# Patient Record
Sex: Female | Born: 1945 | Race: White | Hispanic: No | Marital: Married | Smoking: Current every day smoker
Health system: Southern US, Community
[De-identification: ages and names within clinical notes are randomized; demographics above are authoritative.]

## PROBLEM LIST (undated history)

## (undated) DIAGNOSIS — C50919 Malignant neoplasm of unspecified site of unspecified female breast: Secondary | ICD-10-CM

## (undated) DIAGNOSIS — F419 Anxiety disorder, unspecified: Secondary | ICD-10-CM

## (undated) DIAGNOSIS — I10 Essential (primary) hypertension: Secondary | ICD-10-CM

## (undated) DIAGNOSIS — R0600 Dyspnea, unspecified: Secondary | ICD-10-CM

## (undated) DIAGNOSIS — J449 Chronic obstructive pulmonary disease, unspecified: Secondary | ICD-10-CM

## (undated) DIAGNOSIS — C801 Malignant (primary) neoplasm, unspecified: Secondary | ICD-10-CM

## (undated) DIAGNOSIS — G8929 Other chronic pain: Secondary | ICD-10-CM

## (undated) DIAGNOSIS — M545 Low back pain, unspecified: Secondary | ICD-10-CM

## (undated) DIAGNOSIS — N189 Chronic kidney disease, unspecified: Secondary | ICD-10-CM

## (undated) DIAGNOSIS — M1990 Unspecified osteoarthritis, unspecified site: Secondary | ICD-10-CM

## (undated) DIAGNOSIS — E039 Hypothyroidism, unspecified: Secondary | ICD-10-CM

## (undated) DIAGNOSIS — Z8489 Family history of other specified conditions: Secondary | ICD-10-CM

## (undated) DIAGNOSIS — C4490 Unspecified malignant neoplasm of skin, unspecified: Secondary | ICD-10-CM

## (undated) DIAGNOSIS — E7800 Pure hypercholesterolemia, unspecified: Secondary | ICD-10-CM

## (undated) DIAGNOSIS — K219 Gastro-esophageal reflux disease without esophagitis: Secondary | ICD-10-CM

## (undated) HISTORY — PX: REPLACEMENT TOTAL HIP W/  RESURFACING IMPLANTS: SUR1222

## (undated) HISTORY — PX: CARPAL TUNNEL RELEASE: SHX101

## (undated) HISTORY — PX: TUBAL LIGATION: SHX77

## (undated) HISTORY — PX: JOINT REPLACEMENT: SHX530

## (undated) HISTORY — PX: ABDOMINAL HERNIA REPAIR: SHX539

## (undated) HISTORY — PX: ABDOMINAL HYSTERECTOMY: SHX81

## (undated) HISTORY — PX: BACK SURGERY: SHX140

## (undated) HISTORY — PX: HERNIA REPAIR: SHX51

## (undated) HISTORY — PX: DILATION AND CURETTAGE OF UTERUS: SHX78

## (undated) HISTORY — PX: SKIN CANCER EXCISION: SHX779

---

## 1993-08-19 HISTORY — PX: BREAST EXCISIONAL BIOPSY: SUR124

## 2005-06-05 ENCOUNTER — Ambulatory Visit: Payer: Self-pay | Admitting: Unknown Physician Specialty

## 2005-10-14 ENCOUNTER — Ambulatory Visit: Payer: Self-pay | Admitting: Unknown Physician Specialty

## 2006-06-09 ENCOUNTER — Ambulatory Visit: Payer: Self-pay | Admitting: Unknown Physician Specialty

## 2006-07-10 ENCOUNTER — Other Ambulatory Visit: Payer: Self-pay

## 2006-07-10 ENCOUNTER — Emergency Department: Payer: Self-pay | Admitting: Emergency Medicine

## 2006-07-11 ENCOUNTER — Ambulatory Visit: Payer: Self-pay | Admitting: Emergency Medicine

## 2007-04-30 ENCOUNTER — Ambulatory Visit: Payer: Self-pay | Admitting: Unknown Physician Specialty

## 2007-06-15 ENCOUNTER — Ambulatory Visit: Payer: Self-pay | Admitting: Unknown Physician Specialty

## 2008-07-27 ENCOUNTER — Ambulatory Visit: Payer: Self-pay | Admitting: Unknown Physician Specialty

## 2009-01-18 ENCOUNTER — Ambulatory Visit: Payer: Self-pay | Admitting: Internal Medicine

## 2009-01-20 ENCOUNTER — Ambulatory Visit: Payer: Self-pay | Admitting: Unknown Physician Specialty

## 2009-01-31 ENCOUNTER — Ambulatory Visit: Payer: Self-pay | Admitting: Unknown Physician Specialty

## 2009-08-22 ENCOUNTER — Ambulatory Visit: Payer: Self-pay | Admitting: Unknown Physician Specialty

## 2010-03-30 ENCOUNTER — Ambulatory Visit: Payer: Self-pay | Admitting: General Practice

## 2010-04-06 ENCOUNTER — Ambulatory Visit: Payer: Self-pay | Admitting: General Practice

## 2010-08-19 DIAGNOSIS — C50919 Malignant neoplasm of unspecified site of unspecified female breast: Secondary | ICD-10-CM

## 2010-08-19 HISTORY — PX: TUMOR EXCISION: SHX421

## 2010-08-19 HISTORY — PX: BREAST BIOPSY: SHX20

## 2010-08-19 HISTORY — DX: Malignant neoplasm of unspecified site of unspecified female breast: C50.919

## 2010-08-28 ENCOUNTER — Ambulatory Visit: Payer: Self-pay | Admitting: Unknown Physician Specialty

## 2010-09-10 ENCOUNTER — Ambulatory Visit: Payer: Self-pay | Admitting: Unknown Physician Specialty

## 2010-12-25 ENCOUNTER — Ambulatory Visit: Payer: Self-pay | Admitting: Internal Medicine

## 2011-01-03 ENCOUNTER — Inpatient Hospital Stay: Payer: Self-pay | Admitting: Surgery

## 2011-01-29 ENCOUNTER — Ambulatory Visit: Payer: Self-pay | Admitting: Unknown Physician Specialty

## 2011-02-01 ENCOUNTER — Inpatient Hospital Stay: Payer: Self-pay | Admitting: Surgery

## 2011-02-19 ENCOUNTER — Ambulatory Visit: Payer: Self-pay | Admitting: Surgery

## 2011-02-25 ENCOUNTER — Ambulatory Visit: Payer: Self-pay | Admitting: Oncology

## 2011-02-26 ENCOUNTER — Ambulatory Visit: Payer: Self-pay | Admitting: Surgery

## 2011-02-26 LAB — CANCER ANTIGEN 27.29: CA 27.29: 54.6 U/mL — ABNORMAL HIGH (ref 0.0–38.6)

## 2011-03-04 ENCOUNTER — Other Ambulatory Visit: Payer: Self-pay | Admitting: Oncology

## 2011-03-04 DIAGNOSIS — C799 Secondary malignant neoplasm of unspecified site: Secondary | ICD-10-CM

## 2011-03-04 DIAGNOSIS — R928 Other abnormal and inconclusive findings on diagnostic imaging of breast: Secondary | ICD-10-CM

## 2011-03-04 DIAGNOSIS — C50919 Malignant neoplasm of unspecified site of unspecified female breast: Secondary | ICD-10-CM

## 2011-03-05 ENCOUNTER — Ambulatory Visit: Payer: Self-pay | Admitting: Oncology

## 2011-03-08 ENCOUNTER — Ambulatory Visit
Admission: RE | Admit: 2011-03-08 | Discharge: 2011-03-08 | Disposition: A | Discharge status: Home / Self Care | Payer: BC Managed Care – PPO | Source: Ambulatory Visit | Attending: Oncology | Admitting: Oncology

## 2011-03-08 DIAGNOSIS — C799 Secondary malignant neoplasm of unspecified site: Secondary | ICD-10-CM

## 2011-03-08 DIAGNOSIS — R928 Other abnormal and inconclusive findings on diagnostic imaging of breast: Secondary | ICD-10-CM

## 2011-03-08 MED ORDER — GADOBENATE DIMEGLUMINE 529 MG/ML IV SOLN
15.0000 mL | Freq: Once | INTRAVENOUS | Status: AC | PRN
  Administered 2011-03-08: 15 mL via INTRAVENOUS

## 2011-03-20 ENCOUNTER — Ambulatory Visit: Payer: Self-pay | Admitting: Oncology

## 2011-03-28 ENCOUNTER — Ambulatory Visit: Payer: Self-pay | Admitting: Surgery

## 2011-04-04 LAB — PATHOLOGY REPORT

## 2011-04-20 ENCOUNTER — Ambulatory Visit: Payer: Self-pay | Admitting: Oncology

## 2011-05-02 LAB — CANCER ANTIGEN 27.29: CA 27.29: 34.7 U/mL (ref 0.0–38.6)

## 2011-05-20 ENCOUNTER — Ambulatory Visit: Payer: Self-pay | Admitting: Oncology

## 2011-06-20 ENCOUNTER — Ambulatory Visit: Payer: Self-pay | Admitting: Oncology

## 2011-06-28 LAB — CANCER ANTIGEN 27.29: CA 27.29: 23.4 U/mL (ref 0.0–38.6)

## 2011-07-20 ENCOUNTER — Ambulatory Visit: Payer: Self-pay | Admitting: Oncology

## 2011-07-26 LAB — CANCER ANTIGEN 27.29: CA 27.29: 30.2 U/mL (ref 0.0–38.6)

## 2011-08-20 ENCOUNTER — Ambulatory Visit: Payer: Self-pay | Admitting: Oncology

## 2011-08-22 LAB — BASIC METABOLIC PANEL
Anion Gap: 8 (ref 7–16)
BUN: 19 mg/dL — ABNORMAL HIGH (ref 7–18)
Chloride: 103 mmol/L (ref 98–107)
Creatinine: 1.12 mg/dL (ref 0.60–1.30)
EGFR (African American): >60
EGFR (Non-African Amer.): 52 — ABNORMAL LOW
Glucose: 97 mg/dL (ref 65–99)
Osmolality: 282 (ref 275–301)
Sodium: 140 mmol/L (ref 136–145)

## 2011-08-23 LAB — CANCER ANTIGEN 27.29: CA 27.29: 35.3 U/mL (ref 0.0–38.6)

## 2011-09-19 LAB — BASIC METABOLIC PANEL
Co2: 31 mmol/L (ref 21–32)
Creatinine: 1.21 mg/dL (ref 0.60–1.30)
EGFR (African American): 58 — ABNORMAL LOW
EGFR (Non-African Amer.): 47 — ABNORMAL LOW
Sodium: 140 mmol/L (ref 136–145)

## 2011-09-20 ENCOUNTER — Ambulatory Visit: Payer: Self-pay | Admitting: Oncology

## 2011-09-20 LAB — CANCER ANTIGEN 27.29: CA 27.29: 21.4 U/mL (ref 0.0–38.6)

## 2011-10-17 LAB — BASIC METABOLIC PANEL
BUN: 21 mg/dL — ABNORMAL HIGH (ref 7–18)
Calcium, Total: 9.2 mg/dL (ref 8.5–10.1)
EGFR (African American): 55 — ABNORMAL LOW
EGFR (Non-African Amer.): 46 — ABNORMAL LOW
Glucose: 110 mg/dL — ABNORMAL HIGH (ref 65–99)
Osmolality: 287 (ref 275–301)
Potassium: 3.8 mmol/L (ref 3.5–5.1)
Sodium: 142 mmol/L (ref 136–145)

## 2011-10-18 ENCOUNTER — Ambulatory Visit: Payer: Self-pay | Admitting: Oncology

## 2011-11-14 LAB — BASIC METABOLIC PANEL
BUN: 27 mg/dL — ABNORMAL HIGH (ref 7–18)
Calcium, Total: 9.3 mg/dL (ref 8.5–10.1)
Co2: 26 mmol/L (ref 21–32)
EGFR (African American): 55 — ABNORMAL LOW
Glucose: 131 mg/dL — ABNORMAL HIGH (ref 65–99)

## 2011-11-18 ENCOUNTER — Ambulatory Visit: Payer: Self-pay | Admitting: Oncology

## 2011-11-18 LAB — CANCER ANTIGEN 27.29: CA 27.29: 23.5 U/mL (ref 0.0–38.6)

## 2011-12-12 LAB — BASIC METABOLIC PANEL
Anion Gap: 10 (ref 7–16)
BUN: 17 mg/dL (ref 7–18)
Calcium, Total: 9.3 mg/dL (ref 8.5–10.1)
Chloride: 99 mmol/L (ref 98–107)
Co2: 29 mmol/L (ref 21–32)
Creatinine: 0.95 mg/dL (ref 0.60–1.30)
EGFR (African American): >60
EGFR (Non-African Amer.): >60
Glucose: 114 mg/dL — ABNORMAL HIGH (ref 65–99)
Osmolality: 278 (ref 275–301)
Potassium: 3.8 mmol/L (ref 3.5–5.1)
Sodium: 138 mmol/L (ref 136–145)

## 2011-12-18 ENCOUNTER — Ambulatory Visit: Payer: Self-pay | Admitting: Oncology

## 2011-12-18 ENCOUNTER — Ambulatory Visit: Payer: Self-pay

## 2012-01-09 LAB — BASIC METABOLIC PANEL
Chloride: 101 mmol/L (ref 98–107)
Co2: 31 mmol/L (ref 21–32)
Creatinine: 1.12 mg/dL (ref 0.60–1.30)
EGFR (African American): 60 — ABNORMAL LOW
EGFR (Non-African Amer.): 52 — ABNORMAL LOW
Glucose: 98 mg/dL (ref 65–99)
Osmolality: 282 (ref 275–301)
Sodium: 139 mmol/L (ref 136–145)

## 2012-01-10 LAB — CANCER ANTIGEN 27.29: CA 27.29: 16.9 U/mL (ref 0.0–38.6)

## 2012-01-18 ENCOUNTER — Ambulatory Visit: Payer: Self-pay | Admitting: Oncology

## 2012-02-06 LAB — BASIC METABOLIC PANEL
BUN: 20 mg/dL — ABNORMAL HIGH (ref 7–18)
Chloride: 102 mmol/L (ref 98–107)
EGFR (African American): 59 — ABNORMAL LOW
Glucose: 91 mg/dL (ref 65–99)
Potassium: 4.1 mmol/L (ref 3.5–5.1)
Sodium: 138 mmol/L (ref 136–145)

## 2012-02-07 LAB — CANCER ANTIGEN 27.29: CA 27.29: 19.3 U/mL (ref 0.0–38.6)

## 2012-02-17 ENCOUNTER — Ambulatory Visit: Payer: Self-pay | Admitting: Oncology

## 2012-03-05 LAB — BASIC METABOLIC PANEL WITH GFR
Anion Gap: 8
BUN: 27 mg/dL — ABNORMAL HIGH
Calcium, Total: 9.4 mg/dL
Chloride: 101 mmol/L
Co2: 32 mmol/L
Creatinine: 1.35 mg/dL — ABNORMAL HIGH
EGFR (African American): 48 — ABNORMAL LOW
EGFR (Non-African Amer.): 41 — ABNORMAL LOW
Glucose: 142 mg/dL — ABNORMAL HIGH
Osmolality: 289
Potassium: 3.4 mmol/L — ABNORMAL LOW
Sodium: 141 mmol/L

## 2012-03-06 LAB — CANCER ANTIGEN 27.29: CA 27.29: 21.5 U/mL (ref 0.0–38.6)

## 2012-03-11 ENCOUNTER — Other Ambulatory Visit: Payer: Self-pay | Admitting: Oncology

## 2012-03-11 DIAGNOSIS — C50911 Malignant neoplasm of unspecified site of right female breast: Secondary | ICD-10-CM

## 2012-03-12 ENCOUNTER — Ambulatory Visit: Payer: Self-pay | Admitting: Surgery

## 2012-03-12 DIAGNOSIS — I10 Essential (primary) hypertension: Secondary | ICD-10-CM

## 2012-03-12 LAB — CBC WITH DIFFERENTIAL/PLATELET
Basophil #: 0 10*3/uL (ref 0.0–0.1)
Eosinophil #: 0.2 10*3/uL (ref 0.0–0.7)
Eosinophil %: 2.7 %
Lymphocyte #: 1.5 10*3/uL (ref 1.0–3.6)
MCH: 31.2 pg (ref 26.0–34.0)
MCHC: 33.1 g/dL (ref 32.0–36.0)
MCV: 94 fL (ref 80–100)
Monocyte #: 0.4 x10 3/mm (ref 0.2–0.9)
Neutrophil %: 64.1 %
Platelet: 231 10*3/uL (ref 150–440)
RBC: 3.89 10*6/uL (ref 3.80–5.20)
RDW: 13 % (ref 11.5–14.5)

## 2012-03-12 LAB — BASIC METABOLIC PANEL
Anion Gap: 7 (ref 7–16)
Chloride: 106 mmol/L (ref 98–107)
Co2: 31 mmol/L (ref 21–32)
Creatinine: 1.04 mg/dL (ref 0.60–1.30)
EGFR (African American): >60
Glucose: 97 mg/dL (ref 65–99)
Osmolality: 291 (ref 275–301)
Sodium: 144 mmol/L (ref 136–145)

## 2012-03-15 ENCOUNTER — Other Ambulatory Visit: Payer: BC Managed Care – PPO

## 2012-03-19 ENCOUNTER — Ambulatory Visit: Payer: Self-pay | Admitting: Oncology

## 2012-03-20 ENCOUNTER — Ambulatory Visit: Payer: Self-pay | Admitting: Surgery

## 2012-03-21 LAB — BASIC METABOLIC PANEL
Anion Gap: 7 (ref 7–16)
Calcium, Total: 8.3 mg/dL — ABNORMAL LOW (ref 8.5–10.1)
Chloride: 103 mmol/L (ref 98–107)
Co2: 28 mmol/L (ref 21–32)
Creatinine: 1.22 mg/dL (ref 0.60–1.30)
EGFR (African American): 53 — ABNORMAL LOW
EGFR (Non-African Amer.): 46 — ABNORMAL LOW
Glucose: 131 mg/dL — ABNORMAL HIGH (ref 65–99)
Sodium: 138 mmol/L (ref 136–145)

## 2012-03-21 LAB — CBC WITH DIFFERENTIAL/PLATELET
Basophil %: 0.3 %
Eosinophil #: 0 10*3/uL (ref 0.0–0.7)
Eosinophil %: 0 %
HCT: 33.1 % — ABNORMAL LOW (ref 35.0–47.0)
HGB: 11.2 g/dL — ABNORMAL LOW (ref 12.0–16.0)
Lymphocyte #: 1.1 10*3/uL (ref 1.0–3.6)
MCHC: 33.8 g/dL (ref 32.0–36.0)
MCV: 93 fL (ref 80–100)
Monocyte #: 0.8 x10 3/mm (ref 0.2–0.9)
Neutrophil #: 9.8 10*3/uL — ABNORMAL HIGH (ref 1.4–6.5)
Neutrophil %: 83 %
RDW: 13.4 % (ref 11.5–14.5)
WBC: 11.9 10*3/uL — ABNORMAL HIGH (ref 3.6–11.0)

## 2012-03-22 LAB — CBC WITH DIFFERENTIAL/PLATELET
Basophil %: 0.2 %
Eosinophil %: 1.4 %
HCT: 29.6 % — ABNORMAL LOW (ref 35.0–47.0)
HGB: 10.1 g/dL — ABNORMAL LOW (ref 12.0–16.0)
Lymphocyte %: 10.6 %
MCV: 94 fL (ref 80–100)
Monocyte %: 6.7 %
Neutrophil #: 8.4 10*3/uL — ABNORMAL HIGH (ref 1.4–6.5)
Neutrophil %: 81.1 %
RBC: 3.15 10*6/uL — ABNORMAL LOW (ref 3.80–5.20)
RDW: 13.6 % (ref 11.5–14.5)

## 2012-04-09 ENCOUNTER — Ambulatory Visit
Admission: RE | Admit: 2012-04-09 | Discharge: 2012-04-09 | Disposition: A | Discharge status: Home / Self Care | Payer: Medicare Other | Source: Ambulatory Visit | Attending: Oncology | Admitting: Oncology

## 2012-04-09 DIAGNOSIS — C50911 Malignant neoplasm of unspecified site of right female breast: Secondary | ICD-10-CM

## 2012-04-09 MED ORDER — GADOBENATE DIMEGLUMINE 529 MG/ML IV SOLN
7.0000 mL | Freq: Once | INTRAVENOUS | Status: AC | PRN
  Administered 2012-04-09: 7 mL via INTRAVENOUS

## 2012-04-30 ENCOUNTER — Ambulatory Visit: Payer: Self-pay | Admitting: Oncology

## 2012-04-30 LAB — BASIC METABOLIC PANEL
Anion Gap: 6 — ABNORMAL LOW (ref 7–16)
Calcium, Total: 9.2 mg/dL (ref 8.5–10.1)
Chloride: 102 mmol/L (ref 98–107)
Co2: 31 mmol/L (ref 21–32)
Osmolality: 281 (ref 275–301)
Sodium: 139 mmol/L (ref 136–145)

## 2012-05-19 ENCOUNTER — Ambulatory Visit: Payer: Self-pay | Admitting: Oncology

## 2012-05-28 LAB — BASIC METABOLIC PANEL
Calcium, Total: 9 mg/dL (ref 8.5–10.1)
Chloride: 102 mmol/L (ref 98–107)
Co2: 26 mmol/L (ref 21–32)
Creatinine: 1.32 mg/dL — ABNORMAL HIGH (ref 0.60–1.30)
EGFR (African American): 49 — ABNORMAL LOW
Glucose: 108 mg/dL — ABNORMAL HIGH (ref 65–99)
Sodium: 139 mmol/L (ref 136–145)

## 2012-05-29 LAB — CANCER ANTIGEN 27.29: CA 27.29: 22.4 U/mL (ref 0.0–38.6)

## 2012-06-19 ENCOUNTER — Ambulatory Visit: Payer: Self-pay | Admitting: Oncology

## 2012-06-25 LAB — BASIC METABOLIC PANEL
BUN: 19 mg/dL — ABNORMAL HIGH (ref 7–18)
Creatinine: 1.36 mg/dL — ABNORMAL HIGH (ref 0.60–1.30)
EGFR (African American): 47 — ABNORMAL LOW
EGFR (Non-African Amer.): 40 — ABNORMAL LOW
Osmolality: 277 (ref 275–301)
Potassium: 3.8 mmol/L (ref 3.5–5.1)
Sodium: 136 mmol/L (ref 136–145)

## 2012-07-19 ENCOUNTER — Ambulatory Visit: Payer: Self-pay | Admitting: Oncology

## 2012-07-23 LAB — BASIC METABOLIC PANEL
BUN: 19 mg/dL — ABNORMAL HIGH (ref 7–18)
Chloride: 102 mmol/L (ref 98–107)
Co2: 30 mmol/L (ref 21–32)
Creatinine: 1.33 mg/dL — ABNORMAL HIGH (ref 0.60–1.30)
EGFR (Non-African Amer.): 42 — ABNORMAL LOW
Osmolality: 282 (ref 275–301)
Potassium: 4 mmol/L (ref 3.5–5.1)
Sodium: 140 mmol/L (ref 136–145)

## 2012-07-24 LAB — CANCER ANTIGEN 27.29: CA 27.29: 24.5 U/mL (ref 0.0–38.6)

## 2012-08-19 ENCOUNTER — Ambulatory Visit: Payer: Self-pay | Admitting: Oncology

## 2012-08-19 HISTORY — PX: BREAST BIOPSY: SHX20

## 2012-08-20 LAB — BASIC METABOLIC PANEL
BUN: 22 mg/dL — ABNORMAL HIGH (ref 7–18)
Calcium, Total: 8.6 mg/dL (ref 8.5–10.1)
Chloride: 103 mmol/L (ref 98–107)
Creatinine: 1.31 mg/dL — ABNORMAL HIGH (ref 0.60–1.30)
EGFR (African American): 49 — ABNORMAL LOW
Glucose: 101 mg/dL — ABNORMAL HIGH (ref 65–99)
Potassium: 4 mmol/L (ref 3.5–5.1)

## 2012-08-21 LAB — CANCER ANTIGEN 27.29: CA 27.29: 20.6 U/mL (ref 0.0–38.6)

## 2012-09-17 LAB — BASIC METABOLIC PANEL
BUN: 23 mg/dL — ABNORMAL HIGH (ref 7–18)
Calcium, Total: 9.1 mg/dL (ref 8.5–10.1)
Chloride: 101 mmol/L (ref 98–107)
Creatinine: 1.34 mg/dL — ABNORMAL HIGH (ref 0.60–1.30)
EGFR (Non-African Amer.): 41 — ABNORMAL LOW

## 2012-09-19 ENCOUNTER — Ambulatory Visit: Payer: Self-pay | Admitting: Oncology

## 2012-10-15 LAB — BASIC METABOLIC PANEL
Anion Gap: 9 (ref 7–16)
BUN: 27 mg/dL — ABNORMAL HIGH (ref 7–18)
Calcium, Total: 9.3 mg/dL (ref 8.5–10.1)
Chloride: 102 mmol/L (ref 98–107)
Co2: 29 mmol/L (ref 21–32)
EGFR (African American): 44 — ABNORMAL LOW
EGFR (Non-African Amer.): 38 — ABNORMAL LOW
Potassium: 4.2 mmol/L (ref 3.5–5.1)
Sodium: 140 mmol/L (ref 136–145)

## 2012-10-16 LAB — CANCER ANTIGEN 27.29: CA 27.29: 18.7 U/mL (ref 0.0–38.6)

## 2012-10-17 ENCOUNTER — Ambulatory Visit: Payer: Self-pay | Admitting: Oncology

## 2012-11-12 LAB — BASIC METABOLIC PANEL
Anion Gap: 5 — ABNORMAL LOW (ref 7–16)
BUN: 25 mg/dL — ABNORMAL HIGH (ref 7–18)
Calcium, Total: 8.7 mg/dL (ref 8.5–10.1)
Co2: 32 mmol/L (ref 21–32)
Creatinine: 1.47 mg/dL — ABNORMAL HIGH (ref 0.60–1.30)
EGFR (Non-African Amer.): 37 — ABNORMAL LOW
Potassium: 3.7 mmol/L (ref 3.5–5.1)

## 2012-11-13 LAB — CANCER ANTIGEN 27.29: CA 27.29: 20.3 U/mL (ref 0.0–38.6)

## 2012-11-17 ENCOUNTER — Ambulatory Visit: Payer: Self-pay | Admitting: Oncology

## 2012-12-17 ENCOUNTER — Ambulatory Visit: Payer: Self-pay | Admitting: Oncology

## 2012-12-17 LAB — BASIC METABOLIC PANEL
Anion Gap: 11 (ref 7–16)
Chloride: 101 mmol/L (ref 98–107)
Co2: 29 mmol/L (ref 21–32)
EGFR (African American): 45 — ABNORMAL LOW
EGFR (Non-African Amer.): 39 — ABNORMAL LOW
Potassium: 3.6 mmol/L (ref 3.5–5.1)
Sodium: 141 mmol/L (ref 136–145)

## 2012-12-18 LAB — CANCER ANTIGEN 27.29: CA 27.29: 19.2 U/mL (ref 0.0–38.6)

## 2013-01-17 ENCOUNTER — Ambulatory Visit: Payer: Self-pay | Admitting: Oncology

## 2013-01-21 LAB — BASIC METABOLIC PANEL
Anion Gap: 11 (ref 7–16)
BUN: 28 mg/dL — ABNORMAL HIGH (ref 7–18)
Calcium, Total: 9.7 mg/dL (ref 8.5–10.1)
Co2: 28 mmol/L (ref 21–32)
EGFR (Non-African Amer.): 39 — ABNORMAL LOW
Osmolality: 282 (ref 275–301)
Potassium: 4.2 mmol/L (ref 3.5–5.1)
Sodium: 138 mmol/L (ref 136–145)

## 2013-01-22 LAB — CANCER ANTIGEN 27.29: CA 27.29: 25.6 U/mL (ref 0.0–38.6)

## 2013-02-16 ENCOUNTER — Ambulatory Visit: Payer: Self-pay | Admitting: Oncology

## 2013-02-18 LAB — BASIC METABOLIC PANEL
BUN: 25 mg/dL — ABNORMAL HIGH (ref 7–18)
Calcium, Total: 9.2 mg/dL (ref 8.5–10.1)
Chloride: 103 mmol/L (ref 98–107)
Co2: 31 mmol/L (ref 21–32)
EGFR (Non-African Amer.): 37 — ABNORMAL LOW
Glucose: 108 mg/dL — ABNORMAL HIGH (ref 65–99)
Osmolality: 286 (ref 275–301)
Potassium: 3.8 mmol/L (ref 3.5–5.1)

## 2013-03-19 ENCOUNTER — Ambulatory Visit: Payer: Self-pay | Admitting: Oncology

## 2013-05-03 ENCOUNTER — Ambulatory Visit: Payer: Self-pay | Admitting: Surgery

## 2013-05-18 ENCOUNTER — Ambulatory Visit: Payer: Self-pay | Admitting: Oncology

## 2013-05-18 LAB — BASIC METABOLIC PANEL
Anion Gap: 10 (ref 7–16)
BUN: 22 mg/dL — ABNORMAL HIGH (ref 7–18)
Calcium, Total: 8.6 mg/dL (ref 8.5–10.1)
Co2: 30 mmol/L (ref 21–32)
Creatinine: 1.36 mg/dL — ABNORMAL HIGH (ref 0.60–1.30)
EGFR (Non-African Amer.): 40 — ABNORMAL LOW
Glucose: 141 mg/dL — ABNORMAL HIGH (ref 65–99)
Sodium: 143 mmol/L (ref 136–145)

## 2013-05-19 ENCOUNTER — Ambulatory Visit: Payer: Self-pay | Admitting: Oncology

## 2013-08-17 ENCOUNTER — Ambulatory Visit: Payer: Self-pay | Admitting: Oncology

## 2013-08-17 LAB — BASIC METABOLIC PANEL
Anion Gap: 5 — ABNORMAL LOW (ref 7–16)
Calcium, Total: 9.2 mg/dL (ref 8.5–10.1)
Chloride: 101 mmol/L (ref 98–107)
EGFR (African American): 54 — ABNORMAL LOW
EGFR (Non-African Amer.): 46 — ABNORMAL LOW
Glucose: 96 mg/dL (ref 65–99)
Potassium: 3.9 mmol/L (ref 3.5–5.1)

## 2013-08-19 ENCOUNTER — Ambulatory Visit: Payer: Self-pay | Admitting: Oncology

## 2013-11-10 ENCOUNTER — Ambulatory Visit: Payer: Self-pay | Admitting: Oncology

## 2013-11-12 ENCOUNTER — Ambulatory Visit: Payer: Self-pay | Admitting: Oncology

## 2013-11-15 ENCOUNTER — Ambulatory Visit: Payer: Self-pay | Admitting: Oncology

## 2013-11-15 LAB — BASIC METABOLIC PANEL
ANION GAP: 11 (ref 7–16)
BUN: 18 mg/dL (ref 7–18)
CALCIUM: 9.5 mg/dL (ref 8.5–10.1)
CO2: 29 mmol/L (ref 21–32)
Chloride: 98 mmol/L (ref 98–107)
Creatinine: 1.34 mg/dL — ABNORMAL HIGH (ref 0.60–1.30)
EGFR (African American): 47 — ABNORMAL LOW
GFR CALC NON AF AMER: 41 — AB
GLUCOSE: 134 mg/dL — AB (ref 65–99)
Osmolality: 280 (ref 275–301)
Potassium: 3.5 mmol/L (ref 3.5–5.1)
SODIUM: 138 mmol/L (ref 136–145)

## 2013-11-16 LAB — CANCER ANTIGEN 27.29: CA 27.29: 23.2 U/mL (ref 0.0–38.6)

## 2013-11-17 ENCOUNTER — Ambulatory Visit: Payer: Self-pay | Admitting: Oncology

## 2014-01-26 DIAGNOSIS — J42 Unspecified chronic bronchitis: Secondary | ICD-10-CM | POA: Insufficient documentation

## 2014-02-15 ENCOUNTER — Ambulatory Visit: Payer: Self-pay | Admitting: Oncology

## 2014-02-16 ENCOUNTER — Ambulatory Visit: Payer: Self-pay | Admitting: Oncology

## 2014-02-16 LAB — CANCER ANTIGEN 27.29: CA 27.29: 24.5 U/mL (ref 0.0–38.6)

## 2014-05-19 ENCOUNTER — Ambulatory Visit: Payer: Self-pay | Admitting: Oncology

## 2014-05-20 LAB — CANCER ANTIGEN 27.29: CA 27.29: 21.2 U/mL (ref 0.0–38.6)

## 2014-05-30 DIAGNOSIS — Z780 Asymptomatic menopausal state: Secondary | ICD-10-CM | POA: Insufficient documentation

## 2014-05-30 DIAGNOSIS — I7300 Raynaud's syndrome without gangrene: Secondary | ICD-10-CM | POA: Insufficient documentation

## 2014-05-30 DIAGNOSIS — N951 Menopausal and female climacteric states: Secondary | ICD-10-CM | POA: Insufficient documentation

## 2014-05-30 DIAGNOSIS — N6019 Diffuse cystic mastopathy of unspecified breast: Secondary | ICD-10-CM | POA: Insufficient documentation

## 2014-06-19 ENCOUNTER — Ambulatory Visit: Payer: Self-pay | Admitting: Oncology

## 2014-07-26 ENCOUNTER — Ambulatory Visit: Payer: Self-pay | Admitting: Oncology

## 2014-08-19 HISTORY — PX: LAPAROSCOPIC CHOLECYSTECTOMY: SUR755

## 2014-09-21 ENCOUNTER — Ambulatory Visit: Payer: Self-pay | Admitting: Physician Assistant

## 2014-09-23 ENCOUNTER — Ambulatory Visit: Payer: Self-pay | Admitting: Physician Assistant

## 2014-10-25 ENCOUNTER — Ambulatory Visit: Payer: Self-pay | Admitting: Anesthesiology

## 2014-10-27 ENCOUNTER — Ambulatory Visit: Payer: Self-pay | Admitting: Surgery

## 2014-11-21 ENCOUNTER — Ambulatory Visit
Admit: 2014-11-21 | Disposition: A | Discharge status: Home / Self Care | Payer: Self-pay | Attending: Oncology | Admitting: Oncology

## 2014-11-22 ENCOUNTER — Ambulatory Visit
Admit: 2014-11-22 | Disposition: A | Discharge status: Home / Self Care | Payer: Self-pay | Attending: Oncology | Admitting: Oncology

## 2014-11-23 LAB — CANCER ANTIGEN 27.29: CA 27.29: 27.1 U/mL (ref 0.0–38.6)

## 2014-12-06 NOTE — Discharge Summary (Signed)
PATIENT NAME:  Sara David, Sara David MR#:  [id number] DATE OF BIRTH:  [birth year]  DATE OF ADMISSION:  03/20/2012 DATE OF DISCHARGE:  03/22/2012  BRIEF HISTORY: Ms. Dawna Jakes is a 69 year old woman with a large ventral hernia admitted for elective ventral hernia repair. After appropriate preoperative preparation and informed consent, she was taken to surgery on the morning of 03/20/2012 where she underwent ventral hernia repair using atrium C-Qur mesh and component mobilization procedure. JP drains were placed. She had significant pain control problems initially after surgery, difficulty getting up. Abdominal binder was utilized. Her diet was advanced over the 3rd and this morning she is tolerating a regular diet. Dressing was changed. Her wounds look good. There is no sign of infection. She does continue to have moderate JP drainage. Will discharge her home today to be followed in the office in 7 to 10 days' time to re-evaluate JP removal.   DISCHARGE MEDICATIONS:  1. Letrozole 2.5 mg p.o. daily.  2. Omeprazole 20 mg p.o. daily.  3. Singulair 10 mg p.o. daily.  4. Citalopram 20 mg p.o. daily.  5. Diovan 80 mg p.o. daily.  6. Hydrochlorothiazide 12.5 mg p.o. daily.  7. Synthroid 100 mcg p.o. daily.  8. MiraLAX one packet p.o. daily.  9. Pravastatin 40 mg p.o. daily.  10. Zyrtec 10 mg p.o. daily p.r.n.  11. Flonase 50 mcg two sprays daily p.r.n.  12. Advair Diskus 100 mcg/50 mcg inhaled once a day.  13. Ambien 10 mg p.o. at bedtime p.r.n.   ADDITIONAL MEDICATIONS:  1. NicoDerm 14 mg patch daily.  2. Percocet 5/325 p.o. q.6 hours p.r.n. pain.   FINAL DISCHARGE DIAGNOSIS: Ventral hernia.    SURGERY: Ventral hernia repair.   ____________________________ Micheline Maze, MD rle:drc D: 03/22/2012 10:05:34 ET T: 03/23/2012 10:30:25 ET JOB#: [id number]  cc: Micheline Maze, MD, <Dictator> Paulita Cradle, PA-C Rodena Goldmann MD ELECTRONICALLY SIGNED 03/26/2012 22:33

## 2014-12-06 NOTE — Op Note (Signed)
PATIENT NAME:  Sara David, Sara David MR#:  [id number] DATE OF BIRTH:  1946/05/31  DATE OF PROCEDURE:  03/20/2012  PREOPERATIVE DIAGNOSIS: Ventral hernia.   POSTOPERATIVE DIAGNOSIS: Ventral hernia.   OPERATION: Ventral hernia repair.   SURGEON: Rodena Goldmann, MD.   ANESTHESIA: General.   OPERATIVE PROCEDURE: With the patient in the supine position after the induction of appropriate general anesthesia, the patient's abdomen was prepped with ChloraPrep and draped with sterile towels. The previous skin incision was excised in the upper midline and carried down to the umbilicus. The incision was carried down through the subcutaneous tissue using Bovie electrocautery. Midline hernia sac was identified and opened the length of the skin incision after dissecting it back to its base in the fascia. There were multiple smaller defects in the superior portion of the incision. The sac was dissected back to its base in a circumferential fashion and then the fascia dissected back to the division laterally. The sac was excised at the edge of the fascial defect removing the sac completely. Multiple adhesions were taken down to expose free intraperitoneal space. A piece of Gore-Tex Dual Mesh Plus was brought to the table and inserted into the defect and the repair began. However, it was apparent that the mesh was not going to completely encompass the defect and so that piece of mesh was removed and replaced with a 20 x 25 cm piece of atrium C-Qur mesh. The mesh was placed in the appropriate position and sutured in place with vertical mattress sutures of 0 Prolene. The underlay was approximately 5 to 6 cm in circumferential fashion. The fascial layer was then closed over the mesh using 0 Prolene in an interrupted figure-of-eight fashion. 10 mm Jackson-Pratt drains were inserted into the dead space secured with 3-0 nylon. The skin was clipped. Sterile compressive dressing was applied as well as an abdominal binder. The patient was  returned to the recovery room having tolerated the procedure well. Sponge, instrument, and needle counts were correct x2 in the Operating Room.  ____________________________ Rodena Goldmann III, MD rle:ap D: 03/20/2012 11:06:05 ET               T: 03/20/2012 12:24:23 ET             JOB#: [id number] cc: Rodena Goldmann III, MD, <Dictator> Rodena Goldmann MD ELECTRONICALLY SIGNED 03/20/2012 18:42

## 2014-12-09 ENCOUNTER — Ambulatory Visit
Admit: 2014-12-09 | Disposition: A | Discharge status: Home / Self Care | Payer: Self-pay | Attending: Unknown Physician Specialty | Admitting: Unknown Physician Specialty

## 2014-12-12 LAB — SURGICAL PATHOLOGY

## 2014-12-18 NOTE — Op Note (Signed)
PATIENT NAME:  Sara David, Sara David MR#:  [id number] DATE OF BIRTH:  Oct 01, 1945  DATE OF PROCEDURE:  10/27/2014  PREOPERATIVE DIAGNOSIS: Chronic cholecystitis.   POSTOPERATIVE DIAGNOSIS: Chronic cholecystitis.  OPERATION: Robotic-assisted laparoscopic cholecystectomy.   SURGEON: Rodena Goldmann III, MD   ANESTHESIA: General.   OPERATIVE PROCEDURE: With the patient in the supine position after the induction of appropriate general anesthesia, the patient's abdomen was prepped with ChloraPrep and draped with sterile towels. The patient was placed in the head down, feet up position. A left lateral transverse incision was made and the abdomen was cannulated using the Optiview cannula. Intraperitoneal position was confirmed. CO2 was reinsufflated. That approach was taken because of the midline ventral hernia and the possibility of significant midline scarring. I was able to visualize the gallbladder. A left upper quadrant transverse incision was made just off the costal margin and a robot port inserted in that area. A right lower quadrant transverse incision was made and the robot camera port inserted. A right anterior iliac spine incision was made and another robot port inserted under direct vision and an assistant port inserted high on the abdominal wall. The robot was brought to the table, docked to the patient, instruments inserted under direct vision. I then moved to the console. The gallbladder was visualized. The instruments were able to reach the fundus without too much trouble. I was able to visualize the hepatoduodenal ligament. The cystic duct was visualized and carefully dissected free from the surrounding tissue. It was doubly clipped and divided. The artery was individually clipped and divided. The gallbladder was then dissected free of its bed and delivered using hook and cautery apparatus. Once the gallbladder was free, the robot was undocked. The gallbladder removed through the lateral 10 mm port under  direct vision. The area was copiously suction irrigated. All ports were withdrawn without difficulty. Skin incision was closed with 5-0 nylon. The area was infiltrated with 0.25% Marcaine for postoperative pain control. Sterile dressings were applied. The patient was returned to the recovery room, having tolerated the procedure well. Sponge, instrument, and needle counts were correct x 2 in the operating room.   ____________________________ Rodena Goldmann III, MD rle:ST D: 10/27/2014 14:11:44 ET T: 10/27/2014 23:11:29 ET JOB#: [id number]  cc: Rodena Goldmann III, MD, <Dictator> Rodena Goldmann MD ELECTRONICALLY SIGNED 10/28/2014 19:27

## 2014-12-21 ENCOUNTER — Other Ambulatory Visit: Payer: Self-pay | Admitting: Family Medicine

## 2014-12-21 DIAGNOSIS — Z1231 Encounter for screening mammogram for malignant neoplasm of breast: Secondary | ICD-10-CM

## 2015-01-10 ENCOUNTER — Ambulatory Visit: Payer: Self-pay | Attending: Family Medicine

## 2015-01-30 ENCOUNTER — Encounter: Payer: Self-pay | Admitting: Emergency Medicine

## 2015-01-30 ENCOUNTER — Inpatient Hospital Stay
Admission: EM | Admit: 2015-01-30 | Discharge: 2015-02-02 | DRG: 389 | Disposition: A | Discharge status: Home / Self Care | Payer: Medicare Other | Attending: Internal Medicine | Admitting: Internal Medicine

## 2015-01-30 ENCOUNTER — Emergency Department: Payer: Medicare Other

## 2015-01-30 DIAGNOSIS — H6090 Unspecified otitis externa, unspecified ear: Secondary | ICD-10-CM | POA: Diagnosis present

## 2015-01-30 DIAGNOSIS — Z9049 Acquired absence of other specified parts of digestive tract: Secondary | ICD-10-CM | POA: Diagnosis present

## 2015-01-30 DIAGNOSIS — K5660 Unspecified intestinal obstruction: Secondary | ICD-10-CM | POA: Diagnosis present

## 2015-01-30 DIAGNOSIS — E162 Hypoglycemia, unspecified: Secondary | ICD-10-CM | POA: Diagnosis present

## 2015-01-30 DIAGNOSIS — K589 Irritable bowel syndrome without diarrhea: Secondary | ICD-10-CM | POA: Diagnosis present

## 2015-01-30 DIAGNOSIS — I739 Peripheral vascular disease, unspecified: Secondary | ICD-10-CM | POA: Diagnosis present

## 2015-01-30 DIAGNOSIS — C50911 Malignant neoplasm of unspecified site of right female breast: Secondary | ICD-10-CM | POA: Diagnosis not present

## 2015-01-30 DIAGNOSIS — K56609 Unspecified intestinal obstruction, unspecified as to partial versus complete obstruction: Secondary | ICD-10-CM | POA: Diagnosis present

## 2015-01-30 DIAGNOSIS — E860 Dehydration: Secondary | ICD-10-CM | POA: Diagnosis present

## 2015-01-30 DIAGNOSIS — C50919 Malignant neoplasm of unspecified site of unspecified female breast: Secondary | ICD-10-CM | POA: Diagnosis not present

## 2015-01-30 DIAGNOSIS — F329 Major depressive disorder, single episode, unspecified: Secondary | ICD-10-CM | POA: Diagnosis present

## 2015-01-30 DIAGNOSIS — K5669 Other intestinal obstruction: Secondary | ICD-10-CM | POA: Diagnosis present

## 2015-01-30 DIAGNOSIS — E538 Deficiency of other specified B group vitamins: Secondary | ICD-10-CM | POA: Diagnosis present

## 2015-01-30 DIAGNOSIS — Z91040 Latex allergy status: Secondary | ICD-10-CM | POA: Diagnosis not present

## 2015-01-30 DIAGNOSIS — C784 Secondary malignant neoplasm of small intestine: Secondary | ICD-10-CM | POA: Diagnosis present

## 2015-01-30 DIAGNOSIS — M549 Dorsalgia, unspecified: Secondary | ICD-10-CM

## 2015-01-30 DIAGNOSIS — E039 Hypothyroidism, unspecified: Secondary | ICD-10-CM | POA: Diagnosis present

## 2015-01-30 DIAGNOSIS — J45909 Unspecified asthma, uncomplicated: Secondary | ICD-10-CM | POA: Diagnosis present

## 2015-01-30 DIAGNOSIS — R079 Chest pain, unspecified: Secondary | ICD-10-CM

## 2015-01-30 DIAGNOSIS — K219 Gastro-esophageal reflux disease without esophagitis: Secondary | ICD-10-CM | POA: Diagnosis present

## 2015-01-30 DIAGNOSIS — Z8379 Family history of other diseases of the digestive system: Secondary | ICD-10-CM

## 2015-01-30 DIAGNOSIS — Z888 Allergy status to other drugs, medicaments and biological substances status: Secondary | ICD-10-CM

## 2015-01-30 DIAGNOSIS — F17210 Nicotine dependence, cigarettes, uncomplicated: Secondary | ICD-10-CM | POA: Diagnosis present

## 2015-01-30 DIAGNOSIS — Z7951 Long term (current) use of inhaled steroids: Secondary | ICD-10-CM

## 2015-01-30 DIAGNOSIS — Z79899 Other long term (current) drug therapy: Secondary | ICD-10-CM

## 2015-01-30 DIAGNOSIS — Z4659 Encounter for fitting and adjustment of other gastrointestinal appliance and device: Secondary | ICD-10-CM

## 2015-01-30 DIAGNOSIS — E785 Hyperlipidemia, unspecified: Secondary | ICD-10-CM | POA: Diagnosis present

## 2015-01-30 DIAGNOSIS — I10 Essential (primary) hypertension: Secondary | ICD-10-CM | POA: Diagnosis present

## 2015-01-30 DIAGNOSIS — K565 Intestinal adhesions [bands] with obstruction (postprocedural) (postinfection): Secondary | ICD-10-CM | POA: Diagnosis not present

## 2015-01-30 DIAGNOSIS — Z9889 Other specified postprocedural states: Secondary | ICD-10-CM | POA: Diagnosis not present

## 2015-01-30 DIAGNOSIS — Z803 Family history of malignant neoplasm of breast: Secondary | ICD-10-CM

## 2015-01-30 HISTORY — DX: Malignant (primary) neoplasm, unspecified: C80.1

## 2015-01-30 HISTORY — DX: Essential (primary) hypertension: I10

## 2015-01-30 HISTORY — DX: Malignant neoplasm of unspecified site of unspecified female breast: C50.919

## 2015-01-30 LAB — CBC WITH DIFFERENTIAL/PLATELET
BASOS ABS: 0 10*3/uL (ref 0–0.1)
BASOS PCT: 0 %
EOS PCT: 1 %
Eosinophils Absolute: 0.1 10*3/uL (ref 0–0.7)
HEMATOCRIT: 42.3 % (ref 35.0–47.0)
Hemoglobin: 14.1 g/dL (ref 12.0–16.0)
Lymphocytes Relative: 18 %
Lymphs Abs: 1.8 10*3/uL (ref 1.0–3.6)
MCH: 31 pg (ref 26.0–34.0)
MCHC: 33.3 g/dL (ref 32.0–36.0)
MCV: 93.1 fL (ref 80.0–100.0)
MONO ABS: 0.6 10*3/uL (ref 0.2–0.9)
Monocytes Relative: 6 %
Neutro Abs: 7.9 10*3/uL — ABNORMAL HIGH (ref 1.4–6.5)
Neutrophils Relative %: 75 %
Platelets: 298 10*3/uL (ref 150–440)
RBC: 4.54 MIL/uL (ref 3.80–5.20)
RDW: 13.6 % (ref 11.5–14.5)
WBC: 10.4 10*3/uL (ref 3.6–11.0)

## 2015-01-30 LAB — COMPREHENSIVE METABOLIC PANEL
ALT: 18 U/L (ref 14–54)
AST: 24 U/L (ref 15–41)
Albumin: 4.7 g/dL (ref 3.5–5.0)
Alkaline Phosphatase: 80 U/L (ref 38–126)
Anion gap: 14 (ref 5–15)
BUN: 26 mg/dL — ABNORMAL HIGH (ref 6–20)
CALCIUM: 10.1 mg/dL (ref 8.9–10.3)
CO2: 24 mmol/L (ref 22–32)
CREATININE: 1.1 mg/dL — AB (ref 0.44–1.00)
Chloride: 98 mmol/L — ABNORMAL LOW (ref 101–111)
GFR calc Af Amer: 58 mL/min — ABNORMAL LOW (ref >60)
GFR calc non Af Amer: 50 mL/min — ABNORMAL LOW (ref >60)
Glucose, Bld: 103 mg/dL — ABNORMAL HIGH (ref 65–99)
Potassium: 3.9 mmol/L (ref 3.5–5.1)
Sodium: 136 mmol/L (ref 135–145)
Total Bilirubin: 0.6 mg/dL (ref 0.3–1.2)
Total Protein: 8.2 g/dL — ABNORMAL HIGH (ref 6.5–8.1)

## 2015-01-30 LAB — URINALYSIS COMPLETE WITH MICROSCOPIC (ARMC ONLY)
Bacteria, UA: NONE SEEN
Bilirubin Urine: NEGATIVE
Glucose, UA: NEGATIVE mg/dL
Hgb urine dipstick: NEGATIVE
Nitrite: NEGATIVE
PROTEIN: NEGATIVE mg/dL
Specific Gravity, Urine: 1.009 (ref 1.005–1.030)
pH: 5 (ref 5.0–8.0)

## 2015-01-30 LAB — TROPONIN I
Troponin I: <0.03 ng/mL (ref <0.031)
Troponin I: <0.03 ng/mL (ref <0.031)
Troponin I: <0.03 ng/mL (ref <0.031)

## 2015-01-30 LAB — LIPASE, BLOOD: LIPASE: 34 U/L (ref 22–51)

## 2015-01-30 MED ORDER — POTASSIUM CHLORIDE IN NACL 20-0.9 MEQ/L-% IV SOLN
INTRAVENOUS | Status: DC
  Administered 2015-01-30 – 2015-02-01 (×5): via INTRAVENOUS
  Filled 2015-01-30 (×9): qty 1000

## 2015-01-30 MED ORDER — FENTANYL CITRATE (PF) 100 MCG/2ML IJ SOLN
50.0000 ug | Freq: Once | INTRAMUSCULAR | Status: AC
  Administered 2015-01-30: 50 ug via INTRAVENOUS

## 2015-01-30 MED ORDER — HEPARIN SODIUM (PORCINE) 5000 UNIT/ML IJ SOLN
5000.0000 [IU] | Freq: Three times a day (TID) | INTRAMUSCULAR | Status: DC
  Administered 2015-01-30 – 2015-02-02 (×8): 5000 [IU] via SUBCUTANEOUS
  Filled 2015-01-30 (×8): qty 1

## 2015-01-30 MED ORDER — FENTANYL CITRATE (PF) 100 MCG/2ML IJ SOLN
INTRAMUSCULAR | Status: AC
  Filled 2015-01-30: qty 2

## 2015-01-30 MED ORDER — NICOTINE 21 MG/24HR TD PT24
21.0000 mg | MEDICATED_PATCH | Freq: Every day | TRANSDERMAL | Status: DC
  Administered 2015-01-30 – 2015-02-02 (×4): 21 mg via TRANSDERMAL
  Filled 2015-01-30 (×4): qty 1

## 2015-01-30 MED ORDER — FENTANYL CITRATE (PF) 100 MCG/2ML IJ SOLN
75.0000 ug | Freq: Once | INTRAMUSCULAR | Status: AC
  Administered 2015-01-30: 75 ug via INTRAVENOUS

## 2015-01-30 MED ORDER — LETROZOLE 2.5 MG PO TABS
2.5000 mg | ORAL_TABLET | Freq: Every day | ORAL | Status: DC
  Administered 2015-01-31 – 2015-02-02 (×3): 2.5 mg via ORAL
  Filled 2015-01-30 (×4): qty 1

## 2015-01-30 MED ORDER — ONDANSETRON HCL 4 MG/2ML IJ SOLN
INTRAMUSCULAR | Status: AC
  Administered 2015-01-30: 4 mg via INTRAVENOUS
  Filled 2015-01-30: qty 2

## 2015-01-30 MED ORDER — CYCLOBENZAPRINE HCL 10 MG PO TABS: 10.0000 mg | ORAL_TABLET | Freq: Three times a day (TID) | ORAL | Status: DC | PRN

## 2015-01-30 MED ORDER — ONDANSETRON HCL 4 MG/2ML IJ SOLN
4.0000 mg | Freq: Once | INTRAMUSCULAR | Status: AC
  Administered 2015-01-30: 4 mg via INTRAVENOUS

## 2015-01-30 MED ORDER — ZOLPIDEM TARTRATE 5 MG PO TABS
5.0000 mg | ORAL_TABLET | Freq: Every evening | ORAL | Status: DC | PRN
  Administered 2015-01-30 – 2015-02-01 (×3): 5 mg via ORAL
  Filled 2015-01-30 (×3): qty 1

## 2015-01-30 MED ORDER — CITALOPRAM HYDROBROMIDE 20 MG PO TABS
20.0000 mg | ORAL_TABLET | Freq: Every day | ORAL | Status: DC
  Administered 2015-01-31 – 2015-02-02 (×3): 20 mg via ORAL
  Filled 2015-01-30 (×4): qty 1

## 2015-01-30 MED ORDER — MORPHINE SULFATE 4 MG/ML IJ SOLN: 4.0000 mg | Freq: Once | INTRAMUSCULAR | Status: DC

## 2015-01-30 MED ORDER — GABAPENTIN 300 MG PO CAPS
300.0000 mg | ORAL_CAPSULE | Freq: Every day | ORAL | Status: DC
  Administered 2015-01-31 – 2015-02-02 (×3): 300 mg via ORAL
  Filled 2015-01-30 (×4): qty 1

## 2015-01-30 MED ORDER — ACETAMINOPHEN 650 MG RE SUPP: 650.0000 mg | Freq: Four times a day (QID) | RECTAL | Status: DC | PRN

## 2015-01-30 MED ORDER — ACETAMINOPHEN 325 MG PO TABS: 650.0000 mg | ORAL_TABLET | Freq: Four times a day (QID) | ORAL | Status: DC | PRN

## 2015-01-30 MED ORDER — ACETAMINOPHEN 500 MG PO TABS: 500.0000 mg | ORAL_TABLET | Freq: Four times a day (QID) | ORAL | Status: DC | PRN

## 2015-01-30 MED ORDER — LEVOTHYROXINE SODIUM 100 MCG PO TABS
100.0000 ug | ORAL_TABLET | Freq: Every day | ORAL | Status: DC
  Administered 2015-01-31 – 2015-02-02 (×3): 100 ug via ORAL
  Filled 2015-01-30 (×3): qty 1

## 2015-01-30 MED ORDER — ONDANSETRON HCL 4 MG/2ML IJ SOLN
4.0000 mg | Freq: Four times a day (QID) | INTRAMUSCULAR | Status: DC | PRN
  Administered 2015-01-30 – 2015-02-01 (×3): 4 mg via INTRAVENOUS
  Filled 2015-01-30 (×3): qty 2

## 2015-01-30 MED ORDER — MORPHINE SULFATE 2 MG/ML IJ SOLN
INTRAMUSCULAR | Status: AC
  Administered 2015-01-30: 4 mg via INTRAVENOUS
  Filled 2015-01-30: qty 2

## 2015-01-30 MED ORDER — PRAVASTATIN SODIUM 20 MG PO TABS
40.0000 mg | ORAL_TABLET | Freq: Every day | ORAL | Status: DC
Start: 2015-01-30 — End: 2015-02-02
  Administered 2015-01-30 – 2015-02-02 (×4): 40 mg via ORAL
  Filled 2015-01-30 (×4): qty 2

## 2015-01-30 MED ORDER — HYDROCODONE-ACETAMINOPHEN 5-325 MG PO TABS
1.0000 | ORAL_TABLET | ORAL | Status: DC | PRN
  Administered 2015-01-30 – 2015-01-31 (×4): 2 via ORAL
  Filled 2015-01-30 (×4): qty 2

## 2015-01-30 MED ORDER — NICOTINE 10 MG IN INHA: 1.0000 | RESPIRATORY_TRACT | Status: DC | PRN

## 2015-01-30 MED ORDER — MONTELUKAST SODIUM 10 MG PO TABS
10.0000 mg | ORAL_TABLET | Freq: Every day | ORAL | Status: DC
  Administered 2015-01-30 – 2015-02-02 (×4): 10 mg via ORAL
  Filled 2015-01-30 (×4): qty 1

## 2015-01-30 MED ORDER — MOMETASONE FURO-FORMOTEROL FUM 100-5 MCG/ACT IN AERO
2.0000 | INHALATION_SPRAY | Freq: Two times a day (BID) | RESPIRATORY_TRACT | Status: DC
  Administered 2015-01-31 – 2015-02-02 (×3): 2 via RESPIRATORY_TRACT
  Filled 2015-01-30: qty 8.8

## 2015-01-30 MED ORDER — FENTANYL CITRATE (PF) 100 MCG/2ML IJ SOLN
INTRAMUSCULAR | Status: AC
  Administered 2015-01-30: 50 ug via INTRAVENOUS
  Filled 2015-01-30: qty 2

## 2015-01-30 MED ORDER — CIPROFLOXACIN-DEXAMETHASONE 0.3-0.1 % OT SUSP
4.0000 [drp] | Freq: Two times a day (BID) | OTIC | Status: DC
  Administered 2015-01-31 – 2015-02-02 (×3): 4 [drp] via OTIC
  Filled 2015-01-30 (×2): qty 7.5

## 2015-01-30 MED ORDER — SODIUM CHLORIDE 0.9 % IV BOLUS (SEPSIS)
1000.0000 mL | Freq: Once | INTRAVENOUS | Status: AC
  Administered 2015-01-30: 1000 mL via INTRAVENOUS

## 2015-01-30 MED ORDER — FLUTICASONE PROPIONATE 50 MCG/ACT NA SUSP
2.0000 | Freq: Every day | NASAL | Status: DC | PRN
  Filled 2015-01-30: qty 16

## 2015-01-30 MED ORDER — IOHEXOL 350 MG/ML SOLN
100.0000 mL | Freq: Once | INTRAVENOUS | Status: AC | PRN
  Administered 2015-01-30: 100 mL via INTRAVENOUS

## 2015-01-30 MED ORDER — LORATADINE 10 MG PO TABS
10.0000 mg | ORAL_TABLET | Freq: Every day | ORAL | Status: DC
  Administered 2015-01-30 – 2015-02-02 (×4): 10 mg via ORAL
  Filled 2015-01-30 (×4): qty 1

## 2015-01-30 NOTE — Consult Note (Signed)
Patient ID: Sara David, female   DOB: [birth year], 69 y.o.   MRN: [id number]  HPI Sara David is a 69 y.o. female well-known to our service.  HPI  She is referred to the emergency room today for evaluation of new onset significant lower quadrant abdominal pain. Patient has a long history of abdominal problems dating back several years to a small bowel obstruction. She had small bowel symptoms at the time and a video capsule study was performed. The capsular lodged at the site of a bowel obstruction and the patient underwent laparotomy for bowel obstruction.  There was a mass at the site of this obstruction which turned out to be a metastatic breast carcinoma. Further workup identified a mass in her breast which also was a carcinoma. Because of the stage IV disease she underwent chemotherapy. The graft she developed another large abdominal hernia at which time she underwent repair with mesh. The procedure was uncomplicated.  She is currently on hormone medications only. 3 months ago she had abdominal discomfort which was difficult to characterize. Workup was largely unremarkable. She was noted to have a T7 metastatic nodule on workup but that tumor was not felt to be responsible for her current abdominal symptoms. HIDA scan at that time demonstrated 27% function versus a normal of 35% function. Ultrasound demonstrated sludge in the gallbladder. She was referred to our office to consider surgical intervention.  She underwent a robot-assisted lap cholecystectomy. The procedure was uncomplicated. However, pathology demonstrated metastatic breast cancer in the hepatoduodenal ligament area along the cystic duct. PET scan was performed following that study which did not demonstrate any other evidence for disease. She remains on hormone therapy.  This a.m. she awoke with significant bilateral lower quadrant pain radiating to her back and upper abdomen. The pain was crampy without any nausea or vomiting. She did  have a normal bowel movement. She denies any fever or chills. She presented to the emergency room for further evaluation.  Workup was largely unremarkable with exception of a CT scan which demonstrated some mild small bowel distention. There did not appear to be any clear cut obstruction. Her stomach was not enlarged or distended. No hepatic masses were identified and there was no other evidence of metastatic disease. She refuses nasogastric tube. The surgical service was consulted with regard to possible recurrent small bowel obstruction  Past Medical History  Diagnosis Date  . Hypertension   . Cancer   . Breast cancer     Past Surgical History  Procedure Laterality Date  . Back surgery    . Cholecystectomy      History reviewed. No pertinent family history.  Social History History  Substance Use Topics  . Smoking status: Current Every Day Smoker -- 0.50 packs/day    Types: Cigarettes  . Smokeless tobacco: Not on file  . Alcohol Use: No    Allergies  Allergen Reactions  . Erythromycin Diarrhea and Nausea And Vomiting  . Latex Rash  . Tape Itching and Rash  . Wellbutrin [Bupropion] Rash    Current Facility-Administered Medications  Medication Dose Route Frequency Provider Last Rate Last Dose  . 0.9 % NaCl with KCl 20 mEq/ L  infusion   Intravenous Continuous Theodoro Grist, MD      . acetaminophen (TYLENOL) suppository 650 mg  650 mg Rectal Q6H PRN Theodoro Grist, MD      . acetaminophen (TYLENOL) tablet 500-1,000 mg  500-1,000 mg Oral Q6H PRN Theodoro Grist, MD      .  ciprofloxacin-dexamethasone (CIPRODEX) 0.3-0.1 % otic suspension 4 drop  4 drop Left Ear BID Theodoro Grist, MD      . citalopram (CELEXA) tablet 20 mg  20 mg Oral Daily Theodoro Grist, MD      . cyclobenzaprine (FLEXERIL) tablet 10 mg  10 mg Oral Q8H PRN Theodoro Grist, MD      . fluticasone (FLONASE) 50 MCG/ACT nasal spray 2 spray  2 spray Each Nare Daily PRN Theodoro Grist, MD      . gabapentin (NEURONTIN)  capsule 300 mg  300 mg Oral Daily Theodoro Grist, MD      . heparin injection 5,000 Units  5,000 Units Subcutaneous 3 times per day Theodoro Grist, MD      . HYDROcodone-acetaminophen (NORCO/VICODIN) 5-325 MG per tablet 1-2 tablet  1-2 tablet Oral Q4H PRN Theodoro Grist, MD      . letrozole [facility]) tablet 2.5 mg  2.5 mg Oral Daily Theodoro Grist, MD      . Derrill Memo ON 01/31/2015] levothyroxine (SYNTHROID, LEVOTHROID) tablet 100 mcg  100 mcg Oral QAC breakfast Theodoro Grist, MD      . loratadine (CLARITIN) tablet 10 mg  10 mg Oral Daily Theodoro Grist, MD      . mometasone-formoterol (DULERA) 100-5 MCG/ACT inhaler 2 puff  2 puff Inhalation BID Theodoro Grist, MD      . montelukast (SINGULAIR) tablet 10 mg  10 mg Oral Daily Theodoro Grist, MD      . morphine 4 MG/ML injection 4 mg  4 mg Intravenous Once Ponciano Ort, MD   4 mg at 01/30/15 1327  . nicotine (NICODERM CQ - dosed in mg/24 hours) patch 21 mg  21 mg Transdermal Daily Theodoro Grist, MD      . nicotine (NICOTROL) 10 MG inhaler 1 continuous puffing  1 continuous puffing Inhalation PRN Theodoro Grist, MD      . pravastatin (PRAVACHOL) tablet 40 mg  40 mg Oral Daily Theodoro Grist, MD      . zolpidem (AMBIEN) tablet 5 mg  5 mg Oral QHS PRN Theodoro Grist, MD         Review of Systems A 10 point review of systems was asked and was negative except for the following positive findings: noted above   Physical Exam Blood pressure 130/54, pulse 72, temperature 98.2 F (36.8 C), temperature source Oral, resp. rate 20, height 5\' 4"  (1.626 m), weight 66.225 kg (146 lb), SpO2 100 %. CONSTITShe appears well with no evidence of any significant pain at the present time. Her last pain medicine was 6 hours ago.EYES: Pupils are equal, round, and reactive to light, Sclera are non-icteric. EARS, NOSE, MOUTH AND THROAT: The oropharynx is clear. The oral mucosa is pink and moist. Hearing is intact to voice. LYMPH NODES:  Lymph nodes in the neck are  normal. RESPIRATORY:  Lungs are clear. There is normal respiratory effort, with equal breath sounds bilaterally, and without pathologic use of accessory muscles. CARDIOVASCULAR: Heart is regular without murmurs, gallops, or rubs. GI: The abdomen is  soft, nontender, and nondistended. There are no palpable masses. There is no hepatosplenomegaly. There are normal bowel sounds in all quadrants. I cannot palpate any recurrent or persistent hernia. She has no rebound or guarding noted.  GU: Rectal deferred.   MUSCULOSKELETAL: Normal muscle strength and tone. No cyanosis or edema.   SKIN: Turgor is good and there are no pathologic skin lesions or ulcers. NEUROLOGIC: Motor and sensation is grossly normal. Cranial nerves are grossly intact. PSYCH:  Oriented to person, place and time. Affect is normal.  Data RevieweI have reviewed the patient's CT scan and her laboratory values.have personally reviewed the patient's imaging, laboratory findings and medical records.    Assessment    This woman has a difficult clinical presentation but I do not believe she has any evidence for active small bowel obstruction. She is hungry and would like to eat. She had normal bowel movement this morning and she's not vomiting.  She did have a lumbar microdiscectomy 3 weeks ago and could have some ileus related to that particular problem.     Plan    I do not see any indications for surgery the present time. Her CT scan which demonstrated the bowel mismatch in size was performed without oral contrast was very difficult to make any significant decisions with regard to her bowel or possible bowel obstruction. Her symptoms persist I would repeat her CT scan with oral contrast. We will continue to follow her but I anticipate that her symptoms are somewhat related to her stage IV breast carcinoma. I discussed the findings and plan with the patient and her husband.  Since she is not vomiting and has no significant nausea I  think it reasonable to consider starting a clear liquid diet to see how she does. She has refused nasogastric suction and should she vomits then she might be more encouraged to consider decompression.      Time spent with the patient was 50 minutes, with more than 50% of the time spent in face-to-face education, counseling and care coordination.     Dia Crawford III 01/30/2015, 8:22 PM

## 2015-01-30 NOTE — ED Notes (Signed)
Pt from [facility], severe lower abd pain , CBC results followed pt ,

## 2015-01-30 NOTE — Progress Notes (Signed)
Dr. Jannifer Franklin notified of patients c/o nausea and needing order clarification for cardiac monitoring ordered by Dr. Ether Griffins. New orders to be entered by Dr. Jannifer Franklin for zofran PRN and telemetry monitoring.

## 2015-01-30 NOTE — ED Notes (Signed)
Transported to ct 

## 2015-01-30 NOTE — ED Notes (Signed)
Nausea, no vomiting, no diarrhea, no dysuria

## 2015-01-30 NOTE — ED Notes (Signed)
MD at bedside. 

## 2015-01-30 NOTE — H&P (Signed)
[facility] NAME: Sara David    MR#:  [id number]  DATE OF BIRTH:  04-Sep-1945  DATE OF ADMISSION:  01/30/2015  PRIMARY CARE PHYSICIAN: Lorenza Evangelist, MD   REQUESTING/REFERRING PHYSICIAN: Dr. Robet Leu  CHIEF COMPLAINT:   Chief Complaint  Patient presents with  . Abdominal Pain    began lower abd, while waiting for triage moving up to epigastrum, radiating to back, dizzy    HISTORY OF PRESENT ILLNESS: Sara David  is a 69 y.o. 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 The patient she was doing well up until today at around 8:30 AM which is that he had an sharp spasmodic intermittent 9/10  By the intensity pain in the lower abdomen which radiated to upper abdomen accompanied by nausea , the patient had small bowel movement today which was normal . She has been also belching, CT scan of abdomen revealed early small bowel obstruction and hospitalist services were contacted for admission patient refuses NG tube placement.  Complains of feeling dehydrated.  PAST MEDICAL HISTORY:   Past Medical History  Diagnosis Date  . Hypertension   . Cancer   . Breast cancer     PAST SURGICAL HISTORY:  Past Surgical History  Procedure Laterality Date  . Back surgery    . Cholecystectomy      SOCIAL HISTORY:  History  Substance Use Topics  . Smoking status: Current Every Day Smoker -- 0.50 packs/day    Types: Cigarettes  . Smokeless tobacco: Not on file  . Alcohol Use: No    FAMILY HISTORY: No family history on file.  DRUG ALLERGIES:  Allergies  Allergen Reactions  . Erythromycin Diarrhea and Nausea And Vomiting  . Latex Rash  . Tape Itching and Rash  . Wellbutrin [Bupropion]  Rash    Review of Systems  Constitutional: Negative for fever, chills, weight loss and malaise/fatigue.  HENT: Positive for congestion and ear pain.   Eyes: Negative for blurred vision and double vision.  Respiratory: Negative for cough, sputum production, shortness of breath and wheezing.   Cardiovascular: Negative for chest pain, palpitations, orthopnea, leg swelling and PND.  Gastrointestinal: Positive for heartburn, nausea and abdominal pain. Negative for vomiting, diarrhea, constipation, blood in stool and melena.  Genitourinary: Negative for dysuria, urgency, frequency and hematuria.  Musculoskeletal: Positive for back pain. Negative for falls.  Skin: Negative for rash.  Neurological: Negative for dizziness and weakness.  Psychiatric/Behavioral: Negative for depression and memory loss. The patient is not nervous/anxious.     MEDICATIONS AT HOME:  Prior to Admission medications   Medication Sig Start Date End Date Taking? Authorizing Provider  acetaminophen (TYLENOL) 500 MG tablet Take 500-1,000 mg by mouth every 6 (six) hours as needed for mild pain or headache.   Yes Historical Provider, MD  cetirizine (ZYRTEC) 10 MG tablet Take 10 mg by mouth daily.   Yes Historical Provider, MD  citalopram (CELEXA) 20 MG tablet Take 20 mg by mouth daily.   Yes Historical Provider, MD  cyclobenzaprine (FLEXERIL) 10 MG tablet Take 10 mg by mouth every 8 (eight) hours as needed for muscle spasms.   Yes Historical Provider, MD  fluticasone (FLONASE) 50 MCG/ACT nasal spray Place 2 sprays into both  nostrils daily as needed for rhinitis.   Yes Historical Provider, MD  Fluticasone-Salmeterol (ADVAIR) 100-50 MCG/DOSE AEPB Inhale 1 puff into the lungs 2 (two) times daily.   Yes Historical Provider, MD  gabapentin (NEURONTIN) 300 MG capsule Take 300 mg by mouth daily.   Yes Historical Provider, MD  HYDROcodone-acetaminophen (NORCO/VICODIN) 5-325 MG per tablet Take 1-2 tablets by mouth every 4 (four) hours as  needed for moderate pain.   Yes Historical Provider, MD  letrozole (FEMARA) 2.5 MG tablet Take 2.5 mg by mouth daily.   Yes Historical Provider, MD  levothyroxine (SYNTHROID, LEVOTHROID) 100 MCG tablet Take 100 mcg by mouth daily.   Yes Historical Provider, MD  meloxicam (MOBIC) 7.5 MG tablet Take 7.5 mg by mouth 2 (two) times daily with a meal.   Yes Historical Provider, MD  montelukast (SINGULAIR) 10 MG tablet Take 10 mg by mouth daily.   Yes Historical Provider, MD  omeprazole (PRILOSEC) 20 MG capsule Take 20 mg by mouth daily.   Yes Historical Provider, MD  pravastatin (PRAVACHOL) 40 MG tablet Take 40 mg by mouth daily.   Yes Historical Provider, MD  valsartan-hydrochlorothiazide (DIOVAN-HCT) 80-12.5 MG per tablet Take 1 tablet by mouth daily.   Yes Historical Provider, MD  zolpidem (AMBIEN) 10 MG tablet Take 10 mg by mouth at bedtime as needed for sleep.   Yes Historical Provider, MD      PHYSICAL EXAMINATION:   VITAL SIGNS: Blood pressure 133/62, pulse 78, temperature 98 F (36.7 C), temperature source Oral, resp. rate 15, height 5\' 4"  (1.626 m), weight 70.308 kg (155 lb), SpO2 99 %.  GENERAL:  70 y.o.-year-old patient lying in the bed with no acute distress. Dry oral mucosa EYES: Pupils equal, round, reactive to light and accommodation. No scleral icterus. Extraocular muscles intact. Left tympanic membrane looks normal some cerumen was noted and discomfort on the placement of the otoscope.  HEENT: Head atraumatic, normocephalic. Oropharynx and nasopharynx clear.  NECK:  Supple, no jugular venous distention. No thyroid enlargement, no tenderness.  LUNGS: Somewhat diminished breath sounds bilaterally, no wheezing, rales,rhonchi or crepitation. No use of accessory muscles of respiration.  CARDIOVASCULAR: S1, S2 normal. No murmurs, rubs, or gallops.  ABDOMEN: Soft, tender diffusely mostly in the epigastric area as well as the right lower quadrant, mild distension. Bowel sounds diminished.  No organomegaly or mass.  EXTREMITIES: No pedal edema, cyanosis, or clubbing.  NEUROLOGIC: Cranial nerves II through XII are intact. Muscle strength 5/5 in all extremities. Sensation intact. Gait not checked.  PSYCHIATRIC: The patient is alert and oriented x 3.  SKIN: No obvious rash, lesion, or ulcer.   LABORATORY PANEL:   CBC  Recent Labs Lab 01/30/15 1321  WBC 10.4  HGB 14.1  HCT 42.3  PLT 298  MCV 93.1  MCH 31.0  MCHC 33.3  RDW 13.6  LYMPHSABS 1.8  MONOABS 0.6  EOSABS 0.1  BASOSABS 0.0   ------------------------------------------------------------------------------------------------------------------  Chemistries   Recent Labs Lab 01/30/15 1321  NA 136  K 3.9  CL 98*  CO2 24  GLUCOSE 103*  BUN 26*  CREATININE 1.10*  CALCIUM 10.1  AST 24  ALT 18  ALKPHOS 80  BILITOT 0.6   ------------------------------------------------------------------------------------------------------------------  Cardiac Enzymes No results for input(s): TROPONINI in the last 168 hours. ------------------------------------------------------------------------------------------------------------------  RADIOLOGY: Ct Angio Chest Aorta W/cm &/or Wo/cm  01/30/2015   CLINICAL DATA:  Low back and epigastric pain. History of back surgery, breast carcinoma, cholecystectomy.  EXAM: CT ANGIOGRAPHY CHEST, ABDOMEN AND PELVIS  TECHNIQUE: Multidetector CT imaging through the chest, abdomen and pelvis was performed using the standard protocol during bolus administration of intravenous contrast. Multiplanar reconstructed images and MIPs were obtained and reviewed to evaluate the vascular anatomy.  CONTRAST:  156mL OMNIPAQUE IOHEXOL 350 MG/ML SOLN  COMPARISON:  11/10/2013  FINDINGS: CTA CHEST  The noncontrast scout shows coronary and aortic calcifications. No hyperdense crescent or mediastinal hematoma. No pleural or pericardial effusion. No pneumothorax.  CTA via left arm injection. The SVC is patent.  Satisfactory opacification of pulmonary arteries noted, and there is no evidence of pulmonary emboli. Patent bilateral pulmonary veins. Adequate contrast opacification of the thoracic aorta with no evidence of dissection, aneurysm, or stenosis. There is patent proximal brachiocephalic arch anatomy without proximal stenosis. The left vertebral artery arises directly from the arch, an anatomic variant.  No hilar or mediastinal adenopathy. Mild pulmonary emphysematous changes. No nodule or focal airspace consolidation. Stable sclerotic lesions in multiple thoracic vertebral bodies and the sternum, as well as a partially lytic T7 lesion as before.  Review of the MIP images confirms the above findings.  CTA ABDOMEN AND PELVIS  Arterial findings:  Aorta: Scattered calcified plaque predominately in the infrarenal segment. Mildly ectatic infrarenal segment up to 2.6 cm transverse diameter. There is some eccentric mural thrombus with focal penetrating atheromatous ulcers posteriorly in the ectatic segment. No stenosis.  Celiac axis:         Patent  Superior mesenteric: Patent, classic distal branch anatomy.  Left renal:          Single with proximal bifurcation, patent  Right renal: Partially calcified ostial plaque over a length of less than 1 cm resulting in at least mild stenosis, patent distally.  Inferior mesenteric: Patent, arising from the ectatic segment  Left iliac: Heavy eccentric plaque through the common iliac, which is ectatic distally up to 14 mm diameter. External iliac is widely patent. Heavy atheromatous calcified plaque in the internal iliac.  Right iliac: Eccentric nonocclusive plaque through the common iliac and internal iliac. The external iliac is widely patent.  Venous findings: Dedicated venous phase imaging not obtained. Patent bilateral renal veins noted.  Review of the MIP images confirms the above findings.  Nonvascular findings: Unremarkable arterial phase evaluation of liver, spleen, adrenal  glands, kidneys, pancreas. Stomach is nondistended. There is mild distention of multiple mid abdominal small bowel loops. Distal small bowel is decompressed. No discrete transition zone is evident. Small bowel anastomotic staple line in the pelvis. Normal appendix. The colon is nondilated. Scattered sigmoid diverticula without adjacent inflammatory/edematous change. Urinary bladder physiologically distended. No ascites. No free air. Changes of ventral hernia repair with decrease in amount of fluid adjacent to the mesh since previous exam. Multiple scattered sclerotic lesions in the lumbar spine and pelvis are stable. Degenerative disc disease most marked L4-5 and L5-S1.  IMPRESSION: 1. Negative for acute PE or thoracic aortic dissection. 2. Multiple distended proximal and mid abdominal small bowel loops suggesting early/partial obstruction, without discrete transition point or etiology. 3. Multiple sclerotic osseous lesions, and mixed lytic/ sclerotic T7 lesion, stable since 11/10/2013. 4. Ectatic abdominal aorta at risk for aneurysm development. Recommend follow up by [place] in 5 years. This recommendation follows ACR consensus guidelines: White Paper of the ACR Incidental Findings Committee II on Vascular Findings. J Am Coll Radiol 2013; 10:789-794.   Electronically Signed   By: Lucrezia Europe M.D.   On: 01/30/2015 16:36   Ct Cta Abd/pel W/cm &/or W/o Cm  01/30/2015  CLINICAL DATA:  Low back and epigastric pain. History of back surgery, breast carcinoma, cholecystectomy.  EXAM: CT ANGIOGRAPHY CHEST, ABDOMEN AND PELVIS  TECHNIQUE: Multidetector CT imaging through the chest, abdomen and pelvis was performed using the standard protocol during bolus administration of intravenous contrast. Multiplanar reconstructed images and MIPs were obtained and reviewed to evaluate the vascular anatomy.  CONTRAST:  143mL OMNIPAQUE IOHEXOL 350 MG/ML SOLN  COMPARISON:  11/10/2013  FINDINGS: CTA CHEST  The noncontrast scout shows coronary  and aortic calcifications. No hyperdense crescent or mediastinal hematoma. No pleural or pericardial effusion. No pneumothorax.  CTA via left arm injection. The SVC is patent. Satisfactory opacification of pulmonary arteries noted, and there is no evidence of pulmonary emboli. Patent bilateral pulmonary veins. Adequate contrast opacification of the thoracic aorta with no evidence of dissection, aneurysm, or stenosis. There is patent proximal brachiocephalic arch anatomy without proximal stenosis. The left vertebral artery arises directly from the arch, an anatomic variant.  No hilar or mediastinal adenopathy. Mild pulmonary emphysematous changes. No nodule or focal airspace consolidation. Stable sclerotic lesions in multiple thoracic vertebral bodies and the sternum, as well as a partially lytic T7 lesion as before.  Review of the MIP images confirms the above findings.  CTA ABDOMEN AND PELVIS  Arterial findings:  Aorta: Scattered calcified plaque predominately in the infrarenal segment. Mildly ectatic infrarenal segment up to 2.6 cm transverse diameter. There is some eccentric mural thrombus with focal penetrating atheromatous ulcers posteriorly in the ectatic segment. No stenosis.  Celiac axis:         Patent  Superior mesenteric: Patent, classic distal branch anatomy.  Left renal:          Single with proximal bifurcation, patent  Right renal: Partially calcified ostial plaque over a length of less than 1 cm resulting in at least mild stenosis, patent distally.  Inferior mesenteric: Patent, arising from the ectatic segment  Left iliac: Heavy eccentric plaque through the common iliac, which is ectatic distally up to 14 mm diameter. External iliac is widely patent. Heavy atheromatous calcified plaque in the internal iliac.  Right iliac: Eccentric nonocclusive plaque through the common iliac and internal iliac. The external iliac is widely patent.  Venous findings: Dedicated venous phase imaging not obtained. Patent  bilateral renal veins noted.  Review of the MIP images confirms the above findings.  Nonvascular findings: Unremarkable arterial phase evaluation of liver, spleen, adrenal glands, kidneys, pancreas. Stomach is nondistended. There is mild distention of multiple mid abdominal small bowel loops. Distal small bowel is decompressed. No discrete transition zone is evident. Small bowel anastomotic staple line in the pelvis. Normal appendix. The colon is nondilated. Scattered sigmoid diverticula without adjacent inflammatory/edematous change. Urinary bladder physiologically distended. No ascites. No free air. Changes of ventral hernia repair with decrease in amount of fluid adjacent to the mesh since previous exam. Multiple scattered sclerotic lesions in the lumbar spine and pelvis are stable. Degenerative disc disease most marked L4-5 and L5-S1.  IMPRESSION: 1. Negative for acute PE or thoracic aortic dissection. 2. Multiple distended proximal and mid abdominal small bowel loops suggesting early/partial obstruction, without discrete transition point or etiology. 3. Multiple sclerotic osseous lesions, and mixed lytic/ sclerotic T7 lesion, stable since 11/10/2013. 4. Ectatic abdominal aorta at risk for aneurysm development. Recommend follow up by [place] in 5 years. This recommendation follows ACR consensus guidelines: White Paper of the ACR Incidental Findings Committee II on Vascular Findings. J Am Coll Radiol 2013; 10:789-794.   Electronically Signed  By: Lucrezia Europe M.D.   On: 01/30/2015 16:36    EKG: Orders placed or performed during the hospital encounter of 01/30/15  . ED EKG  . ED EKG   EKG done in the emergency room revealed a normal sinus rhythm at 75 beats per minute normal axis no acute ST-T changes  IMPRESSION AND PLAN:  Principal Problem:   Small bowel obstruction Active Problems:   Breast cancer   Back pain   B12 deficiency   Gastroesophageal reflux disease   IBS (irritable bowel syndrome)    Asthma   Hypothyroidism   FH: cholecystectomy 1. Partial small bowel obstruction and medication medical floor,  unfortunately she is refusing NG tube placement will continue IV fluids as well as keep her nothing by mouth and will get surgical consultation,  continue also PPIs 2. Dehydration continue IV fluids at high rate 3. Renal insufficiency likely due to some element of dehydration,  as above we'll continue IV fluids and follow kidney function in the morning 4. Tobacco abuse discusses dictation for approximately 4 minutes to complete replacement therapy will be initiated 5. External otitis sheet patient on ciprofloxacin drops 6 peripheral vascular disease discussed about tobacco issues patient may benefit from Plavix All the records are reviewed and case discussed with ED provider. Management plans discussed with the patient, family and they are in agreement.  CODE STATUS:  Full code  TOTAL TIME TAKING CARE OF THIS PATIENT: 60 minutes.    Theodoro Grist M.D on 01/30/2015 at 6:02 PM  Between 7am to 6pm - Pager - [phone] After 6pm go to [email or web address] - password [email or web address] [facility]  Office  [phone]  CC: Primary care physician; Lorenza Evangelist, MD

## 2015-01-30 NOTE — ED Provider Notes (Signed)
[facility] Emergency Department Provider Note   ____________________________________________  Time seen: Approximately 3:29 PM  I have reviewed the triage vital signs and the nursing notes.   HISTORY  Chief Complaint Abdominal Pain   HPI Nakshatra Klose is a 69 y.o. female with a history of metastatic breast cancer, hypertension, status post cholecystectomy 4-5 weeks ago, and status post lumbar discectomy 3 weeks ago who presents to the emergency department with onset of lower abdominal pain around 8:30 AM that now radiates to the epigastric area and posterior thoracic area between her shoulder blades. She notes she has had general malaise for the past 2 days with some nausea yesterday but did not have any pain until today. This morning she was already awake and had taken her regular medications and had some coffee when the pain began. She had also had one normal bowel movement prior to the onset of the pain. She presented to her primary care doctor, Boykin Reaper, at [facility] this morning and was sent to the emergency department for further evaluation. The pain has increased since she has been in the waiting room and is now severe.  The pain is constant and "hurting" with periods of sharp pain that worsened without provocation. Nothing seems to make the pain better or worse. It's unaffected by movement. It is associated with lightheadedness and some shortness of breath.  She has not had recent fevers or illness. Her cough is her baseline smoker's cough. She has not had anything to eat or drink after her coffee today.   Past Medical History  Diagnosis Date  . Hypertension   . Cancer   . Breast cancer     There are no active problems to display for this patient.   Past Surgical History  Procedure Laterality Date  . Back surgery    . 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 acetaminophen (TYLENOL) 500 MG tablet   Oral   Take 500-1,000 mg by mouth every 6 (six) hours as needed for mild pain or headache.         . cetirizine (ZYRTEC) 10 MG tablet   Oral   Take 10 mg by mouth daily.         . citalopram (CELEXA) 20 MG tablet   Oral   Take 20 mg by mouth daily.         . cyclobenzaprine (FLEXERIL) 10 MG tablet   Oral   Take 10 mg by mouth every 8 (eight) hours as needed for muscle spasms.         . fluticasone (FLONASE) 50 MCG/ACT nasal spray   Each Nare   Place 2 sprays into both nostrils daily as needed for rhinitis.         . Fluticasone-Salmeterol (ADVAIR) 100-50 MCG/DOSE AEPB   Inhalation   Inhale 1 puff into the lungs 2 (two) times daily.         Marland Kitchen gabapentin (NEURONTIN) 300 MG capsule   Oral   Take 300 mg by mouth daily.         Marland Kitchen HYDROcodone-acetaminophen (NORCO/VICODIN) 5-325 MG per tablet   Oral   Take 1-2 tablets by mouth every 4 (four) hours as needed for moderate pain.         Marland Kitchen letrozole (FEMARA) 2.5 MG  tablet   Oral   Take 2.5 mg by mouth daily.         Marland Kitchen levothyroxine (SYNTHROID, LEVOTHROID) 100 MCG tablet   Oral   Take 100 mcg by mouth daily.         . meloxicam (MOBIC) 7.5 MG tablet   Oral   Take 7.5 mg by mouth 2 (two) times daily with a meal.         . montelukast (SINGULAIR) 10 MG tablet   Oral   Take 10 mg by mouth daily.         Marland Kitchen omeprazole (PRILOSEC) 20 MG capsule   Oral   Take 20 mg by mouth daily.         . pravastatin (PRAVACHOL) 40 MG tablet   Oral   Take 40 mg by mouth daily.         . valsartan-hydrochlorothiazide (DIOVAN-HCT) 80-12.5 MG per tablet   Oral   Take 1 tablet by mouth daily.         Marland Kitchen zolpidem (AMBIEN) 10 MG tablet   Oral   Take 10 mg by mouth at bedtime as needed for sleep.           Allergies Erythromycin; Latex; Tape; and Wellbutrin  No family history  on file.  Social History History  Substance Use Topics  . Smoking status: Current Every Day Smoker -- 0.50 packs/day    Types: Cigarettes  . Smokeless tobacco: Not on file  . Alcohol Use: No   he lives with her husband and her 82 year old mother.  Review of Systems Constitutional: No fever/chills ENT: No URI Cardiovascular: Chest pain per History of present illness Respiratory: Shortness of breath per history of present illness Gastrointestinal: See history of present illness   Musculoskeletal: See history of present illness Skin: Negative for rash. Neurological: No new numbness or weakness Psychiatric:No SI/HI Endocrine:No hot/cold intolerance 10-point ROS otherwise negative.  ____________________________________________   PHYSICAL EXAM:  VITAL SIGNS: ED Triage Vitals  Enc Vitals Group     BP 01/30/15 1310 109/83 mmHg     Pulse Rate 01/30/15 1310 74     Resp 01/30/15 1310 20     Temp 01/30/15 1310 97.4 F (36.3 C)     Temp Source 01/30/15 1310 Oral     SpO2 01/30/15 1310 100 %     Weight 01/30/15 1310 155 lb (70.308 kg)     Height 01/30/15 1310 5\' 4"  (1.626 m)     Head Cir --      Peak Flow --      Pain Score 01/30/15 1310 10     Pain Loc --      Pain Edu? --      Excl. in [place]? --     Constitutional: Alert and oriented. Well appearing but appears somewhat uncomfortable. Eyes: Conjunctivae are normal. PERRL. EOMI. Head: Atraumatic. Nose: No congestion/rhinnorhea. Mouth/Throat: Mucous membranes are moist.  Oropharynx non-erythematous. Neck: No stridor.  Lymphatic: No cervical lymphadenopathy. Cardiovascular: Normal rate, regular rhythm. Grossly normal heart sounds.  Peripheral pulses 2+ B Respiratory: Normal respiratory effort.  No retractions. Lungs CTAB. Gastrointestinal: Soft. No distention. Moderate tenderness epigastric, right lower quadrant, left mid abdomen with no rebound or guarding. Normal bowel sounds. Musculoskeletal: No lower extremity  tenderness nor edema.  No calf TTP.  Neurologic:  Normal speech and language. No gross focal neurologic deficits are appreciated. Speech is normal. Skin:  Skin is warm, dry and intact. No rash noted. Scar over lumbar  spine well-healed Psychiatric: Mood and affect are normal. Speech and behavior are normal.  ____________________________________________   LABS (all labs ordered are listed, but only abnormal results are displayed)  Labs Reviewed  CBC WITH DIFFERENTIAL/PLATELET - Abnormal; Notable for the following:    Neutro Abs 7.9 (*)    All other components within normal limits  COMPREHENSIVE METABOLIC PANEL - Abnormal; Notable for the following:    Chloride 98 (*)    Glucose, Bld 103 (*)    BUN 26 (*)    Creatinine, Ser 1.10 (*)    Total Protein 8.2 (*)    GFR calc non Af Amer 50 (*)    GFR calc Af Amer 58 (*)    All other components within normal limits  URINALYSIS COMPLETEWITH MICROSCOPIC ([facility] ONLY) - Abnormal; Notable for the following:    Color, Urine YELLOW (*)    APPearance CLEAR (*)    Ketones, ur 1+ (*)    Leukocytes, UA 1+ (*)    Squamous Epithelial / LPF 0-5 (*)    All other components within normal limits  LIPASE, BLOOD  TROPONIN I   ____________________________________________  EKG   Date: 01/30/2015 1310  Rate: 72  Rhythm: normal sinus rhythm  QRS Axis: normal  Intervals: normal  ST/T Wave abnormalities: normal  Conduction Disutrbances: none  Narrative Interpretation: unremarkable   Date: 01/30/2015 1524, with epigastric pain  Rate: 75  Rhythm: normal sinus rhythm  QRS Axis: normal  Intervals: normal  ST/T Wave abnormalities: normal  Conduction Disutrbances: none  Narrative Interpretation: unremarkable  ____________________________________________  RADIOLOGY CT chest/abd/pelvis: 1. Negative for acute PE or thoracic aortic dissection. 2. Multiple distended proximal and mid abdominal small bowel loops suggesting early/partial obstruction,  without discrete transition point or etiology. 3. Multiple sclerotic osseous lesions, and mixed lytic/ sclerotic T7 lesion, stable since 11/10/2013. 4. Ectatic abdominal aorta at risk for aneurysm development. ____________________________________________   PROCEDURES  Procedure(s) performed: None  Critical Care performed: No  ____________________________________________   INITIAL IMPRESSION / ASSESSMENT AND PLAN / ED COURSE  Pertinent labs & imaging results that were available during my care of the patient were reviewed by me and considered in my medical decision making (see chart for details).  A patient with chest pain radiating to her posterior shoulder blades and epigastric and right lower quadrant pain with significant past medical history for metastatic breast cancer, SBO, recent lumbar discectomy and recent cholecystectomy. Broad differential diagnosis includes ACS, aortic dissection, AAA, appendicitis, SBO, metastatic cancer. We will proceed with CT chest abdomen and pelvis to further evaluate patient's symptoms. We will hydrate with IV normal saline as patient has had nothing by mouth and will be getting IV contrast.  ----------------------------------------- 5:22 PM on 01/30/2015 -----------------------------------------  Patient and daughter updated on CT results. Patient strongly opposed to NG tube. Current exam patient with normal bowel sounds, abdomen soft and nondistended with mild tenderness lower abdomen and epigastric without rebound or guarding. Pain had improved after last dose of fentanyl but is returning so I will repeat her dose. Discussed admission with Dr. Curt Bears, [facility] doc, who will admit for further management. ____________________________________________   FINAL CLINICAL IMPRESSION(S) / ED DIAGNOSES  Final diagnoses:  Chest pain   Early or partial SBO    Ponciano Ort, MD 01/30/15 1726

## 2015-01-31 ENCOUNTER — Inpatient Hospital Stay: Payer: Medicare Other

## 2015-01-31 DIAGNOSIS — K565 Intestinal adhesions [bands] with obstruction (postprocedural) (postinfection): Secondary | ICD-10-CM

## 2015-01-31 DIAGNOSIS — C50911 Malignant neoplasm of unspecified site of right female breast: Secondary | ICD-10-CM

## 2015-01-31 LAB — BASIC METABOLIC PANEL
Anion gap: 7 (ref 5–15)
BUN: 21 mg/dL — ABNORMAL HIGH (ref 6–20)
CALCIUM: 8.3 mg/dL — AB (ref 8.9–10.3)
CO2: 25 mmol/L (ref 22–32)
Chloride: 107 mmol/L (ref 101–111)
Creatinine, Ser: 1.07 mg/dL — ABNORMAL HIGH (ref 0.44–1.00)
GFR calc Af Amer: >60 mL/min (ref >60)
GFR, EST NON AFRICAN AMERICAN: 52 mL/min — AB (ref >60)
GLUCOSE: 86 mg/dL (ref 65–99)
Potassium: 4 mmol/L (ref 3.5–5.1)
SODIUM: 139 mmol/L (ref 135–145)

## 2015-01-31 LAB — CBC
HCT: 37.3 % (ref 35.0–47.0)
Hemoglobin: 12.5 g/dL (ref 12.0–16.0)
MCH: 31.7 pg (ref 26.0–34.0)
MCHC: 33.5 g/dL (ref 32.0–36.0)
MCV: 94.5 fL (ref 80.0–100.0)
PLATELETS: 221 10*3/uL (ref 150–440)
RBC: 3.95 MIL/uL (ref 3.80–5.20)
RDW: 13.6 % (ref 11.5–14.5)
WBC: 5.6 10*3/uL (ref 3.6–11.0)

## 2015-01-31 LAB — TROPONIN I

## 2015-01-31 MED ORDER — DIPHENHYDRAMINE HCL 25 MG PO CAPS
25.0000 mg | ORAL_CAPSULE | Freq: Once | ORAL | Status: AC
  Administered 2015-01-31: 25 mg via ORAL
  Filled 2015-01-31: qty 1

## 2015-01-31 MED ORDER — BISACODYL 10 MG RE SUPP
10.0000 mg | Freq: Once | RECTAL | Status: AC
  Administered 2015-01-31: 10 mg via RECTAL
  Filled 2015-01-31: qty 1

## 2015-01-31 NOTE — Progress Notes (Signed)
Called Dr Burt Knack ok to give ice chips

## 2015-01-31 NOTE — Progress Notes (Signed)
   01/31/15 1400  Clinical Encounter Type  Visited With Patient  Visit Type Initial  Spiritual Encounters  Spiritual Needs Prayer  Stress Factors  Patient Stress Factors Health changes  Provided pastoral presence and support to patient.  Patient asked me to pray for her silently.  Is having health issues currently and is not feeling very well today.  I told patient I hope she will feel better soon.  [place]

## 2015-01-31 NOTE — Progress Notes (Signed)
CC: Partial small bowel obstruction Subjective: Patient appears angry and agitated. She just had a nasogastric tube placed and is not sure why. She has not vomited, she has not passed any gas but did have a small bowel movement. She denies any abdominal pain at this point her biggest complaint is the presence of the nasogastric tube.  Objective: Vital signs in last 24 hours: Temp:  [97.4 F (36.3 C)-98.5 F (36.9 C)] 97.7 F (36.5 C) (06/14 1359) Pulse Rate:  [69-80] 73 (06/14 1359) Resp:  [11-20] 18 (06/14 1359) BP: (85-133)/(44-87) 122/57 mmHg (06/14 1359) SpO2:  [95 %-100 %] 100 % (06/14 1359) Weight:  [66.225 kg (146 lb)] 66.225 kg (146 lb) (06/13 1851) Last BM Date: 01/30/15  Intake/Output from previous day: 06/13 0701 - 06/14 0700 In: 1072.9 [I.V.:1072.9] Out: 300 [Urine:300] Intake/Output this shift: Total I/O In: -  Out: 225 [Urine:225]  Physical exam:  Angry appearing female patient with a new nasogastric tube that was left open in the bed without any output.  Abdomen is soft non-tympanitic nondistended and nontender,  Extremities without edema and calves are nontender.  Lab Results: CBC   Recent Labs  01/30/15 1321 01/31/15 0543  WBC 10.4 5.6  HGB 14.1 12.5  HCT 42.3 37.3  PLT 298 221   BMET  Recent Labs  01/30/15 1321 01/31/15 0543  NA 136 139  K 3.9 4.0  CL 98* 107  CO2 24 25  GLUCOSE 103* 86  BUN 26* 21*  CREATININE 1.10* 1.07*  CALCIUM 10.1 8.3*   PT/INR No results for input(s): LABPROT, INR in the last 72 hours. ABG No results for input(s): PHART, HCO3 in the last 72 hours.  Invalid input(s): PCO2, PO2  Studies/Results: Dg Abd 1 View  01/31/2015   CLINICAL DATA:  Nasogastric tube placement. Subsequent encounter. Small bowel obstruction.  EXAM: ABDOMEN - 1 VIEW  COMPARISON:  01/31/2015 at 0737 hours.  FINDINGS: Nasogastric tube is present with the tip in the gastric fundus. The proximal side port is at the gastroesophageal junction.  The tube could be advanced 2 were 3 cm to prevent gastroesophageal reflux through the side-port. Dilated loops of small bowel are present. No gross plain film evidence of free air.  IMPRESSION: 1. Nasogastric tube tip in the proximal fundus. 2. Side port of the nasogastric tube is at the gastroesophageal junction. The tube could be advanced between 2 cm and 3 cm for better positioning.   Electronically Signed   By: Dereck Ligas M.D.   On: 01/31/2015 15:20   Abd 1 View (kub)  01/31/2015   CLINICAL DATA:  Small bowel obstruction follow-up.  EXAM: ABDOMEN - 1 VIEW  COMPARISON:  01/30/2015 CT  FINDINGS: Dilated gas-filled small bowel loops are again noted.  A small amount of gas in the colon is present.  There has been little interval change since the prior study.  Degenerative changes in the lumbar spine and hips are again identified.  IMPRESSION: Unchanged distended small bowel loops suspicious for small bowel obstruction.   Electronically Signed   By: Margarette Canada M.D.   On: 01/31/2015 08:15   Ct Angio Chest Aorta W/cm &/or Wo/cm  01/30/2015   CLINICAL DATA:  Low back and epigastric pain. History of back surgery, breast carcinoma, cholecystectomy.  EXAM: CT ANGIOGRAPHY CHEST, ABDOMEN AND PELVIS  TECHNIQUE: Multidetector CT imaging through the chest, abdomen and pelvis was performed using the standard protocol during bolus administration of intravenous contrast. Multiplanar reconstructed images and MIPs were obtained  and reviewed to evaluate the vascular anatomy.  CONTRAST:  116mL OMNIPAQUE IOHEXOL 350 MG/ML SOLN  COMPARISON:  11/10/2013  FINDINGS: CTA CHEST  The noncontrast scout shows coronary and aortic calcifications. No hyperdense crescent or mediastinal hematoma. No pleural or pericardial effusion. No pneumothorax.  CTA via left arm injection. The SVC is patent. Satisfactory opacification of pulmonary arteries noted, and there is no evidence of pulmonary emboli. Patent bilateral pulmonary veins. Adequate  contrast opacification of the thoracic aorta with no evidence of dissection, aneurysm, or stenosis. There is patent proximal brachiocephalic arch anatomy without proximal stenosis. The left vertebral artery arises directly from the arch, an anatomic variant.  No hilar or mediastinal adenopathy. Mild pulmonary emphysematous changes. No nodule or focal airspace consolidation. Stable sclerotic lesions in multiple thoracic vertebral bodies and the sternum, as well as a partially lytic T7 lesion as before.  Review of the MIP images confirms the above findings.  CTA ABDOMEN AND PELVIS  Arterial findings:  Aorta: Scattered calcified plaque predominately in the infrarenal segment. Mildly ectatic infrarenal segment up to 2.6 cm transverse diameter. There is some eccentric mural thrombus with focal penetrating atheromatous ulcers posteriorly in the ectatic segment. No stenosis.  Celiac axis:         Patent  Superior mesenteric: Patent, classic distal branch anatomy.  Left renal:          Single with proximal bifurcation, patent  Right renal: Partially calcified ostial plaque over a length of less than 1 cm resulting in at least mild stenosis, patent distally.  Inferior mesenteric: Patent, arising from the ectatic segment  Left iliac: Heavy eccentric plaque through the common iliac, which is ectatic distally up to 14 mm diameter. External iliac is widely patent. Heavy atheromatous calcified plaque in the internal iliac.  Right iliac: Eccentric nonocclusive plaque through the common iliac and internal iliac. The external iliac is widely patent.  Venous findings: Dedicated venous phase imaging not obtained. Patent bilateral renal veins noted.  Review of the MIP images confirms the above findings.  Nonvascular findings: Unremarkable arterial phase evaluation of liver, spleen, adrenal glands, kidneys, pancreas. Stomach is nondistended. There is mild distention of multiple mid abdominal small bowel loops. Distal small bowel is  decompressed. No discrete transition zone is evident. Small bowel anastomotic staple line in the pelvis. Normal appendix. The colon is nondilated. Scattered sigmoid diverticula without adjacent inflammatory/edematous change. Urinary bladder physiologically distended. No ascites. No free air. Changes of ventral hernia repair with decrease in amount of fluid adjacent to the mesh since previous exam. Multiple scattered sclerotic lesions in the lumbar spine and pelvis are stable. Degenerative disc disease most marked L4-5 and L5-S1.  IMPRESSION: 1. Negative for acute PE or thoracic aortic dissection. 2. Multiple distended proximal and mid abdominal small bowel loops suggesting early/partial obstruction, without discrete transition point or etiology. 3. Multiple sclerotic osseous lesions, and mixed lytic/ sclerotic T7 lesion, stable since 11/10/2013. 4. Ectatic abdominal aorta at risk for aneurysm development. Recommend follow up by [place] in 5 years. This recommendation follows ACR consensus guidelines: White Paper of the ACR Incidental Findings Committee II on Vascular Findings. J Am Coll Radiol 2013; 10:789-794.   Electronically Signed   By: Lucrezia Europe M.D.   On: 01/30/2015 16:36   Ct Cta Abd/pel W/cm &/or W/o Cm  01/30/2015   CLINICAL DATA:  Low back and epigastric pain. History of back surgery, breast carcinoma, cholecystectomy.  EXAM: CT ANGIOGRAPHY CHEST, ABDOMEN AND PELVIS  TECHNIQUE: Multidetector CT imaging through  the chest, abdomen and pelvis was performed using the standard protocol during bolus administration of intravenous contrast. Multiplanar reconstructed images and MIPs were obtained and reviewed to evaluate the vascular anatomy.  CONTRAST:  163mL OMNIPAQUE IOHEXOL 350 MG/ML SOLN  COMPARISON:  11/10/2013  FINDINGS: CTA CHEST  The noncontrast scout shows coronary and aortic calcifications. No hyperdense crescent or mediastinal hematoma. No pleural or pericardial effusion. No pneumothorax.  CTA via left  arm injection. The SVC is patent. Satisfactory opacification of pulmonary arteries noted, and there is no evidence of pulmonary emboli. Patent bilateral pulmonary veins. Adequate contrast opacification of the thoracic aorta with no evidence of dissection, aneurysm, or stenosis. There is patent proximal brachiocephalic arch anatomy without proximal stenosis. The left vertebral artery arises directly from the arch, an anatomic variant.  No hilar or mediastinal adenopathy. Mild pulmonary emphysematous changes. No nodule or focal airspace consolidation. Stable sclerotic lesions in multiple thoracic vertebral bodies and the sternum, as well as a partially lytic T7 lesion as before.  Review of the MIP images confirms the above findings.  CTA ABDOMEN AND PELVIS  Arterial findings:  Aorta: Scattered calcified plaque predominately in the infrarenal segment. Mildly ectatic infrarenal segment up to 2.6 cm transverse diameter. There is some eccentric mural thrombus with focal penetrating atheromatous ulcers posteriorly in the ectatic segment. No stenosis.  Celiac axis:         Patent  Superior mesenteric: Patent, classic distal branch anatomy.  Left renal:          Single with proximal bifurcation, patent  Right renal: Partially calcified ostial plaque over a length of less than 1 cm resulting in at least mild stenosis, patent distally.  Inferior mesenteric: Patent, arising from the ectatic segment  Left iliac: Heavy eccentric plaque through the common iliac, which is ectatic distally up to 14 mm diameter. External iliac is widely patent. Heavy atheromatous calcified plaque in the internal iliac.  Right iliac: Eccentric nonocclusive plaque through the common iliac and internal iliac. The external iliac is widely patent.  Venous findings: Dedicated venous phase imaging not obtained. Patent bilateral renal veins noted.  Review of the MIP images confirms the above findings.  Nonvascular findings: Unremarkable arterial phase  evaluation of liver, spleen, adrenal glands, kidneys, pancreas. Stomach is nondistended. There is mild distention of multiple mid abdominal small bowel loops. Distal small bowel is decompressed. No discrete transition zone is evident. Small bowel anastomotic staple line in the pelvis. Normal appendix. The colon is nondilated. Scattered sigmoid diverticula without adjacent inflammatory/edematous change. Urinary bladder physiologically distended. No ascites. No free air. Changes of ventral hernia repair with decrease in amount of fluid adjacent to the mesh since previous exam. Multiple scattered sclerotic lesions in the lumbar spine and pelvis are stable. Degenerative disc disease most marked L4-5 and L5-S1.  IMPRESSION: 1. Negative for acute PE or thoracic aortic dissection. 2. Multiple distended proximal and mid abdominal small bowel loops suggesting early/partial obstruction, without discrete transition point or etiology. 3. Multiple sclerotic osseous lesions, and mixed lytic/ sclerotic T7 lesion, stable since 11/10/2013. 4. Ectatic abdominal aorta at risk for aneurysm development. Recommend follow up by [place] in 5 years. This recommendation follows ACR consensus guidelines: White Paper of the ACR Incidental Findings Committee II on Vascular Findings. J Am Coll Radiol 2013; 10:789-794.   Electronically Signed   By: Lucrezia Europe M.D.   On: 01/30/2015 16:36    Anti-infectives: Anti-infectives    None      Assessment/Plan:  Patient admitted  with partial small bowel obstruction on abdominal films my personal review of the abdominal films, suggest that she has gas in her right colon and transverse colon. She has not vomited but had a nasogastric tube placed for unclear reasons. At this point I would leave it in place and repeat films tomorrow morning and possibly use a Dulcolax suppository today.  Florene Glen, MD, FACS  01/31/2015

## 2015-01-31 NOTE — Plan of Care (Signed)
Problem: Discharge Progression Outcomes Goal: Other Discharge Outcomes/Goals Outcome: Progressing Pain c/o lower ABD pain well controlled with prn pain meds  Pts c/o nausea x1 relieved with prn Zofran  VSS Paitent remains NPO except for ice chips and sips of water with meds Patient is a standby assist to the BR and has one moderate formed stool today

## 2015-01-31 NOTE — Progress Notes (Signed)
Called Dr Benjie Karvonen about results of NG tube placement x-ray, she said to advance tube 3cm more, no need to re-xray and the hook to low continuous suction

## 2015-01-31 NOTE — Progress Notes (Signed)
Dr. Reece Levy notified of patients c/o itching. New order received for Benadryl 25mg  PO once now.

## 2015-01-31 NOTE — Plan of Care (Signed)
Problem: Discharge Progression Outcomes Goal: Discharge plan in place and appropriate Individualization: Patient goes by Sara David and is from home with her husband. Admitted with possible small bowel obstruction- IVF infusing per MD order, labs monitored, off unit telemetry NSR HR 76. Past medical history of HTN currently controlled with home medication. Other medical history reported to have cholesystectomy 5weeks ago, lower back surgery 3weeks ago, and history of breast cancer with mets, no treatment now per report.      Goal: Other Discharge Outcomes/Goals Outcome: Progressing Plan of care progress to goals: Pain- Hemodynamically stable- Complications- Diet-

## 2015-01-31 NOTE — Plan of Care (Signed)
Problem: Discharge Progression Outcomes Goal: Other Discharge Outcomes/Goals Outcome: Progressing Patient has been A&O this shift Patient is up to chair for meals with a 2 person assist No c/o pain this shift Neuro checks are WNL Patients appetite is poor  Possible discharge with home Health

## 2015-01-31 NOTE — Progress Notes (Signed)
Dr. Jannifer Franklin notified of continuous pulse oximetry order, pt is on room air and oxygen saturation is 95%, no difficulty breathing or shortness of breath. Order obtained, ok to discontinue continuous pulse ox and continue to check with vital signs.

## 2015-01-31 NOTE — Plan of Care (Signed)
Problem: Discharge Progression Outcomes Goal: Discharge plan in place and appropriate Individualization: Patient goes by Sara David, from home with her husband and her 69y/o mother. Patient also watches her 2 great-grandchildren during the day, ages 66 and 54. Admitted with possible small bowel obstruction- IVF infusing per MD order, labs monitored, off unit telemetry NSR HR 76. Past medical history of HTN currently controlled with home medication. Other medical history reported to have cholesystectomy 5weeks ago, lower back surgery 3weeks ago, and history of breast cancer with mets, no treatment now per report.          Goal: Other Discharge Outcomes/Goals Outcome: Progressing Individualization: Patient goes by Sara David and is from home with her husband. Admitted with possible small bowel obstruction- IVF infusing per MD order, labs monitored, off unit telemetry NSR HR 76. Past medical history of HTN currently controlled with home medication. Other medical history reported to have cholesystectomy 5weeks ago, lower back surgery 3weeks ago, and history of breast cancer with mets, no treatment now per report. Plan of care progress to goals: Pain- c/o abd pain improved with PO PRN pain medications. Hemodynamically stable- BUN and creatinine elevated, IVF infusing per MD order, VSS. Diet-NPO except sips with medications, c/o nausea improved with IV PRN zofran.  Activity- Moderate fall risk, up to BR with assistance.  Surgical consult completed on 01/30/2015 by Dr. Pat Patrick Pt is scheduled for KUB this morning. Off unit telemetry reports NSR, HR 70's.

## 2015-01-31 NOTE — Plan of Care (Signed)
Problem: Discharge Progression Outcomes Goal: Tolerating diet Outcome: Progressing Patient has had no nausea or vomiting Patient has a poor appetite

## 2015-01-31 NOTE — Care Management (Signed)
Admitted to [facility] with the diagnosis of possible small bowel obstruction. Lives with husband, Quita Skye, [phone]). States her husband is hard of hearing, in needed quickly call daughter, Jeannene Patella. No home health. No skilled facility. No home oxygen. Takes care of all activities of daily living herself, still drives and takes care of her great grand children. Uses no aids for ambulation.  Sees Dr. Emily Filbert. Seen yesterday. Either daughter or husband will transport. [place] RN MSN Care Management [phone]

## 2015-01-31 NOTE — Progress Notes (Signed)
Subjective:   Nasogastric tube placed this a.m. with minimal output today 8. She did have a bowel movement 2 today but is not passing any gas. She does not have any significant abdominal pain. She is very upset about the nasogastric tube and the plan.  Single view abdominal film today shows significant small bowel gas distention but there is gas in the right and transverse colons.  Vital signs in last 24 hours: Temp:  [97.4 F (36.3 C)-98.5 F (36.9 C)] 97.7 F (36.5 C) (06/14 1359) Pulse Rate:  [69-78] 73 (06/14 1359) Resp:  [18-20] 18 (06/14 1359) BP: (85-125)/(44-57) 122/57 mmHg (06/14 1359) SpO2:  [95 %-100 %] 100 % (06/14 1359) Last BM Date: 01/31/15  Intake/Output from previous day: 06/13 0701 - 06/14 0700 In: 1072.9 [I.V.:1072.9] Out: 300 [Urine:300]  Exam:  She does not have any significant abdominal pain or tenderness. She does not have any abdominal distention. She has active bowel sounds. She again is agitated and angry.  Lab Results:  CBC  Recent Labs  01/30/15 1321 01/31/15 0543  WBC 10.4 5.6  HGB 14.1 12.5  HCT 42.3 37.3  PLT 298 221   CMP     Component Value Date/Time   NA 139 01/31/2015 0543   NA 138 11/15/2013 1327   K 4.0 01/31/2015 0543   K 3.5 11/15/2013 1327   CL 107 01/31/2015 0543   CL 98 11/15/2013 1327   CO2 25 01/31/2015 0543   CO2 29 11/15/2013 1327   GLUCOSE 86 01/31/2015 0543   GLUCOSE 134* 11/15/2013 1327   BUN 21* 01/31/2015 0543   BUN 18 11/15/2013 1327   CREATININE 1.07* 01/31/2015 0543   CREATININE 1.34* 11/15/2013 1327   CALCIUM 8.3* 01/31/2015 0543   CALCIUM 9.5 11/15/2013 1327   PROT 8.2* 01/30/2015 1321   ALBUMIN 4.7 01/30/2015 1321   AST 24 01/30/2015 1321   ALT 18 01/30/2015 1321   ALKPHOS 80 01/30/2015 1321   BILITOT 0.6 01/30/2015 1321   GFRNONAA 52* 01/31/2015 0543   GFRNONAA 41* 11/15/2013 1327   GFRAA >60 01/31/2015 0543   GFRAA 47* 11/15/2013 1327   PT/INR No results for input(s): LABPROT, INR in the  last 72 hours.  Studies/Results: Dg Abd 1 View  01/31/2015   CLINICAL DATA:  Nasogastric tube placement. Subsequent encounter. Small bowel obstruction.  EXAM: ABDOMEN - 1 VIEW  COMPARISON:  01/31/2015 at 0737 hours.  FINDINGS: Nasogastric tube is present with the tip in the gastric fundus. The proximal side port is at the gastroesophageal junction. The tube could be advanced 2 were 3 cm to prevent gastroesophageal reflux through the side-port. Dilated loops of small bowel are present. No gross plain film evidence of free air.  IMPRESSION: 1. Nasogastric tube tip in the proximal fundus. 2. Side port of the nasogastric tube is at the gastroesophageal junction. The tube could be advanced between 2 cm and 3 cm for better positioning.   Electronically Signed   By: Dereck Ligas M.D.   On: 01/31/2015 15:20   Abd 1 View (kub)  01/31/2015   CLINICAL DATA:  Small bowel obstruction follow-up.  EXAM: ABDOMEN - 1 VIEW  COMPARISON:  01/30/2015 CT  FINDINGS: Dilated gas-filled small bowel loops are again noted.  A small amount of gas in the colon is present.  There has been little interval change since the prior study.  Degenerative changes in the lumbar spine and hips are again identified.  IMPRESSION: Unchanged distended small bowel loops suspicious for small  bowel obstruction.   Electronically Signed   By: Margarette Canada M.D.   On: 01/31/2015 08:15   Ct Angio Chest Aorta W/cm &/or Wo/cm  01/30/2015   CLINICAL DATA:  Low back and epigastric pain. History of back surgery, breast carcinoma, cholecystectomy.  EXAM: CT ANGIOGRAPHY CHEST, ABDOMEN AND PELVIS  TECHNIQUE: Multidetector CT imaging through the chest, abdomen and pelvis was performed using the standard protocol during bolus administration of intravenous contrast. Multiplanar reconstructed images and MIPs were obtained and reviewed to evaluate the vascular anatomy.  CONTRAST:  127mL OMNIPAQUE IOHEXOL 350 MG/ML SOLN  COMPARISON:  11/10/2013  FINDINGS: CTA CHEST   The noncontrast scout shows coronary and aortic calcifications. No hyperdense crescent or mediastinal hematoma. No pleural or pericardial effusion. No pneumothorax.  CTA via left arm injection. The SVC is patent. Satisfactory opacification of pulmonary arteries noted, and there is no evidence of pulmonary emboli. Patent bilateral pulmonary veins. Adequate contrast opacification of the thoracic aorta with no evidence of dissection, aneurysm, or stenosis. There is patent proximal brachiocephalic arch anatomy without proximal stenosis. The left vertebral artery arises directly from the arch, an anatomic variant.  No hilar or mediastinal adenopathy. Mild pulmonary emphysematous changes. No nodule or focal airspace consolidation. Stable sclerotic lesions in multiple thoracic vertebral bodies and the sternum, as well as a partially lytic T7 lesion as before.  Review of the MIP images confirms the above findings.  CTA ABDOMEN AND PELVIS  Arterial findings:  Aorta: Scattered calcified plaque predominately in the infrarenal segment. Mildly ectatic infrarenal segment up to 2.6 cm transverse diameter. There is some eccentric mural thrombus with focal penetrating atheromatous ulcers posteriorly in the ectatic segment. No stenosis.  Celiac axis:         Patent  Superior mesenteric: Patent, classic distal branch anatomy.  Left renal:          Single with proximal bifurcation, patent  Right renal: Partially calcified ostial plaque over a length of less than 1 cm resulting in at least mild stenosis, patent distally.  Inferior mesenteric: Patent, arising from the ectatic segment  Left iliac: Heavy eccentric plaque through the common iliac, which is ectatic distally up to 14 mm diameter. External iliac is widely patent. Heavy atheromatous calcified plaque in the internal iliac.  Right iliac: Eccentric nonocclusive plaque through the common iliac and internal iliac. The external iliac is widely patent.  Venous findings: Dedicated  venous phase imaging not obtained. Patent bilateral renal veins noted.  Review of the MIP images confirms the above findings.  Nonvascular findings: Unremarkable arterial phase evaluation of liver, spleen, adrenal glands, kidneys, pancreas. Stomach is nondistended. There is mild distention of multiple mid abdominal small bowel loops. Distal small bowel is decompressed. No discrete transition zone is evident. Small bowel anastomotic staple line in the pelvis. Normal appendix. The colon is nondilated. Scattered sigmoid diverticula without adjacent inflammatory/edematous change. Urinary bladder physiologically distended. No ascites. No free air. Changes of ventral hernia repair with decrease in amount of fluid adjacent to the mesh since previous exam. Multiple scattered sclerotic lesions in the lumbar spine and pelvis are stable. Degenerative disc disease most marked L4-5 and L5-S1.  IMPRESSION: 1. Negative for acute PE or thoracic aortic dissection. 2. Multiple distended proximal and mid abdominal small bowel loops suggesting early/partial obstruction, without discrete transition point or etiology. 3. Multiple sclerotic osseous lesions, and mixed lytic/ sclerotic T7 lesion, stable since 11/10/2013. 4. Ectatic abdominal aorta at risk for aneurysm development. Recommend follow up by [place] in  5 years. This recommendation follows ACR consensus guidelines: White Paper of the ACR Incidental Findings Committee II on Vascular Findings. J Am Coll Radiol 2013; 10:789-794.   Electronically Signed   By: Lucrezia Europe M.D.   On: 01/30/2015 16:36   Ct Cta Abd/pel W/cm &/or W/o Cm  01/30/2015   CLINICAL DATA:  Low back and epigastric pain. History of back surgery, breast carcinoma, cholecystectomy.  EXAM: CT ANGIOGRAPHY CHEST, ABDOMEN AND PELVIS  TECHNIQUE: Multidetector CT imaging through the chest, abdomen and pelvis was performed using the standard protocol during bolus administration of intravenous contrast. Multiplanar  reconstructed images and MIPs were obtained and reviewed to evaluate the vascular anatomy.  CONTRAST:  164mL OMNIPAQUE IOHEXOL 350 MG/ML SOLN  COMPARISON:  11/10/2013  FINDINGS: CTA CHEST  The noncontrast scout shows coronary and aortic calcifications. No hyperdense crescent or mediastinal hematoma. No pleural or pericardial effusion. No pneumothorax.  CTA via left arm injection. The SVC is patent. Satisfactory opacification of pulmonary arteries noted, and there is no evidence of pulmonary emboli. Patent bilateral pulmonary veins. Adequate contrast opacification of the thoracic aorta with no evidence of dissection, aneurysm, or stenosis. There is patent proximal brachiocephalic arch anatomy without proximal stenosis. The left vertebral artery arises directly from the arch, an anatomic variant.  No hilar or mediastinal adenopathy. Mild pulmonary emphysematous changes. No nodule or focal airspace consolidation. Stable sclerotic lesions in multiple thoracic vertebral bodies and the sternum, as well as a partially lytic T7 lesion as before.  Review of the MIP images confirms the above findings.  CTA ABDOMEN AND PELVIS  Arterial findings:  Aorta: Scattered calcified plaque predominately in the infrarenal segment. Mildly ectatic infrarenal segment up to 2.6 cm transverse diameter. There is some eccentric mural thrombus with focal penetrating atheromatous ulcers posteriorly in the ectatic segment. No stenosis.  Celiac axis:         Patent  Superior mesenteric: Patent, classic distal branch anatomy.  Left renal:          Single with proximal bifurcation, patent  Right renal: Partially calcified ostial plaque over a length of less than 1 cm resulting in at least mild stenosis, patent distally.  Inferior mesenteric: Patent, arising from the ectatic segment  Left iliac: Heavy eccentric plaque through the common iliac, which is ectatic distally up to 14 mm diameter. External iliac is widely patent. Heavy atheromatous calcified  plaque in the internal iliac.  Right iliac: Eccentric nonocclusive plaque through the common iliac and internal iliac. The external iliac is widely patent.  Venous findings: Dedicated venous phase imaging not obtained. Patent bilateral renal veins noted.  Review of the MIP images confirms the above findings.  Nonvascular findings: Unremarkable arterial phase evaluation of liver, spleen, adrenal glands, kidneys, pancreas. Stomach is nondistended. There is mild distention of multiple mid abdominal small bowel loops. Distal small bowel is decompressed. No discrete transition zone is evident. Small bowel anastomotic staple line in the pelvis. Normal appendix. The colon is nondilated. Scattered sigmoid diverticula without adjacent inflammatory/edematous change. Urinary bladder physiologically distended. No ascites. No free air. Changes of ventral hernia repair with decrease in amount of fluid adjacent to the mesh since previous exam. Multiple scattered sclerotic lesions in the lumbar spine and pelvis are stable. Degenerative disc disease most marked L4-5 and L5-S1.  IMPRESSION: 1. Negative for acute PE or thoracic aortic dissection. 2. Multiple distended proximal and mid abdominal small bowel loops suggesting early/partial obstruction, without discrete transition point or etiology. 3. Multiple sclerotic osseous lesions,  and mixed lytic/ sclerotic T7 lesion, stable since 11/10/2013. 4. Ectatic abdominal aorta at risk for aneurysm development. Recommend follow up by [place] in 5 years. This recommendation follows ACR consensus guidelines: White Paper of the ACR Incidental Findings Committee II on Vascular Findings. J Am Coll Radiol 2013; 10:789-794.   Electronically Signed   By: Lucrezia Europe M.D.   On: 01/30/2015 16:36    Assessment/Plan: Her plain films demonstrate some small bowel distention but there is air in the colon. She has a known history of small bowel obstruction from her previous metastatic carcinoma. At her  robotic cholecystectomy in March she was noted to have dense adhesions in the midline from her previous ventral hernia repair and mesh placement. She can easily have adhesive disease as the source of a bowel obstruction. However with her current findings is very difficult to make a decision with regard to intervention.  I would continue her nasogastric tube over the course of this next evening to be certain it does not solve some of her symptoms. I have ordered a CT scan with by mouth contrast tomorrow to look at whether or not she has luminal obstruction. I discussed this plan with the patient and her husband in detail and they are in agreement.

## 2015-01-31 NOTE — Progress Notes (Signed)
[facility] [place] NAME: Sara David    MR#:  [id number]  DATE OF BIRTH:  1946-07-04  SUBJECTIVE:  Patient reports abdominal pain and nausea. Patient says she will have an NG tube placed if this will help her with her small bowel obstruction.  REVIEW OF SYSTEMS:    Review of Systems  Constitutional: Negative for fever, chills and malaise/fatigue.  HENT: Negative for sore throat.   Eyes: Negative for blurred vision.  Respiratory: Negative for cough, hemoptysis, shortness of breath and wheezing.   Cardiovascular: Negative for chest pain, palpitations and leg swelling.  Gastrointestinal: Positive for nausea and abdominal pain. Negative for vomiting, diarrhea, constipation and blood in stool.  Genitourinary: Negative for dysuria.  Musculoskeletal: Negative for back pain.  Neurological: Negative for dizziness, tremors and headaches.  Endo/Heme/Allergies: Does not bruise/bleed easily.    Tolerating Diet: Nothing by mouth      DRUG ALLERGIES:   Allergies  Allergen Reactions  . Erythromycin Diarrhea and Nausea And Vomiting  . Latex Rash  . Tape Itching and Rash  . Wellbutrin [Bupropion] Rash    VITALS:  Blood pressure 88/50, pulse 78, temperature 98.2 F (36.8 C), temperature source Oral, resp. rate 19, height 5\' 4"  (1.626 m), weight 66.225 kg (146 lb), SpO2 97 %.  PHYSICAL EXAMINATION:   Physical Exam  Constitutional: She is oriented to person, place, and time and well-developed, well-nourished, and in no distress. No distress.  HENT:  Head: Normocephalic.  Eyes: No scleral icterus.  Neck: Normal range of motion. Neck supple. No JVD present. No tracheal deviation present.  Cardiovascular: Normal rate, regular rhythm and normal heart sounds.  Exam reveals no gallop and no friction rub.   No murmur heard. Pulmonary/Chest: Effort normal and breath sounds normal. No respiratory distress. She has no wheezes. She has no  rales. She exhibits no tenderness.  Abdominal: She exhibits distension. She exhibits no mass. There is tenderness. There is no rebound and no guarding.  Musculoskeletal: Normal range of motion. She exhibits no edema.  Neurological: She is alert and oriented to person, place, and time.  Skin: Skin is warm. No rash noted. No erythema.  Psychiatric: Affect and judgment normal.      LABORATORY PANEL:   CBC  Recent Labs Lab 01/31/15 0543  WBC 5.6  HGB 12.5  HCT 37.3  PLT 221   ------------------------------------------------------------------------------------------------------------------  Chemistries   Recent Labs Lab 01/30/15 1321 01/31/15 0543  NA 136 139  K 3.9 4.0  CL 98* 107  CO2 24 25  GLUCOSE 103* 86  BUN 26* 21*  CREATININE 1.10* 1.07*  CALCIUM 10.1 8.3*  AST 24  --   ALT 18  --   ALKPHOS 80  --   BILITOT 0.6  --    ------------------------------------------------------------------------------------------------------------------  Cardiac Enzymes  Recent Labs Lab 01/30/15 1838 01/30/15 2220 01/31/15 0221  TROPONINI <0.03 <0.03 <0.03   ------------------------------------------------------------------------------------------------------------------  RADIOLOGY:  Abd 1 View (kub)  01/31/2015   CLINICAL DATA:  Small bowel obstruction follow-up.  EXAM: ABDOMEN - 1 VIEW  COMPARISON:  01/30/2015 CT  FINDINGS: Dilated gas-filled small bowel loops are again noted.  A small amount of gas in the colon is present.  There has been little interval change since the prior study.  Degenerative changes in the lumbar spine and hips are again identified.  IMPRESSION: Unchanged distended small bowel loops suspicious for small bowel obstruction.   Electronically Signed   By: Margarette Canada  M.D.   On: 01/31/2015 08:15   Ct Angio Chest Aorta W/cm &/or Wo/cm  01/30/2015   CLINICAL DATA:  Low back and epigastric pain. History of back surgery, breast carcinoma, cholecystectomy.   EXAM: CT ANGIOGRAPHY CHEST, ABDOMEN AND PELVIS  TECHNIQUE: Multidetector CT imaging through the chest, abdomen and pelvis was performed using the standard protocol during bolus administration of intravenous contrast. Multiplanar reconstructed images and MIPs were obtained and reviewed to evaluate the vascular anatomy.  CONTRAST:  168mL OMNIPAQUE IOHEXOL 350 MG/ML SOLN  COMPARISON:  11/10/2013  FINDINGS: CTA CHEST  The noncontrast scout shows coronary and aortic calcifications. No hyperdense crescent or mediastinal hematoma. No pleural or pericardial effusion. No pneumothorax.  CTA via left arm injection. The SVC is patent. Satisfactory opacification of pulmonary arteries noted, and there is no evidence of pulmonary emboli. Patent bilateral pulmonary veins. Adequate contrast opacification of the thoracic aorta with no evidence of dissection, aneurysm, or stenosis. There is patent proximal brachiocephalic arch anatomy without proximal stenosis. The left vertebral artery arises directly from the arch, an anatomic variant.  No hilar or mediastinal adenopathy. Mild pulmonary emphysematous changes. No nodule or focal airspace consolidation. Stable sclerotic lesions in multiple thoracic vertebral bodies and the sternum, as well as a partially lytic T7 lesion as before.  Review of the MIP images confirms the above findings.  CTA ABDOMEN AND PELVIS  Arterial findings:  Aorta: Scattered calcified plaque predominately in the infrarenal segment. Mildly ectatic infrarenal segment up to 2.6 cm transverse diameter. There is some eccentric mural thrombus with focal penetrating atheromatous ulcers posteriorly in the ectatic segment. No stenosis.  Celiac axis:         Patent  Superior mesenteric: Patent, classic distal branch anatomy.  Left renal:          Single with proximal bifurcation, patent  Right renal: Partially calcified ostial plaque over a length of less than 1 cm resulting in at least mild stenosis, patent distally.   Inferior mesenteric: Patent, arising from the ectatic segment  Left iliac: Heavy eccentric plaque through the common iliac, which is ectatic distally up to 14 mm diameter. External iliac is widely patent. Heavy atheromatous calcified plaque in the internal iliac.  Right iliac: Eccentric nonocclusive plaque through the common iliac and internal iliac. The external iliac is widely patent.  Venous findings: Dedicated venous phase imaging not obtained. Patent bilateral renal veins noted.  Review of the MIP images confirms the above findings.  Nonvascular findings: Unremarkable arterial phase evaluation of liver, spleen, adrenal glands, kidneys, pancreas. Stomach is nondistended. There is mild distention of multiple mid abdominal small bowel loops. Distal small bowel is decompressed. No discrete transition zone is evident. Small bowel anastomotic staple line in the pelvis. Normal appendix. The colon is nondilated. Scattered sigmoid diverticula without adjacent inflammatory/edematous change. Urinary bladder physiologically distended. No ascites. No free air. Changes of ventral hernia repair with decrease in amount of fluid adjacent to the mesh since previous exam. Multiple scattered sclerotic lesions in the lumbar spine and pelvis are stable. Degenerative disc disease most marked L4-5 and L5-S1.  IMPRESSION: 1. Negative for acute PE or thoracic aortic dissection. 2. Multiple distended proximal and mid abdominal small bowel loops suggesting early/partial obstruction, without discrete transition point or etiology. 3. Multiple sclerotic osseous lesions, and mixed lytic/ sclerotic T7 lesion, stable since 11/10/2013. 4. Ectatic abdominal aorta at risk for aneurysm development. Recommend follow up by [place] in 5 years. This recommendation follows ACR consensus guidelines: White Paper of the  ACR Incidental Findings Committee II on Vascular Findings. J Am Coll Radiol 2013; 10:789-794.   Electronically Signed   By: Lucrezia Europe M.D.    On: 01/30/2015 16:36   Ct Cta Abd/pel W/cm &/or W/o Cm  01/30/2015   CLINICAL DATA:  Low back and epigastric pain. History of back surgery, breast carcinoma, cholecystectomy.  EXAM: CT ANGIOGRAPHY CHEST, ABDOMEN AND PELVIS  TECHNIQUE: Multidetector CT imaging through the chest, abdomen and pelvis was performed using the standard protocol during bolus administration of intravenous contrast. Multiplanar reconstructed images and MIPs were obtained and reviewed to evaluate the vascular anatomy.  CONTRAST:  140mL OMNIPAQUE IOHEXOL 350 MG/ML SOLN  COMPARISON:  11/10/2013  FINDINGS: CTA CHEST  The noncontrast scout shows coronary and aortic calcifications. No hyperdense crescent or mediastinal hematoma. No pleural or pericardial effusion. No pneumothorax.  CTA via left arm injection. The SVC is patent. Satisfactory opacification of pulmonary arteries noted, and there is no evidence of pulmonary emboli. Patent bilateral pulmonary veins. Adequate contrast opacification of the thoracic aorta with no evidence of dissection, aneurysm, or stenosis. There is patent proximal brachiocephalic arch anatomy without proximal stenosis. The left vertebral artery arises directly from the arch, an anatomic variant.  No hilar or mediastinal adenopathy. Mild pulmonary emphysematous changes. No nodule or focal airspace consolidation. Stable sclerotic lesions in multiple thoracic vertebral bodies and the sternum, as well as a partially lytic T7 lesion as before.  Review of the MIP images confirms the above findings.  CTA ABDOMEN AND PELVIS  Arterial findings:  Aorta: Scattered calcified plaque predominately in the infrarenal segment. Mildly ectatic infrarenal segment up to 2.6 cm transverse diameter. There is some eccentric mural thrombus with focal penetrating atheromatous ulcers posteriorly in the ectatic segment. No stenosis.  Celiac axis:         Patent  Superior mesenteric: Patent, classic distal branch anatomy.  Left renal:           Single with proximal bifurcation, patent  Right renal: Partially calcified ostial plaque over a length of less than 1 cm resulting in at least mild stenosis, patent distally.  Inferior mesenteric: Patent, arising from the ectatic segment  Left iliac: Heavy eccentric plaque through the common iliac, which is ectatic distally up to 14 mm diameter. External iliac is widely patent. Heavy atheromatous calcified plaque in the internal iliac.  Right iliac: Eccentric nonocclusive plaque through the common iliac and internal iliac. The external iliac is widely patent.  Venous findings: Dedicated venous phase imaging not obtained. Patent bilateral renal veins noted.  Review of the MIP images confirms the above findings.  Nonvascular findings: Unremarkable arterial phase evaluation of liver, spleen, adrenal glands, kidneys, pancreas. Stomach is nondistended. There is mild distention of multiple mid abdominal small bowel loops. Distal small bowel is decompressed. No discrete transition zone is evident. Small bowel anastomotic staple line in the pelvis. Normal appendix. The colon is nondilated. Scattered sigmoid diverticula without adjacent inflammatory/edematous change. Urinary bladder physiologically distended. No ascites. No free air. Changes of ventral hernia repair with decrease in amount of fluid adjacent to the mesh since previous exam. Multiple scattered sclerotic lesions in the lumbar spine and pelvis are stable. Degenerative disc disease most marked L4-5 and L5-S1.  IMPRESSION: 1. Negative for acute PE or thoracic aortic dissection. 2. Multiple distended proximal and mid abdominal small bowel loops suggesting early/partial obstruction, without discrete transition point or etiology. 3. Multiple sclerotic osseous lesions, and mixed lytic/ sclerotic T7 lesion, stable since 11/10/2013. 4. Ectatic abdominal  aorta at risk for aneurysm development. Recommend follow up by [place] in 5 years. This recommendation follows ACR  consensus guidelines: White Paper of the ACR Incidental Findings Committee II on Vascular Findings. J Am Coll Radiol 2013; 10:789-794.   Electronically Signed   By: Lucrezia Europe M.D.   On: 01/30/2015 16:36     ASSESSMENT AND PLAN:   This is a 69 year old female with a history of metastatic breast carcinoma and small bowel obstruction who presented with abdominal pain and found to have a partial/complete small bowel obstruction.  1. Partial small bowel obstruction: Today's KUB does show obstruction. Patient does state that she will have an NG tube placed. She refused yesterday. Patient has been seen and evaluated by surgery yesterday who felt that she did not have active evidence for small ulceration.. I will place an NG tube to suction. Surgery will continue to follow. Patient will continue on nothing by mouth status.  2. Stage IV metastatic breast cancer: Patient will continue femora  3. Recent external otitis infection: Patient will continue on Cipro optic suspension.  4. Hypothyroidism: Patient will continue on Synthroid 100 mg daily. 5. Depression: Patient will continue citalopram  6. Hyperlipidemia: Patient is on Pravachol   7. Tobacco dependence: Patient will continue on nicotine patch. She was counseled by the admitting physician.       Management plans discussed with the patient and she is in agreement.  CODE STATUS: Full  TOTAL TIME TAKING CARE OF THIS PATIENT: 6 minutes.   Greater than 50% counseling and coordination of care  POSSIBLE D/C 2-3 days , DEPENDING ON CLINICAL CONDITION.   Elyas Villamor M.D on 01/31/2015 at 11:24 AM  Between 7am to 6pm - Pager - [phone] After 6pm go to [email or web address] - password [email or web address] [facility]  Office  [phone]  CC: Primary care physician; Lorenza Evangelist, MD

## 2015-02-01 ENCOUNTER — Inpatient Hospital Stay: Payer: Medicare Other

## 2015-02-01 LAB — BASIC METABOLIC PANEL
Anion gap: 12 (ref 5–15)
BUN: 19 mg/dL (ref 6–20)
CALCIUM: 8.1 mg/dL — AB (ref 8.9–10.3)
CHLORIDE: 110 mmol/L (ref 101–111)
CO2: 18 mmol/L — AB (ref 22–32)
CREATININE: 1.01 mg/dL — AB (ref 0.44–1.00)
GFR calc Af Amer: >60 mL/min (ref >60)
GFR calc non Af Amer: 56 mL/min — ABNORMAL LOW (ref >60)
Glucose, Bld: 57 mg/dL — ABNORMAL LOW (ref 65–99)
Potassium: 4.1 mmol/L (ref 3.5–5.1)
Sodium: 140 mmol/L (ref 135–145)

## 2015-02-01 LAB — GLUCOSE, CAPILLARY
GLUCOSE-CAPILLARY: 118 mg/dL — AB (ref 65–99)
Glucose-Capillary: 45 mg/dL — ABNORMAL LOW (ref 65–99)
Glucose-Capillary: 46 mg/dL — ABNORMAL LOW (ref 65–99)
Glucose-Capillary: 70 mg/dL (ref 65–99)

## 2015-02-01 LAB — CBC WITH DIFFERENTIAL/PLATELET
Basophils Absolute: 0 10*3/uL (ref 0–0.1)
Basophils Relative: 0 %
EOS PCT: 1 %
Eosinophils Absolute: 0.1 10*3/uL (ref 0–0.7)
HCT: 37.7 % (ref 35.0–47.0)
Hemoglobin: 12.3 g/dL (ref 12.0–16.0)
Lymphocytes Relative: 19 %
Lymphs Abs: 1.2 10*3/uL (ref 1.0–3.6)
MCH: 31.1 pg (ref 26.0–34.0)
MCHC: 32.6 g/dL (ref 32.0–36.0)
MCV: 95.4 fL (ref 80.0–100.0)
Monocytes Absolute: 0.4 10*3/uL (ref 0.2–0.9)
Monocytes Relative: 7 %
NEUTROS PCT: 73 %
Neutro Abs: 4.8 10*3/uL (ref 1.4–6.5)
PLATELETS: 228 10*3/uL (ref 150–440)
RBC: 3.95 MIL/uL (ref 3.80–5.20)
RDW: 13.4 % (ref 11.5–14.5)
WBC: 6.5 10*3/uL (ref 3.6–11.0)

## 2015-02-01 MED ORDER — DEXTROSE 50 % IV SOLN: 25.0000 mL | Freq: Once | INTRAVENOUS | Status: DC

## 2015-02-01 MED ORDER — DEXTROSE-NACL 5-0.45 % IV SOLN: INTRAVENOUS | Status: DC

## 2015-02-01 MED ORDER — DEXTROSE 50 % IV SOLN
INTRAVENOUS | Status: AC
  Administered 2015-02-01: 25 mL
  Filled 2015-02-01: qty 50

## 2015-02-01 NOTE — Progress Notes (Signed)
CC: abd pain Subjective: Patient describes minimal abdominal pain is more concerned about her nasogastric tube. The nasogastric tube is causing her considerable discomfort and she wants it removed. She had a small bowel movement and passed gas has not had a single emesis since being in the hospital. CT scan is been personally reviewed and I doubt that there is a mechanical small bowel obstruction present as there is gas and contrast in her colon and rectum.  Objective: Vital signs in last 24 hours: Temp:  [97.6 F (36.4 C)-98.6 F (37 C)] 97.6 F (36.4 C) (06/15 0815) Pulse Rate:  [73-92] 82 (06/15 0815) Resp:  [18-20] 18 (06/15 0815) BP: (122-137)/(49-57) 137/57 mmHg (06/15 0815) SpO2:  [97 %-100 %] 100 % (06/15 0815) Last BM Date: 01/31/15  Intake/Output from previous day: 06/14 0701 - 06/15 0700 In: -  Out: 2375 [Urine:1075; Emesis/NG output:1300] Intake/Output this shift: Total I/O In: -  Out: 100 [Emesis/NG output:100]  Physical exam:  Patient appears angry and uncomfortable Abdomen is soft nondistended nontympanitic and nontender Calves are nontender  Lab Results: CBC   Recent Labs  01/31/15 0543 02/01/15 0506  WBC 5.6 6.5  HGB 12.5 12.3  HCT 37.3 37.7  PLT 221 228   BMET  Recent Labs  01/31/15 0543 02/01/15 0506  NA 139 140  K 4.0 4.1  CL 107 110  CO2 25 18*  GLUCOSE 86 57*  BUN 21* 19  CREATININE 1.07* 1.01*  CALCIUM 8.3* 8.1*   PT/INR No results for input(s): LABPROT, INR in the last 72 hours. ABG No results for input(s): PHART, HCO3 in the last 72 hours.  Invalid input(s): PCO2, PO2  Studies/Results: Ct Abdomen Pelvis Wo Contrast  02/01/2015   CLINICAL DATA:  Acute right lower quadrant abdominal pain.  EXAM: CT ABDOMEN AND PELVIS WITHOUT CONTRAST  TECHNIQUE: Multidetector CT imaging of the abdomen and pelvis was performed following the standard protocol without IV contrast.  COMPARISON:  Radiograph of January 31, 2015; CT scan of January 30, 2015.  FINDINGS: Multilevel degenerative disc disease is noted in the lumbar spine. Stable sclerotic densities are noted in the visualized skeleton. Mild bilateral posterior basilar subsegmental atelectasis is noted.  Status post cholecystectomy. No focal abnormality is noted in the liver, spleen or pancreas on these unenhanced images. Nasogastric tube tip is seen in proximal stomach. Atherosclerosis of abdominal aorta is noted without aneurysm formation. Adrenal glands and kidneys appear normal. No hydronephrosis or renal obstruction is noted. No renal or ureteral calculi are noted. The appendix appears normal. No significant colonic dilatation is noted. Mildly dilated small bowel loops are again noted which may be slightly improved. Is uncertain if this represents ileus or distal small bowel obstruction. No definite transition zone is noted. Postsurgical changes are noted along anterior abdominal wall consistent with prior hernia repair. No significant adenopathy is noted. Small amount of free fluid is noted in the dependent portion of the pelvis.  IMPRESSION: Mild bilateral posterior basilar subsegmental atelectasis.  Nasogastric tube tip is seen in proximal stomach.  No colonic dilatation is noted.  Mildly dilated small bowel loops are again noted, but may be slightly improved compared to prior exam. It is uncertain if this represents ileus or possibly distal small bowel obstruction. Continued radiographic follow-up is recommended.   Electronically Signed   By: Marijo Conception, M.D.   On: 02/01/2015 10:17   Dg Abd 1 View  01/31/2015   CLINICAL DATA:  Nasogastric tube placement. Subsequent encounter. Small bowel  obstruction.  EXAM: ABDOMEN - 1 VIEW  COMPARISON:  01/31/2015 at 0737 hours.  FINDINGS: Nasogastric tube is present with the tip in the gastric fundus. The proximal side port is at the gastroesophageal junction. The tube could be advanced 2 were 3 cm to prevent gastroesophageal reflux through the  side-port. Dilated loops of small bowel are present. No gross plain film evidence of free air.  IMPRESSION: 1. Nasogastric tube tip in the proximal fundus. 2. Side port of the nasogastric tube is at the gastroesophageal junction. The tube could be advanced between 2 cm and 3 cm for better positioning.   Electronically Signed   By: Dereck Ligas M.D.   On: 01/31/2015 15:20   Abd 1 View (kub)  01/31/2015   CLINICAL DATA:  Small bowel obstruction follow-up.  EXAM: ABDOMEN - 1 VIEW  COMPARISON:  01/30/2015 CT  FINDINGS: Dilated gas-filled small bowel loops are again noted.  A small amount of gas in the colon is present.  There has been little interval change since the prior study.  Degenerative changes in the lumbar spine and hips are again identified.  IMPRESSION: Unchanged distended small bowel loops suspicious for small bowel obstruction.   Electronically Signed   By: Margarette Canada M.D.   On: 01/31/2015 08:15   Ct Angio Chest Aorta W/cm &/or Wo/cm  01/30/2015   CLINICAL DATA:  Low back and epigastric pain. History of back surgery, breast carcinoma, cholecystectomy.  EXAM: CT ANGIOGRAPHY CHEST, ABDOMEN AND PELVIS  TECHNIQUE: Multidetector CT imaging through the chest, abdomen and pelvis was performed using the standard protocol during bolus administration of intravenous contrast. Multiplanar reconstructed images and MIPs were obtained and reviewed to evaluate the vascular anatomy.  CONTRAST:  165mL OMNIPAQUE IOHEXOL 350 MG/ML SOLN  COMPARISON:  11/10/2013  FINDINGS: CTA CHEST  The noncontrast scout shows coronary and aortic calcifications. No hyperdense crescent or mediastinal hematoma. No pleural or pericardial effusion. No pneumothorax.  CTA via left arm injection. The SVC is patent. Satisfactory opacification of pulmonary arteries noted, and there is no evidence of pulmonary emboli. Patent bilateral pulmonary veins. Adequate contrast opacification of the thoracic aorta with no evidence of dissection,  aneurysm, or stenosis. There is patent proximal brachiocephalic arch anatomy without proximal stenosis. The left vertebral artery arises directly from the arch, an anatomic variant.  No hilar or mediastinal adenopathy. Mild pulmonary emphysematous changes. No nodule or focal airspace consolidation. Stable sclerotic lesions in multiple thoracic vertebral bodies and the sternum, as well as a partially lytic T7 lesion as before.  Review of the MIP images confirms the above findings.  CTA ABDOMEN AND PELVIS  Arterial findings:  Aorta: Scattered calcified plaque predominately in the infrarenal segment. Mildly ectatic infrarenal segment up to 2.6 cm transverse diameter. There is some eccentric mural thrombus with focal penetrating atheromatous ulcers posteriorly in the ectatic segment. No stenosis.  Celiac axis:         Patent  Superior mesenteric: Patent, classic distal branch anatomy.  Left renal:          Single with proximal bifurcation, patent  Right renal: Partially calcified ostial plaque over a length of less than 1 cm resulting in at least mild stenosis, patent distally.  Inferior mesenteric: Patent, arising from the ectatic segment  Left iliac: Heavy eccentric plaque through the common iliac, which is ectatic distally up to 14 mm diameter. External iliac is widely patent. Heavy atheromatous calcified plaque in the internal iliac.  Right iliac: Eccentric nonocclusive plaque through the  common iliac and internal iliac. The external iliac is widely patent.  Venous findings: Dedicated venous phase imaging not obtained. Patent bilateral renal veins noted.  Review of the MIP images confirms the above findings.  Nonvascular findings: Unremarkable arterial phase evaluation of liver, spleen, adrenal glands, kidneys, pancreas. Stomach is nondistended. There is mild distention of multiple mid abdominal small bowel loops. Distal small bowel is decompressed. No discrete transition zone is evident. Small bowel anastomotic  staple line in the pelvis. Normal appendix. The colon is nondilated. Scattered sigmoid diverticula without adjacent inflammatory/edematous change. Urinary bladder physiologically distended. No ascites. No free air. Changes of ventral hernia repair with decrease in amount of fluid adjacent to the mesh since previous exam. Multiple scattered sclerotic lesions in the lumbar spine and pelvis are stable. Degenerative disc disease most marked L4-5 and L5-S1.  IMPRESSION: 1. Negative for acute PE or thoracic aortic dissection. 2. Multiple distended proximal and mid abdominal small bowel loops suggesting early/partial obstruction, without discrete transition point or etiology. 3. Multiple sclerotic osseous lesions, and mixed lytic/ sclerotic T7 lesion, stable since 11/10/2013. 4. Ectatic abdominal aorta at risk for aneurysm development. Recommend follow up by [place] in 5 years. This recommendation follows ACR consensus guidelines: White Paper of the ACR Incidental Findings Committee II on Vascular Findings. J Am Coll Radiol 2013; 10:789-794.   Electronically Signed   By: Lucrezia Europe M.D.   On: 01/30/2015 16:36   Ct Cta Abd/pel W/cm &/or W/o Cm  01/30/2015   CLINICAL DATA:  Low back and epigastric pain. History of back surgery, breast carcinoma, cholecystectomy.  EXAM: CT ANGIOGRAPHY CHEST, ABDOMEN AND PELVIS  TECHNIQUE: Multidetector CT imaging through the chest, abdomen and pelvis was performed using the standard protocol during bolus administration of intravenous contrast. Multiplanar reconstructed images and MIPs were obtained and reviewed to evaluate the vascular anatomy.  CONTRAST:  147mL OMNIPAQUE IOHEXOL 350 MG/ML SOLN  COMPARISON:  11/10/2013  FINDINGS: CTA CHEST  The noncontrast scout shows coronary and aortic calcifications. No hyperdense crescent or mediastinal hematoma. No pleural or pericardial effusion. No pneumothorax.  CTA via left arm injection. The SVC is patent. Satisfactory opacification of pulmonary  arteries noted, and there is no evidence of pulmonary emboli. Patent bilateral pulmonary veins. Adequate contrast opacification of the thoracic aorta with no evidence of dissection, aneurysm, or stenosis. There is patent proximal brachiocephalic arch anatomy without proximal stenosis. The left vertebral artery arises directly from the arch, an anatomic variant.  No hilar or mediastinal adenopathy. Mild pulmonary emphysematous changes. No nodule or focal airspace consolidation. Stable sclerotic lesions in multiple thoracic vertebral bodies and the sternum, as well as a partially lytic T7 lesion as before.  Review of the MIP images confirms the above findings.  CTA ABDOMEN AND PELVIS  Arterial findings:  Aorta: Scattered calcified plaque predominately in the infrarenal segment. Mildly ectatic infrarenal segment up to 2.6 cm transverse diameter. There is some eccentric mural thrombus with focal penetrating atheromatous ulcers posteriorly in the ectatic segment. No stenosis.  Celiac axis:         Patent  Superior mesenteric: Patent, classic distal branch anatomy.  Left renal:          Single with proximal bifurcation, patent  Right renal: Partially calcified ostial plaque over a length of less than 1 cm resulting in at least mild stenosis, patent distally.  Inferior mesenteric: Patent, arising from the ectatic segment  Left iliac: Heavy eccentric plaque through the common iliac, which is ectatic distally up to  14 mm diameter. External iliac is widely patent. Heavy atheromatous calcified plaque in the internal iliac.  Right iliac: Eccentric nonocclusive plaque through the common iliac and internal iliac. The external iliac is widely patent.  Venous findings: Dedicated venous phase imaging not obtained. Patent bilateral renal veins noted.  Review of the MIP images confirms the above findings.  Nonvascular findings: Unremarkable arterial phase evaluation of liver, spleen, adrenal glands, kidneys, pancreas. Stomach is  nondistended. There is mild distention of multiple mid abdominal small bowel loops. Distal small bowel is decompressed. No discrete transition zone is evident. Small bowel anastomotic staple line in the pelvis. Normal appendix. The colon is nondilated. Scattered sigmoid diverticula without adjacent inflammatory/edematous change. Urinary bladder physiologically distended. No ascites. No free air. Changes of ventral hernia repair with decrease in amount of fluid adjacent to the mesh since previous exam. Multiple scattered sclerotic lesions in the lumbar spine and pelvis are stable. Degenerative disc disease most marked L4-5 and L5-S1.  IMPRESSION: 1. Negative for acute PE or thoracic aortic dissection. 2. Multiple distended proximal and mid abdominal small bowel loops suggesting early/partial obstruction, without discrete transition point or etiology. 3. Multiple sclerotic osseous lesions, and mixed lytic/ sclerotic T7 lesion, stable since 11/10/2013. 4. Ectatic abdominal aorta at risk for aneurysm development. Recommend follow up by [place] in 5 years. This recommendation follows ACR consensus guidelines: White Paper of the ACR Incidental Findings Committee II on Vascular Findings. J Am Coll Radiol 2013; 10:789-794.   Electronically Signed   By: Lucrezia Europe M.D.   On: 01/30/2015 16:36    Anti-infectives: Anti-infectives    None      Assessment/Plan:  We are asked to see the patient for possible bowel obstruction on original CT scan which was a very soft call in the first place. Because it was a noncontrast study which is of limited value, a contrast study was performed this morning which failed to show any sign of mechanical obstruction and has been personally reviewed by me. The patient is 3 weeks post back surgery and I Am concerned that this is an ileus following back surgery but I see no sign of mechanical small bowel obstruction. Patient wants the NG tube removed. I am in agreement that there is limited  value in this NG tube at this point. With patient's consent I will ask that the NG tube be removed and start her on limited clear liquids.  Florene Glen, MD, FACS  02/01/2015

## 2015-02-01 NOTE — Plan of Care (Signed)
Problem: Discharge Progression Outcomes Goal: Other Discharge Outcomes/Goals Plan of care progress to goal for: Small bowel obstruction - NG tube in place. - Continues IV fluids. - Scheduled for an CT scan this am. - Complained of pain, norco given with improvement.

## 2015-02-01 NOTE — Progress Notes (Signed)
[facility] [place] NAME: Sara David    MR#:  [id number]  DATE OF BIRTH:  24-Jul-1946  SUBJECTIVE:  Patient is upset that she has NG tube. She realizes that if she has a small bowel obstruction NG tube will help. She is going for a CAT scan this morning. She has tolerated her contrast prep. REVIEW OF SYSTEMS:    Review of Systems  Constitutional: Negative for fever, chills and malaise/fatigue.  HENT: Negative for sore throat.   Eyes: Negative for blurred vision.  Respiratory: Negative for cough, hemoptysis, shortness of breath and wheezing.   Cardiovascular: Negative for chest pain, palpitations and leg swelling.  Gastrointestinal: Negative for nausea, vomiting, abdominal pain, diarrhea, constipation and blood in stool.       Had BM yesterday no abd pain or nausea now has NGT  Genitourinary: Negative for dysuria.  Musculoskeletal: Negative for back pain.  Neurological: Negative for dizziness, tremors and headaches.  Endo/Heme/Allergies: Does not bruise/bleed easily.  Psychiatric/Behavioral: Positive for depression. Negative for suicidal ideas.    Tolerating Diet: Nothing by mouth      DRUG ALLERGIES:   Allergies  Allergen Reactions  . Erythromycin Diarrhea and Nausea And Vomiting  . Latex Rash  . Tape Itching and Rash  . Wellbutrin [Bupropion] Rash    VITALS:  Blood pressure 137/57, pulse 82, temperature 97.6 F (36.4 C), temperature source Oral, resp. rate 18, height 5\' 4"  (1.626 m), weight 66.225 kg (146 lb), SpO2 100 %.  PHYSICAL EXAMINATION:   Physical Exam  Constitutional: She is oriented to person, place, and time and well-developed, well-nourished, and in no distress. No distress.  HENT:  Head: Normocephalic.  NGT placed  Eyes: No scleral icterus.  Neck: Normal range of motion. Neck supple. No JVD present. No tracheal deviation present.  Cardiovascular: Normal rate, regular rhythm and normal heart sounds.   Exam reveals no gallop and no friction rub.   No murmur heard. Pulmonary/Chest: Effort normal and breath sounds normal. No respiratory distress. She has no wheezes. She has no rales. She exhibits no tenderness.  Abdominal: She exhibits distension. She exhibits no mass. There is no tenderness. There is no rebound and no guarding.  Hyperactive BS  Musculoskeletal: Normal range of motion. She exhibits no edema.  Neurological: She is alert and oriented to person, place, and time.  Skin: Skin is warm. No rash noted. No erythema.  Psychiatric: Affect and judgment normal.      LABORATORY PANEL:   CBC  Recent Labs Lab 02/01/15 0506  WBC 6.5  HGB 12.3  HCT 37.7  PLT 228   ------------------------------------------------------------------------------------------------------------------  Chemistries   Recent Labs Lab 01/30/15 1321  02/01/15 0506  NA 136  < > 140  K 3.9  < > 4.1  CL 98*  < > 110  CO2 24  < > 18*  GLUCOSE 103*  < > 57*  BUN 26*  < > 19  CREATININE 1.10*  < > 1.01*  CALCIUM 10.1  < > 8.1*  AST 24  --   --   ALT 18  --   --   ALKPHOS 80  --   --   BILITOT 0.6  --   --   < > = values in this interval not displayed. ------------------------------------------------------------------------------------------------------------------  Cardiac Enzymes  Recent Labs Lab 01/30/15 1838 01/30/15 2220 01/31/15 0221  TROPONINI <0.03 <0.03 <0.03   ------------------------------------------------------------------------------------------------------------------  RADIOLOGY:  Ct Abdomen Pelvis Wo Contrast  02/01/2015     IMPRESSION: Mild bilateral posterior basilar subsegmental atelectasis.  Nasogastric tube tip is seen in proximal stomach.  No colonic dilatation is noted.  Mildly dilated small bowel loops are again noted, but may be slightly improved compared to prior exam. It is uncertain if this represents ileus or possibly distal small bowel obstruction. Continued  radiographic follow-up is recommended.   Electronically Signed   By: Marijo Conception, M.D.   On: 02/01/2015 10:17   Dg Abd 1 View  01/31/2015   CLINICAL DATA:  Nasogastric tube placement. Subsequent encounter. Small bowel obstruction.  EXAM: ABDOMEN - 1 VIEW  COMPARISON:  01/31/2015 at 0737 hours.  FINDINGS: Nasogastric tube is present with the tip in the gastric fundus. The proximal side port is at the gastroesophageal junction. The tube could be advanced 2 were 3 cm to prevent gastroesophageal reflux through the side-port. Dilated loops of small bowel are present. No gross plain film evidence of free air.  IMPRESSION: 1. Nasogastric tube tip in the proximal fundus. 2. Side port of the nasogastric tube is at the gastroesophageal junction. The tube could be advanced between 2 cm and 3 cm for better positioning.   Electronically Signed   By: Dereck Ligas M.D.   On: 01/31/2015 15:20   Abd 1 View (kub)  01/31/2015   CLINICAL DATA:  Small bowel obstruction follow-up.  EXAM: ABDOMEN - 1 VIEW  COMPARISON:  01/30/2015 CT  FINDINGS: Dilated gas-filled small bowel loops are again noted.  A small amount of gas in the colon is present.  There has been little interval change since the prior study.  Degenerative changes in the lumbar spine and hips are again identified.  IMPRESSION: Unchanged distended small bowel loops suspicious for small bowel obstruction.   Electronically Signed   By: Margarette Canada M.D.   On: 01/31/2015 08:15   Ct Angio Chest Aorta W/cm &/or Wo/cm  01/30/2015   IMPRESSION: 1. Negative for acute PE or thoracic aortic dissection. 2. Multiple distended proximal and mid abdominal small bowel loops suggesting early/partial obstruction, without discrete transition point or etiology. 3. Multiple sclerotic osseous lesions, and mixed lytic/ sclerotic T7 lesion, stable since 11/10/2013. 4. Ectatic abdominal aorta at risk for aneurysm development. Recommend follow up by [place] in 5 years. This recommendation  follows ACR consensus guidelines: White Paper of the ACR Incidental Findings Committee II on Vascular Findings. J Am Coll Radiol 2013; 10:789-794.   Electronically Signed   By: Lucrezia Europe M.D.   On: 01/30/2015 16:36   Ct Cta Abd/pel W/cm &/or W/o Cm  01/30/2015   C IMPRESSION: 1. Negative for acute PE or thoracic aortic dissection. 2. Multiple distended proximal and mid abdominal small bowel loops suggesting early/partial obstruction, without discrete transition point or etiology. 3. Multiple sclerotic osseous lesions, and mixed lytic/ sclerotic T7 lesion, stable since 11/10/2013. 4. Ectatic abdominal aorta at risk for aneurysm development. Recommend follow up by [place] in 5 years. This recommendation follows ACR consensus guidelines: White Paper of the ACR Incidental Findings Committee II on Vascular Findings. J Am Coll Radiol 2013; 10:789-794.   Electronically Signed   By: Lucrezia Europe M.D.   On: 01/30/2015 16:36     ASSESSMENT AND PLAN:   This is a 69 year old female with a history of metastatic breast carcinoma and small bowel obstruction who presented with abdominal pain and found to have a partial/complete small bowel obstruction.  1. Ileus versus distal small bowel obstruction: CT scan with contrast this  morning shows  ileus or possible distal small bowel obstruction. Continue NG tube. This seems to have some output. Her abdomen is less distended with the NG tube. Appreciate surgery consultation. Further recommendations as per surgery.   2. Stage IV metastatic breast cancer: Patient will continue femora.  3. Recent external otitis infection: Patient will continue on Cipro optic suspension.  4. Hypothyroidism: Patient will continue on Synthroid 100 mg daily. 5. Depression: Patient will continue citalopram  6. Hyperlipidemia: Patient is on Pravachol   7. Tobacco dependence: Patient will continue on nicotine patch.   8. Hypoglycemia: Patient's blood sugar was low this morning. She was given  D50. She will ongoing monitoring of her blood sugar. I may consider changing her fluids to D5 shoe will remain nothing by mouth for a prolonged period     Management plans discussed with the patient and she is in agreement.  CODE STATUS: Full  TOTAL TIME TAKING CARE OF THIS PATIENT: 26 minutes.   Greater than 50% counseling and coordination of care  POSSIBLE D/C 2-3 days , DEPENDING ON CLINICAL CONDITION.   Hudson Majkowski M.D on 02/01/2015 at 10:36 AM  Between 7am to 6pm - Pager - [phone] After 6pm go to [email or web address] - password [email or web address] [facility]  Office  [phone]  CC: Primary care physician; Lorenza Evangelist, MD

## 2015-02-01 NOTE — Plan of Care (Signed)
Problem: Discharge Progression Outcomes Goal: Other Discharge Outcomes/Goals Outcome: Progressing Patient had no c/o pain today VSS NG tube removed and patient advanced to a clear liquid diet  Patient is tolerating new diet without nausea  Patient is up the bathroom by self

## 2015-02-01 NOTE — Plan of Care (Signed)
Problem: Discharge Progression Outcomes Goal: Tolerating diet Outcome: Progressing Advanced to clear liquid diet

## 2015-02-02 MED ORDER — NICOTINE 21 MG/24HR TD PT24: 21.0000 mg | MEDICATED_PATCH | Freq: Every day | TRANSDERMAL | Status: DC

## 2015-02-02 NOTE — Discharge Instructions (Signed)
Small Bowel Obstruction °A small bowel obstruction means something is blocking the small intestine. The small intestine is the long tube that connects the stomach to the colon. Treatment depends on what is causing the problem. Treatment also depends on how bad the problem is. °HOME CARE °Your doctor may let you go home if your small bowel is not completely blocked. °· Rest. °· Follow your diet as told by your doctor. °· Only drink clear liquids until you start to get better. °· Avoid solid foods as told by your doctor. °· Only take medicine as told by your doctor. °GET HELP RIGHT AWAY IF: °· You have pain or cramps that get worse. °· You throw up (vomit) blood. °· You feel sick to your stomach (nauseous), or you cannot stop throwing up. °· You cannot drink fluids. °· You feel confused. °· You feel dry or thirsty (dehydrated). °· Your belly gets more puffy (bloated). °· You have chills. °· You have a fever. °· You feel weak, or you pass out (faint). °MAKE SURE YOU: °· Understand these instructions. °· Will watch your condition. °· Will get help right away if you are not doing well or get worse. °Document Released: 09/12/2004 Document Revised: 10/28/2011 Document Reviewed: 11/13/2010 °ExitCare® Patient Information ©2015 [employer]. This information is not intended to replace advice given to you by your health care provider. Make sure you discuss any questions you have with your health care provider. ° °

## 2015-02-02 NOTE — Progress Notes (Signed)
CC: Abdominal pain Subjective: Patient admitted with abdominal pain and has not had any nausea or vomiting as CT scan yesterday showed  no sign of obstruction. He states that her abdominal pain has completely resolved at this point. She denies any abdominal pain no nausea or vomiting and is passing gas and having bowel movements. She tolerated a milkshake last night and is awaiting a full breakfast this morning. She wants to be discharged.  Objective: Vital signs in last 24 hours: Temp:  [97.6 F (36.4 C)-98.5 F (36.9 C)] 98.5 F (36.9 C) (06/16 0527) Pulse Rate:  [75-83] 75 (06/16 0527) Resp:  [18] 18 (06/16 0527) BP: (128-137)/(47-69) 128/69 mmHg (06/16 0527) SpO2:  [97 %-100 %] 97 % (06/16 0527) Last BM Date: 02/02/15  Intake/Output from previous day: 06/15 0701 - 06/16 0700 In: 480 [P.O.:480] Out: 425 [Emesis/NG output:425] Intake/Output this shift:    Physical exam:  Soft nontender abdomen nondistended nontympanitic. In general she appears quite comfortable but remains angry. No palpable neck nodes.  Calves are nontender.  Lab Results: CBC   Recent Labs  01/31/15 0543 02/01/15 0506  WBC 5.6 6.5  HGB 12.5 12.3  HCT 37.3 37.7  PLT 221 228   BMET  Recent Labs  01/31/15 0543 02/01/15 0506  NA 139 140  K 4.0 4.1  CL 107 110  CO2 25 18*  GLUCOSE 86 57*  BUN 21* 19  CREATININE 1.07* 1.01*  CALCIUM 8.3* 8.1*   PT/INR No results for input(s): LABPROT, INR in the last 72 hours. ABG No results for input(s): PHART, HCO3 in the last 72 hours.  Invalid input(s): PCO2, PO2  Studies/Results: Ct Abdomen Pelvis Wo Contrast  02/01/2015   CLINICAL DATA:  Acute right lower quadrant abdominal pain.  EXAM: CT ABDOMEN AND PELVIS WITHOUT CONTRAST  TECHNIQUE: Multidetector CT imaging of the abdomen and pelvis was performed following the standard protocol without IV contrast.  COMPARISON:  Radiograph of January 31, 2015; CT scan of January 30, 2015.  FINDINGS: Multilevel  degenerative disc disease is noted in the lumbar spine. Stable sclerotic densities are noted in the visualized skeleton. Mild bilateral posterior basilar subsegmental atelectasis is noted.  Status post cholecystectomy. No focal abnormality is noted in the liver, spleen or pancreas on these unenhanced images. Nasogastric tube tip is seen in proximal stomach. Atherosclerosis of abdominal aorta is noted without aneurysm formation. Adrenal glands and kidneys appear normal. No hydronephrosis or renal obstruction is noted. No renal or ureteral calculi are noted. The appendix appears normal. No significant colonic dilatation is noted. Mildly dilated small bowel loops are again noted which may be slightly improved. Is uncertain if this represents ileus or distal small bowel obstruction. No definite transition zone is noted. Postsurgical changes are noted along anterior abdominal wall consistent with prior hernia repair. No significant adenopathy is noted. Small amount of free fluid is noted in the dependent portion of the pelvis.  IMPRESSION: Mild bilateral posterior basilar subsegmental atelectasis.  Nasogastric tube tip is seen in proximal stomach.  No colonic dilatation is noted.  Mildly dilated small bowel loops are again noted, but may be slightly improved compared to prior exam. It is uncertain if this represents ileus or possibly distal small bowel obstruction. Continued radiographic follow-up is recommended.   Electronically Signed   By: Marijo Conception, M.D.   On: 02/01/2015 10:17   Dg Abd 1 View  01/31/2015   CLINICAL DATA:  Nasogastric tube placement. Subsequent encounter. Small bowel obstruction.  EXAM: ABDOMEN -  1 VIEW  COMPARISON:  01/31/2015 at 0737 hours.  FINDINGS: Nasogastric tube is present with the tip in the gastric fundus. The proximal side port is at the gastroesophageal junction. The tube could be advanced 2 were 3 cm to prevent gastroesophageal reflux through the side-port. Dilated loops of small  bowel are present. No gross plain film evidence of free air.  IMPRESSION: 1. Nasogastric tube tip in the proximal fundus. 2. Side port of the nasogastric tube is at the gastroesophageal junction. The tube could be advanced between 2 cm and 3 cm for better positioning.   Electronically Signed   By: Dereck Ligas M.D.   On: 01/31/2015 15:20    Anti-infectives: Anti-infectives    None      Assessment/Plan:  Resolving and completely resolved abdominal pain no sign or evidence of mechanical bowel obstruction no need for surgical intervention at this point. We'll sign off please reconsult if necessary.  Florene Glen, MD, FACS  02/02/2015

## 2015-02-02 NOTE — Discharge Summary (Signed)
[facility] [place] NAME: Sara David    MR#:  [id number]  DATE OF BIRTH:  05/20/1946  DATE OF ADMISSION:  01/30/2015 ADMITTING PHYSICIAN: Theodoro Grist, MD  DATE OF DISCHARGE: 02/02/2015  PRIMARY CARE PHYSICIAN: Lorenza Evangelist, MD    ADMISSION DIAGNOSIS:  SBO (small bowel obstruction) [K56.69] Chest pain [R07.9]  DISCHARGE DIAGNOSIS:  Principal Problem:   Small bowel obstruction Active Problems:   Back pain   B12 deficiency   Gastroesophageal reflux disease   IBS (irritable bowel syndrome)   Asthma   Hypothyroidism   FH: cholecystectomy   Breast cancer   SECONDARY DIAGNOSIS:   Past Medical History  Diagnosis Date  . Hypertension   . Cancer   . Breast cancer     HOSPITAL COURSE:  This is a 69 year old female with a history of metastatic breast cancer and small bowel obstruction who presented with abdominal pain and found to have an ileus. 1. Ileus/partial small bowel dissection: Patient underwent a CT with contrast it did not show evidence of a small bowel ejection. Patient was seen and evaluated by surgery. Patient does have a history of small bowel obstruction and adhesions from previous hernia surgery. She initially did not want an NG tube placed however after day 1 of hospitalization she did agree for NG tube placement. The NG tube did help to decompress the stomach. She is doing well at discharge. She is tolerating her soft diet.  2. Stage IV metastatic breast cancer: Patient will continue femora.  3. Recent external otitis infection: Patient will continue on Cipro optic suspension.  4. Hypothyroidism: Patient will continue on Synthroid 100 mg daily. 5. Depression: Patient will continue citalopram  6. Hyperlipidemia: Patient is on Pravachol   7. Tobacco dependence: Patient will continue on nicotine patch.      DISCHARGE CONDITIONS AND DIET:  Patient is stable for discharge home on a heart healthy  diet  CONSULTS OBTAINED:  Treatment Team:  Florene Glen, MD  DRUG ALLERGIES:   Allergies  Allergen Reactions  . Erythromycin Diarrhea and Nausea And Vomiting  . Latex Rash  . Tape Itching and Rash  . Wellbutrin [Bupropion] Rash    DISCHARGE MEDICATIONS:   Current Discharge Medication List    START taking these medications   Details  nicotine (NICODERM CQ - DOSED IN MG/24 HOURS) 21 mg/24hr patch Place 1 patch (21 mg total) onto the skin daily. Qty: 28 patch, Refills: 0      CONTINUE these medications which have NOT CHANGED   Details  acetaminophen (TYLENOL) 500 MG tablet Take 500-1,000 mg by mouth every 6 (six) hours as needed for mild pain or headache.    cetirizine (ZYRTEC) 10 MG tablet Take 10 mg by mouth daily.    citalopram (CELEXA) 20 MG tablet Take 20 mg by mouth daily.    cyclobenzaprine (FLEXERIL) 10 MG tablet Take 10 mg by mouth every 8 (eight) hours as needed for muscle spasms.    fluticasone (FLONASE) 50 MCG/ACT nasal spray Place 2 sprays into both nostrils daily as needed for rhinitis.    Fluticasone-Salmeterol (ADVAIR) 100-50 MCG/DOSE AEPB Inhale 1 puff into the lungs 2 (two) times daily.    gabapentin (NEURONTIN) 300 MG capsule Take 300 mg by mouth daily.    HYDROcodone-acetaminophen (NORCO/VICODIN) 5-325 MG per tablet Take 1-2 tablets by mouth every 4 (four) hours as needed for moderate pain.    letrozole (FEMARA) 2.5 MG tablet Take 2.5 mg by  mouth daily.    levothyroxine (SYNTHROID, LEVOTHROID) 100 MCG tablet Take 100 mcg by mouth daily.    meloxicam (MOBIC) 7.5 MG tablet Take 7.5 mg by mouth 2 (two) times daily with a meal.    montelukast (SINGULAIR) 10 MG tablet Take 10 mg by mouth daily.    omeprazole (PRILOSEC) 20 MG capsule Take 20 mg by mouth daily.    pravastatin (PRAVACHOL) 40 MG tablet Take 40 mg by mouth daily.    valsartan-hydrochlorothiazide (DIOVAN-HCT) 80-12.5 MG per tablet Take 1 tablet by mouth daily.    zolpidem (AMBIEN)  10 MG tablet Take 10 mg by mouth at bedtime as needed for sleep.              Today   CHIEF COMPLAINT:  Patient is doing well this morning. Patient tolerated her clear liquid diet last night. Patient denies nausea, vomiting or abdominal pain. Patient has had several bowel movements.   VITAL SIGNS:  Blood pressure 128/69, pulse 75, temperature 98.5 F (36.9 C), temperature source Oral, resp. rate 18, height 5\' 4"  (1.626 m), weight 66.225 kg (146 lb), SpO2 97 %.   REVIEW OF SYSTEMS:  Review of Systems  Constitutional: Negative for fever, chills and malaise/fatigue.  HENT: Negative for sore throat.   Eyes: Negative for blurred vision.  Respiratory: Negative for cough, hemoptysis, shortness of breath and wheezing.   Cardiovascular: Negative for chest pain, palpitations and leg swelling.  Gastrointestinal: Negative for nausea, vomiting, abdominal pain, diarrhea and blood in stool.  Genitourinary: Negative for dysuria.  Musculoskeletal: Negative for back pain.  Neurological: Negative for dizziness, tremors and headaches.  Endo/Heme/Allergies: Does not bruise/bleed easily.     PHYSICAL EXAMINATION:  GENERAL:  69 y.o.-year-old patient lying in the bed with no acute distress.  NECK:  Supple, no jugular venous distention. No thyroid enlargement, no tenderness.  LUNGS: Normal breath sounds bilaterally, no wheezing, rales,rhonchi  No use of accessory muscles of respiration.  CARDIOVASCULAR: S1, S2 normal. No murmurs, rubs, or gallops.  ABDOMEN: Soft, non-tender, non-distended. Bowel sounds present. No organomegaly or mass.  EXTREMITIES: No pedal edema, cyanosis, or clubbing.  PSYCHIATRIC: The patient is alert and oriented x 3.  SKIN: No obvious rash, lesion, or ulcer.   DATA REVIEW:   CBC  Recent Labs Lab 02/01/15 0506  WBC 6.5  HGB 12.3  HCT 37.7  PLT 228    Chemistries   Recent Labs Lab 01/30/15 1321  02/01/15 0506  NA 136  < > 140  K 3.9  < > 4.1  CL 98*  <  > 110  CO2 24  < > 18*  GLUCOSE 103*  < > 57*  BUN 26*  < > 19  CREATININE 1.10*  < > 1.01*  CALCIUM 10.1  < > 8.1*  AST 24  --   --   ALT 18  --   --   ALKPHOS 80  --   --   BILITOT 0.6  --   --   < > = values in this interval not displayed.  Cardiac Enzymes  Recent Labs Lab 01/30/15 1838 01/30/15 2220 01/31/15 0221  TROPONINI <0.03 <0.03 <0.03    Microbiology Results  @[email or web address] @  RADIOLOGY:  Ct Abdomen Pelvis Wo Contrast  02/01/2015   CLINICAL DATA:  Acute right lower quadrant abdominal pain.  EXAM: CT ABDOMEN AND PELVIS WITHOUT CONTRAST  TECHNIQUE: Multidetector CT imaging of the abdomen and pelvis was performed following the standard protocol without IV contrast.  COMPARISON:  Radiograph of January 31, 2015; CT scan of January 30, 2015.  FINDINGS: Multilevel degenerative disc disease is noted in the lumbar spine. Stable sclerotic densities are noted in the visualized skeleton. Mild bilateral posterior basilar subsegmental atelectasis is noted.  Status post cholecystectomy. No focal abnormality is noted in the liver, spleen or pancreas on these unenhanced images. Nasogastric tube tip is seen in proximal stomach. Atherosclerosis of abdominal aorta is noted without aneurysm formation. Adrenal glands and kidneys appear normal. No hydronephrosis or renal obstruction is noted. No renal or ureteral calculi are noted. The appendix appears normal. No significant colonic dilatation is noted. Mildly dilated small bowel loops are again noted which may be slightly improved. Is uncertain if this represents ileus or distal small bowel obstruction. No definite transition zone is noted. Postsurgical changes are noted along anterior abdominal wall consistent with prior hernia repair. No significant adenopathy is noted. Small amount of free fluid is noted in the dependent portion of the pelvis.  IMPRESSION: Mild bilateral posterior basilar subsegmental atelectasis.  Nasogastric tube tip is seen in proximal  stomach.  No colonic dilatation is noted.  Mildly dilated small bowel loops are again noted, but may be slightly improved compared to prior exam. It is uncertain if this represents ileus or possibly distal small bowel obstruction. Continued radiographic follow-up is recommended.   Electronically Signed   By: Marijo Conception, M.D.   On: 02/01/2015 10:17   Dg Abd 1 View  01/31/2015   CLINICAL DATA:  Nasogastric tube placement. Subsequent encounter. Small bowel obstruction.  EXAM: ABDOMEN - 1 VIEW  COMPARISON:  01/31/2015 at 0737 hours.  FINDINGS: Nasogastric tube is present with the tip in the gastric fundus. The proximal side port is at the gastroesophageal junction. The tube could be advanced 2 were 3 cm to prevent gastroesophageal reflux through the side-port. Dilated loops of small bowel are present. No gross plain film evidence of free air.  IMPRESSION: 1. Nasogastric tube tip in the proximal fundus. 2. Side port of the nasogastric tube is at the gastroesophageal junction. The tube could be advanced between 2 cm and 3 cm for better positioning.   Electronically Signed   By: Dereck Ligas M.D.   On: 01/31/2015 15:20      Management plans discussed with the patient and she is in agreement. Stable for discharge home  Patient should follow up with ONCOLOGY  CODE STATUS:     Code Status Orders        Start     Ordered   01/30/15 1830  Full code   Continuous     01/30/15 1829      TOTAL TIME TAKING CARE OF THIS PATIENT: 35 minutes.    Quetzalli Clos M.D on 02/02/2015 at 11:19 AM  Between 7am to 6pm - Pager - [phone] After 6pm go to [email or web address] - password [email or web address] [facility]  Office  [phone]  CC: Primary care physician; Lorenza Evangelist, MD

## 2015-02-02 NOTE — Plan of Care (Signed)
Problem: Discharge Progression Outcomes Goal: Other Discharge Outcomes/Goals Plan of care progress to goal for: Small bowel obstruction - Ambulates in room. - No nausea or vomiting noted. - No complaints of pain, will continue to monitor.

## 2015-02-13 DIAGNOSIS — M25539 Pain in unspecified wrist: Secondary | ICD-10-CM | POA: Insufficient documentation

## 2015-03-09 ENCOUNTER — Telehealth: Payer: Self-pay | Admitting: *Deleted

## 2015-03-09 MED ORDER — ZOLPIDEM TARTRATE 10 MG PO TABS: 10.0000 mg | ORAL_TABLET | Freq: Every evening | ORAL | Status: DC | PRN

## 2015-03-09 NOTE — Telephone Encounter (Signed)
faxed

## 2015-05-11 DIAGNOSIS — M1611 Unilateral primary osteoarthritis, right hip: Secondary | ICD-10-CM | POA: Diagnosis present

## 2015-05-13 ENCOUNTER — Other Ambulatory Visit: Payer: Self-pay | Admitting: Oncology

## 2015-05-23 ENCOUNTER — Other Ambulatory Visit: Payer: Self-pay | Admitting: *Deleted

## 2015-05-23 DIAGNOSIS — C50919 Malignant neoplasm of unspecified site of unspecified female breast: Secondary | ICD-10-CM

## 2015-05-24 ENCOUNTER — Inpatient Hospital Stay (HOSPITAL_BASED_OUTPATIENT_CLINIC_OR_DEPARTMENT_OTHER): Payer: Medicare Other | Admitting: Oncology

## 2015-05-24 ENCOUNTER — Inpatient Hospital Stay: Payer: Medicare Other | Attending: Oncology

## 2015-05-24 VITALS — BP 139/82 | HR 67 | Temp 96.6°F | Wt 143.1 lb

## 2015-05-24 DIAGNOSIS — Z170 Estrogen receptor positive status [ER+]: Secondary | ICD-10-CM

## 2015-05-24 DIAGNOSIS — M8580 Other specified disorders of bone density and structure, unspecified site: Secondary | ICD-10-CM

## 2015-05-24 DIAGNOSIS — C784 Secondary malignant neoplasm of small intestine: Secondary | ICD-10-CM | POA: Diagnosis not present

## 2015-05-24 DIAGNOSIS — C50919 Malignant neoplasm of unspecified site of unspecified female breast: Secondary | ICD-10-CM | POA: Diagnosis not present

## 2015-05-24 DIAGNOSIS — I10 Essential (primary) hypertension: Secondary | ICD-10-CM

## 2015-05-24 DIAGNOSIS — F17210 Nicotine dependence, cigarettes, uncomplicated: Secondary | ICD-10-CM

## 2015-05-24 DIAGNOSIS — M25551 Pain in right hip: Secondary | ICD-10-CM | POA: Insufficient documentation

## 2015-05-24 DIAGNOSIS — Z79811 Long term (current) use of aromatase inhibitors: Secondary | ICD-10-CM | POA: Diagnosis not present

## 2015-05-24 DIAGNOSIS — Z79899 Other long term (current) drug therapy: Secondary | ICD-10-CM | POA: Insufficient documentation

## 2015-05-24 DIAGNOSIS — C7951 Secondary malignant neoplasm of bone: Secondary | ICD-10-CM | POA: Insufficient documentation

## 2015-05-24 DIAGNOSIS — C7889 Secondary malignant neoplasm of other digestive organs: Secondary | ICD-10-CM | POA: Diagnosis not present

## 2015-05-24 NOTE — Progress Notes (Signed)
Patient here for follow up visit.  Had back surgery in April 2016 currently receiving steroid shot in right hip for pain, possible hip replacement surgery

## 2015-05-25 LAB — CANCER ANTIGEN 27.29: CA 27.29: 31.3 U/mL (ref 0.0–38.6)

## 2015-06-07 ENCOUNTER — Other Ambulatory Visit: Payer: Self-pay | Admitting: Oncology

## 2015-06-09 NOTE — Progress Notes (Signed)
[facility]  [phone] [phone]) [phone]  ID: [name] OB: [birth year]  MR#: [id number]  OZD#:[id number]  Patient Care Team: Lorenza Evangelist, MD as PCP - General (Endocrinology)  CHIEF COMPLAINT:  Chief Complaint  Patient presents with  . Follow-up    INTERVAL HISTORY: Patient returns to clinic today for further evaluation and routine six-month follow-up. She continues to tolerate letrozole well.  She has some right hip pain and may have to undergo hip replacement in the near future.  She otherwise feels well and is asymptomatic. She has no neurologic complaints.  She denies any chest pain or shortness of breath.  She has a good appetite and denies weight loss. She denies any nausea, vomiting, constipation, or diarrhea.  She has no melena or hematochezia.  She has no urinary complaints.  Patient offers no further specific complaints today.   REVIEW OF SYSTEMS:   Review of Systems  Constitutional: Negative.   Respiratory: Negative.   Cardiovascular: Negative.   Gastrointestinal: Negative.   Musculoskeletal: Positive for joint pain.  Neurological: Negative.     As per HPI. Otherwise, a complete review of systems is negatve.  PAST MEDICAL HISTORY: Past Medical History  Diagnosis Date  . Hypertension   . Cancer   . Breast cancer     PAST SURGICAL HISTORY: Past Surgical History  Procedure Laterality Date  . Back surgery    . Cholecystectomy      FAMILY HISTORY: Reviewed and unchanged. No reported history of breast cancer or other chronic diseases.     ADVANCED DIRECTIVES:    HEALTH MAINTENANCE: Social History  Substance Use Topics  . Smoking status: Current Every Day Smoker -- 0.50 packs/day    Types: Cigarettes  . Smokeless tobacco: Not on file  . Alcohol Use: No     Colonoscopy:  PAP:  Bone density:  Lipid panel:  Allergies  Allergen Reactions  . Erythromycin Diarrhea and Nausea And Vomiting  . Latex Rash  . Tape  Itching and Rash  . Wellbutrin [Bupropion] Rash    Current Outpatient Prescriptions  Medication Sig Dispense Refill  . acetaminophen (TYLENOL) 500 MG tablet Take 500-1,000 mg by mouth every 6 (six) hours as needed for mild pain or headache.    . cetirizine (ZYRTEC) 10 MG tablet Take 10 mg by mouth daily.    . citalopram (CELEXA) 20 MG tablet Take 20 mg by mouth daily.    . fluticasone (FLONASE) 50 MCG/ACT nasal spray Place 2 sprays into both nostrils daily as needed for rhinitis.    . Fluticasone-Salmeterol (ADVAIR) 100-50 MCG/DOSE AEPB Inhale 1 puff into the lungs 2 (two) times daily.    Marland Kitchen letrozole (FEMARA) 2.5 MG tablet TAKE 1 TABLET ONE TIME DAILY 90 tablet 4  . levothyroxine (SYNTHROID, LEVOTHROID) 100 MCG tablet Take 100 mcg by mouth daily.    . meloxicam (MOBIC) 7.5 MG tablet Take 7.5 mg by mouth 2 (two) times daily with a meal.    . montelukast (SINGULAIR) 10 MG tablet Take 10 mg by mouth daily.    Marland Kitchen omeprazole (PRILOSEC) 20 MG capsule Take 20 mg by mouth daily.    . pravastatin (PRAVACHOL) 40 MG tablet Take 40 mg by mouth daily.    . valsartan-hydrochlorothiazide (DIOVAN-HCT) 80-12.5 MG per tablet Take 1 tablet by mouth daily.    Marland Kitchen zolpidem (AMBIEN) 10 MG tablet TAKE ONE TABLET BY MOUTH AT BEDTIME AS NEEDED FOR SLEEP 30 tablet 0   No current facility-administered medications for this  visit.    OBJECTIVE: Filed Vitals:   05/24/15 1049  BP: 139/82  Pulse: 67  Temp: 96.6 F (35.9 C)     Body mass index is 24.55 kg/(m^2).    ECOG FS:0 - Asymptomatic  General: Well-developed, well-nourished, no acute distress. Eyes: Pink conjunctiva, anicteric sclera. Breasts: Patient requested exam be deferred today. Lungs: Clear to auscultation bilaterally. Heart: Regular rate and rhythm. No rubs, murmurs, or gallops. Abdomen: Soft, nontender, nondistended. No organomegaly noted, normoactive bowel sounds. Musculoskeletal: No edema, cyanosis, or clubbing. Neuro: Alert, answering all  questions appropriately. Cranial nerves grossly intact. Skin: No rashes or petechiae noted. Psych: Normal affect.   LAB RESULTS:  Lab Results  Component Value Date   NA 140 02/01/2015   K 4.1 02/01/2015   CL 110 02/01/2015   CO2 18* 02/01/2015   GLUCOSE 57* 02/01/2015   BUN 19 02/01/2015   CREATININE 1.01* 02/01/2015   CALCIUM 8.1* 02/01/2015   PROT 8.2* 01/30/2015   ALBUMIN 4.7 01/30/2015   AST 24 01/30/2015   ALT 18 01/30/2015   ALKPHOS 80 01/30/2015   BILITOT 0.6 01/30/2015   GFRNONAA 56* 02/01/2015   GFRAA >60 02/01/2015    Lab Results  Component Value Date   WBC 6.5 02/01/2015   NEUTROABS 4.8 02/01/2015   HGB 12.3 02/01/2015   HCT 37.7 02/01/2015   MCV 95.4 02/01/2015   PLT 228 02/01/2015     STUDIES: No results found.  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  PLAN:    1.  Breast cancer: CA 27-29 continues to be within normal limits. PET scan results from November 21, 2014 reviewed independently and reported no further evidence of metastatic disease. Pathology from cholecystectomy also reviewed independently.  Although patient's cholecystectomy revealed a metastatic deposit, will continue letrozole, which she will require indefinitely or until significant progression of disease. Zometa has been discontinued.   Return to clinic in 6 months for laboratory work and further evaluation.  Repeat mammogram and bone mineral density in December 2016. 2.  Sleep difficulty: Continue Ambien as needed. 3.  Right hip pain: Treatment per orthopedics.  Patient expressed understanding and was in agreement with this plan. She also understands that She can call clinic at any time with any questions, concerns, or complaints.   No matching staging information was found for the patient.  Lloyd Huger, MD   06/09/2015 2:32 PM

## 2015-06-16 ENCOUNTER — Other Ambulatory Visit: Payer: Self-pay | Admitting: Orthopedic Surgery

## 2015-06-16 DIAGNOSIS — M5441 Lumbago with sciatica, right side: Secondary | ICD-10-CM

## 2015-06-16 DIAGNOSIS — Z9889 Other specified postprocedural states: Secondary | ICD-10-CM

## 2015-06-29 ENCOUNTER — Ambulatory Visit
Admission: RE | Admit: 2015-06-29 | Discharge: 2015-06-29 | Disposition: A | Discharge status: Home / Self Care | Payer: Medicare Other | Source: Ambulatory Visit | Attending: Orthopedic Surgery | Admitting: Orthopedic Surgery

## 2015-06-29 DIAGNOSIS — M79604 Pain in right leg: Secondary | ICD-10-CM | POA: Diagnosis present

## 2015-06-29 DIAGNOSIS — Z9889 Other specified postprocedural states: Secondary | ICD-10-CM | POA: Insufficient documentation

## 2015-06-29 DIAGNOSIS — M4806 Spinal stenosis, lumbar region: Secondary | ICD-10-CM | POA: Insufficient documentation

## 2015-06-29 DIAGNOSIS — M5384 Other specified dorsopathies, thoracic region: Secondary | ICD-10-CM | POA: Diagnosis not present

## 2015-06-29 DIAGNOSIS — M5441 Lumbago with sciatica, right side: Secondary | ICD-10-CM

## 2015-06-29 DIAGNOSIS — M545 Low back pain: Secondary | ICD-10-CM | POA: Diagnosis present

## 2015-06-29 MED ORDER — GADOBENATE DIMEGLUMINE 529 MG/ML IV SOLN
15.0000 mL | Freq: Once | INTRAVENOUS | Status: AC | PRN
  Administered 2015-06-29: 6 mL via INTRAVENOUS

## 2015-07-05 ENCOUNTER — Other Ambulatory Visit: Payer: Self-pay | Admitting: Oncology

## 2015-07-07 ENCOUNTER — Inpatient Hospital Stay: Payer: Medicare Other | Attending: Oncology | Admitting: Oncology

## 2015-07-07 VITALS — BP 112/55 | HR 69 | Temp 98.0°F | Wt 144.4 lb

## 2015-07-07 DIAGNOSIS — C50919 Malignant neoplasm of unspecified site of unspecified female breast: Secondary | ICD-10-CM | POA: Diagnosis present

## 2015-07-07 DIAGNOSIS — C7951 Secondary malignant neoplasm of bone: Secondary | ICD-10-CM | POA: Insufficient documentation

## 2015-07-07 DIAGNOSIS — Z79899 Other long term (current) drug therapy: Secondary | ICD-10-CM

## 2015-07-07 DIAGNOSIS — Z170 Estrogen receptor positive status [ER+]: Secondary | ICD-10-CM

## 2015-07-07 DIAGNOSIS — M5136 Other intervertebral disc degeneration, lumbar region: Secondary | ICD-10-CM | POA: Diagnosis not present

## 2015-07-07 DIAGNOSIS — F17210 Nicotine dependence, cigarettes, uncomplicated: Secondary | ICD-10-CM | POA: Diagnosis not present

## 2015-07-07 DIAGNOSIS — M549 Dorsalgia, unspecified: Secondary | ICD-10-CM | POA: Insufficient documentation

## 2015-07-07 DIAGNOSIS — Z79811 Long term (current) use of aromatase inhibitors: Secondary | ICD-10-CM | POA: Insufficient documentation

## 2015-07-07 DIAGNOSIS — M5126 Other intervertebral disc displacement, lumbar region: Secondary | ICD-10-CM | POA: Insufficient documentation

## 2015-07-07 DIAGNOSIS — C784 Secondary malignant neoplasm of small intestine: Secondary | ICD-10-CM | POA: Insufficient documentation

## 2015-07-07 DIAGNOSIS — C7889 Secondary malignant neoplasm of other digestive organs: Secondary | ICD-10-CM | POA: Insufficient documentation

## 2015-07-07 DIAGNOSIS — G4700 Insomnia, unspecified: Secondary | ICD-10-CM

## 2015-07-07 DIAGNOSIS — I10 Essential (primary) hypertension: Secondary | ICD-10-CM

## 2015-07-07 NOTE — Progress Notes (Signed)
Patient has been evaluated by Sara David who ordered an MRI for back pain.  The MRI results came back abnormal and ortho would like her to see Dr. Grayland Ormond earlier than planned due to possible malignancy.

## 2015-07-10 NOTE — Progress Notes (Signed)
[place]  Telephone:(336) [phone] Fax:(336) [phone]  ID: Sara David OB: Aug 16, 1946  MR#: [id number]  [id number]  [name], [name]  CHIEF COMPLAINT:  Chief Complaint  Patient presents with  . Breast Cancer    INTERVAL HISTORY: Patient returns to clinic today as an add-on to discuss her MRI results. Patient recently has been complaining of back pain and imaging was ordered by orthopedics.  She also has had a recent lumbar fusion. She otherwise feels well and is asymptomatic. She has no neurologic complaints.  She denies any chest pain or shortness of breath.  She has a good appetite and denies weight loss. She denies any nausea, vomiting, constipation, or diarrhea.  She has no melena or hematochezia.  She has no urinary complaints.  Patient offers no further specific complaints today.   REVIEW OF SYSTEMS:   Review of Systems  Constitutional: Negative.   Respiratory: Negative.   Cardiovascular: Negative.   Gastrointestinal: Negative.   Musculoskeletal: Positive for back pain and joint pain.  Neurological: Negative.  Negative for sensory change.    As per HPI. Otherwise, a complete review of systems is negatve.  PAST MEDICAL HISTORY: Past Medical History  Diagnosis Date  . Hypertension   . Cancer   . Breast cancer     PAST SURGICAL HISTORY: Past Surgical History  Procedure Laterality Date  . Back surgery    . Cholecystectomy      FAMILY HISTORY: Reviewed and unchanged. No reported history of breast cancer or other chronic diseases.     ADVANCED DIRECTIVES:    HEALTH MAINTENANCE: Social History  Substance Use Topics  . Smoking status: Current Every Day Smoker -- 0.50 packs/day    Types: Cigarettes  . Smokeless tobacco: Not on file  . Alcohol Use: No     Colonoscopy:  PAP:  Bone density:  Lipid panel:  Allergies  Allergen Reactions  . Diclofenac Sodium Nausea Only   . Erythromycin Diarrhea and Nausea And Vomiting  . Latex Rash  . Tape Itching and Rash  . Wellbutrin [Bupropion] Rash    Current Outpatient Prescriptions  Medication Sig Dispense Refill  . acetaminophen (TYLENOL) 500 MG tablet Take 500-1,000 mg by mouth every 6 (six) hours as needed for mild pain or headache.    . cetirizine (ZYRTEC) 10 MG tablet Take 10 mg by mouth daily.    . citalopram (CELEXA) 20 MG tablet Take 20 mg by mouth daily.    . diclofenac (VOLTAREN) 75 MG EC tablet TAKE 1 TABLET (75 MG TOTAL) BY MOUTH 2 (TWO) TIMES DAILY WITH MEALS. AFTER FOOD.  0  . fluticasone (FLONASE) 50 MCG/ACT nasal spray Place 2 sprays into both nostrils daily as needed for rhinitis.    . Fluticasone-Salmeterol (ADVAIR) 100-50 MCG/DOSE AEPB Inhale 1 puff into the lungs 2 (two) times daily.    Marland Kitchen letrozole (FEMARA) 2.5 MG tablet TAKE 1 TABLET ONE TIME DAILY 90 tablet 4  . levothyroxine (SYNTHROID, LEVOTHROID) 100 MCG tablet Take 100 mcg by mouth daily.    . meloxicam (MOBIC) 7.5 MG tablet Take 7.5 mg by mouth 2 (two) times daily with a meal.    . montelukast (SINGULAIR) 10 MG tablet Take 10 mg by mouth daily.    Marland Kitchen omeprazole (PRILOSEC) 20 MG capsule Take 20 mg by mouth daily.    . pravastatin (PRAVACHOL) 40 MG tablet Take 40 mg by mouth daily.    . valsartan-hydrochlorothiazide (DIOVAN-HCT) 80-12.5  MG per tablet Take 1 tablet by mouth daily.    Marland Kitchen zolpidem (AMBIEN) 10 MG tablet TAKE ONE TABLET BY MOUTH AT BEDTIME AS NEEDED FOR SLEEP 30 tablet 0   No current facility-administered medications for this visit.    OBJECTIVE: Filed Vitals:   07/07/15 1143  BP: 112/55  Pulse: 69  Temp: 98 F (36.7 C)     Body mass index is 24.77 kg/(m^2).    ECOG FS:0 - Asymptomatic  General: Well-developed, well-nourished, no acute distress. Eyes: Pink conjunctiva, anicteric sclera. Breasts: Patient requested exam be deferred today. Lungs: Clear to auscultation bilaterally. Heart: Regular rate and rhythm. No rubs,  murmurs, or gallops. Abdomen: Soft, nontender, nondistended. No organomegaly noted, normoactive bowel sounds. Musculoskeletal: No edema, cyanosis, or clubbing.  Well-healing surgical scar on lumbar spine without erythema. Neuro: Alert, answering all questions appropriately. Cranial nerves grossly intact. Skin: No rashes or petechiae noted. Psych: Normal affect.   LAB RESULTS:  Lab Results  Component Value Date   NA 140 02/01/2015   K 4.1 02/01/2015   CL 110 02/01/2015   CO2 18* 02/01/2015   GLUCOSE 57* 02/01/2015   BUN 19 02/01/2015   CREATININE 1.01* 02/01/2015   CALCIUM 8.1* 02/01/2015   PROT 8.2* 01/30/2015   ALBUMIN 4.7 01/30/2015   AST 24 01/30/2015   ALT 18 01/30/2015   ALKPHOS 80 01/30/2015   BILITOT 0.6 01/30/2015   GFRNONAA 56* 02/01/2015   GFRAA >60 02/01/2015    Lab Results  Component Value Date   WBC 6.5 02/01/2015   NEUTROABS 4.8 02/01/2015   HGB 12.3 02/01/2015   HCT 37.7 02/01/2015   MCV 95.4 02/01/2015   PLT 228 02/01/2015     STUDIES: Mr Lumbar Spine W Wo Contrast  06/29/2015  CLINICAL DATA:  History of lumbar spine surgery in April, 2016. Low back pain radiating into the right leg to the knee. History breast cancer. EXAM: MRI LUMBAR SPINE WITHOUT AND WITH CONTRAST TECHNIQUE: Multiplanar and multiecho pulse sequences of the lumbar spine were obtained without and with intravenous contrast. CONTRAST:  6 mL MULTIHANCE GADOBENATE DIMEGLUMINE 529 MG/ML IV SOLN COMPARISON:  MRI lumbar spine 12/09/2014. PET CT scan 11/21/2014. CT abdomen and pelvis 02/01/2015. FINDINGS: Vertebral body height is maintained. Alignment is unremarkable. There is increased T2 signal in the anterior margin of the L1 vertebral body with postcontrast enhancement. The extent of increased T2 signal is increased since the prior study and previously seen increased T1 signal in this location is no longer appreciated. The findings worrisome for metastatic deposit. Also seen is a small focus of  edema and postcontrast enhancement in the posterior, superior endplate of 624THL which is more conspicuous than on the prior examination. This could be degenerative in nature but is worrisome for metastatic disease. Also seen is increased signal with postcontrast enhancement anterior margin of S1. Degenerative endplate signal change is noted at L4-5. The conus medullaris is normal in signal and position. Imaged intra-abdominal contents are unremarkable. The T10-11 and T11-12 levels are imaged in the sagittal plane only. There is an unchanged central protrusion at T11-12 without central canal stenosis. T10-11 is unremarkable. T12-L1:  Negative. L1-2:  Negative. L2-3: Shallow disc bulge with a superimposed small left paracentral protrusion, ligamentum flavum thickening and facet arthropathy are identified. Mild to moderate central canal narrowing is seen. The foramina are open. L3-4: Status post left laminotomy for discectomy. The central canal is widely patent. Left foraminal narrowing is also improved. The right foramen remains open. L4-5: Shallow  disc bulge, ligamentum flavum thickening and facet degenerative disease are again seen. Shallow right paracentral and lateral recess disc protrusion with caudal extension is unchanged. Moderate central canal narrowing and mild narrowing in the right lateral recess are identified. Moderately severe right foraminal narrowing is again seen. The left foramen is open. L5-S1: Shallow broad-based disc bulge without central canal or foraminal stenosis. IMPRESSION: Increase in size and conspicuity of marrow signal change in the superior endplate of 624THL, L1 vertebral body and S1 segment worrisome for metastatic disease. Marked improvement in left lateral recess and foraminal narrowing at L3-4 after surgery. There has been no progression of degenerative disease since the prior exam. Electronically Signed   By: Inge Rise M.D.   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  PLAN:    1.  Breast cancer: CA 27-29 continues to be within normal limits. PET scan results from November 21, 2014 reviewed independently and reported no further evidence of metastatic disease. Pathology from cholecystectomy also reviewed independently.  Although patient's cholecystectomy revealed a metastatic deposit, will continue letrozole, which she will require indefinitely or until significant progression of disease. Zometa has been discontinued.  Repeat mammogram and bone mineral density in December 2016. 2.  Sleep difficulty: Continue Ambien as needed. 3.  Back pain: MRI results reviewed independently and reported as above with possible concerns for recurrence. Will get bone scan to further evaluate and a referral to radiation oncology if positive for consideration of XRT. Continue letrozole as above.   Approximately 30 minutes was spent in discussion of which greater than 50% was consultation.   Patient expressed understanding and was in agreement with this plan. She also understands that She can call clinic at any time with any questions, concerns, or complaints.   Lloyd Huger, MD   07/10/2015 8:14 AM

## 2015-07-18 ENCOUNTER — Encounter
Admission: RE | Admit: 2015-07-18 | Discharge: 2015-07-18 | Disposition: A | Discharge status: Home / Self Care | Payer: Medicare Other | Source: Ambulatory Visit | Attending: Oncology | Admitting: Oncology

## 2015-07-18 DIAGNOSIS — M1990 Unspecified osteoarthritis, unspecified site: Secondary | ICD-10-CM | POA: Diagnosis not present

## 2015-07-18 DIAGNOSIS — C50919 Malignant neoplasm of unspecified site of unspecified female breast: Secondary | ICD-10-CM | POA: Insufficient documentation

## 2015-07-18 DIAGNOSIS — M47892 Other spondylosis, cervical region: Secondary | ICD-10-CM | POA: Diagnosis not present

## 2015-07-18 DIAGNOSIS — M47894 Other spondylosis, thoracic region: Secondary | ICD-10-CM | POA: Diagnosis not present

## 2015-07-18 MED ORDER — TECHNETIUM TC 99M MEDRONATE IV KIT
23.2300 | PACK | Freq: Once | INTRAVENOUS | Status: AC | PRN
  Administered 2015-07-18: 23.23 via INTRAVENOUS

## 2015-07-19 ENCOUNTER — Encounter: Payer: Self-pay | Admitting: Radiation Oncology

## 2015-07-19 ENCOUNTER — Ambulatory Visit
Admission: RE | Admit: 2015-07-19 | Discharge: 2015-07-19 | Disposition: A | Discharge status: Home / Self Care | Payer: Medicare Other | Source: Ambulatory Visit | Attending: Radiation Oncology | Admitting: Radiation Oncology

## 2015-07-19 VITALS — BP 136/79 | HR 82 | Temp 97.9°F | Resp 18 | Wt 145.1 lb

## 2015-07-19 DIAGNOSIS — C7951 Secondary malignant neoplasm of bone: Secondary | ICD-10-CM

## 2015-07-19 DIAGNOSIS — Z510 Encounter for antineoplastic radiation therapy: Secondary | ICD-10-CM | POA: Insufficient documentation

## 2015-07-19 DIAGNOSIS — Z853 Personal history of malignant neoplasm of breast: Secondary | ICD-10-CM | POA: Insufficient documentation

## 2015-07-19 NOTE — Consult Note (Signed)
Except an outstanding is perfect of Radiation Oncology NEW PATIENT EVALUATION  Name: Sara David  MRN: [id number]  Date:   07/19/2015     DOB: 1946-06-15   This 69 y.o. female patient presents to the clinic for initial evaluation of stage IV breast cancer now with progressive disease in right hip and L-spine.  REFERRING PHYSICIAN: Marinda Elk, MD  CHIEF COMPLAINT:  Chief Complaint  Patient presents with  . Cancer    Pt is here for initial consultation for bone metastasis    DIAGNOSIS: The encounter diagnosis was Bone metastasis ([place]).   PREVIOUS INVESTIGATIONS:  MRI scans and bone scan reviewed Pathology reports reviewed Clinical notes reviewed  HPI: Patient is a 69 year old female diagnosed in August 2012 with invasive lobular carcinoma who is gone on to develop stage IV disease with bone as well as gallbladder involvement. She's been having increasing lower back pain and pain radiating down to her right knee. MRI scan was performed showing increased marrow signal at T11 L1 and S1 worrisome for metastatic disease. This was also confirmed on bone scan. She also has a large area of increased uptake on bone scan in her right hip region. These are all compatible with progressive metastatic disease. She had a cholecystectomy back in April 2016 showing metastatic lobular carcinoma involving tissue surrounding the cystic duct. She's having a little difficulty ambulating especially with the pain in her right hip.  PLANNED TREATMENT REGIMEN: Palliative radiation therapy to lumbar spine as well as right hip  PAST MEDICAL HISTORY:  has a past medical history of Hypertension; Cancer [facility]); and Breast cancer ([place]).    PAST SURGICAL HISTORY:  Past Surgical History  Procedure Laterality Date  . Back surgery    . Cholecystectomy      FAMILY HISTORY: family history is not on file.  SOCIAL HISTORY:  reports that she has been smoking Cigarettes.  She has been smoking about 0.50 packs  per day. She does not have any smokeless tobacco history on file. She reports that she does not drink alcohol.  ALLERGIES: Diclofenac sodium; Erythromycin; Latex; Tape; and Wellbutrin  MEDICATIONS:  Current Outpatient Prescriptions  Medication Sig Dispense Refill  . acetaminophen (TYLENOL) 500 MG tablet Take 500-1,000 mg by mouth every 6 (six) hours as needed for mild pain or headache.    . citalopram (CELEXA) 20 MG tablet Take 20 mg by mouth daily.    . diclofenac (VOLTAREN) 75 MG EC tablet TAKE 1 TABLET (75 MG TOTAL) BY MOUTH 2 (TWO) TIMES DAILY WITH MEALS. AFTER FOOD.  0  . fluticasone (FLONASE) 50 MCG/ACT nasal spray Place 2 sprays into both nostrils daily as needed for rhinitis.    . Fluticasone-Salmeterol (ADVAIR) 100-50 MCG/DOSE AEPB Inhale 1 puff into the lungs 2 (two) times daily.    Marland Kitchen letrozole (FEMARA) 2.5 MG tablet TAKE 1 TABLET ONE TIME DAILY 90 tablet 4  . levothyroxine (SYNTHROID, LEVOTHROID) 100 MCG tablet Take 100 mcg by mouth daily.    . meloxicam (MOBIC) 7.5 MG tablet Take 7.5 mg by mouth 2 (two) times daily with a meal.    . montelukast (SINGULAIR) 10 MG tablet Take 10 mg by mouth daily.    Marland Kitchen omeprazole (PRILOSEC) 20 MG capsule Take 20 mg by mouth daily.    . pravastatin (PRAVACHOL) 40 MG tablet Take 40 mg by mouth daily.    . valsartan-hydrochlorothiazide (DIOVAN-HCT) 80-12.5 MG per tablet Take 1 tablet by mouth daily.    Marland Kitchen zolpidem (AMBIEN) 10 MG tablet  TAKE ONE TABLET BY MOUTH AT BEDTIME AS NEEDED FOR SLEEP 30 tablet 0  . cetirizine (ZYRTEC) 10 MG tablet Take 10 mg by mouth daily.     No current facility-administered medications for this encounter.    ECOG PERFORMANCE STATUS:  1 - Symptomatic but completely ambulatory  REVIEW OF SYSTEMS: Except for the pain as described above  Patient denies any weight loss, fatigue, weakness, fever, chills or night sweats. Patient denies any loss of vision, blurred vision. Patient denies any ringing  of the ears or hearing loss. No  irregular heartbeat. Patient denies heart murmur or history of fainting. Patient denies any chest pain or pain radiating to her upper extremities. Patient denies any shortness of breath, difficulty breathing at night, cough or hemoptysis. Patient denies any swelling in the lower legs. Patient denies any nausea vomiting, vomiting of blood, or coffee ground material in the vomitus. Patient denies any stomach pain. Patient states has had normal bowel movements no significant constipation or diarrhea. Patient denies any dysuria, hematuria or significant nocturia. Patient denies any problems walking, swelling in the joints or loss of balance. Patient denies any skin changes, loss of hair or loss of weight. Patient denies any excessive worrying or anxiety or significant depression. Patient denies any problems with insomnia. Patient denies excessive thirst, polyuria, polydipsia. Patient denies any swollen glands, patient denies easy bruising or easy bleeding. Patient denies any recent infections, allergies or URI. Patient "s visual fields have not changed significantly in recent time.   PHYSICAL EXAM: BP 136/79 mmHg  Pulse 82  Temp(Src) 97.9 F (36.6 C)  Resp 18  Wt 145 lb 1 oz (65.8 kg) Well-developed thin female in NAD. Deep palpation of her spine does not elicit pain motor sensory and DTR levels are equal and symmetric in the upper lower extremities range of motion of her lower extremities does not elicit pain. Well-developed well-nourished patient in NAD. HEENT reveals PERLA, EOMI, discs not visualized.  Oral cavity is clear. No oral mucosal lesions are identified. Neck is clear without evidence of cervical or supraclavicular adenopathy. Lungs are clear to A&P. Cardiac examination is essentially unremarkable with regular rate and rhythm without murmur rub or thrill. Abdomen is benign with no organomegaly or masses noted. Motor sensory and DTR levels are equal and symmetric in the upper and lower extremities.  Cranial nerves II through XII are grossly intact. Proprioception is intact. No peripheral adenopathy or edema is identified. No motor or sensory levels are noted. Crude visual fields are within normal range.  LABORATORY DATA: Prior pathology reports are reviewed    RADIOLOGY RESULTS: MRI scans as well as bone scan are reviewed compatible with the above-stated findings   IMPRESSION: Progressive metastatic disease in her L-spine and right hip  PLAN: At this time I have offered palliative radiation therapy to her lumbar spine down including the sacrum. Would plan on delivering 3000 cGy in 10 fractions. Also would like to treat her right hip to 3000 cGy in 10 fractions again based on the bone scan report as well as her symptoms. Risks and benefits of treatment including possible diarrhea, fatigue, alteration of blood counts, skin reaction all were described in detail to the patient and her husband. Both seem to comprehend my treatment plan well. I have set up and ordered CT simulation on the patient for early next week. Will discuss the case personally with medical oncology.  I would like to take this opportunity for allowing me to participate in the care of  your patient.Armstead Peaks., MD

## 2015-07-24 ENCOUNTER — Ambulatory Visit
Admission: RE | Admit: 2015-07-24 | Discharge: 2015-07-24 | Disposition: A | Discharge status: Home / Self Care | Payer: Medicare Other | Source: Ambulatory Visit | Attending: Radiation Oncology | Admitting: Radiation Oncology

## 2015-07-24 DIAGNOSIS — C7951 Secondary malignant neoplasm of bone: Secondary | ICD-10-CM | POA: Diagnosis not present

## 2015-07-24 DIAGNOSIS — Z853 Personal history of malignant neoplasm of breast: Secondary | ICD-10-CM | POA: Diagnosis not present

## 2015-07-24 DIAGNOSIS — Z510 Encounter for antineoplastic radiation therapy: Secondary | ICD-10-CM | POA: Diagnosis present

## 2015-07-25 DIAGNOSIS — Z510 Encounter for antineoplastic radiation therapy: Secondary | ICD-10-CM | POA: Diagnosis not present

## 2015-07-26 ENCOUNTER — Ambulatory Visit: Admission: RE | Admit: 2015-07-26 | Payer: Medicare Other | Source: Ambulatory Visit

## 2015-07-26 ENCOUNTER — Ambulatory Visit
Admission: RE | Admit: 2015-07-26 | Discharge: 2015-07-26 | Disposition: A | Discharge status: Home / Self Care | Payer: Medicare Other | Source: Ambulatory Visit | Attending: Radiation Oncology | Admitting: Radiation Oncology

## 2015-07-26 DIAGNOSIS — Z510 Encounter for antineoplastic radiation therapy: Secondary | ICD-10-CM | POA: Diagnosis not present

## 2015-07-27 ENCOUNTER — Ambulatory Visit
Admission: RE | Admit: 2015-07-27 | Discharge: 2015-07-27 | Disposition: A | Discharge status: Home / Self Care | Payer: Medicare Other | Source: Ambulatory Visit | Attending: Radiation Oncology | Admitting: Radiation Oncology

## 2015-07-27 DIAGNOSIS — Z510 Encounter for antineoplastic radiation therapy: Secondary | ICD-10-CM | POA: Diagnosis not present

## 2015-07-28 ENCOUNTER — Ambulatory Visit
Admission: RE | Admit: 2015-07-28 | Discharge: 2015-07-28 | Disposition: A | Discharge status: Home / Self Care | Payer: Medicare Other | Source: Ambulatory Visit | Attending: Radiation Oncology | Admitting: Radiation Oncology

## 2015-07-28 ENCOUNTER — Ambulatory Visit: Payer: Medicare Other

## 2015-07-28 DIAGNOSIS — Z510 Encounter for antineoplastic radiation therapy: Secondary | ICD-10-CM | POA: Diagnosis not present

## 2015-07-31 ENCOUNTER — Ambulatory Visit: Payer: Medicare Other

## 2015-07-31 ENCOUNTER — Ambulatory Visit
Admission: RE | Admit: 2015-07-31 | Discharge: 2015-07-31 | Disposition: A | Discharge status: Home / Self Care | Payer: Medicare Other | Source: Ambulatory Visit | Attending: Radiation Oncology | Admitting: Radiation Oncology

## 2015-07-31 DIAGNOSIS — Z510 Encounter for antineoplastic radiation therapy: Secondary | ICD-10-CM | POA: Diagnosis not present

## 2015-08-01 ENCOUNTER — Ambulatory Visit
Admission: RE | Admit: 2015-08-01 | Discharge: 2015-08-01 | Disposition: A | Discharge status: Home / Self Care | Payer: Medicare Other | Source: Ambulatory Visit | Attending: Radiation Oncology | Admitting: Radiation Oncology

## 2015-08-01 ENCOUNTER — Ambulatory Visit: Payer: Medicare Other

## 2015-08-01 DIAGNOSIS — Z510 Encounter for antineoplastic radiation therapy: Secondary | ICD-10-CM | POA: Diagnosis not present

## 2015-08-02 ENCOUNTER — Ambulatory Visit: Payer: Medicare Other

## 2015-08-02 ENCOUNTER — Ambulatory Visit
Admission: RE | Admit: 2015-08-02 | Discharge: 2015-08-02 | Disposition: A | Discharge status: Home / Self Care | Payer: Medicare Other | Source: Ambulatory Visit | Attending: Radiation Oncology | Admitting: Radiation Oncology

## 2015-08-02 ENCOUNTER — Other Ambulatory Visit: Payer: Self-pay | Admitting: Oncology

## 2015-08-02 DIAGNOSIS — Z510 Encounter for antineoplastic radiation therapy: Secondary | ICD-10-CM | POA: Diagnosis not present

## 2015-08-03 ENCOUNTER — Ambulatory Visit: Payer: Medicare Other

## 2015-08-03 ENCOUNTER — Ambulatory Visit
Admission: RE | Admit: 2015-08-03 | Discharge: 2015-08-03 | Disposition: A | Discharge status: Home / Self Care | Payer: Medicare Other | Source: Ambulatory Visit | Attending: Radiation Oncology | Admitting: Radiation Oncology

## 2015-08-03 DIAGNOSIS — Z510 Encounter for antineoplastic radiation therapy: Secondary | ICD-10-CM | POA: Diagnosis not present

## 2015-08-04 ENCOUNTER — Other Ambulatory Visit: Payer: Self-pay | Admitting: Oncology

## 2015-08-04 ENCOUNTER — Ambulatory Visit
Admission: RE | Admit: 2015-08-04 | Discharge: 2015-08-04 | Disposition: A | Discharge status: Home / Self Care | Payer: Medicare Other | Source: Ambulatory Visit | Attending: Radiation Oncology | Admitting: Radiation Oncology

## 2015-08-04 ENCOUNTER — Other Ambulatory Visit: Payer: Self-pay | Admitting: *Deleted

## 2015-08-04 ENCOUNTER — Ambulatory Visit: Payer: Medicare Other

## 2015-08-04 DIAGNOSIS — Z510 Encounter for antineoplastic radiation therapy: Secondary | ICD-10-CM | POA: Diagnosis not present

## 2015-08-04 MED ORDER — PROMETHAZINE HCL 25 MG PO TABS: 25.0000 mg | ORAL_TABLET | Freq: Four times a day (QID) | ORAL | Status: DC | PRN

## 2015-08-07 ENCOUNTER — Ambulatory Visit
Admission: RE | Admit: 2015-08-07 | Discharge: 2015-08-07 | Disposition: A | Discharge status: Home / Self Care | Payer: Medicare Other | Source: Ambulatory Visit | Attending: Radiation Oncology | Admitting: Radiation Oncology

## 2015-08-07 ENCOUNTER — Ambulatory Visit: Payer: Medicare Other

## 2015-08-07 DIAGNOSIS — Z510 Encounter for antineoplastic radiation therapy: Secondary | ICD-10-CM | POA: Diagnosis not present

## 2015-08-08 ENCOUNTER — Ambulatory Visit
Admission: RE | Admit: 2015-08-08 | Discharge: 2015-08-08 | Disposition: A | Discharge status: Home / Self Care | Payer: Medicare Other | Source: Ambulatory Visit | Attending: Radiation Oncology | Admitting: Radiation Oncology

## 2015-08-08 ENCOUNTER — Ambulatory Visit: Payer: Medicare Other

## 2015-08-08 DIAGNOSIS — Z510 Encounter for antineoplastic radiation therapy: Secondary | ICD-10-CM | POA: Diagnosis not present

## 2015-08-09 ENCOUNTER — Ambulatory Visit: Payer: Medicare Other

## 2015-08-09 ENCOUNTER — Ambulatory Visit
Admission: RE | Admit: 2015-08-09 | Discharge: 2015-08-09 | Disposition: A | Discharge status: Home / Self Care | Payer: Medicare Other | Source: Ambulatory Visit | Attending: Radiation Oncology | Admitting: Radiation Oncology

## 2015-08-09 DIAGNOSIS — Z510 Encounter for antineoplastic radiation therapy: Secondary | ICD-10-CM | POA: Diagnosis not present

## 2015-08-10 ENCOUNTER — Ambulatory Visit
Admission: RE | Admit: 2015-08-10 | Discharge: 2015-08-10 | Disposition: A | Discharge status: Home / Self Care | Payer: Medicare Other | Source: Ambulatory Visit | Attending: Radiation Oncology | Admitting: Radiation Oncology

## 2015-08-10 ENCOUNTER — Other Ambulatory Visit: Payer: Self-pay | Admitting: *Deleted

## 2015-08-10 DIAGNOSIS — Z510 Encounter for antineoplastic radiation therapy: Secondary | ICD-10-CM | POA: Diagnosis not present

## 2015-08-10 MED ORDER — HYDROCODONE-ACETAMINOPHEN 7.5-325 MG PO TABS
1.0000 | ORAL_TABLET | Freq: Four times a day (QID) | ORAL | Status: DC | PRN
Start: 2015-08-10 — End: 2015-09-05

## 2015-08-11 ENCOUNTER — Ambulatory Visit
Admission: RE | Admit: 2015-08-11 | Discharge: 2015-08-11 | Disposition: A | Discharge status: Home / Self Care | Payer: Medicare Other | Source: Ambulatory Visit | Attending: Radiation Oncology | Admitting: Radiation Oncology

## 2015-08-11 DIAGNOSIS — Z510 Encounter for antineoplastic radiation therapy: Secondary | ICD-10-CM | POA: Diagnosis not present

## 2015-08-15 ENCOUNTER — Ambulatory Visit
Admission: RE | Admit: 2015-08-15 | Discharge: 2015-08-15 | Disposition: A | Discharge status: Home / Self Care | Payer: Medicare Other | Source: Ambulatory Visit | Attending: Radiation Oncology | Admitting: Radiation Oncology

## 2015-08-15 DIAGNOSIS — Z510 Encounter for antineoplastic radiation therapy: Secondary | ICD-10-CM | POA: Diagnosis not present

## 2015-08-16 ENCOUNTER — Ambulatory Visit
Admission: RE | Admit: 2015-08-16 | Discharge: 2015-08-16 | Disposition: A | Discharge status: Home / Self Care | Payer: Medicare Other | Source: Ambulatory Visit | Attending: Radiation Oncology | Admitting: Radiation Oncology

## 2015-08-16 DIAGNOSIS — Z510 Encounter for antineoplastic radiation therapy: Secondary | ICD-10-CM | POA: Diagnosis not present

## 2015-08-17 ENCOUNTER — Ambulatory Visit
Admission: RE | Admit: 2015-08-17 | Discharge: 2015-08-17 | Disposition: A | Discharge status: Home / Self Care | Payer: Medicare Other | Source: Ambulatory Visit | Attending: Radiation Oncology | Admitting: Radiation Oncology

## 2015-08-17 DIAGNOSIS — Z510 Encounter for antineoplastic radiation therapy: Secondary | ICD-10-CM | POA: Diagnosis not present

## 2015-08-22 ENCOUNTER — Telehealth: Payer: Self-pay | Admitting: *Deleted

## 2015-08-22 ENCOUNTER — Inpatient Hospital Stay: Payer: Medicare Other | Admitting: Oncology

## 2015-08-22 NOTE — Telephone Encounter (Signed)
Preferred appt tomorrow morning at 8:45AM

## 2015-08-22 NOTE — Telephone Encounter (Signed)
Completed XRT Thursday and is having right hip and knee pain and cannot walk. Asking to be seen for evaluation today. Her next appt is not until 1/17

## 2015-08-22 NOTE — Telephone Encounter (Signed)
Ok

## 2015-08-23 ENCOUNTER — Inpatient Hospital Stay: Payer: Medicare Other

## 2015-08-23 ENCOUNTER — Inpatient Hospital Stay: Payer: Medicare Other | Attending: Oncology | Admitting: Oncology

## 2015-08-23 VITALS — BP 119/77 | HR 73 | Temp 98.7°F | Resp 18 | Wt 147.9 lb

## 2015-08-23 DIAGNOSIS — M4690 Unspecified inflammatory spondylopathy, site unspecified: Secondary | ICD-10-CM | POA: Insufficient documentation

## 2015-08-23 DIAGNOSIS — Z79811 Long term (current) use of aromatase inhibitors: Secondary | ICD-10-CM | POA: Diagnosis not present

## 2015-08-23 DIAGNOSIS — Z79899 Other long term (current) drug therapy: Secondary | ICD-10-CM | POA: Diagnosis not present

## 2015-08-23 DIAGNOSIS — C7951 Secondary malignant neoplasm of bone: Secondary | ICD-10-CM | POA: Diagnosis not present

## 2015-08-23 DIAGNOSIS — R197 Diarrhea, unspecified: Secondary | ICD-10-CM | POA: Insufficient documentation

## 2015-08-23 DIAGNOSIS — R109 Unspecified abdominal pain: Secondary | ICD-10-CM | POA: Insufficient documentation

## 2015-08-23 DIAGNOSIS — C7889 Secondary malignant neoplasm of other digestive organs: Secondary | ICD-10-CM | POA: Diagnosis not present

## 2015-08-23 DIAGNOSIS — F17210 Nicotine dependence, cigarettes, uncomplicated: Secondary | ICD-10-CM | POA: Insufficient documentation

## 2015-08-23 DIAGNOSIS — I10 Essential (primary) hypertension: Secondary | ICD-10-CM | POA: Diagnosis not present

## 2015-08-23 DIAGNOSIS — Z170 Estrogen receptor positive status [ER+]: Secondary | ICD-10-CM | POA: Insufficient documentation

## 2015-08-23 DIAGNOSIS — M25551 Pain in right hip: Secondary | ICD-10-CM | POA: Diagnosis not present

## 2015-08-23 DIAGNOSIS — C784 Secondary malignant neoplasm of small intestine: Secondary | ICD-10-CM

## 2015-08-23 DIAGNOSIS — C50919 Malignant neoplasm of unspecified site of unspecified female breast: Secondary | ICD-10-CM

## 2015-08-23 DIAGNOSIS — M659 Synovitis and tenosynovitis, unspecified: Secondary | ICD-10-CM | POA: Insufficient documentation

## 2015-08-23 DIAGNOSIS — M5126 Other intervertebral disc displacement, lumbar region: Secondary | ICD-10-CM | POA: Diagnosis not present

## 2015-08-23 DIAGNOSIS — M4806 Spinal stenosis, lumbar region: Secondary | ICD-10-CM | POA: Insufficient documentation

## 2015-08-23 DIAGNOSIS — R110 Nausea: Secondary | ICD-10-CM | POA: Insufficient documentation

## 2015-08-23 DIAGNOSIS — M549 Dorsalgia, unspecified: Secondary | ICD-10-CM | POA: Insufficient documentation

## 2015-08-23 DIAGNOSIS — Z923 Personal history of irradiation: Secondary | ICD-10-CM | POA: Diagnosis not present

## 2015-08-23 LAB — CBC WITH DIFFERENTIAL/PLATELET
Basophils Absolute: 0 10*3/uL (ref 0–0.1)
Basophils Relative: 0 %
Eosinophils Absolute: 0.1 10*3/uL (ref 0–0.7)
Eosinophils Relative: 1 %
HEMATOCRIT: 37.5 % (ref 35.0–47.0)
HEMOGLOBIN: 12.7 g/dL (ref 12.0–16.0)
Lymphocytes Relative: 8 %
Lymphs Abs: 0.5 10*3/uL — ABNORMAL LOW (ref 1.0–3.6)
MCH: 31.8 pg (ref 26.0–34.0)
MCHC: 34 g/dL (ref 32.0–36.0)
MCV: 93.5 fL (ref 80.0–100.0)
MONO ABS: 0.5 10*3/uL (ref 0.2–0.9)
MONOS PCT: 8 %
NEUTROS ABS: 5.1 10*3/uL (ref 1.4–6.5)
NEUTROS PCT: 83 %
Platelets: 219 10*3/uL (ref 150–440)
RBC: 4.01 MIL/uL (ref 3.80–5.20)
RDW: 14.1 % (ref 11.5–14.5)
WBC: 6.2 10*3/uL (ref 3.6–11.0)

## 2015-08-23 LAB — COMPREHENSIVE METABOLIC PANEL
ALBUMIN: 4 g/dL (ref 3.5–5.0)
ALK PHOS: 66 U/L (ref 38–126)
ALT: 20 U/L (ref 14–54)
AST: 19 U/L (ref 15–41)
Anion gap: 5 (ref 5–15)
BILIRUBIN TOTAL: 0.4 mg/dL (ref 0.3–1.2)
BUN: 23 mg/dL — AB (ref 6–20)
CALCIUM: 9.3 mg/dL (ref 8.9–10.3)
CO2: 30 mmol/L (ref 22–32)
Chloride: 100 mmol/L — ABNORMAL LOW (ref 101–111)
Creatinine, Ser: 1.05 mg/dL — ABNORMAL HIGH (ref 0.44–1.00)
GFR calc Af Amer: >60 mL/min (ref >60)
GFR, EST NON AFRICAN AMERICAN: 53 mL/min — AB (ref >60)
GLUCOSE: 121 mg/dL — AB (ref 65–99)
POTASSIUM: 4 mmol/L (ref 3.5–5.1)
Sodium: 135 mmol/L (ref 135–145)
TOTAL PROTEIN: 7.2 g/dL (ref 6.5–8.1)

## 2015-08-23 LAB — MAGNESIUM: MAGNESIUM: 1.8 mg/dL (ref 1.7–2.4)

## 2015-08-23 NOTE — Progress Notes (Signed)
[place]  Telephone:(336) [phone] Fax:(336) [phone]  ID: Sara David OB: 27-Nov-1945  MR#: [id number]  [id number]  [name], [name]  CHIEF COMPLAINT:  Chief Complaint  Patient presents with  . Acute Visit    worsening right hip and knee pain    INTERVAL HISTORY: Patient returns to clinic today as an add-on to discuss several new problems that have started or worsened since radiation. Her last radiation treatment was last Thursday 08/17/2015. She had 10 treatments to her hip and 15 to her spine. The back and leg pain she had previous to radiation did not improved and has since worsened. She now has trouble lifting her right leg to get in and out of the car or the shower and even trouble walking at times. She has taken to using a cane. Her leg pain starts in her right hip and radiates to her knee. She has nausea which she has had in the past but has worsened in the past month. It is worse after meals. She has diarrhea which has worsened in the last 5 days. She has intermittent abdomen pain/cramping.  She has no neurologic complaints.  She denies any chest pain or shortness of breath.  She has a good appetite and denies weight loss. She denies vomiting or constipation.  She has no melena or hematochezia.  She has no urinary complaints.  Patient offers no further specific complaints today.   REVIEW OF SYSTEMS:   Review of Systems  Constitutional: Negative.   Respiratory: Negative.   Cardiovascular: Negative.   Gastrointestinal: Positive for nausea, abdominal pain and diarrhea.  Musculoskeletal: Positive for back pain and joint pain.  Neurological: Negative.  Negative for sensory change.    As per HPI. Otherwise, a complete review of systems is negatve.  PAST MEDICAL HISTORY: Past Medical History  Diagnosis Date  . Hypertension   . Cancer ([place])   . Breast cancer ([place])     PAST SURGICAL  HISTORY: Past Surgical History  Procedure Laterality Date  . Back surgery    . Cholecystectomy      FAMILY HISTORY: Reviewed and unchanged. No reported history of breast cancer or other chronic diseases.     ADVANCED DIRECTIVES:    HEALTH MAINTENANCE: Social History  Substance Use Topics  . Smoking status: Current Every Day Smoker -- 0.50 packs/day    Types: Cigarettes  . Smokeless tobacco: Not on file  . Alcohol Use: No     Colonoscopy:  PAP:  Bone density:  Lipid panel:  Allergies  Allergen Reactions  . Diclofenac Sodium Nausea Only  . Erythromycin Diarrhea and Nausea And Vomiting  . Latex Rash  . Tape Itching and Rash  . Wellbutrin [Bupropion] Rash    Current Outpatient Prescriptions  Medication Sig Dispense Refill  . acetaminophen (TYLENOL) 500 MG tablet Take 500-1,000 mg by mouth every 6 (six) hours as needed for mild pain or headache.    . cetirizine (ZYRTEC) 10 MG tablet Take 10 mg by mouth daily.    . citalopram (CELEXA) 20 MG tablet Take 20 mg by mouth daily.    . diclofenac (VOLTAREN) 75 MG EC tablet TAKE 1 TABLET (75 MG TOTAL) BY MOUTH 2 (TWO) TIMES DAILY WITH MEALS. AFTER FOOD.  0  . fluticasone (FLONASE) 50 MCG/ACT nasal spray Place 2 sprays into both nostrils daily as needed for rhinitis.    . Fluticasone-Salmeterol (ADVAIR) 100-50 MCG/DOSE AEPB Inhale 1 puff into  the lungs 2 (two) times daily.    Marland Kitchen HYDROcodone-acetaminophen (NORCO) 7.5-325 MG tablet Take 1 tablet by mouth every 6 (six) hours as needed for moderate pain. 30 tablet 0  . letrozole (FEMARA) 2.5 MG tablet TAKE 1 TABLET ONE TIME DAILY 90 tablet 4  . levothyroxine (SYNTHROID, LEVOTHROID) 100 MCG tablet Take 100 mcg by mouth daily.    . meloxicam (MOBIC) 7.5 MG tablet Take 7.5 mg by mouth 2 (two) times daily with a meal.    . montelukast (SINGULAIR) 10 MG tablet Take 10 mg by mouth daily.    Marland Kitchen omeprazole (PRILOSEC) 20 MG capsule Take 20 mg by mouth daily.    . pravastatin (PRAVACHOL) 40 MG  tablet Take 40 mg by mouth daily.    . promethazine (PHENERGAN) 25 MG tablet Take 1 tablet (25 mg total) by mouth every 6 (six) hours as needed for nausea or vomiting. 60 tablet 1  . valsartan-hydrochlorothiazide (DIOVAN-HCT) 80-12.5 MG per tablet Take 1 tablet by mouth daily.    Marland Kitchen zolpidem (AMBIEN) 10 MG tablet TAKE ONE TABLET BY MOUTH AT BEDTIME AS NEEDED FOR SLEEP 30 tablet 0   No current facility-administered medications for this visit.    OBJECTIVE: Filed Vitals:   08/23/15 0914  BP: 119/77  Pulse: 73  Temp: 98.7 F (37.1 C)  Resp: 18     Body mass index is 25.38 kg/(m^2).    ECOG FS:0 - Asymptomatic  General: Well-developed, well-nourished, no acute distress. Eyes: Pink conjunctiva, anicteric sclera. Lungs: Clear to auscultation bilaterally. Heart: Regular rate and rhythm. No rubs, murmurs, or gallops. Abdomen: Soft, nontender, nondistended. No organomegaly noted, normoactive bowel sounds. Musculoskeletal: No edema, cyanosis, or clubbing.  Well-healing surgical scar on lumbar spine without erythema. Neuro: Alert, answering all questions appropriately. Cranial nerves grossly intact. Skin: No rashes or petechiae noted. Psych: Normal affect.   LAB RESULTS:  Lab Results  Component Value Date   NA 135 08/23/2015   K 4.0 08/23/2015   CL 100* 08/23/2015   CO2 30 08/23/2015   GLUCOSE 121* 08/23/2015   BUN 23* 08/23/2015   CREATININE 1.05* 08/23/2015   CALCIUM 9.3 08/23/2015   PROT 7.2 08/23/2015   ALBUMIN 4.0 08/23/2015   AST 19 08/23/2015   ALT 20 08/23/2015   ALKPHOS 66 08/23/2015   BILITOT 0.4 08/23/2015   GFRNONAA 53* 08/23/2015   GFRAA >60 08/23/2015    Lab Results  Component Value Date   WBC 6.2 08/23/2015   NEUTROABS 5.1 08/23/2015   HGB 12.7 08/23/2015   HCT 37.5 08/23/2015   MCV 93.5 08/23/2015   PLT 219 08/23/2015     STUDIES: No results found.  ASSESSMENT: Stage IV lobular breast cancer with metastatic lesions in small bowel, gall bladder, and  T7 vertebrae.  PLAN:    1.  Breast cancer: CA 27-29 within normal limits in October 2016, redrawn today.  PET scan scheduled for August 28, 2015.  Although patient's cholecystectomy revealed a metastatic deposit, will continue letrozole, which she will require indefinitely or until significant progression of disease. US breast scheduled for August 30, 2015. Patient has appt to followup on September 05, 2015, will keep this appt for results. 2.  Hip/back pain: MRI scheduled for September 01, 2015. Continue current narcotics as prescribed. 3.  Abdomen pain: Pet scan. 4.  Diarrhea: Mag normal. Imodium as needed.  5.  Nausea: Phenergan, take as needed.   Approximately 30 minutes was spent in discussion of which greater than 50% was consultation.  Patient expressed understanding and was in agreement with this plan. She also understands that She can call clinic at any time with any questions, concerns, or complaints.   Mayra Reel, NP   08/23/2015 12:46 PM   Patient was seen and evaluated independently and I agree with the assessment and plan as dictated above.

## 2015-08-23 NOTE — Progress Notes (Signed)
Patient's right hip and knee pain is getting worse despite receiving XRT.  Having abdominal pain in episodes 3-4 times a week that is severe.

## 2015-08-24 LAB — CANCER ANTIGEN 27.29: CA 27.29: 15 U/mL (ref 0.0–38.6)

## 2015-08-28 ENCOUNTER — Ambulatory Visit
Admission: RE | Admit: 2015-08-28 | Discharge: 2015-08-28 | Disposition: A | Discharge status: Home / Self Care | Payer: Medicare Other | Source: Ambulatory Visit | Attending: Oncology | Admitting: Oncology

## 2015-08-28 DIAGNOSIS — C7951 Secondary malignant neoplasm of bone: Secondary | ICD-10-CM | POA: Diagnosis present

## 2015-08-28 DIAGNOSIS — M25559 Pain in unspecified hip: Secondary | ICD-10-CM | POA: Insufficient documentation

## 2015-08-28 DIAGNOSIS — R109 Unspecified abdominal pain: Secondary | ICD-10-CM | POA: Insufficient documentation

## 2015-08-28 DIAGNOSIS — C50919 Malignant neoplasm of unspecified site of unspecified female breast: Secondary | ICD-10-CM

## 2015-08-28 DIAGNOSIS — Z0189 Encounter for other specified special examinations: Secondary | ICD-10-CM | POA: Diagnosis present

## 2015-08-28 LAB — GLUCOSE, CAPILLARY: GLUCOSE-CAPILLARY: 95 mg/dL (ref 65–99)

## 2015-08-28 MED ORDER — FLUDEOXYGLUCOSE F - 18 (FDG) INJECTION
12.3400 | Freq: Once | INTRAVENOUS | Status: AC | PRN
  Administered 2015-08-28: 12.34 via INTRAVENOUS

## 2015-08-30 ENCOUNTER — Ambulatory Visit
Admission: RE | Admit: 2015-08-30 | Discharge: 2015-08-30 | Disposition: A | Discharge status: Home / Self Care | Payer: Medicare Other | Source: Ambulatory Visit | Attending: Oncology | Admitting: Oncology

## 2015-08-30 DIAGNOSIS — C50919 Malignant neoplasm of unspecified site of unspecified female breast: Secondary | ICD-10-CM

## 2015-08-30 DIAGNOSIS — M8580 Other specified disorders of bone density and structure, unspecified site: Secondary | ICD-10-CM

## 2015-08-30 DIAGNOSIS — C7951 Secondary malignant neoplasm of bone: Principal | ICD-10-CM

## 2015-09-01 ENCOUNTER — Ambulatory Visit (HOSPITAL_COMMUNITY)
Admission: RE | Admit: 2015-09-01 | Discharge: 2015-09-01 | Disposition: A | Discharge status: Home / Self Care | Payer: Medicare Other | Source: Ambulatory Visit | Attending: Oncology | Admitting: Oncology

## 2015-09-01 DIAGNOSIS — M4806 Spinal stenosis, lumbar region: Secondary | ICD-10-CM | POA: Diagnosis not present

## 2015-09-01 DIAGNOSIS — C50919 Malignant neoplasm of unspecified site of unspecified female breast: Secondary | ICD-10-CM

## 2015-09-01 DIAGNOSIS — M5126 Other intervertebral disc displacement, lumbar region: Secondary | ICD-10-CM | POA: Diagnosis not present

## 2015-09-01 DIAGNOSIS — M659 Synovitis and tenosynovitis, unspecified: Secondary | ICD-10-CM | POA: Insufficient documentation

## 2015-09-01 DIAGNOSIS — M25551 Pain in right hip: Secondary | ICD-10-CM | POA: Insufficient documentation

## 2015-09-01 DIAGNOSIS — M4606 Spinal enthesopathy, lumbar region: Secondary | ICD-10-CM | POA: Insufficient documentation

## 2015-09-01 DIAGNOSIS — M71351 Other bursal cyst, right hip: Secondary | ICD-10-CM | POA: Insufficient documentation

## 2015-09-01 DIAGNOSIS — M899 Disorder of bone, unspecified: Secondary | ICD-10-CM | POA: Insufficient documentation

## 2015-09-01 DIAGNOSIS — M545 Low back pain: Secondary | ICD-10-CM | POA: Insufficient documentation

## 2015-09-01 DIAGNOSIS — M1611 Unilateral primary osteoarthritis, right hip: Secondary | ICD-10-CM | POA: Insufficient documentation

## 2015-09-01 DIAGNOSIS — M8938 Hypertrophy of bone, other site: Secondary | ICD-10-CM | POA: Diagnosis not present

## 2015-09-01 MED ORDER — GADOBENATE DIMEGLUMINE 529 MG/ML IV SOLN
15.0000 mL | Freq: Once | INTRAVENOUS | Status: AC | PRN
  Administered 2015-09-01: 15 mL via INTRAVENOUS

## 2015-09-05 ENCOUNTER — Encounter: Payer: Self-pay | Admitting: Oncology

## 2015-09-05 ENCOUNTER — Other Ambulatory Visit: Payer: Self-pay | Admitting: Oncology

## 2015-09-05 ENCOUNTER — Encounter: Payer: Self-pay | Admitting: Radiation Oncology

## 2015-09-05 ENCOUNTER — Ambulatory Visit
Admission: RE | Admit: 2015-09-05 | Discharge: 2015-09-05 | Disposition: A | Discharge status: Home / Self Care | Payer: Medicare Other | Source: Ambulatory Visit | Attending: Radiation Oncology | Admitting: Radiation Oncology

## 2015-09-05 ENCOUNTER — Inpatient Hospital Stay (HOSPITAL_BASED_OUTPATIENT_CLINIC_OR_DEPARTMENT_OTHER): Payer: Medicare Other | Admitting: Oncology

## 2015-09-05 VITALS — BP 133/78 | HR 69 | Temp 97.6°F | Resp 18 | Ht 63.78 in | Wt 145.9 lb

## 2015-09-05 VITALS — BP 137/77 | HR 82 | Temp 98.2°F | Resp 20 | Wt 146.3 lb

## 2015-09-05 DIAGNOSIS — C7951 Secondary malignant neoplasm of bone: Secondary | ICD-10-CM

## 2015-09-05 DIAGNOSIS — M4690 Unspecified inflammatory spondylopathy, site unspecified: Secondary | ICD-10-CM

## 2015-09-05 DIAGNOSIS — F17210 Nicotine dependence, cigarettes, uncomplicated: Secondary | ICD-10-CM

## 2015-09-05 DIAGNOSIS — C50919 Malignant neoplasm of unspecified site of unspecified female breast: Secondary | ICD-10-CM

## 2015-09-05 DIAGNOSIS — Z79811 Long term (current) use of aromatase inhibitors: Secondary | ICD-10-CM

## 2015-09-05 DIAGNOSIS — M25551 Pain in right hip: Secondary | ICD-10-CM

## 2015-09-05 DIAGNOSIS — C50911 Malignant neoplasm of unspecified site of right female breast: Secondary | ICD-10-CM

## 2015-09-05 DIAGNOSIS — M549 Dorsalgia, unspecified: Secondary | ICD-10-CM

## 2015-09-05 DIAGNOSIS — R110 Nausea: Secondary | ICD-10-CM

## 2015-09-05 DIAGNOSIS — R109 Unspecified abdominal pain: Secondary | ICD-10-CM

## 2015-09-05 DIAGNOSIS — C7889 Secondary malignant neoplasm of other digestive organs: Secondary | ICD-10-CM

## 2015-09-05 DIAGNOSIS — C784 Secondary malignant neoplasm of small intestine: Secondary | ICD-10-CM

## 2015-09-05 DIAGNOSIS — M659 Synovitis and tenosynovitis, unspecified: Secondary | ICD-10-CM

## 2015-09-05 DIAGNOSIS — M5126 Other intervertebral disc displacement, lumbar region: Secondary | ICD-10-CM

## 2015-09-05 DIAGNOSIS — M4806 Spinal stenosis, lumbar region: Secondary | ICD-10-CM

## 2015-09-05 DIAGNOSIS — Z79899 Other long term (current) drug therapy: Secondary | ICD-10-CM

## 2015-09-05 DIAGNOSIS — Z923 Personal history of irradiation: Secondary | ICD-10-CM

## 2015-09-05 DIAGNOSIS — Z170 Estrogen receptor positive status [ER+]: Secondary | ICD-10-CM

## 2015-09-05 MED ORDER — HYDROCODONE-ACETAMINOPHEN 7.5-325 MG PO TABS: 1.0000 | ORAL_TABLET | Freq: Four times a day (QID) | ORAL | Status: DC | PRN

## 2015-09-05 NOTE — Progress Notes (Signed)
Radiation Oncology Follow up Note  Name: Sara David   Date:   09/05/2015 MRN:  [id number] DOB: 26-May-1946    This 70 y.o. female presents to the clinic today for follow-up for stage IV breast cancer status post palliative treatment to her right hip and spine.  REFERRING PROVIDER: Marinda Elk, MD  HPI: Patient is a 70 year old female now out 2 weeks having completed palliative radiation therapy to her thoracic lumbar spine as well as right hip for stage IV breast cancer. She is seen today in routine follow-up and is feeling quite worn out and fatigued. She also still having pain in her right hip which we would expect to weightbearing area with marked destruction of metastatic disease. She's also having some what she describes as slight nausea and a feeling of upset upset stomach. She specifically denies any vomiting or significant diarrhea.. She had a recent PET CT scan which shows no evidence of hypermetabolic activity fairly interesting based on her previous positive bone scan results.  COMPLICATIONS OF TREATMENT: none  FOLLOW UP COMPLIANCE: keeps appointments   PHYSICAL EXAM:  BP 137/77 mmHg  Pulse 82  Temp(Src) 98.2 F (36.8 C)  Resp 20  Wt 146 lb 4.4 oz (66.35 kg) Well-developed frail female in NAD. Range of motion of her lower extremities does not elicit pain she is has some guarding in the right lower extremity which manifests as some reduced strength. Deep palpation of her spine does not elicit pain.  RADIOLOGY RESULTS: Bone scan and PET CT scan are reviewed  PLAN: I discussed case personally with medical oncology. I see no the role for palliative radiation therapy at this time. She may be candidate for Zometa therapy to help heal her bones. I've also start her on Carafate 1 g 3 times a day to help with some of her GI complaints. I will turn follow-up care at this point over to medical oncology. Would be happy to reevaluate the patient any time should further palliative  radiation be indicated.  I would like to take this opportunity for allowing me to participate in the care of your patient.Armstead Peaks., MD

## 2015-09-05 NOTE — Progress Notes (Signed)
Patient here today for follow up [facility] and results of scans. Reports pain 5/10 in right hip. Denies nausea or vomiting at this time.

## 2015-09-06 ENCOUNTER — Other Ambulatory Visit: Payer: Self-pay | Admitting: *Deleted

## 2015-09-06 MED ORDER — SUCRALFATE 1 G PO TABS: 1.0000 g | ORAL_TABLET | Freq: Three times a day (TID) | ORAL | Status: DC

## 2015-09-06 NOTE — Progress Notes (Signed)
[place]  [phone] [phone]) [phone]  ID: [name] OB: 05-01-1946  MR#: [id number]  [id number]  [name], [name]  CHIEF COMPLAINT:  Chief Complaint  Patient presents with  . Breast Cancer    INTERVAL HISTORY: Patient returns to clinic today for further evaluation and discussion of her imaging results. She continues to have 5 out of 10 pain in her hip and limited mobility, but states it has mildly improved. She otherwise feels well. Her leg pain starts in her right hip and radiates to her knee.  She has no neurologic complaints.  She denies any chest pain or shortness of breath.  She has a fair appetite and denies weight loss. She denies vomiting or constipation.  She has no melena or hematochezia.  She has no urinary complaints.  Patient offers no further specific complaints today.   REVIEW OF SYSTEMS:   Review of Systems  Constitutional: Negative.   Respiratory: Negative.   Cardiovascular: Negative.   Gastrointestinal: Negative.  Negative for nausea, abdominal pain and diarrhea.  Musculoskeletal: Positive for back pain and joint pain.  Neurological: Negative.  Negative for sensory change.    As per HPI. Otherwise, a complete review of systems is negatve.  PAST MEDICAL HISTORY: Past Medical History  Diagnosis Date  . Hypertension   . Cancer ([place])     small intestines 2012, gallbladder 2016  . Breast cancer ([place]) 2012     no lumpectomy. Treating with meds    PAST SURGICAL HISTORY: Past Surgical History  Procedure Laterality Date  . Back surgery    . Cholecystectomy    . Breast biopsy Right 2012    positive  . Breast biopsy Right 2014    positive  . Breast excisional biopsy Right 1995    benign    FAMILY HISTORY: Reviewed and unchanged. No reported history of breast cancer or other chronic diseases.     ADVANCED DIRECTIVES:    HEALTH  MAINTENANCE: Social History  Substance Use Topics  . Smoking status: Current Every Day Smoker -- 0.50 packs/day    Types: Cigarettes  . Smokeless tobacco: None  . Alcohol Use: No     Colonoscopy:  PAP:  Bone density:  Lipid panel:  Allergies  Allergen Reactions  . Diclofenac Sodium Nausea Only  . Erythromycin Diarrhea and Nausea And Vomiting  . Latex Rash  . Tape Itching and Rash  . Wellbutrin [Bupropion] Rash    Current Outpatient Prescriptions  Medication Sig Dispense Refill  . acetaminophen (TYLENOL) 500 MG tablet Take 500-1,000 mg by mouth every 6 (six) hours as needed for mild pain or headache.    . cetirizine (ZYRTEC) 10 MG tablet Take 10 mg by mouth daily.    . citalopram (CELEXA) 20 MG tablet Take 20 mg by mouth daily.    Marland Kitchen dicyclomine (BENTYL) 10 MG capsule TAKE 1 CAPSULE (10 MG TOTAL) BY MOUTH 3 (THREE) TIMES DAILY AS NEEDED.  2  . fluticasone (FLONASE) 50 MCG/ACT nasal spray Place 2 sprays into both nostrils daily as needed for rhinitis.    . Fluticasone-Salmeterol (ADVAIR) 100-50 MCG/DOSE AEPB Inhale 1 puff into the lungs 2 (two) times daily.    Marland Kitchen HYDROcodone-acetaminophen (NORCO) 7.5-325 MG tablet Take 1 tablet by mouth every 6 (six) hours as needed for moderate pain. 90 tablet 0  . letrozole (FEMARA) 2.5 MG tablet TAKE 1 TABLET ONE TIME DAILY 90 tablet 4  . levothyroxine (SYNTHROID,  LEVOTHROID) 100 MCG tablet Take 100 mcg by mouth daily.    . meloxicam (MOBIC) 7.5 MG tablet Take 7.5 mg by mouth 2 (two) times daily with a meal.    . montelukast (SINGULAIR) 10 MG tablet Take 10 mg by mouth daily.    Marland Kitchen omeprazole (PRILOSEC) 20 MG capsule Take 20 mg by mouth daily.    . pravastatin (PRAVACHOL) 40 MG tablet Take 40 mg by mouth daily.    . promethazine (PHENERGAN) 25 MG tablet Take 1 tablet (25 mg total) by mouth every 6 (six) hours as needed for nausea or vomiting. 60 tablet 1  . valsartan-hydrochlorothiazide (DIOVAN-HCT) 80-12.5 MG per tablet Take 1 tablet by mouth  daily.    Marland Kitchen zolpidem (AMBIEN) 10 MG tablet TAKE ONE TABLET BY MOUTH AT BEDTIME AS NEEDED FOR SLEEP 30 tablet 0  . sucralfate (CARAFATE) 1 g tablet Take 1 tablet (1 g total) by mouth 3 (three) times daily. 90 tablet 3   No current facility-administered medications for this visit.    OBJECTIVE: Filed Vitals:   09/05/15 1128  BP: 133/78  Pulse: 69  Temp: 97.6 F (36.4 C)  Resp: 18     Body mass index is 25.22 kg/(m^2).    ECOG FS:1 - Symptomatic but completely ambulatory  General: Well-developed, well-nourished, no acute distress. Eyes: Pink conjunctiva, anicteric sclera. Lungs: Clear to auscultation bilaterally. Heart: Regular rate and rhythm. No rubs, murmurs, or gallops. Abdomen: Soft, nontender, nondistended. No organomegaly noted, normoactive bowel sounds. Musculoskeletal: No edema, cyanosis, or clubbing.  Well-healing surgical scar on lumbar spine without erythema. Neuro: Alert, answering all questions appropriately. Cranial nerves grossly intact. Skin: No rashes or petechiae noted. Psych: Normal affect.   LAB RESULTS:  Lab Results  Component Value Date   NA 135 08/23/2015   K 4.0 08/23/2015   CL 100* 08/23/2015   CO2 30 08/23/2015   GLUCOSE 121* 08/23/2015   BUN 23* 08/23/2015   CREATININE 1.05* 08/23/2015   CALCIUM 9.3 08/23/2015   PROT 7.2 08/23/2015   ALBUMIN 4.0 08/23/2015   AST 19 08/23/2015   ALT 20 08/23/2015   ALKPHOS 66 08/23/2015   BILITOT 0.4 08/23/2015   GFRNONAA 53* 08/23/2015   GFRAA >60 08/23/2015    Lab Results  Component Value Date   WBC 6.2 08/23/2015   NEUTROABS 5.1 08/23/2015   HGB 12.7 08/23/2015   HCT 37.5 08/23/2015   MCV 93.5 08/23/2015   PLT 219 08/23/2015     STUDIES: Mr Lumbar Spine W Wo Contrast  09/02/2015  CLINICAL DATA:  Low back pain. RIGHT hip pain. Breast cancer. History of spine surgery. EXAM: MRI LUMBAR SPINE WITHOUT AND WITH CONTRAST TECHNIQUE: Multiplanar and multiecho pulse sequences of the lumbar spine were  obtained without and with intravenous contrast. CONTRAST:  MultiHance 15 mL. COMPARISON:  Recent MR 06/29/2015. Recent PET scan 08/28/2015. Pelvic/Hip MRI reported separately. Please see that study for additional significant findings on the RIGHT. FINDINGS: Segmentation: Normal. Alignment:  Normal. Vertebrae: Multiple osseous lesions are essentially stable since November 2016. Concern was raised for increased size and enhancement of lesions in L1, T12, and S1 but there were no foci of hypermetabolism on recent PET scan. Compared with November, no progression of these lesions has occurred. Increasing endplate reactive changes above and below L3-4, the site of previous lumbar decompression. These are degenerative in nature. Conus medullaris: Normal in size, signal, and location. No abnormal intraspinal enhancement Paraspinal tissues: No evidence for hydronephrosis or paravertebral mass. Disc levels: L1-L2:  Unremarkable disc space.  Mild facet arthropathy. L2-L3: Disc space narrowing. Central and leftward protrusion. Mild stenosis. Moderate posterior element hypertrophy. LEFT subarticular zone narrowing could affect the LEFT L3 nerve root. L3-L4: Postsurgical changes on the LEFT. Moderate postcontrast enhancement. No signs of infection. Progressive loss of interspace height, along with disc material and osseous spurring contribute to LEFT-sided foraminal narrowing. LEFT L3 nerve root impingement is possible. L4-L5: Disc space narrowing. Marked osseous spurring on the RIGHT extends to the foramen and beyond. See for instance image 27 series 700. Superimposed RIGHT subarticular zone narrowing due to facet arthropathy and ligamentum flavum hypertrophy, possible synovial cyst. See image 6 series 300. Likely symptomatic RIGHT L4 and RIGHT L5 nerve root impingement. L5-S1:  Mild bulge.  No impingement. Compared with the prior MR 06/29/2015, the foraminal narrowing and subarticular zone narrowing at L4-5 on the RIGHT appears  increased. Also when compared with November, the areas of postcontrast enhancement appear to be either stable osseous lesions not shown to be hypermetabolic, or active ongoing degenerative change. Scarring is seen in the paravertebral soft tissues showing moderate enhancement L3-4 on the LEFT. No abnormal intradural enhancement. IMPRESSION: Multiple osseous lesions, non hypermetabolic on PET, could represent treated metastases. No progression of disease from priors. Further loss of interspace height at L3-4 associated with discectomy is accompanied by endplate reactive changes. Asymmetric RIGHT subarticular zone and foraminal zone narrowing at L4-5 related to disc protrusion, osseous spurring, and asymmetric posterior element hypertrophy. RIGHT L4 and RIGHT L5 nerve root impingement likely. Correlate clinically as a cause of RIGHT leg pain. Electronically Signed   By: Staci Righter M.D.   On: 09/02/2015 08:48   Mr Hip Right W Wo Contrast  09/02/2015  CLINICAL DATA:  70 year old with progressive back and right hip pain for 5 months. History of breast cancer in 2012. EXAM: MRI OF THE RIGHT HIP WITHOUT AND WITH CONTRAST TECHNIQUE: Multiplanar, multisequence MR imaging was performed both before and after administration of intravenous contrast. CONTRAST:  33mL MULTIHANCE GADOBENATE DIMEGLUMINE 529 MG/ML IV SOLN COMPARISON:  PET-CT 08/28/2015. Bone scan 07/18/2015 and abdominal CT 02/01/2015. FINDINGS: Bones: There are severe arthropathic changes at the right hip with joint space loss, osteophytes and subchondral cyst formation in the acetabulum and femoral head. There is bone marrow edema throughout the femoral head and neck. Post-contrast, there is marrow enhancement throughout the femoral neck. No focal lytic lesion or fracture is identified. The left femoral head demonstrates no suspicious findings. The visualized bony pelvis appears normal. The sacroiliac joints and symphysis pubis appear normal. Articular  cartilage and labrum Articular cartilage: As above, there is severe right hip arthropathy. Mild degenerative changes are present at the left hip. Labrum: There is diffuse degenerative tearing of the right acetabular labrum with paralabral cyst formation laterally. Joint or bursal effusion Joint effusion: There is a small complex right hip joint effusion with diffuse synovial enhancement surrounding the hip. No significant hip joint effusion on the left. Bursae: There is superior extension of complex synovial cyst from the right hip into the gluteus minimus musculature, most fully imaged on the coronal images. These measure up to 1.7 cm in diameter and demonstrate peripheral enhancement following contrast. There is a small amount of greater trochanteric bursal fluid bilaterally. Muscles and tendons Muscles and tendons: As above, there are inflammatory changes within the right gluteus musculature with synovial cysts and muscular enhancement. No soft tissue mass is demonstrated. The hamstring and iliopsoas tendons appear normal. Other findings Miscellaneous: Hysterectomy. No evidence adnexal  mass. Sigmoid colon diverticular changes are present. No enlarged pelvic lymph nodes identified. IMPRESSION: 1. 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 Given the chronicity of the patient's symptoms, the lack of leukocytosis on recent blood work and the lack of hypermetabolic activity in the right hip on PET-CT performed 4 days ago, infection is highly unlikely. 2. No evidence of metastatic disease or acute fracture. 3. Associated diffuse right acetabular labral degenerative tearing with paralabral cyst formation and superior extension of synovial cysts into the gluteus minimus musculature. Electronically Signed   By: Richardean Sale M.D.   On: 09/02/2015 08:37   Nm Pet Image Restag (ps) Skull Base To Thigh  08/28/2015  CLINICAL DATA:  Initial treatment strategy for  breast cancer. EXAM: NUCLEAR MEDICINE PET SKULL BASE TO THIGH TECHNIQUE: Fall 0.3 MCi F-18 FDG was injected intravenously. Full-ring PET imaging was performed from the skull base to thigh after the radiotracer. CT data was obtained and used for attenuation correction and anatomic localization. FASTING BLOOD GLUCOSE:  Value: 95 mg/dl COMPARISON:  Bone scan 07/18/2015 and MR lumbar spine 06/29/2015. CT abdomen pelvis 02/01/2015 and CT chest abdomen pelvis 01/30/2015. FINDINGS: NECK No hypermetabolic lymph nodes in the neck. CT images show no acute findings. CHEST No hypermetabolic mediastinal, hilar or axillary lymph nodes. No hypermetabolic pulmonary nodules. CT images show three-vessel coronary artery calcification. No pericardial or pleural effusion. ABDOMEN/PELVIS No abnormal hypermetabolism in the liver, adrenal glands, spleen or pancreas. No hypermetabolic lymph nodes. CT images show the liver, adrenal glands, kidneys, spleen, pancreas, stomach and bowel to be grossly unremarkable. There are postoperative changes in the small bowel, incidentally noted. Ventral hernia repair with associated fluid along the ventral abdominal wall, deep to the rectus abdominus musculature, unchanged. SKELETON No abnormal osseous hypermetabolism. Mild patchy uptake in the thoracic spine. No definite focal lesions, as suspected on examinations of 07/18/2015 and 06/29/2015) referenced above). IMPRESSION: No evidence metastatic disease. No abnormal hypermetabolism associated with suspected lesions in the spine and right femoral head. Electronically Signed   By: Lorin Picket M.D.   On: 08/28/2015 10:27   Dg Bone Density  08/30/2015  EXAM: DUAL X-RAY ABSORPTIOMETRY (DXA) FOR BONE MINERAL DENSITY IMPRESSION: Dear Lloyd Huger, Your patient Lindsey Goates completed a BMD test on 08/30/2015 using the [facility] (analysis version: 14.10) manufactured by [employer]. The following summarizes the results of our  evaluation. PATIENT BIOGRAPHICAL: Name: Mayfred, Welson Patient ID: [id number] Birth Date: [birth year] Height: 64.0 in. Gender: Female Exam Date: 08/30/2015 Weight: 148.0 lbs. Indications: Advanced Age, Breast CA, Caucasian, Family Hx of Osteoporosis, Height Loss, High Risk Meds, History of Spinal Surgery, Hysterectomy, Oopherectomy Bilateral, Postmenopausal, Tobacco User(Current Smoker) Fractures: Treatments: Advair Inhaler, letrozole, LEVOTHYROXINE, Multi-Vitamin with calcium ASSESSMENT: The BMD measured at Femur Total Left is 1.139 g/cm2 with a T-score of 1.0. This patient is considered NORMAL according to [facility]) criteria. L3 and L4 were excluded due to degenerative changes. Site Region Measured Measured WHO Young Adult BMD Date       Age      Classification T-score AP Spine L1-L2 8/30/[age] 69.4 Normal 3.4 1.583 g/cm2 AP Spine L1-L2 07/26/2014 68.3 Normal 3.2 1.562 g/cm2 DualFemur Total Left 08/30/2015 69.4 Normal 1.0 1.139 g/cm2 DualFemur Total Left 07[age]26/2014 68.3 Normal 0.9 1.119 g/cm2 [facility]) criteria for post-menopausal, Caucasian Women: Normal:       T-score at or above -1 SD Osteopenia:   T-score between -1 and -2.5 SD  Osteoporosis: T-score at or below -2.5 SD RECOMMENDATIONS: [facility] recommends that FDA-approved medical therapies be considered in postmenopausal women and men age 78 or older with a: 1. Hip or vertebral (clinical or morphometric) fracture. 2. T-score of < -2.5 at the spine or hip. 3. Ten-year fracture probability by FRAX of 3% or greater for hip fracture or 20% or greater for major osteoporotic fracture. All treatment decisions require clinical judgment and consideration of individual patient factors, including patient preferences, co-morbidities, previous drug use, risk factors not captured in the FRAX model (e.g. falls, vitamin D deficiency, increased bone turnover, interval significant decline in bone density) and  possible under - or over-estimation of fracture risk by FRAX. All patients should ensure an adequate intake of dietary calcium (1200 mg/d) and vitamin D (800 IU daily) unless contraindicated. FOLLOW-UP: People with diagnosed cases of osteoporosis or at high risk for fracture should have regular bone mineral density tests. For patients eligible for Medicare, routine testing is allowed once every 2 years. The testing frequency can be increased to one year for patients who have rapidly progressing disease, those who are receiving or discontinuing medical therapy to restore bone mass, or have additional risk factors. I have reviewed this report, and agree with the above findings. Mark A. Thornton Papas, M.D. [facility] Electronically Signed   By: Lavonia Dana M.D.   On: 08/30/2015 14:35   US Breast Ltd Uni Left Inc Axilla  08/30/2015  CLINICAL DATA:  Patient has a history of metastatic carcinoma felt to be due to a breast primary involving the serosa of the small bowel as well as the cystic duct. She also has a reported T7 metastatic lesion. Breast MRI in 2012 demonstrated abnormal enhancement within the upper-outer quadrant of the right breast measuring 5.2 cm in diameter. A surgical excisional biopsy was performed at that time and was found to represent ER positive invasive lobular carcinoma. Tumor was present at the margins. There was also a stereotactic biopsy performed 05/03/2013 which demonstrated invasive lobular carcinoma with microcalcifications. The patient has been on antiestrogen treatment. There has been no sentinel lymph node biopsy. EXAM: DIGITAL DIAGNOSTIC BILATERAL MAMMOGRAM WITH 3D TOMOSYNTHESIS WITH CAD ULTRASOUND LEFT BREAST COMPARISON:  Previous examinations the most recent which is dated 03/16/2013. ACR Breast Density Category c: The breast tissue is heterogeneously dense, which may obscure small masses. FINDINGS: There is mild asymmetry within the central slightly medial left breast. However,  this appears stable when compared with prior studies dating back to 07/27/2008. There is distortion within the upper-outer quadrant of the right breast related to the patient's previous surgical excisional biopsy. In addition, there are dystrophic calcifications in this region and a top hat shaped clip related to the previous stereotactic biopsy performed in 2014. Mammographic images were processed with CAD. On physical exam, there is no discrete palpable abnormality in the area of mild asymmetry located within the central medial left breast. Targeted ultrasound is performed, showing normal appearing fibroglandular tissue in the area of mild asymmetry located within the central slightly medial left breast. There is no mass, distortion, or worrisome shadowing. IMPRESSION: Stable breast parenchymal pattern. No findings worrisome for worsening malignancy. RECOMMENDATION: Bilateral diagnostic mammography in 1 year. I have discussed the findings and recommendations with the patient. Results were also provided in writing at the conclusion of the visit. If applicable, a reminder letter will be sent to the patient regarding the next appointment. BI-RADS CATEGORY  6: Known biopsy-proven malignancy. Electronically Signed   By: Oval Linsey  Glennon Mac M.D.   On: 08/30/2015 16:07   Mm Diag Breast Tomo Bilateral  08/30/2015  CLINICAL DATA:  Patient has a history of metastatic carcinoma felt to be due to a breast primary involving the serosa of the small bowel as well as the cystic duct. She also has a reported T7 metastatic lesion. Breast MRI in 2012 demonstrated abnormal enhancement within the upper-outer quadrant of the right breast measuring 5.2 cm in diameter. A surgical excisional biopsy was performed at that time and was found to represent ER positive invasive lobular carcinoma. Tumor was present at the margins. There was also a stereotactic biopsy performed 05/03/2013 which demonstrated invasive lobular carcinoma with  microcalcifications. The patient has been on antiestrogen treatment. There has been no sentinel lymph node biopsy. EXAM: DIGITAL DIAGNOSTIC BILATERAL MAMMOGRAM WITH 3D TOMOSYNTHESIS WITH CAD ULTRASOUND LEFT BREAST COMPARISON:  Previous examinations the most recent which is dated 03/16/2013. ACR Breast Density Category c: The breast tissue is heterogeneously dense, which may obscure small masses. FINDINGS: There is mild asymmetry within the central slightly medial left breast. However, this appears stable when compared with prior studies dating back to 07/27/2008. There is distortion within the upper-outer quadrant of the right breast related to the patient's previous surgical excisional biopsy. In addition, there are dystrophic calcifications in this region and a top hat shaped clip related to the previous stereotactic biopsy performed in 2014. Mammographic images were processed with CAD. On physical exam, there is no discrete palpable abnormality in the area of mild asymmetry located within the central medial left breast. Targeted ultrasound is performed, showing normal appearing fibroglandular tissue in the area of mild asymmetry located within the central slightly medial left breast. There is no mass, distortion, or worrisome shadowing. IMPRESSION: Stable breast parenchymal pattern. No findings worrisome for worsening malignancy. RECOMMENDATION: Bilateral diagnostic mammography in 1 year. I have discussed the findings and recommendations with the patient. Results were also provided in writing at the conclusion of the visit. If applicable, a reminder letter will be sent to the patient regarding the next appointment. BI-RADS CATEGORY  6: Known biopsy-proven malignancy. Electronically Signed   By: Altamese Cabal M.D.   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  PLAN:    1.  Breast cancer: CA 27-29 continues to  be within normal limits.  PET and MRI results reviewed independently and reported as above. Patient has no obvious evidence of recurrence.  Continue letrozole, which she will require indefinitely or until significant progression of disease. Return to clinic in 6 weeks with repeat laboratory work and further evaluation. 2.  Hip/back pain: MRI and PET scan results as above. Patient was given a referral back to orthopedics for further evaluation. No obvious evidence of recurrent malignancy.  Continue current narcotics as prescribed. 3.  Diarrhea: Resolved. Continue Imodium as needed.  4.  Nausea: Phenergan, take as needed.   Approximately 30 minutes was spent in discussion of which greater than 50% was consultation.   Patient expressed understanding and was in agreement with this plan. She also understands that She can call clinic at any time with any questions, concerns, or complaints.   Lloyd Huger, MD   09/06/2015 12:45 PM

## 2015-09-27 ENCOUNTER — Encounter
Admission: RE | Admit: 2015-09-27 | Discharge: 2015-09-27 | Disposition: A | Discharge status: Home / Self Care | Payer: Medicare Other | Source: Ambulatory Visit | Attending: Orthopedic Surgery | Admitting: Orthopedic Surgery

## 2015-09-27 DIAGNOSIS — Z0183 Encounter for blood typing: Secondary | ICD-10-CM | POA: Diagnosis not present

## 2015-09-27 DIAGNOSIS — M25551 Pain in right hip: Secondary | ICD-10-CM | POA: Diagnosis not present

## 2015-09-27 DIAGNOSIS — Z01812 Encounter for preprocedural laboratory examination: Secondary | ICD-10-CM | POA: Insufficient documentation

## 2015-09-27 DIAGNOSIS — Z881 Allergy status to other antibiotic agents status: Secondary | ICD-10-CM | POA: Diagnosis not present

## 2015-09-27 DIAGNOSIS — Z888 Allergy status to other drugs, medicaments and biological substances status: Secondary | ICD-10-CM | POA: Diagnosis not present

## 2015-09-27 DIAGNOSIS — Z91040 Latex allergy status: Secondary | ICD-10-CM | POA: Insufficient documentation

## 2015-09-27 HISTORY — DX: Family history of other specified conditions: Z84.89

## 2015-09-27 LAB — SEDIMENTATION RATE: Sed Rate: 29 mm/hr (ref 0–30)

## 2015-09-27 LAB — CBC
HCT: 37.1 % (ref 35.0–47.0)
Hemoglobin: 12.7 g/dL (ref 12.0–16.0)
MCH: 33 pg (ref 26.0–34.0)
MCHC: 34.4 g/dL (ref 32.0–36.0)
MCV: 96.1 fL (ref 80.0–100.0)
PLATELETS: 236 10*3/uL (ref 150–440)
RBC: 3.86 MIL/uL (ref 3.80–5.20)
RDW: 14.4 % (ref 11.5–14.5)
WBC: 5.6 10*3/uL (ref 3.6–11.0)

## 2015-09-27 LAB — TYPE AND SCREEN
ABO/RH(D): O NEG
Antibody Screen: NEGATIVE

## 2015-09-27 LAB — URINALYSIS COMPLETE WITH MICROSCOPIC (ARMC ONLY)
BACTERIA UA: NONE SEEN
BILIRUBIN URINE: NEGATIVE
GLUCOSE, UA: NEGATIVE mg/dL
HGB URINE DIPSTICK: NEGATIVE
Ketones, ur: NEGATIVE mg/dL
LEUKOCYTES UA: NEGATIVE
NITRITE: NEGATIVE
PH: 5 (ref 5.0–8.0)
Protein, ur: NEGATIVE mg/dL
RBC / HPF: NONE SEEN RBC/hpf (ref 0–5)
SPECIFIC GRAVITY, URINE: 1.017 (ref 1.005–1.030)
Squamous Epithelial / LPF: NONE SEEN

## 2015-09-27 LAB — BASIC METABOLIC PANEL
ANION GAP: 6 (ref 5–15)
BUN: 22 mg/dL — ABNORMAL HIGH (ref 6–20)
CALCIUM: 9.5 mg/dL (ref 8.9–10.3)
CO2: 33 mmol/L — ABNORMAL HIGH (ref 22–32)
CREATININE: 1.04 mg/dL — AB (ref 0.44–1.00)
Chloride: 99 mmol/L — ABNORMAL LOW (ref 101–111)
GFR, EST NON AFRICAN AMERICAN: 54 mL/min — AB (ref >60)
Glucose, Bld: 112 mg/dL — ABNORMAL HIGH (ref 65–99)
Potassium: 3.4 mmol/L — ABNORMAL LOW (ref 3.5–5.1)
Sodium: 138 mmol/L (ref 135–145)

## 2015-09-27 LAB — PROTIME-INR
INR: 0.95
Prothrombin Time: 12.9 seconds (ref 11.4–15.0)

## 2015-09-27 LAB — SURGICAL PCR SCREEN
MRSA, PCR: NEGATIVE
STAPHYLOCOCCUS AUREUS: POSITIVE — AB

## 2015-09-27 LAB — APTT: APTT: 31 s (ref 24–36)

## 2015-09-27 LAB — ABO/RH: ABO/RH(D): O NEG

## 2015-09-27 NOTE — Patient Instructions (Signed)
  Your procedure is scheduled on: Tuesday Feb. 14, 2017. Report to Same Day Surgery. To find out your arrival time please call [phone] between 1PM - 3PM on Monday Feb. 13, 2017.  Remember: Instructions that are not followed completely may result in serious medical risk, up to and including death, or upon the discretion of your surgeon and anesthesiologist your surgery may need to be rescheduled.    _x___ 1. Do not eat food or drink liquids after midnight. No gum chewing or  hard candies.     ____ 2. No Alcohol for 24 hours before or after surgery.   ____ 3. Bring all medications with you on the day of surgery if instructed.    __x__ 4. Notify your doctor if there is any change in your medical condition     (cold, fever, infections).     Do not wear jewelry, make-up, hairpins, clips or nail polish.  Do not wear lotions, powders, or perfumes. You may wear deodorant.  Do not shave 48 hours prior to surgery. Men may shave face and neck.  Do not bring valuables to the hospital.    [facility] is not responsible for any belongings or valuables.               Contacts, dentures or bridgework may not be worn into surgery.  Leave your suitcase in the car. After surgery it may be brought to your room.  For patients admitted to the hospital, discharge time is determined by your treatment team.   Patients discharged the day of surgery will not be allowed to drive home.    Please read over the following fact sheets that you were given:   [facility] Preparing for Surgery  __x_ Take these medicines the morning of surgery with A SIP OF WATER:    1. citalopram (CELEXA)  2. letrozole ([place])   3. levothyroxine (SYNTHROID, LEVOTHROID)  4.omeprazole (PRILOSEC)   ____ Fleet Enema (as directed)   _x___ Use CHG Soap as directed  _x__ Use Advair inhalers on the day of surgery  ____ Stop metformin 2 days prior to surgery    ____ Take 1/2 of usual insulin dose the night before surgery  and none on the morning of   surgery.   ____ Stop Coumadin/Plavix/aspirin on does not apply.  _x___ Stop Anti-inflammatories Meloxicam, Aleve, Ibuprofen, Advil, Motrin now.  OK to take Tyelnol or Tyrenol with Hydrocodone.   ____ Stop supplements until after surgery.    ____ Bring C-Pap to the hospital.

## 2015-09-28 LAB — URINE CULTURE: Special Requests: NORMAL

## 2015-09-29 LAB — LATEX, IGE: Latex: <0.1 kU/L

## 2015-09-29 NOTE — Pre-Procedure Instructions (Signed)
Met B results sent to Dr. Rudene Christians and anesthesia for review.   Positive staph aureus and urine culture results sent to Dr. Rudene Christians.  Questioned if want any treatment for positive staph aureus and if want urine culture recollected.

## 2015-09-29 NOTE — Pre-Procedure Instructions (Signed)
Spoke to [place] in the lab regarding Latex results, under managed orders the Latex was discontinued.  Caryl Pina looked under chart review lab tab, latex test is in process.

## 2015-10-03 ENCOUNTER — Encounter: Payer: Self-pay | Admitting: Anesthesiology

## 2015-10-03 ENCOUNTER — Inpatient Hospital Stay: Payer: Medicare Other | Admitting: Anesthesiology

## 2015-10-03 ENCOUNTER — Encounter
Admission: RE | Disposition: A | Discharge status: Skilled Nursing Facility | Payer: Self-pay | Source: Ambulatory Visit | Attending: Orthopedic Surgery

## 2015-10-03 ENCOUNTER — Inpatient Hospital Stay: Payer: Medicare Other

## 2015-10-03 ENCOUNTER — Inpatient Hospital Stay
Admission: RE | Admit: 2015-10-03 | Discharge: 2015-10-06 | DRG: 470 | Disposition: A | Discharge status: Skilled Nursing Facility | Payer: Medicare Other | Source: Ambulatory Visit | Attending: Orthopedic Surgery | Admitting: Orthopedic Surgery

## 2015-10-03 DIAGNOSIS — R52 Pain, unspecified: Secondary | ICD-10-CM

## 2015-10-03 DIAGNOSIS — M87851 Other osteonecrosis, right femur: Secondary | ICD-10-CM | POA: Diagnosis present

## 2015-10-03 DIAGNOSIS — I10 Essential (primary) hypertension: Secondary | ICD-10-CM | POA: Diagnosis present

## 2015-10-03 DIAGNOSIS — Z853 Personal history of malignant neoplasm of breast: Secondary | ICD-10-CM

## 2015-10-03 DIAGNOSIS — J45909 Unspecified asthma, uncomplicated: Secondary | ICD-10-CM | POA: Diagnosis present

## 2015-10-03 DIAGNOSIS — M5010 Cervical disc disorder with radiculopathy, unspecified cervical region: Secondary | ICD-10-CM | POA: Diagnosis present

## 2015-10-03 DIAGNOSIS — M1611 Unilateral primary osteoarthritis, right hip: Secondary | ICD-10-CM | POA: Diagnosis present

## 2015-10-03 DIAGNOSIS — E039 Hypothyroidism, unspecified: Secondary | ICD-10-CM | POA: Diagnosis present

## 2015-10-03 DIAGNOSIS — Z881 Allergy status to other antibiotic agents status: Secondary | ICD-10-CM

## 2015-10-03 DIAGNOSIS — Z8589 Personal history of malignant neoplasm of other organs and systems: Secondary | ICD-10-CM

## 2015-10-03 DIAGNOSIS — K219 Gastro-esophageal reflux disease without esophagitis: Secondary | ICD-10-CM | POA: Diagnosis present

## 2015-10-03 DIAGNOSIS — Z85068 Personal history of other malignant neoplasm of small intestine: Secondary | ICD-10-CM | POA: Diagnosis not present

## 2015-10-03 DIAGNOSIS — Z91040 Latex allergy status: Secondary | ICD-10-CM

## 2015-10-03 DIAGNOSIS — C50911 Malignant neoplasm of unspecified site of right female breast: Secondary | ICD-10-CM

## 2015-10-03 DIAGNOSIS — Z419 Encounter for procedure for purposes other than remedying health state, unspecified: Secondary | ICD-10-CM

## 2015-10-03 DIAGNOSIS — G8918 Other acute postprocedural pain: Secondary | ICD-10-CM

## 2015-10-03 HISTORY — PX: TOTAL HIP ARTHROPLASTY: SHX124

## 2015-10-03 LAB — CBC
HCT: 33.9 % — ABNORMAL LOW (ref 35.0–47.0)
Hemoglobin: 11.6 g/dL — ABNORMAL LOW (ref 12.0–16.0)
MCH: 32.4 pg (ref 26.0–34.0)
MCHC: 34.3 g/dL (ref 32.0–36.0)
MCV: 94.5 fL (ref 80.0–100.0)
PLATELETS: 198 10*3/uL (ref 150–440)
RBC: 3.59 MIL/uL — ABNORMAL LOW (ref 3.80–5.20)
RDW: 13.9 % (ref 11.5–14.5)
WBC: 7.8 10*3/uL (ref 3.6–11.0)

## 2015-10-03 LAB — CREATININE, SERUM
Creatinine, Ser: 0.86 mg/dL (ref 0.44–1.00)
GFR calc non Af Amer: >60 mL/min (ref >60)

## 2015-10-03 SURGERY — ARTHROPLASTY, HIP, TOTAL, ANTERIOR APPROACH
Anesthesia: Spinal | Laterality: Right | Wound class: Clean

## 2015-10-03 MED ORDER — NEOMYCIN-POLYMYXIN B GU 40-200000 IR SOLN
Status: DC | PRN
  Administered 2015-10-03: 4 mL

## 2015-10-03 MED ORDER — MOMETASONE FURO-FORMOTEROL FUM 100-5 MCG/ACT IN AERO
2.0000 | INHALATION_SPRAY | Freq: Two times a day (BID) | RESPIRATORY_TRACT | Status: DC
  Administered 2015-10-03 – 2015-10-06 (×6): 2 via RESPIRATORY_TRACT
  Filled 2015-10-03: qty 8.8

## 2015-10-03 MED ORDER — LETROZOLE 2.5 MG PO TABS
2.5000 mg | ORAL_TABLET | Freq: Every day | ORAL | Status: DC
  Administered 2015-10-04 – 2015-10-06 (×3): 2.5 mg via ORAL
  Filled 2015-10-03 (×4): qty 1

## 2015-10-03 MED ORDER — METOCLOPRAMIDE HCL 5 MG PO TABS: 5.0000 mg | ORAL_TABLET | Freq: Three times a day (TID) | ORAL | Status: DC | PRN

## 2015-10-03 MED ORDER — ZOLPIDEM TARTRATE 5 MG PO TABS
10.0000 mg | ORAL_TABLET | Freq: Every evening | ORAL | Status: DC | PRN
  Administered 2015-10-03: 10 mg via ORAL
  Filled 2015-10-03: qty 2

## 2015-10-03 MED ORDER — LIDOCAINE HCL (CARDIAC) 20 MG/ML IV SOLN
INTRAVENOUS | Status: DC | PRN
  Administered 2015-10-03: 80 mg via INTRAVENOUS

## 2015-10-03 MED ORDER — LORATADINE 10 MG PO TABS
10.0000 mg | ORAL_TABLET | Freq: Every day | ORAL | Status: DC
  Administered 2015-10-03 – 2015-10-06 (×4): 10 mg via ORAL
  Filled 2015-10-03 (×4): qty 1

## 2015-10-03 MED ORDER — IRBESARTAN 75 MG PO TABS
75.0000 mg | ORAL_TABLET | Freq: Every day | ORAL | Status: DC
  Administered 2015-10-03: 75 mg via ORAL
  Filled 2015-10-03 (×3): qty 1

## 2015-10-03 MED ORDER — BUPIVACAINE HCL (PF) 0.5 % IJ SOLN
INTRAMUSCULAR | Status: DC | PRN
  Administered 2015-10-03: 3 mL

## 2015-10-03 MED ORDER — METHOCARBAMOL 1000 MG/10ML IJ SOLN
500.0000 mg | Freq: Four times a day (QID) | INTRAVENOUS | Status: DC | PRN
  Filled 2015-10-03: qty 5

## 2015-10-03 MED ORDER — FENTANYL CITRATE (PF) 100 MCG/2ML IJ SOLN: 25.0000 ug | INTRAMUSCULAR | Status: DC | PRN

## 2015-10-03 MED ORDER — CEFAZOLIN SODIUM-DEXTROSE 2-3 GM-% IV SOLR
INTRAVENOUS | Status: AC
  Filled 2015-10-03: qty 50

## 2015-10-03 MED ORDER — HYDROCHLOROTHIAZIDE 12.5 MG PO CAPS
12.5000 mg | ORAL_CAPSULE | Freq: Every day | ORAL | Status: DC
  Administered 2015-10-03: 12.5 mg via ORAL
  Filled 2015-10-03 (×2): qty 1

## 2015-10-03 MED ORDER — VALSARTAN-HYDROCHLOROTHIAZIDE 80-12.5 MG PO TABS: 1.0000 | ORAL_TABLET | ORAL | Status: DC

## 2015-10-03 MED ORDER — PRAVASTATIN SODIUM 20 MG PO TABS
40.0000 mg | ORAL_TABLET | Freq: Every day | ORAL | Status: DC
Start: 2015-10-03 — End: 2015-10-06
  Administered 2015-10-03 – 2015-10-05 (×3): 40 mg via ORAL
  Filled 2015-10-03 (×3): qty 2

## 2015-10-03 MED ORDER — MIDAZOLAM HCL 5 MG/5ML IJ SOLN
INTRAMUSCULAR | Status: DC | PRN
  Administered 2015-10-03 (×2): 1 mg via INTRAVENOUS

## 2015-10-03 MED ORDER — CITALOPRAM HYDROBROMIDE 20 MG PO TABS
20.0000 mg | ORAL_TABLET | ORAL | Status: DC
  Administered 2015-10-04 – 2015-10-06 (×3): 20 mg via ORAL
  Filled 2015-10-03 (×4): qty 1

## 2015-10-03 MED ORDER — MORPHINE SULFATE (PF) 2 MG/ML IV SOLN
2.0000 mg | INTRAVENOUS | Status: DC | PRN
  Administered 2015-10-03 – 2015-10-04 (×4): 2 mg via INTRAVENOUS
  Filled 2015-10-03 (×4): qty 1

## 2015-10-03 MED ORDER — ONDANSETRON HCL 4 MG/2ML IJ SOLN: 4.0000 mg | Freq: Four times a day (QID) | INTRAMUSCULAR | Status: DC | PRN

## 2015-10-03 MED ORDER — CEFAZOLIN SODIUM-DEXTROSE 2-3 GM-% IV SOLR
2.0000 g | Freq: Four times a day (QID) | INTRAVENOUS | Status: AC
  Administered 2015-10-03 – 2015-10-04 (×2): 2 g via INTRAVENOUS
  Filled 2015-10-03 (×3): qty 50

## 2015-10-03 MED ORDER — MAGNESIUM HYDROXIDE 400 MG/5ML PO SUSP
30.0000 mL | Freq: Every day | ORAL | Status: DC | PRN
  Administered 2015-10-04 – 2015-10-05 (×2): 30 mL via ORAL
  Filled 2015-10-03 (×2): qty 30

## 2015-10-03 MED ORDER — VANCOMYCIN HCL 500 MG IV SOLR
500.0000 mg | Freq: Once | INTRAVENOUS | Status: AC
  Administered 2015-10-03: 500 mg via INTRAVENOUS
  Filled 2015-10-03: qty 500

## 2015-10-03 MED ORDER — ONDANSETRON HCL 4 MG/2ML IJ SOLN: 4.0000 mg | Freq: Once | INTRAMUSCULAR | Status: DC | PRN

## 2015-10-03 MED ORDER — ACETAMINOPHEN 650 MG RE SUPP
650.0000 mg | Freq: Four times a day (QID) | RECTAL | Status: DC | PRN
Start: 2015-10-03 — End: 2015-10-06

## 2015-10-03 MED ORDER — DEXAMETHASONE SODIUM PHOSPHATE 10 MG/ML IJ SOLN
INTRAMUSCULAR | Status: DC | PRN
  Administered 2015-10-03: 5 mg via INTRAVENOUS

## 2015-10-03 MED ORDER — ONDANSETRON HCL 4 MG PO TABS: 4.0000 mg | ORAL_TABLET | Freq: Four times a day (QID) | ORAL | Status: DC | PRN

## 2015-10-03 MED ORDER — LEVOTHYROXINE SODIUM 100 MCG PO TABS
100.0000 ug | ORAL_TABLET | Freq: Every day | ORAL | Status: DC
  Administered 2015-10-04 – 2015-10-06 (×2): 100 ug via ORAL
  Filled 2015-10-03 (×2): qty 1

## 2015-10-03 MED ORDER — MENTHOL 3 MG MT LOZG
1.0000 | LOZENGE | OROMUCOSAL | Status: DC | PRN
  Filled 2015-10-03: qty 9

## 2015-10-03 MED ORDER — CEFAZOLIN SODIUM-DEXTROSE 2-3 GM-% IV SOLR
2.0000 g | Freq: Once | INTRAVENOUS | Status: AC
  Administered 2015-10-03: 2 g via INTRAVENOUS

## 2015-10-03 MED ORDER — EPHEDRINE SULFATE 50 MG/ML IJ SOLN
INTRAMUSCULAR | Status: DC | PRN
  Administered 2015-10-03 (×3): 10 mg via INTRAVENOUS

## 2015-10-03 MED ORDER — MONTELUKAST SODIUM 10 MG PO TABS
10.0000 mg | ORAL_TABLET | Freq: Every day | ORAL | Status: DC
  Administered 2015-10-03 – 2015-10-05 (×3): 10 mg via ORAL
  Filled 2015-10-03 (×3): qty 1

## 2015-10-03 MED ORDER — BISACODYL 5 MG PO TBEC
5.0000 mg | DELAYED_RELEASE_TABLET | Freq: Every day | ORAL | Status: DC | PRN
  Administered 2015-10-05: 5 mg via ORAL
  Filled 2015-10-03: qty 1

## 2015-10-03 MED ORDER — SODIUM CHLORIDE 0.9 % IV SOLN
INTRAVENOUS | Status: DC
  Administered 2015-10-03 – 2015-10-04 (×2): via INTRAVENOUS

## 2015-10-03 MED ORDER — FENTANYL CITRATE (PF) 100 MCG/2ML IJ SOLN
INTRAMUSCULAR | Status: DC | PRN
  Administered 2015-10-03 (×2): 50 ug via INTRAVENOUS

## 2015-10-03 MED ORDER — BUPIVACAINE-EPINEPHRINE (PF) 0.25% -1:200000 IJ SOLN
INTRAMUSCULAR | Status: AC
  Filled 2015-10-03: qty 30

## 2015-10-03 MED ORDER — METHOCARBAMOL 500 MG PO TABS
500.0000 mg | ORAL_TABLET | Freq: Four times a day (QID) | ORAL | Status: DC | PRN
  Administered 2015-10-05: 500 mg via ORAL
  Filled 2015-10-03: qty 1

## 2015-10-03 MED ORDER — NICOTINE 14 MG/24HR TD PT24
14.0000 mg | MEDICATED_PATCH | Freq: Every day | TRANSDERMAL | Status: DC
  Administered 2015-10-03 – 2015-10-06 (×4): 14 mg via TRANSDERMAL
  Filled 2015-10-03 (×4): qty 1

## 2015-10-03 MED ORDER — PHENOL 1.4 % MT LIQD
1.0000 | OROMUCOSAL | Status: DC | PRN
Start: 2015-10-03 — End: 2015-10-06
  Filled 2015-10-03: qty 177

## 2015-10-03 MED ORDER — ENOXAPARIN SODIUM 40 MG/0.4ML ~~LOC~~ SOLN
40.0000 mg | SUBCUTANEOUS | Status: DC
  Administered 2015-10-04 – 2015-10-06 (×3): 40 mg via SUBCUTANEOUS
  Filled 2015-10-03 (×3): qty 0.4

## 2015-10-03 MED ORDER — PROPOFOL 500 MG/50ML IV EMUL
INTRAVENOUS | Status: DC | PRN
  Administered 2015-10-03: 80 ug/kg/min via INTRAVENOUS

## 2015-10-03 MED ORDER — DOCUSATE CALCIUM 240 MG PO CAPS: 240.0000 mg | ORAL_CAPSULE | Freq: Every day | ORAL | Status: DC

## 2015-10-03 MED ORDER — ONDANSETRON HCL 4 MG/2ML IJ SOLN
INTRAMUSCULAR | Status: DC | PRN
  Administered 2015-10-03: 4 mg via INTRAVENOUS

## 2015-10-03 MED ORDER — BUPIVACAINE-EPINEPHRINE 0.25% -1:200000 IJ SOLN
INTRAMUSCULAR | Status: DC | PRN
  Administered 2015-10-03: 28 mL

## 2015-10-03 MED ORDER — LACTATED RINGERS IV SOLN
INTRAVENOUS | Status: DC
  Administered 2015-10-03: 09:00:00 via INTRAVENOUS

## 2015-10-03 MED ORDER — ACETAMINOPHEN 325 MG PO TABS
650.0000 mg | ORAL_TABLET | Freq: Four times a day (QID) | ORAL | Status: DC | PRN
Start: 2015-10-03 — End: 2015-10-06
  Administered 2015-10-05: 650 mg via ORAL
  Filled 2015-10-03: qty 2

## 2015-10-03 MED ORDER — MAGNESIUM CITRATE PO SOLN
1.0000 | Freq: Once | ORAL | Status: DC | PRN
  Filled 2015-10-03: qty 296

## 2015-10-03 MED ORDER — OXYCODONE HCL 5 MG PO TABS
5.0000 mg | ORAL_TABLET | ORAL | Status: DC | PRN
  Administered 2015-10-03: 5 mg via ORAL
  Administered 2015-10-03: 10 mg via ORAL
  Administered 2015-10-03 (×2): 5 mg via ORAL
  Administered 2015-10-04 – 2015-10-05 (×10): 10 mg via ORAL
  Administered 2015-10-06: 5 mg via ORAL
  Administered 2015-10-06: 10 mg via ORAL
  Administered 2015-10-06: 5 mg via ORAL
  Filled 2015-10-03: qty 2
  Filled 2015-10-03: qty 1
  Filled 2015-10-03 (×9): qty 2
  Filled 2015-10-03 (×3): qty 1
  Filled 2015-10-03 (×3): qty 2

## 2015-10-03 MED ORDER — ADULT MULTIVITAMIN W/MINERALS CH
1.0000 | ORAL_TABLET | ORAL | Status: DC
  Administered 2015-10-04 – 2015-10-06 (×3): 1 via ORAL
  Filled 2015-10-03 (×4): qty 1

## 2015-10-03 MED ORDER — PHENYLEPHRINE HCL 10 MG/ML IJ SOLN
INTRAMUSCULAR | Status: DC | PRN
  Administered 2015-10-03 (×2): 100 ug via INTRAVENOUS

## 2015-10-03 MED ORDER — DOCUSATE SODIUM 100 MG PO CAPS
100.0000 mg | ORAL_CAPSULE | Freq: Two times a day (BID) | ORAL | Status: DC
  Administered 2015-10-03 – 2015-10-06 (×6): 100 mg via ORAL
  Filled 2015-10-03 (×6): qty 1

## 2015-10-03 MED ORDER — METOCLOPRAMIDE HCL 5 MG/ML IJ SOLN: 5.0000 mg | Freq: Three times a day (TID) | INTRAMUSCULAR | Status: DC | PRN

## 2015-10-03 MED ORDER — PANTOPRAZOLE SODIUM 40 MG PO TBEC
40.0000 mg | DELAYED_RELEASE_TABLET | Freq: Every day | ORAL | Status: DC
  Administered 2015-10-04 – 2015-10-06 (×3): 40 mg via ORAL
  Filled 2015-10-03 (×3): qty 1

## 2015-10-03 SURGICAL SUPPLY — 46 items
BLADE SAW SAG 18.5X105 (BLADE) ×3 IMPLANT
BNDG COHESIVE 6X5 TAN STRL LF (GAUZE/BANDAGES/DRESSINGS) ×6 IMPLANT
CANISTER SUCT 1200ML W/VALVE (MISCELLANEOUS) ×3 IMPLANT
CAPT HIP TOTAL 3 ×3 IMPLANT
CATH FOL LEG HOLDER (MISCELLANEOUS) ×3 IMPLANT
CATH TRAY METER 16FR LF (MISCELLANEOUS) ×3 IMPLANT
CHLORAPREP W/TINT 26ML (MISCELLANEOUS) ×3 IMPLANT
DRAPE C-ARM XRAY 36X54 (DRAPES) ×3 IMPLANT
DRAPE C-SECTION (MISCELLANEOUS) IMPLANT
DRAPE INCISE IOBAN 66X60 STRL (DRAPES) IMPLANT
DRAPE POUCH INSTRU U-SHP 10X18 (DRAPES) ×3 IMPLANT
DRAPE SHEET LG 3/4 BI-LAMINATE (DRAPES) ×9 IMPLANT
DRAPE STERI IOBAN 125X83 (DRAPES) IMPLANT
DRAPE TABLE BACK 80X90 (DRAPES) ×3 IMPLANT
DRSG OPSITE POSTOP 4X8 (GAUZE/BANDAGES/DRESSINGS) ×6 IMPLANT
ELECT BLADE 6.5 EXT (BLADE) ×3 IMPLANT
GAUZE SPONGE 4X4 12PLY STRL (GAUZE/BANDAGES/DRESSINGS) ×3 IMPLANT
GLOVE BIOGEL PI IND STRL 9 (GLOVE) ×1 IMPLANT
GLOVE BIOGEL PI INDICATOR 9 (GLOVE) ×2
GLOVE SURG ORTHO 9.0 STRL STRW (GLOVE) ×3 IMPLANT
GOWN SPECIALTY ULTRA XL (MISCELLANEOUS) ×3 IMPLANT
GOWN STRL REUS W/ TWL LRG LVL3 (GOWN DISPOSABLE) ×1 IMPLANT
GOWN STRL REUS W/TWL LRG LVL3 (GOWN DISPOSABLE) ×2
HEMOVAC 400CC 10FR (MISCELLANEOUS) IMPLANT
HOOD PEEL AWAY FLYTE STAYCOOL (MISCELLANEOUS) ×3 IMPLANT
MAT BLUE FLOOR 46X72 FLO (MISCELLANEOUS) ×3 IMPLANT
NDL SAFETY 18GX1.5 (NEEDLE) ×3 IMPLANT
NEEDLE SPNL 18GX3.5 QUINCKE PK (NEEDLE) ×3 IMPLANT
NS IRRIG 1000ML POUR BTL (IV SOLUTION) ×3 IMPLANT
PACK HIP COMPR (MISCELLANEOUS) ×3 IMPLANT
SOL PREP PVP 2OZ (MISCELLANEOUS) ×3
SOLUTION PREP PVP 2OZ (MISCELLANEOUS) ×1 IMPLANT
STAPLER SKIN PROX 35W (STAPLE) ×3 IMPLANT
STRAP SAFETY BODY (MISCELLANEOUS) ×3 IMPLANT
SUT DVC 2 QUILL PDO  T11 36X36 (SUTURE) ×2
SUT DVC 2 QUILL PDO T11 36X36 (SUTURE) ×1 IMPLANT
SUT DVC QUILL MONODERM 30X30 (SUTURE) ×3 IMPLANT
SUT ETHIBOND NAB CT1 #1 30IN (SUTURE) IMPLANT
SUT SILK 0 (SUTURE) ×2
SUT SILK 0 30XBRD TIE 6 (SUTURE) ×1 IMPLANT
SUT VIC AB 1 CT1 36 (SUTURE) ×3 IMPLANT
SYR 20CC LL (SYRINGE) ×3 IMPLANT
SYR 30ML LL (SYRINGE) ×3 IMPLANT
TAPE MICROFOAM 4IN (TAPE) ×3 IMPLANT
TUBE KAMVAC SUCTION (TUBING) IMPLANT
WATER STERILE IRR 1000ML POUR (IV SOLUTION) ×3 IMPLANT

## 2015-10-03 NOTE — H&P (Signed)
Reviewed paper H+P, will be scanned into chart. No changes noted.  

## 2015-10-03 NOTE — NC FL2 (Signed)
[place] LEVEL OF CARE SCREENING TOOL     IDENTIFICATION  Patient Name: Sara David Birthdate: 1945-12-23 Sex: female Admission Date (Current Location): 10/03/2015  [place] and [place] Number:  Engineering geologist and Address:  [facility], [address] [place], [place] [address]      Provider Number: [id number]  Attending Physician Name and Address:  Hessie Knows, MD  Relative Name and Phone Number:       Current Level of Care: Hospital Recommended Level of Care: [place] Prior Approval Number:    Date Approved/Denied:   PASRR Number:  ([id number])  Discharge Plan: SNF    Current Diagnoses: Patient Active Problem List   Diagnosis Date Noted  . Primary osteoarthritis of right hip 10/03/2015  . Small bowel obstruction ([place]) 01/30/2015  . Back pain 01/30/2015  . B12 deficiency 01/30/2015  . Gastroesophageal reflux disease 01/30/2015  . IBS (irritable bowel syndrome) 01/30/2015  . Asthma 01/30/2015  . Hypothyroidism 01/30/2015  . FH: cholecystectomy 01/30/2015  . Breast cancer ([place]) 01/30/2015   Primary osteoarthritis of right hip 05/11/2015  Raynaud's phenomenon 05/30/2014  Postmenopausal 05/30/2014  Fibrocystic breast disease 05/30/2014  Chronic bronchitis (CMS-HCC) 01/26/2014  Carpal tunnel syndrome   Hyperlipemia, mixed   Cervicalgia   Essential hypertension, benign   Breast cancer (CMS-HCC)   Overview:   Stage IV, followed by Dr. Grayland Ormond on Letrozole.    Cervical disc disease   Asthma without status asthmaticus   Irritable bowel syndrome   Hypothyroidism, unspecified   GERD (gastroesophageal reflux disease)      Orientation RESPIRATION BLADDER Height & Weight     Self, Time, Situation, Place  Normal Continent Weight: 144 lb 15.9 oz (65.77 kg) Height:  5\' 4"  (162.6 cm)  BEHAVIORAL SYMPTOMS/MOOD NEUROLOGICAL BOWEL NUTRITION STATUS   (none )  (none ) Continent Diet (Diet: Clear  Liquid )  AMBULATORY STATUS COMMUNICATION OF NEEDS Skin   Extensive Assist Verbally Surgical wounds (Incision: Right Hip. )                       Personal Care Assistance Level of Assistance  Bathing, Feeding, Dressing Bathing Assistance: Limited assistance Feeding assistance: Independent Dressing Assistance: Limited assistance     Functional Limitations Info  Sight, Hearing, Speech Sight Info: Adequate Hearing Info: Adequate Speech Info: Adequate    SPECIAL CARE FACTORS FREQUENCY  PT (By licensed PT), OT (By licensed OT)     PT Frequency:  (5) OT Frequency:  (5)            Contractures      Additional Factors Info  Code Status, Allergies Code Status Info:  (Full Code. ) Allergies Info:  (Diclofenac Sodium, Erythromycin, Tape, Wellbutrin Bupropion)           Current Medications (10/03/2015):  This is the current hospital active medication list Current Facility-Administered Medications  Medication Dose Route Frequency Provider Last Rate Last Dose  . 0.9 %  sodium chloride infusion   Intravenous Continuous Hessie Knows, MD 75 mL/hr at 10/03/15 1536    . acetaminophen (TYLENOL) tablet 650 mg  650 mg Oral Q6H PRN Hessie Knows, MD       Or  . acetaminophen (TYLENOL) suppository 650 mg  650 mg Rectal Q6H PRN Hessie Knows, MD      . bisacodyl (DULCOLAX) EC tablet 5 mg  5 mg Oral Daily PRN Hessie Knows, MD      . ceFAZolin (ANCEF) 2-3  GM-% IVPB SOLR           . ceFAZolin (ANCEF) IVPB 2 g/50 mL premix  2 g Intravenous Q6H Hessie Knows, MD      . Derrill Memo ON 10/04/2015] citalopram (CELEXA) tablet 20 mg  20 mg Oral Tedra Senegal, MD      . docusate sodium (COLACE) capsule 100 mg  100 mg Oral BID Hessie Knows, MD      . Derrill Memo ON 10/04/2015] enoxaparin (LOVENOX) injection 40 mg  40 mg Subcutaneous Q24H Hessie Knows, MD      . irbesartan (AVAPRO) tablet 75 mg  75 mg Oral Daily Hessie Knows, MD       And  . hydrochlorothiazide (MICROZIDE) capsule 12.5 mg  12.5 mg Oral  Daily Hessie Knows, MD      . Derrill Memo ON 10/04/2015] letrozole [facility]) tablet 2.5 mg  2.5 mg Oral Daily Hessie Knows, MD      . Derrill Memo ON 10/04/2015] levothyroxine (SYNTHROID, LEVOTHROID) tablet 100 mcg  100 mcg Oral QAC breakfast Hessie Knows, MD      . loratadine (CLARITIN) tablet 10 mg  10 mg Oral Daily Hessie Knows, MD      . magnesium citrate solution 1 Bottle  1 Bottle Oral Once PRN Hessie Knows, MD      . magnesium hydroxide (MILK OF MAGNESIA) suspension 30 mL  30 mL Oral Daily PRN Hessie Knows, MD      . menthol-cetylpyridinium (CEPACOL) lozenge 3 mg  1 lozenge Oral PRN Hessie Knows, MD       Or  . phenol (CHLORASEPTIC) mouth spray 1 spray  1 spray Mouth/Throat PRN Hessie Knows, MD      . methocarbamol (ROBAXIN) tablet 500 mg  500 mg Oral Q6H PRN Hessie Knows, MD       Or  . methocarbamol (ROBAXIN) 500 mg in dextrose 5 % 50 mL IVPB  500 mg Intravenous Q6H PRN Hessie Knows, MD      . metoCLOPramide (REGLAN) tablet 5-10 mg  5-10 mg Oral Q8H PRN Hessie Knows, MD       Or  . metoCLOPramide (REGLAN) injection 5-10 mg  5-10 mg Intravenous Q8H PRN Hessie Knows, MD      . mometasone-formoterol [facility]) 100-5 MCG/ACT inhaler 2 puff  2 puff Inhalation BID Hessie Knows, MD      . montelukast (SINGULAIR) tablet 10 mg  10 mg Oral QHS Hessie Knows, MD      . morphine 2 MG/ML injection 2 mg  2 mg Intravenous Q1H PRN Hessie Knows, MD   2 mg at 10/03/15 1537  . [START ON 10/04/2015] multivitamin with minerals tablet 1 tablet  1 tablet Oral Tedra Senegal, MD      . nicotine (NICODERM CQ - dosed in mg/24 hours) patch 14 mg  14 mg Transdermal Daily Hessie Knows, MD      . ondansetron [facility]) tablet 4 mg  4 mg Oral Q6H PRN Hessie Knows, MD       Or  . ondansetron [facility]) injection 4 mg  4 mg Intravenous Q6H PRN Hessie Knows, MD      . oxyCODONE (Oxy IR/ROXICODONE) immediate release tablet 5-10 mg  5-10 mg Oral Q3H PRN Hessie Knows, MD   5 mg at 10/03/15 1436  . [START ON 10/04/2015] pantoprazole  (PROTONIX) EC tablet 40 mg  40 mg Oral Daily Hessie Knows, MD      . pravastatin (PRAVACHOL) tablet 40 mg  40 mg Oral QHS Hessie Knows,  MD      . zolpidem (AMBIEN) tablet 10 mg  10 mg Oral QHS PRN Hessie Knows, MD         Discharge Medications: Please see discharge summary for a list of discharge medications.  Relevant Imaging Results:  Relevant Lab Results:   Additional Information  (SSN: [id number])  Loralyn Freshwater, LCSW

## 2015-10-03 NOTE — Transfer of Care (Signed)
Immediate Anesthesia Transfer of Care Note  Patient: Sara David  Procedure(s) Performed: Procedure(s): TOTAL HIP ARTHROPLASTY ANTERIOR APPROACH (Right)  Patient Location: PACU  Anesthesia Type: spinal   Level of Consciousness: awake, alert  and oriented  Airway & Oxygen Therapy: Patient Spontanous Breathing and Patient connected to face mask oxygen  Post-op Assessment: Report given to RN and Post -op Vital signs reviewed and stable  Post vital signs: Reviewed and stable  Last Vitals:  Filed Vitals:   10/03/15 0852  BP: 115/46  Pulse: 66  Temp: 36.8 C  Resp: 16    Complications: No apparent anesthesia complications

## 2015-10-03 NOTE — OR Nursing (Signed)
Sacral pad sent to OR 

## 2015-10-03 NOTE — Anesthesia Procedure Notes (Signed)
Spinal Patient location during procedure: OR Staffing Anesthesiologist: Gunnar Bulla Performed by: anesthesiologist  Preanesthetic Checklist Completed: patient identified, site marked, surgical consent, pre-op evaluation, timeout performed, IV checked and risks and benefits discussed Spinal Block Patient position: sitting Prep: Betadine Patient monitoring: heart rate, cardiac monitor, continuous pulse ox and blood pressure Approach: midline Location: L3-4 Injection technique: single-shot Needle Needle type: Pencil-Tip  Needle gauge: 25 G Needle length: 9 cm Assessment Sensory level: T10 Additional Notes 1030 0.5% marcaine 3 ml

## 2015-10-03 NOTE — Anesthesia Preprocedure Evaluation (Addendum)
Anesthesia Evaluation  Patient identified by MRN, date of birth, ID band Patient awake    Reviewed: Allergy & Precautions, NPO status , Patient's Chart, lab work & pertinent test results, reviewed documented beta blocker date and time   History of Anesthesia Complications (+) Family history of anesthesia reaction  Airway Mallampati: II  TM Distance: >3 FB     Dental  (+) Chipped   Pulmonary asthma , Current Smoker,           Cardiovascular hypertension, Pt. on medications      Neuro/Psych Anxiety    GI/Hepatic GERD  Controlled,  Endo/Other  Hypothyroidism   Renal/GU      Musculoskeletal   Abdominal   Peds  Hematology   Anesthesia Other Findings Does have mets in lumbar area. Lumbar Lam done in the past. I still think it is worth trying a spinal. If not Genl anesthesia. She does have mets to the bone and the hip.  Reproductive/Obstetrics                            Anesthesia Physical Anesthesia Plan  ASA: III  Anesthesia Plan: Spinal   Post-op Pain Management:    Induction:   Airway Management Planned:   Additional Equipment:   Intra-op Plan:   Post-operative Plan:   Informed Consent: I have reviewed the patients History and Physical, chart, labs and discussed the procedure including the risks, benefits and alternatives for the proposed anesthesia with the patient or authorized representative who has indicated his/her understanding and acceptance.     Plan Discussed with: CRNA  Anesthesia Plan Comments:         Anesthesia Quick Evaluation

## 2015-10-03 NOTE — Evaluation (Signed)
Physical Therapy Evaluation Patient Details Name: Sara David MRN: [id number] DOB: 14-Feb-1946 Today's Date: 10/03/2015   History of Present Illness  Pt admitted for R THR.   Clinical Impression  Pt is a pleasant 70 year old female who was admitted for R THR. Pt performs bed mobility with mod assist and transfers/ambulation with cga and rw. Pt very limited by pain. Pt motivated to perform therapy. Pt demonstrates deficits with pain/mobility/strength. Would benefit from skilled PT to address above deficits and promote optimal return to PLOF.      Follow Up Recommendations Home health PT    Equipment Recommendations  Rolling walker with 5" wheels    Recommendations for Other Services       Precautions / Restrictions Precautions Precautions: Anterior Hip;Fall Precaution Booklet Issued: No Restrictions Weight Bearing Restrictions: Yes RLE Weight Bearing: Weight bearing as tolerated      Mobility  Bed Mobility Overal bed mobility: Needs Assistance Bed Mobility: Supine to Sit     Supine to sit: Mod assist     General bed mobility comments: assist for scooting R LE off bed and scooting towards EOB. Once seated, pt able to sit with supervision.  Transfers Overall transfer level: Needs assistance Equipment used: Rolling walker (2 wheeled) Transfers: Sit to/from Stand Sit to Stand: Min guard         General transfer comment: safe technique performed with rw and pt states she feels better standing than sitting. CUes given for pushing from bed  Ambulation/Gait Ambulation/Gait assistance: Min guard Ambulation Distance (Feet): 5 Feet Assistive device: Rolling walker (2 wheeled) Gait Pattern/deviations: Step-to pattern     General Gait Details: slow antalgic gait pattern performed. Pt with forward flexed posture, however safe technique with no LOB. Step to gait pattern noted.  Pt refuses further ambulation distance secondary to pain  Stairs            Wheelchair  Mobility    Modified Rankin (Stroke Patients Only)       Balance Overall balance assessment: Needs assistance Sitting-balance support: Feet supported Sitting balance-Leahy Scale: Normal     Standing balance support: Bilateral upper extremity supported Standing balance-Leahy Scale: Good                               Pertinent Vitals/Pain Pain Assessment: 0-10 Pain Score: 8  Pain Location: R ant. thigh Pain Descriptors / Indicators: Constant;Operative site guarding Pain Intervention(s): Limited activity within patient's tolerance;Patient requesting pain meds-RN notified;Repositioned;Ice applied    Home Living Family/patient expects to be discharged to:: Private residence Living Arrangements: Spouse/significant other Available Help at Discharge: Family Type of Home: House Home Access: Stairs to enter Entrance Stairs-Rails: None Entrance Stairs-Number of Steps: 2 Home Layout: One level Home Equipment: Cane - single point;Toilet riser      Prior Function Level of Independence: Independent with assistive device(s)         Comments: uses SPC     Hand Dominance        Extremity/Trunk Assessment   Upper Extremity Assessment: Overall WFL for tasks assessed           Lower Extremity Assessment:  (R LE grossly 3+/5; L LE grossly 5/5)         Communication   Communication: No difficulties  Cognition Arousal/Alertness: Awake/alert Behavior During Therapy: WFL for tasks assessed/performed Overall Cognitive Status: Within Functional Limits for tasks assessed  General Comments      Exercises Other Exercises Other Exercises: Pt performed supine ther-ex including R LE ankle pumps, quad sets, SLRs, glut squeezes, and hip abd/add x 10 reps with min assist and cues for correct technique.      Assessment/Plan    PT Assessment Patient needs continued PT services  PT Diagnosis Difficulty walking;Abnormality of  gait;Generalized weakness;Acute pain   PT Problem List Decreased strength;Decreased range of motion;Decreased activity tolerance;Decreased balance;Decreased mobility;Pain  PT Treatment Interventions DME instruction;Gait training;Stair training;Therapeutic exercise   PT Goals (Current goals can be found in the Care Plan section) Acute Rehab PT Goals Patient Stated Goal: to get stronger PT Goal Formulation: With patient Time For Goal Achievement: 10/17/15 Potential to Achieve Goals: Good    Frequency BID   Barriers to discharge        Co-evaluation               End of Session Equipment Utilized During Treatment: Gait belt Activity Tolerance: Patient tolerated treatment well Patient left: in chair;with chair alarm set Nurse Communication: Mobility status         Time: [id number] PT Time Calculation (min) (ACUTE ONLY): 27 min   Charges:   PT Evaluation $PT Eval Moderate Complexity: 1 Procedure PT Treatments $Therapeutic Exercise: 8-22 mins   PT G Codes:        Athira Janowicz October 24, 2015, 5:30 PM  Greggory Stallion, PT, DPT [phone]

## 2015-10-03 NOTE — Op Note (Signed)
10/03/2015  12:17 PM  PATIENT:  Sara David  70 y.o. female  PRE-OPERATIVE DIAGNOSIS:  ACUTE HIP PAIN RIGHT, right hip osteoarthritis possible avascular necrosis  POST-OPERATIVE DIAGNOSIS:  ACUTE HIP PAIN RIGHT same  PROCEDURE:  Procedure(s): TOTAL HIP ARTHROPLASTY ANTERIOR APPROACH (Right)  SURGEON: Laurene Footman, MD  ASSISTANTS: None  ANESTHESIA:   spinal  EBL:  Total I/O In: 1000 [I.V.:1000] Out: 450 [Urine:150; Blood:300]  BLOOD ADMINISTERED:none  DRAINS: (Medium) Hemovact drain(s) in the Subcutaneous tissue with  Suction Open   LOCAL MEDICATIONS USED:  MARCAINE     SPECIMEN:  Source of Specimen:  Femoral head right  DISPOSITION OF SPECIMEN:  PATHOLOGY  COUNTS:  YES  TOURNIQUET:  * No tourniquets in log *  IMPLANTS: Medacta AMIS standard 2 stem with 52 millimeter Mpact cup DM with liner and S 28 mm head  DICTATION: .Dragon Dictation   The patient was brought to the operating room and after spinal anesthesia was obtained patient was placed on the operative table with the ipsilateral foot into the Medacta attachment, contralateral leg on a well-padded table. C-arm was brought in and preop template x-ray taken. After prepping and draping in usual sterile fashion appropriate patient identification and timeout procedures were completed. Anterior approach to the hip was obtained and centered over the greater trochanter and TFL muscle. The subcutaneous tissue was incised hemostasis being achieved by electrocautery. TFL fascia was incised and the muscle retracted laterally deep retractor placed. The lateral femoral circumflex vessels were identified and ligated. The anterior capsule was exposed and a capsulotomy performed. The neck was identified and a femoral neck cut carried out with a saw. The head was removed without difficulty and showed sclerotic femoral head and acetabulum. Reaming was carried out to 50 mm and a 52 mm cup trial gave appropriate tightness to the acetabular  component a 52 Mpact DM cup was impacted into position. The leg was then externally rotated and ischiofemoral and pubofemoral releases carried out. The femur was sequentially broached to a size 2, size 2 stem and S head trials were placed and the final components chosen. The 2 stem was inserted along with a S 28 mm head and 52 mm liner. The hip was reduced and was stable the wound was thoroughly irrigated with a dilute Betadine solution. The deep fascia was closed using a heavy Quill after infiltration of 30 cc of quarter percent Sensorcaine with epinephrine. Subcutaneous drains were then inserted 2-0Quill to close the skin with skin staples Xeroform 4 x 4's ABDs and tape patient was sent to recovery in stable condition  PLAN OF CARE: Admit to inpatient

## 2015-10-04 ENCOUNTER — Encounter: Payer: Self-pay | Admitting: Orthopedic Surgery

## 2015-10-04 NOTE — Clinical Social Work Note (Signed)
Clinical Social Work Assessment  Patient Details  Name: Sara David MRN: [id number] Date of Birth: 16-Apr-1946  Date of referral:  10/04/15               Reason for consult:  Facility Placement                Permission sought to share information with:  Chartered certified accountant granted to share information::  Yes, Verbal Permission Granted  Name::      [place]::   [place]   Relationship::     Contact Information:     Housing/Transportation Living arrangements for the past 2 months:  [place] of Information:  Patient Patient Interpreter Needed:  None Criminal Activity/Legal Involvement Pertinent to Current Situation/Hospitalization:  No - Comment as needed Significant Relationships:  Adult Children, Spouse Lives with:  Adult Children, Spouse, Parents Do you feel safe going back to the place where you live?  Yes Need for family participation in patient care:  Yes (Comment)  Care giving concerns:  Patient lives in [place] with her husband Sara David, mother, daughter and granddaughter.    Social Worker assessment / plan:  Holiday representative (CSW) received SNF consult. PT is recommending SNF. CSW met with patient to discuss D/C plan. Patient was alert and oriented and sitting up in the chair. Patient reported that she lives with [place] with her husband Sara David, mother, daughter and granddaughter. Per patient she has a history of breast cancer and is no longer undergoing chemo or radiation therapy. Patient reported that she still takes a hormone blocker for breast cancer. CSW explained SNF process. Patient reported that she is agreeable to SNF search in [facility] however prefers to go home. Patient reported that her husband can provide 24/7 care. Patient prefers [facility] if she has to go to rehab.   FL2 complete and faxed out. CSW will continue to follow and assist as needed.   Employment status:   Retired Forensic scientist:  Medicare PT Recommendations:  [place] / Referral to community resources:  [place]  Patient/Family's Response to care:  Patient prefers to go home however is agreeable to [facility].   Patient/Family's Understanding of and Emotional Response to Diagnosis, Current Treatment, and Prognosis:  Patient was pleasant throughout assessment.   Emotional Assessment Appearance:  Appears stated age Attitude/Demeanor/Rapport:    Affect (typically observed):  Accepting, Adaptable, Pleasant Orientation:  Oriented to Self, Oriented to Place, Oriented to Situation, Oriented to  Time Alcohol / Substance use:  Not Applicable Psych involvement (Current and /or in the community):  No (Comment)  Discharge Needs  Concerns to be addressed:  Discharge Planning Concerns Readmission within the last 30 days:  No Current discharge risk:  Dependent with Mobility Barriers to Discharge:  Continued Medical Work up   Loralyn Freshwater, LCSW 10/04/2015, 1:40 PM

## 2015-10-04 NOTE — Evaluation (Signed)
Occupational Therapy Evaluation Patient Details Name: Sara David MRN: [id number] DOB: [birth year] Today's Date: 10/04/2015    History of Present Illness This patient is a 70 year old female who came to [facility] for a R THR (anterior)   Clinical Impression   This patient is a 70 year old female who came to [facility] for a R total hip replacement (anterior approach).  Patient lives with her husband in a one story home with 2 steps to enter with out a rail.  She had been independent with ADL and functional mobility using single point cane. She now shows deficits with pain, mobility and activities of daily living and would benefit from Occupational Therapy for ADL/functional mobility training while .staying within hip precautions (anterior approach).        Follow Up Recommendations  SNF    Equipment Recommendations       Recommendations for Other Services       Precautions / Restrictions Precautions Precautions: Anterior Hip;Fall Precaution Booklet Issued: No Restrictions Weight Bearing Restrictions: Yes RLE Weight Bearing: Weight bearing as tolerated      Mobility Bed Mobility  Transfers            Balance                                  ADL                                         General ADL Comments: Patient had been independent with ADL using a single point cane.  Practiced techniques for lower body dressing using hip kit (patient unable to reach feet). Practiced techniques to Donned/doffed socks and pants to knees (drain still in place). She needed minimal assist, set up and verbal cues and physical cues.       Vision     Perception     Praxis      Pertinent Vitals/Pain      Hand Dominance     Extremity/Trunk Assessment     Lower Extremity Assessment Lower Extremity Assessment: Overall WFL for tasks assessed       Communication Communication Communication: No difficulties   Cognition  Arousal/Alertness: Awake/alert  Behavior During Therapy: WFL for tasks assessed/performed Overall Cognitive Status: Within Functional Limits for tasks assessed                     General Comments       Exercises       Shoulder Instructions      Home Living Family/patient expects to be discharged to:: Private residence Living Arrangements: Spouse/significant other Available Help at Discharge: Family Type of Home: House Home Access: Stairs to enter Technical brewer of Steps: 2 Entrance Stairs-Rails: None                 Home Equipment: Cane - single point;Toilet riser          Prior Functioning/Environment Level of Independence: Independent with assistive device(s)             OT Diagnosis: Generalized weakness;Acute pain   OT Problem List: Decreased range of motion;Decreased activity tolerance;Impaired balance (sitting and/or standing);Decreased knowledge of use of DME or AE;Decreased knowledge of precautions;Pain   OT Treatment/Interventions: Self-care/ADL training;Therapeutic exercise    OT Goals(Current goals can be found  in the care plan section) Acute Rehab OT Goals Patient Stated Goal: to get stronger OT Goal Formulation: With patient Time For Goal Achievement: 10/18/15 Potential to Achieve Goals: Good  OT Frequency: Min 1X/week   Barriers to D/C:            Co-evaluation              End of Session Equipment Utilized During Treatment:  (hip kit)  Activity Tolerance: Patient limited by pain Patient left: in chair;with call bell/phone within reach;with chair alarm set;with family/visitor present   Time: 1310-1325 OT Time Calculation (min): 15 min Charges:  OT General Charges $OT Visit: 1 Procedure OT Evaluation $OT Eval Low Complexity: 1 Procedure G-Codes:    Myrene Galas, MS/OTR/L  10/04/2015, 1:31 PM

## 2015-10-04 NOTE — Anesthesia Postprocedure Evaluation (Signed)
Anesthesia Post Note  Patient: Sara David  Procedure(s) Performed: Procedure(s) (LRB): TOTAL HIP ARTHROPLASTY ANTERIOR APPROACH (Right)  Patient location during evaluation: Nursing Unit Anesthesia Type: Spinal Level of consciousness: awake and alert and awake Pain management: pain level controlled Vital Signs Assessment: post-procedure vital signs reviewed and stable Respiratory status: spontaneous breathing and nonlabored ventilation Cardiovascular status: blood pressure returned to baseline and stable Postop Assessment: no headache, patient able to bend at knees and no signs of nausea or vomiting Anesthetic complications: no    Last Vitals:  Filed Vitals:   10/04/15 0433 10/04/15 0456  BP: 92/38 98/52  Pulse: 76   Temp: 36.7 C   Resp: 20     Last Pain:  Filed Vitals:   10/04/15 0552  PainSc: 5                  Proofreader

## 2015-10-04 NOTE — Progress Notes (Signed)
Pt has been incontinent on several occasions today. She was Bladder scanned at at 1715 and she had 156 ml in bladder this was about 20 min prior to and episode. The last episode she actually felt an urge to urinate but could not get to the [facility]. Will continue to monitor explained to patient that is not that unusual to have problems with voiding after surgery. Pt has a low grade fever of 99.3. All information passed on to night nurse and plan of care has been explained to patient and family and our agreeable.

## 2015-10-04 NOTE — Progress Notes (Signed)
   Subjective: 1 Day Post-Op Procedure(s) (LRB): TOTAL HIP ARTHROPLASTY ANTERIOR APPROACH (Right) Patient reports pain as moderate.   Patient is well, and has had no acute complaints or problems Denies any CP, SOB, ABD pain. We will continue therapy today.  Plan is to go Home after hospital stay.  Objective: Vital signs in last 24 hours: Temp:  [97 F (36.1 C)-98.2 F (36.8 C)] 98.1 F (36.7 C) (02/15 0433) Pulse Rate:  [64-77] 76 (02/15 0433) Resp:  [10-20] 20 (02/15 0433) BP: (92-131)/(34-60) 98/52 mmHg (02/15 0456) SpO2:  [95 %-100 %] 98 % (02/15 0433) Weight:  [65.77 kg (144 lb 15.9 oz)-65.772 kg (145 lb)] 65.77 kg (144 lb 15.9 oz) (02/14 1339)  Intake/Output from previous day: 02/14 0701 - 02/15 0700 In: 1953.8 [P.O.:120; I.V.:1783.8; IV Piggyback:50] Out: 2070 [Urine:1650; Drains:120; Blood:300] Intake/Output this shift: Total I/O In: -  Out: 250 [Urine:250]   Recent Labs  10/03/15 1403  HGB 11.6*    Recent Labs  10/03/15 1403  WBC 7.8  RBC 3.59*  HCT 33.9*  PLT 198    Recent Labs  10/03/15 1403  CREATININE 0.86   No results for input(s): LABPT, INR in the last 72 hours.  EXAM General - Patient is Alert, Appropriate and Oriented Extremity - Neurovascular intact Sensation intact distally Intact pulses distally Dorsiflexion/Plantar flexion intact Dressing - dressing C/D/I, no drainage and hemovac intact Motor Function - intact, moving foot and toes well on exam.   Past Medical History  Diagnosis Date  . Hypertension   . Family history of adverse reaction to anesthesia     daughter gets nauseated  . Cancer ([place])     small intestines 2012, gallbladder 2016  . Breast cancer ([place]) 2012     no lumpectomy. Treating with meds    Assessment/Plan:   1 Day Post-Op Procedure(s) (LRB): TOTAL HIP ARTHROPLASTY ANTERIOR APPROACH (Right) Active Problems:   Primary osteoarthritis of right hip  Estimated body mass index is 24.88 kg/(m^2) as calculated  from the following:   Height as of this encounter: 5\' 4"  (1.626 m).   Weight as of this encounter: 65.77 kg (144 lb 15.9 oz). Advance diet Up with therapy  Needs BM Recheck labs in the am Hold HCTZ today Continue with IV fluids  DVT Prophylaxis - Lovenox, Foot Pumps and TED hose Weight-Bearing as tolerated to right leg D/C O2 and Pulse OX and try on Room Air  T. Rachelle Hora, PA-C [place] 10/04/2015, 7:52 AM

## 2015-10-04 NOTE — Care Management Note (Signed)
Case Management Note  Patient Details  Name: Sara David MRN: [id number] Date of Birth: 1945-11-03  Subjective/Objective:                  Met with patient after she worked with PT (who is now recommending SNF). Patient wants to go home with her husband but agrees to SNF search. She would like to use [facility]. She thinks she has a rolling walker and will have her husband bring to the hospital for review. [facility] uses [facility] for [phone] .   Action/Plan: List of home health agencies left with patient. Referral called to [place] home [facility]. Lovenox 27m # 14 [facility] to [facility]. RNCM will continue to follow.   Expected Discharge Date:                  Expected Discharge Plan:     In-House Referral:     Discharge planning Services  CM Consult  Post Acute Care Choice:  Home Health Choice offered to:  Patient  DME Arranged:    DME Agency:     HH Arranged:  PT HH Agency:  [place] Status of Service:  In process, will continue to follow  Medicare Important Message Given:    Date Medicare IM Given:    Medicare IM give by:    Date Additional Medicare IM Given:    Additional Medicare Important Message give by:     If [place] at [place], dates discussed:    Additional Comments:  AMarshell Garfinkel RN 10/04/2015, 11:21 AM

## 2015-10-04 NOTE — Progress Notes (Signed)
Physical Therapy Treatment Patient Details Name: Sara David MRN: [id number] DOB: 05/18/1946 Today's Date: 10/04/2015    History of Present Illness This patient is a 70 year old female who came to [facility] for a R THR (anterior)    PT Comments    Pt was found in chair and required min assist for sit to stand.  Pt required min assist x2 for ambulating 20 feet with RW.  Pt needed min assist with R LE progression during gait with RW.  Pt required verbal cues for LE and walker placement and strategies during gait.  Pt appeared to be limited by fatigue during session and stated her pain was 9/10.  Nursing was notified for pain and mobility status.     Follow Up Recommendations  SNF     Equipment Recommendations  Rolling walker with 5" wheels    Recommendations for Other Services       Precautions / Restrictions Precautions Precautions: Anterior Hip;Fall Precaution Booklet Issued: Yes Restrictions Weight Bearing Restrictions: Yes RLE Weight Bearing: Weight bearing as tolerated    Mobility  Bed Mobility Overal bed mobility: Needs Assistance Bed Mobility: Sit to Supine       Sit to supine: Mod assist   Required R LE assistance from sit to supine.  Transfers Overall transfer level: Needs assistance Equipment used: Rolling walker (2 wheeled) Transfers: Sit to/from Stand Sit to Stand: Min guard            Ambulation/Gait Ambulation/Gait assistance: +2 physical assistance;Min assist Ambulation Distance (Feet): 20 Feet Assistive device: Rolling walker (2 wheeled) Gait Pattern/deviations: Step-to pattern;Decreased step length - right;Decreased stance time - right     General Gait Details: slow antalgic gait pattern performed. Pt with forward flexed posture and pt required assistance with R LE progression;  however safe technique with no LOB. Step to gait pattern noted.     Stairs            Wheelchair Mobility    Modified Rankin (Stroke Patients Only)        Balance Overall balance assessment: Needs assistance Sitting-balance support: Feet supported Sitting balance-Leahy Scale: Good     Standing balance support: Bilateral upper extremity supported (RW) Standing balance-Leahy Scale: Good                      Cognition Arousal/Alertness: Awake/alert Behavior During Therapy: WFL for tasks assessed/performed Overall Cognitive Status: Within Functional Limits for tasks assessed                      Exercises      General Comments   Nursing was contacted and cleared pt for physical therapy.  Pt was agreeable and session was modified due to pain.  Visitor present at end of session.       Pertinent Vitals/Pain Pain Assessment: 0-10 Pain Score: 9  Pain Location: R ant thigh Pain Descriptors / Indicators: Constant;Aching;Operative site guarding Pain Intervention(s): Limited activity within patient's tolerance;Monitored during session;Premedicated before session;Repositioned  See flow sheet for vitals.     Home Living      Prior Function          PT Goals (current goals can now be found in the care plan section) Acute Rehab PT Goals Patient Stated Goal: to get stronger PT Goal Formulation: With patient Time For Goal Achievement: 10/17/15 Potential to Achieve Goals: Good Additional Goals Additional Goal #1: Pt will be able to perform bed mobility/transfers with rw and  supervision in order to improve functional mobility Progress towards PT goals: Progressing toward goals    Frequency  BID    PT Plan Current plan remains appropriate    Co-evaluation             End of Session Equipment Utilized During Treatment: Gait belt Activity Tolerance: Patient limited by pain Patient left: in bed;with call bell/phone within reach;with chair alarm set;with SCD's reapplied;with family/visitor present (Bilateral towel rolls under heels)     Time: [id number] PT Time Calculation (min) (ACUTE ONLY): 23  min  Charges:                       G Codes:      Sara David, SPT Sara David 10/04/2015, 2:53 PM

## 2015-10-04 NOTE — Progress Notes (Signed)
PT continues to recommend SNF. Clinical Education officer, museum (CSW) met with patient and presented bed offers. Patient chose [facility]. Plan is for patient to D/C to [facility] Friday (10/06/15) pending medical clearance. CSW will continue to follow and assist as needed.   Blima Rich, LCSW [phone]

## 2015-10-04 NOTE — Clinical Social Work Placement (Signed)
   CLINICAL SOCIAL WORK PLACEMENT  NOTE  Date:  10/04/2015  Patient Details  Name: Sara David MRN: [id number] Date of Birth: 1946/02/12  Clinical Social Work is seeking post-discharge placement for this patient at the [place] level of care (*CSW will initial, date and re-position this form in  chart as items are completed):  Yes   Patient/family provided with [place] Work Department's list of facilities offering this level of care within the geographic area requested by the patient (or if unable, by the patient's family).  Yes   Patient/family informed of their freedom to choose among providers that offer the needed level of care, that participate in Medicare, Medicaid or managed care program needed by the patient, have an available bed and are willing to accept the patient.  Yes   Patient/family informed of [place]'s ownership interest in [facility] and [facility], as well as of the fact that they are under no obligation to receive care at these facilities.  PASRR submitted to EDS on 10/04/15     PASRR number received on 10/04/15     Existing PASRR number confirmed on       FL2 transmitted to all facilities in geographic area requested by pt/family on 10/04/15     FL2 transmitted to all facilities within larger geographic area on       Patient informed that his/her managed care company has contracts with or will negotiate with certain facilities, including the following:        Yes   Patient/family informed of bed offers received.  Patient chooses bed at  [facility] )     Physician recommends and patient chooses bed at      Patient to be transferred to   on  .  Patient to be transferred to facility by       Patient family notified on   of transfer.  Name of family member notified:        PHYSICIAN       Additional Comment:    _______________________________________________ Loralyn Freshwater, LCSW 10/04/2015,  1:39 PM

## 2015-10-04 NOTE — Progress Notes (Signed)
Physical Therapy Treatment Patient Details Name: Sara David MRN: [id number] DOB: 1945/12/13 Today's Date: 10/04/2015    History of Present Illness Pt admitted for R THR.     PT Comments    Pt exhibited increased pain and stiffness in R anterior hip and thigh during session which limited bed and ambulation mobility.  Pt performed in bed exercises and required AAROM for heel slides and hip abduction.  Pt required mod assist with bed mobility, min assist for sit to stand and min assist x2 for ambulation of 10 feet with RW.  Pt required assist with R LE for limb progression during gait.     Follow Up Recommendations  SNF (Pending progress)     Equipment Recommendations  Rolling walker with 5" wheels    Recommendations for Other Services       Precautions / Restrictions Precautions Precautions: Anterior Hip;Fall Precaution Booklet Issued: Yes Restrictions Weight Bearing Restrictions: Yes RLE Weight Bearing: Weight bearing as tolerated    Mobility  Bed Mobility Overal bed mobility: Needs Assistance Bed Mobility: Supine to Sit     Supine to sit: Mod assist     General bed mobility comments: assist for scooting R LE off bed and scooting towards EOB. Once seated, pt able to sit with supervision.  Transfers Overall transfer level: Needs assistance Equipment used: Rolling walker (2 wheeled) Transfers: Sit to/from Stand Sit to Stand: Min guard         General transfer comment: Safe technique performed with rw. Cues given for pushing from bed  Ambulation/Gait Ambulation/Gait assistance: Min assist x2  Ambulation Distance (Feet): 10 Feet Assistive device: Rolling walker (2 wheeled) Gait Pattern/deviations: Step-to pattern;Decreased step length - right;Decreased stance time - right     General Gait Details: slow antalgic gait pattern performed. Pt with forward flexed posture and pt required assistance with R LE progression;  however safe technique with no LOB. Step to  gait pattern noted.     Stairs            Wheelchair Mobility    Modified Rankin (Stroke Patients Only)       Balance Overall balance assessment: Needs assistance Sitting-balance support: Feet supported;Single extremity supported (Bed rail ) Sitting balance-Leahy Scale: Good     Standing balance support: Bilateral upper extremity supported (RW) Standing balance-Leahy Scale: Good                      Cognition Arousal/Alertness: Awake/alert Behavior During Therapy: WFL for tasks assessed/performed Overall Cognitive Status: Within Functional Limits for tasks assessed                      Exercises Total Joint Exercises Ankle Circles/Pumps: AROM;Both;10 reps Quad Sets: AROM;Both;10 reps Gluteal Sets: AROM;Both;10 reps Short Arc Quad: AROM;Right;10 reps Heel Slides: AAROM;Right;5 reps Hip ABduction/ADduction: AAROM;Right;10 reps    General Comments   Nursing was contacted and cleared pt for physical therapy.  Pt was agreeable and session was limited due to pain.       Pertinent Vitals/Pain Pain Assessment: 0-10 Pain Score: 9  Pain Location: R Ant thigh  Pain Descriptors / Indicators: Constant;Aching;Operative site guarding Pain Intervention(s): Limited activity within patient's tolerance;Monitored during session;RN gave pain meds during session;Repositioned;Ice applied  See flow sheet for vitals.     Home Living                      Prior Function  PT Goals (current goals can now be found in the care plan section) Acute Rehab PT Goals Patient Stated Goal: to get stronger PT Goal Formulation: With patient Time For Goal Achievement: 10/17/15 Potential to Achieve Goals: Good Additional Goals Additional Goal #1: Pt will be able to perform bed mobility/transfers with rw and supervision in order to improve functional mobility Progress towards PT goals: Progressing toward goals    Frequency  BID    PT Plan  Disposition recommendations need to be updated.    Co-evaluation             End of Session Equipment Utilized During Treatment: Gait belt Activity Tolerance: Patient limited by pain Patient left: in chair;with chair alarm set;with SCD's reapplied (Bilateral towel rolls under heels, ice applied to R hip)     Time: [id number] PT Time Calculation (min) (ACUTE ONLY): 44 min  Charges:                       G Codes:      Mittie Bodo, SPT Mittie Bodo 10/04/2015, 10:21 AM

## 2015-10-05 ENCOUNTER — Inpatient Hospital Stay: Payer: Medicare Other

## 2015-10-05 LAB — BASIC METABOLIC PANEL
Anion gap: 5 (ref 5–15)
BUN: 15 mg/dL (ref 6–20)
CHLORIDE: 99 mmol/L — AB (ref 101–111)
CO2: 32 mmol/L (ref 22–32)
Calcium: 8.4 mg/dL — ABNORMAL LOW (ref 8.9–10.3)
Creatinine, Ser: 0.79 mg/dL (ref 0.44–1.00)
Glucose, Bld: 124 mg/dL — ABNORMAL HIGH (ref 65–99)
POTASSIUM: 3.5 mmol/L (ref 3.5–5.1)
SODIUM: 136 mmol/L (ref 135–145)

## 2015-10-05 LAB — CBC
HCT: 29.2 % — ABNORMAL LOW (ref 35.0–47.0)
HEMOGLOBIN: 10.1 g/dL — AB (ref 12.0–16.0)
MCH: 32.3 pg (ref 26.0–34.0)
MCHC: 34.8 g/dL (ref 32.0–36.0)
MCV: 92.8 fL (ref 80.0–100.0)
Platelets: 198 10*3/uL (ref 150–440)
RBC: 3.14 MIL/uL — AB (ref 3.80–5.20)
RDW: 13.9 % (ref 11.5–14.5)
WBC: 8.9 10*3/uL (ref 3.6–11.0)

## 2015-10-05 LAB — SURGICAL PATHOLOGY

## 2015-10-05 MED ORDER — OXYCODONE HCL 5 MG PO TABS: 5.0000 mg | ORAL_TABLET | ORAL | Status: DC | PRN

## 2015-10-05 MED ORDER — ENOXAPARIN SODIUM 40 MG/0.4ML ~~LOC~~ SOLN: 40.0000 mg | SUBCUTANEOUS | Status: DC

## 2015-10-05 MED ORDER — BISACODYL 10 MG RE SUPP
10.0000 mg | Freq: Every day | RECTAL | Status: DC | PRN
  Administered 2015-10-05: 10 mg via RECTAL
  Filled 2015-10-05 (×2): qty 1

## 2015-10-05 MED ORDER — GABAPENTIN 300 MG PO CAPS
300.0000 mg | ORAL_CAPSULE | Freq: Every day | ORAL | Status: DC
  Administered 2015-10-05: 300 mg via ORAL
  Filled 2015-10-05: qty 1

## 2015-10-05 MED ORDER — HYDROCODONE-ACETAMINOPHEN 7.5-325 MG PO TABS: 1.0000 | ORAL_TABLET | Freq: Four times a day (QID) | ORAL | Status: DC | PRN

## 2015-10-05 NOTE — Progress Notes (Signed)
Patient complaining of pain in back of left leg above knee, and states she thinks something is wrong. Dr Rudene Christians paged.

## 2015-10-05 NOTE — Progress Notes (Signed)
   Subjective: 2 Days Post-Op Procedure(s) (LRB): TOTAL HIP ARTHROPLASTY ANTERIOR APPROACH (Right) Patient reports pain as moderate.   Patient is well, and has had no acute complaints or problems Denies any CP, SOB, ABD pain. We will continue therapy today.  Plan is to go Skilled nursing facility after hospital stay.  Objective: Vital signs in last 24 hours: Temp:  [98.7 F (37.1 C)-99.7 F (37.6 C)] 99.5 F (37.5 C) (02/16 0537) Pulse Rate:  [78-87] 82 (02/16 0537) Resp:  [18-20] 18 (02/16 0537) BP: (102-134)/(34-72) 108/62 mmHg (02/16 0549) SpO2:  [90 %-100 %] 95 % (02/16 0537)  Intake/Output from previous day: 02/15 0701 - 02/16 0700 In: 1128 [P.O.:600; I.V.:528] Out: 590 [Urine:550; Drains:40] Intake/Output this shift:     Recent Labs  10/03/15 1403 10/05/15 0533  HGB 11.6* 10.1*    Recent Labs  10/03/15 1403 10/05/15 0533  WBC 7.8 8.9  RBC 3.59* 3.14*  HCT 33.9* 29.2*  PLT 198 198    Recent Labs  10/03/15 1403 10/05/15 0533  NA  --  136  K  --  3.5  CL  --  99*  CO2  --  32  BUN  --  15  CREATININE 0.86 0.79  GLUCOSE  --  124*  CALCIUM  --  8.4*   No results for input(s): LABPT, INR in the last 72 hours.  EXAM General - Patient is Alert, Appropriate and Oriented Extremity - Neurovascular intact Sensation intact distally Intact pulses distally Dorsiflexion/Plantar flexion intact Dressing - dressing C/D/I, no drainage, scant drainage and hemovac removed Motor Function - intact, moving foot and toes well on exam.   Past Medical History  Diagnosis Date  . Hypertension   . Family history of adverse reaction to anesthesia     daughter gets nauseated  . Cancer ([place])     small intestines 2012, gallbladder 2016  . Breast cancer ([place]) 2012     no lumpectomy. Treating with meds    Assessment/Plan:   2 Days Post-Op Procedure(s) (LRB): TOTAL HIP ARTHROPLASTY ANTERIOR APPROACH (Right) Active Problems:   Primary osteoarthritis of right  hip  Estimated body mass index is 24.88 kg/(m^2) as calculated from the following:   Height as of this encounter: 5\' 4"  (1.626 m).   Weight as of this encounter: 65.77 kg (144 lb 15.9 oz). Advance diet Up with therapy  Needs BM Recheck labs in the am Hold HCTZ today Plan on discharge to SNF Friday   DVT Prophylaxis - Lovenox, Foot Pumps and TED hose Weight-Bearing as tolerated to right leg D/C O2 and Pulse OX and try on Room Air  T. Rachelle Hora, PA-C [place] 10/05/2015, 7:09 AM

## 2015-10-05 NOTE — Progress Notes (Signed)
Physical Therapy Treatment Patient Details Name: Sara David MRN: [id number] DOB: September 11, 1945 Today's Date: 10/05/2015    History of Present Illness This patient is a 70 year old female who came to [facility] for a R THR (anterior)    PT Comments    Pt continues to c/o significant R anterior/lateral thigh pain with activity.  Pt had just returned to bed (upon PT arrival) and requesting bed ex's only this afternoon d/t significant pain.  Pt voicing concerns regarding significant pain and if it was "normal" (pt concerned d/t difficulty walking post surgery); nursing notified and reported she would notify MD.  Pt demonstrates pain to palpation mid R IT-band; decreased R glutes/hip extensor strength noted with ex's.  Slow improvement noted with R hip flexion strength (complicated d/t significant c/o pain with movement).  Will continue to progress pt per pt tolerance.   Follow Up Recommendations  SNF     Equipment Recommendations  Rolling walker with 5" wheels    Recommendations for Other Services       Precautions / Restrictions Precautions Precautions: Anterior Hip;Fall Restrictions Weight Bearing Restrictions: Yes RLE Weight Bearing: Weight bearing as tolerated    Mobility  Bed Mobility                  Transfers                    Ambulation/Gait                 Stairs            Wheelchair Mobility    Modified Rankin (Stroke Patients Only)       Balance                                    Cognition Arousal/Alertness: Awake/alert Behavior During Therapy: WFL for tasks assessed/performed Overall Cognitive Status: Within Functional Limits for tasks assessed                      Exercises   Performed semi-supine B LE therapeutic exercise x 10 reps:  Ankle pumps (AROM B LE's); quad sets x3 second holds (AROM B LE's); glute squeezes x3 second holds (AROM B); hip aDduction isometrics (pillow between pt's knees) x3 second  holds; SAQ's (AAROM R; AROM L); heelslides (AAROM R 2 sets of 10; AROM L), hip abd/adduction (AAROM R; AROM L), and SLR (AROM R x1 rep; AROM L).  Pt required vc's and tactile cues for correct technique with exercises.  Increased time to perform and decreased ROM on R LE with ex's d/t c/o significant pain.     General Comments   Pt agreeable to PT session.  Pt's family member present.       Pertinent Vitals/Pain Pain Assessment: 0-10 Pain Score: 10-Worst pain ever (with activity/movement) Pain Location: R anterior and lateral thigh Pain Descriptors / Indicators: Aching;Grimacing;Operative site guarding;Sharp;Shooting Pain Intervention(s): Limited activity within patient's tolerance;Monitored during session;Repositioned;Premedicated before session;Patient requesting pain meds-RN notified     Home Living                      Prior Function            PT Goals (current goals can now be found in the care plan section) Acute Rehab PT Goals Patient Stated Goal: to get stronger PT Goal Formulation: With patient Time For Goal  Achievement: 10/17/15 Potential to Achieve Goals: Good Additional Goals Additional Goal #1: Pt will be able to perform bed mobility/transfers with rw and supervision in order to improve functional mobility Progress towards PT goals: Progressing toward goals (in respect to LE strengthening)    Frequency  BID    PT Plan Current plan remains appropriate    Co-evaluation             End of Session   Activity Tolerance: Patient limited by pain Patient left: in bed;with call bell/phone within reach;with bed alarm set;with family/visitor present;with SCD's reapplied (B heels elevated via towel rolls)     Time: 1330-1400 PT Time Calculation (min) (ACUTE ONLY): 30 min  Charges:  $Therapeutic Exercise: 23-37 mins                    G CodesLeitha Bleak 10-06-2015, 2:27 PM Leitha Bleak, [place]

## 2015-10-05 NOTE — Progress Notes (Signed)
Physical Therapy Treatment Patient Details Name: Sara David MRN: [id number] DOB: Jan 03, 1946 Today's Date: 10/05/2015    History of Present Illness This patient is a 70 year old female who came to [facility] for a R THR (anterior)    PT Comments    Pt significantly limited in ambulation (8 feet with RW) d/t c/o significant R hip pain and unable to advance R LE on her own with ambulation (required min assist).  Pt demonstrating significant difficulty with R hip flexion active motion (c/o weakness and pain).  Nursing reporting unable to give pt any more pain meds currently d/t lower BP.  Will continue to progress pt per pt tolerance.  Continue to recommend STR upon discharge.   Follow Up Recommendations  SNF     Equipment Recommendations  Rolling walker with 5" wheels    Recommendations for Other Services       Precautions / Restrictions Precautions Precautions: Anterior Hip;Fall Restrictions Weight Bearing Restrictions: Yes RLE Weight Bearing: Weight bearing as tolerated    Mobility  Bed Mobility Overal bed mobility: Needs Assistance Bed Mobility: Supine to Sit     Supine to sit: Mod assist     General bed mobility comments: Assist for R LE and trunk  Transfers Overall transfer level: Needs assistance Equipment used: Rolling walker (2 wheeled) Transfers: Sit to/from Stand Sit to Stand: Min guard;Min assist (from bed min assist; from bedside commode and recliner CGA)  Stand step turn bed to commode: min assist with RW       General transfer comment: vc's required for hand and feet placement  Ambulation/Gait Ambulation/Gait assistance: Min assist;Min guard;+2 physical assistance Ambulation Distance (Feet): 8 Feet Assistive device: Rolling walker (2 wheeled) Gait Pattern/deviations: Step-to pattern;Decreased step length - right;Decreased stance time - right;Antalgic Gait velocity: decreased   General Gait Details: pt requiring assist to advance R LE (d/t c/o  significant pain and unable to advance on her own); vc's required for gait technique pattern and walker use; limited distance d/t c/o significant pain   Stairs            Wheelchair Mobility    Modified Rankin (Stroke Patients Only)       Balance Overall balance assessment: Needs assistance Sitting-balance support: Feet supported Sitting balance-Leahy Scale: Good     Standing balance support: Bilateral upper extremity supported (on RW) Standing balance-Leahy Scale: Good                      Cognition Arousal/Alertness: Awake/alert Behavior During Therapy: WFL for tasks assessed/performed Overall Cognitive Status: Within Functional Limits for tasks assessed                      Exercises   Performed sitting exercises x 10 reps B LE's:  LAQ's (AAROM R; AROM L); marching/hip flexion (AAROM R; AROM L).  Pt required vc's and tactile cues for correct technique with exercises.     General Comments General comments (skin integrity, edema, etc.): minimal drainage noted R hip dressing  Nursing cleared pt for participation in physical therapy.  Pt agreeable to PT session.      Pertinent Vitals/Pain Pain Assessment: 0-10 Pain Score: 10-Worst pain ever (with activity) Pain Location: R anterior thigh and low lateral R back pain Pain Descriptors / Indicators: Aching;Grimacing;Constant;Operative site guarding Pain Intervention(s): Limited activity within patient's tolerance;Monitored during session;Premedicated before session;Repositioned (RN notified but reports unable to give pain meds d/t lower BP; NA notified of need for  ice for ice pack)    Home Living                      Prior Function            PT Goals (current goals can now be found in the care plan section) Acute Rehab PT Goals Patient Stated Goal: to get stronger PT Goal Formulation: With patient Time For Goal Achievement: 10/17/15 Potential to Achieve Goals: Good Additional  Goals Additional Goal #1: Pt will be able to perform bed mobility/transfers with rw and supervision in order to improve functional mobility Progress towards PT goals: Not progressing toward goals - comment (limited ambulation d/t c/o significant pain)    Frequency  BID    PT Plan Current plan remains appropriate    Co-evaluation             End of Session Equipment Utilized During Treatment: Gait belt Activity Tolerance: Patient limited by fatigue;Patient limited by pain Patient left: in chair;with call bell/phone within reach;with chair alarm set;with SCD's reapplied (B heels elevated via towel rolls)     Time: [id number] PT Time Calculation (min) (ACUTE ONLY): 38 min  Charges:  $Gait Training: 8-22 mins $Therapeutic Exercise: 8-22 mins $Therapeutic Activity: 8-22 mins                    G CodesLeitha Bleak 2015-10-12, 10:09 AM Leitha Bleak, [place]

## 2015-10-05 NOTE — Care Management Important Message (Signed)
Important Message  Patient Details  Name: Sara David MRN: [id number] Date of Birth: [birth year]   Medicare Important Message Given:  Yes    Juliann Pulse A Jamica Woodyard 10/05/2015, 12:14 PM

## 2015-10-05 NOTE — Progress Notes (Signed)
Patient blood pressure low, blood pressure medications not given this am. Dr Rudene Christians notified. Patient is wanting pain medications. Per Dr Rudene Christians, ok to give pain medication, but hold BP medications is systolic less than [id number]. Orders placed.

## 2015-10-05 NOTE — Progress Notes (Signed)
PT WITH PAIN CONTINED LATERAL LOWER HIP. RN ADM ROBAXIN 500MG  PO AND TYLENOL 650MG  PO

## 2015-10-05 NOTE — Progress Notes (Signed)
OT Cancellation Note  Patient Details Name: Sara David MRN: [id number] DOB: 12/12/1945   Cancelled Treatment:    Reason Eval/Treat Not Completed: Other (comment). Patient's blood pressure 93/37. Will hold for now.  Sharon Mt 10/05/2015, 2:17 PM

## 2015-10-05 NOTE — Discharge Instructions (Signed)

## 2015-10-05 NOTE — Progress Notes (Addendum)
PT continues to recommend SNF today. Clinical Education officer, museum (CSW) met with patient at bedside to confirm plan. Patient is agreeable to D/C to [facility] tomorrow. Kim admissions coordinator at [facility] is aware of above. CSW will continue to follow and assist as needed.   Blima Rich, LCSW [phone]

## 2015-10-05 NOTE — Discharge Summary (Signed)
Physician Discharge Summary  Patient ID: Sara David MRN: [id number] DOB/AGE: 1946/04/18 70 y.o.  Admit date: 10/03/2015 Discharge date: 10/06/2015 Admission Diagnoses:  ACUTE HIP PAIN RIGHT   Discharge Diagnoses: Patient Active Problem List   Diagnosis Date Noted  . Primary osteoarthritis of right hip 10/03/2015  . Small bowel obstruction ([place]) 01/30/2015  . Back pain 01/30/2015  . B12 deficiency 01/30/2015  . Gastroesophageal reflux disease 01/30/2015  . IBS (irritable bowel syndrome) 01/30/2015  . Asthma 01/30/2015  . Hypothyroidism 01/30/2015  . FH: cholecystectomy 01/30/2015  . Breast cancer ([place]) 01/30/2015    Past Medical History  Diagnosis Date  . Hypertension   . Family history of adverse reaction to anesthesia     daughter gets nauseated  . Cancer ([place])     small intestines 2012, gallbladder 2016  . Breast cancer ([place]) 2012     no lumpectomy. Treating with meds     Transfusion: none   Consultants (if any):    Discharged Condition: Improved  Hospital Course: Marisha Mojica is an 70 y.o. female who was admitted 10/03/2015 with a diagnosis of right hip AVN and went to the operating room on 10/03/2015 and underwent the above named procedures.    Surgeries: Procedure(s): TOTAL HIP ARTHROPLASTY ANTERIOR APPROACH on 10/03/2015 Patient tolerated the surgery well. Taken to PACU where she was stabilized and then transferred to the orthopedic floor.  Started on Lovenox 40 q 24 hrs. Foot pumps applied bilaterally at 80 mm. Heels elevated on bed with rolled towels. No evidence of DVT. Negative Homan. Physical therapy started on day #1 for gait training and transfer. OT started day #1 for ADL and assisted devices.  Patient's foley was d/c on day #1. Patient's IV and hemovac was d/c on day #2.  On post op day #3 patient was stable and ready for discharge to SNF.  Implants: Medacta AMIS standard 2 stem with 52 millimeter Mpact cup DM with liner and S 28 mm head  She was  given perioperative antibiotics:  Anti-infectives    Start     Dose/Rate Route Frequency Ordered Stop   10/03/15 1700  ceFAZolin (ANCEF) IVPB 2 g/50 mL premix     2 g 100 mL/hr over 30 Minutes Intravenous Every 6 hours 10/03/15 1339 10/04/15 0457   10/03/15 0752  ceFAZolin (ANCEF) 2-3 GM-% IVPB SOLR    Comments:  KENNEDY, ASHLEY: cabinet override      10/03/15 0752 10/03/15 1944   10/03/15 0330  vancomycin (VANCOCIN) 500 mg in sodium chloride 0.9 % 100 mL IVPB     500 mg 100 mL/hr over 60 Minutes Intravenous  Once 10/03/15 0315 10/03/15 1029   10/03/15 0330  ceFAZolin (ANCEF) IVPB 2 g/50 mL premix     2 g 100 mL/hr over 30 Minutes Intravenous  Once 10/03/15 0315 10/03/15 1050    .  She was given sequential compression devices, early ambulation, and lovenox for DVT prophylaxis.  She benefited maximally from the hospital stay and there were no complications.    Recent vital signs:  Filed Vitals:   10/05/15 1414 10/05/15 1958  BP: 93/37 94/58  Pulse: 77 71  Temp: 97.9 F (36.6 C) 98.2 F (36.8 C)  Resp: 18 18    Recent laboratory studies:  Lab Results  Component Value Date   HGB 10.1* 10/05/2015   HGB 11.6* 10/03/2015   HGB 12.7 09/27/2015   Lab Results  Component Value Date   WBC 8.9 10/05/2015   PLT 198 10/05/2015  Lab Results  Component Value Date   INR 0.95 09/27/2015   Lab Results  Component Value Date   NA 136 10/05/2015   K 3.5 10/05/2015   CL 99* 10/05/2015   CO2 32 10/05/2015   BUN 15 10/05/2015   CREATININE 0.79 10/05/2015   GLUCOSE 124* 10/05/2015    Discharge Medications:     Medication List    TAKE these medications        acetaminophen 500 MG tablet  Commonly known as:  TYLENOL  Take 500-1,000 mg by mouth every 6 (six) hours as needed for mild pain or headache.     CENTRUM SILVER PO  Take 1 tablet by mouth every morning.     cetirizine 10 MG tablet  Commonly known as:  ZYRTEC  Take 10 mg by mouth daily. Reported on 10/03/2015      citalopram 20 MG tablet  Commonly known as:  CELEXA  Take 20 mg by mouth every morning.     dicyclomine 10 MG capsule  Commonly known as:  BENTYL  Reported on 10/03/2015     enoxaparin 40 MG/0.4ML injection  Commonly known as:  LOVENOX  Inject 0.4 mLs (40 mg total) into the skin daily.     Fluticasone-Salmeterol 100-50 MCG/DOSE Aepb  Commonly known as:  ADVAIR  Inhale 1 puff into the lungs every morning.     HYDROcodone-acetaminophen 7.5-325 MG tablet  Commonly known as:  NORCO  Take 1 tablet by mouth every 6 (six) hours as needed for moderate pain.     letrozole 2.5 MG tablet  Commonly known as:  FEMARA  TAKE 1 TABLET ONE TIME DAILY     levothyroxine 100 MCG tablet  Commonly known as:  SYNTHROID, LEVOTHROID  Take 100 mcg by mouth daily before breakfast.     meloxicam 7.5 MG tablet  Commonly known as:  MOBIC  Take 7.5 mg by mouth every morning. Reported on 10/03/2015     montelukast 10 MG tablet  Commonly known as:  SINGULAIR  Take 10 mg by mouth at bedtime.     omeprazole 20 MG capsule  Commonly known as:  PRILOSEC  Take 20 mg by mouth every morning.     oxyCODONE 5 MG immediate release tablet  Commonly known as:  Oxy IR/ROXICODONE  Take 1-2 tablets (5-10 mg total) by mouth every 3 (three) hours as needed for breakthrough pain.     pravastatin 40 MG tablet  Commonly known as:  PRAVACHOL  Take 40 mg by mouth at bedtime.     STOOL SOFTENER PO  Take 1 capsule by mouth every morning.     valsartan-hydrochlorothiazide 80-12.5 MG tablet  Commonly known as:  DIOVAN-HCT  Take 1 tablet by mouth every morning.     zolpidem 10 MG tablet  Commonly known as:  AMBIEN  TAKE ONE TABLET BY MOUTH AT BEDTIME AS NEEDED FOR SLEEP        Diagnostic Studies: Dg Hip Unilat With Pelvis 1v Right  10/05/2015  CLINICAL DATA:  Right hip pain after surgery. EXAM: DG HIP (WITH OR WITHOUT PELVIS) 1V RIGHT COMPARISON:  October 03, 2015. FINDINGS: Status post right hip arthroplasty.  Prosthesis appears to be well situated. No fracture or dislocation is noted. Surgical staples are noted in the adjacent soft tissues. IMPRESSION: Status post right hip arthroplasty.  No acute abnormality seen. Electronically Signed   By: Marijo Conception, M.D.   On: 10/05/2015 15:48   Dg Hip Operative Unilat W Or W/o  Pelvis Right  10/03/2015  CLINICAL DATA:  Right hip osteoarthritis. EXAM: OPERATIVE RIGHT HIP (WITH PELVIS IF PERFORMED) 2 VIEWS TECHNIQUE: Fluoroscopic spot image(s) were submitted for interpretation post-operatively. COMPARISON:  None. FINDINGS: Right hip prosthesis is seen in appropriate position. No fracture or dislocation identified. IMPRESSION: Right hip prosthesis in appropriate position. No complication identified. Electronically Signed   By: Earle Gell M.D.   On: 10/03/2015 12:02   Dg Hip Unilat W Or W/o Pelvis 2-3 Views Right  10/03/2015  CLINICAL DATA:  Postop right hip pain. EXAM: DG HIP (WITH OR WITHOUT PELVIS) 2-3V RIGHT COMPARISON:  None. FINDINGS: Changes of right hip replacement. Soft tissue drains in place. Normal alignment. No hardware complicating feature. IMPRESSION: Right hip replacement.  No visible complicating feature. Electronically Signed   By: Rolm Baptise M.D.   On: 10/03/2015 13:23    Disposition: 01-Home or Self Care        Follow-up Information    Follow up with [facility] .   Specialty:  [place] information:   [address] [place] [phone]      Follow up with MENZ,MICHAEL, MD In 2 weeks.   Specialty:  Orthopedic Surgery   Why:  For staple removal and skin check   Contact information:   [place] [address] [phone]        Signed: Feliberto Gottron 10/05/2015, 11:00 PM

## 2015-10-06 ENCOUNTER — Encounter
Admission: RE | Admit: 2015-10-06 | Discharge: 2015-10-06 | Disposition: A | Discharge status: Home / Self Care | Payer: Medicare Other | Source: Ambulatory Visit | Attending: Internal Medicine | Admitting: Internal Medicine

## 2015-10-06 DIAGNOSIS — R32 Unspecified urinary incontinence: Secondary | ICD-10-CM | POA: Insufficient documentation

## 2015-10-06 DIAGNOSIS — R3915 Urgency of urination: Secondary | ICD-10-CM | POA: Insufficient documentation

## 2015-10-06 MED ORDER — DICYCLOMINE HCL 10 MG PO CAPS
10.0000 mg | ORAL_CAPSULE | Freq: Once | ORAL | Status: AC
  Administered 2015-10-06: 10 mg via ORAL
  Filled 2015-10-06: qty 1

## 2015-10-06 NOTE — Clinical Social Work Placement (Signed)
   CLINICAL SOCIAL WORK PLACEMENT  NOTE  Date:  10/06/2015  Patient Details  Name: Sara David MRN: [id number] Date of Birth: 01-10-1946  Clinical Social Work is seeking post-discharge placement for this patient at the [place] level of care (*CSW will initial, date and re-position this form in  chart as items are completed):  Yes   Patient/family provided with [facility] Work Department's list of facilities offering this level of care within the geographic area requested by the patient (or if unable, by the patient's family).  Yes   Patient/family informed of their freedom to choose among providers that offer the needed level of care, that participate in Medicare, Medicaid or managed care program needed by the patient, have an available bed and are willing to accept the patient.  Yes   Patient/family informed of [facility]'s ownership interest in [facility] and [facility], as well as of the fact that they are under no obligation to receive care at these facilities.  PASRR submitted to EDS on 10/04/15     PASRR number received on 10/04/15     Existing PASRR number confirmed on       FL2 transmitted to all facilities in geographic area requested by pt/family on 10/04/15     FL2 transmitted to all facilities within larger geographic area on       Patient informed that his/her managed care company has contracts with or will negotiate with certain facilities, including the following:        Yes   Patient/family informed of bed offers received.  Patient chooses bed at  [facility] )     Physician recommends and patient chooses bed at      Patient to be transferred to  [facility] ) on 10/06/15.  Patient to be transferred to facility by  [facility] )     Patient family notified on 10/06/15 of transfer.  Name of family member notified:   (Patient's sister in law is at bedside and aware of D/C today. )     PHYSICIAN     Additional Comment:    _______________________________________________ Loralyn Fresh[place], LCSW 10/06/2015, 10:34 AM

## 2015-10-06 NOTE — Progress Notes (Signed)
Patient is medically stable for D/C to [facility] today. Per Kim admissions coordinator at [facility] patient will go to room 202-A. RN will call report at [phone] and arrange EMS for transport. Clinical Education officer, museum (CSW) sent D/C Summary, FL2 and D/C Packet to [employer] via [employer]. Patient is aware of above. Per patient she has notified her family of D/C today. Patient's sister in law is at bedside and aware of above. D/C today. Please reconsult if future social work needs arise. CSW signing off.   Blima Rich, LCSW [phone]

## 2015-10-06 NOTE — Progress Notes (Signed)
Physical Therapy Treatment Patient Details Name: Sara David MRN: [id number] DOB: 1945-12-22 Today's Date: 10/06/2015    History of Present Illness This patient is a 70 year old female who came to [facility] for a R THR (anterior)    PT Comments    Pt able to advance R LE on her own today ambulating with RW for 23 feet (with extra effort and concentration and cueing).  Distance limited d/t increasing R hip pain with distance and then pt unable to advance R LE anymore on her own (and too painful to continue even with assist).  Plan to discharge to STR today.   Follow Up Recommendations  SNF     Equipment Recommendations  Rolling walker with 5" wheels    Recommendations for Other Services       Precautions / Restrictions Precautions Precautions: Anterior Hip;Fall Restrictions Weight Bearing Restrictions: Yes RLE Weight Bearing: Weight bearing as tolerated    Mobility  Bed Mobility               General bed mobility comments: Deferred d/t pt already sitting up in chair  Transfers Overall transfer level: Needs assistance Equipment used: Rolling walker (2 wheeled) Transfers: Sit to/from Stand Sit to Stand: Min guard         General transfer comment: vc's required for hand placement  Ambulation/Gait Ambulation/Gait assistance: Min guard;Min assist Ambulation Distance (Feet): 23 Feet Assistive device: Rolling walker (2 wheeled) Gait Pattern/deviations: Step-to pattern;Decreased step length - right;Decreased stance time - right;Antalgic Gait velocity: decreased   General Gait Details: pt able to advance R LE initially on own with significant extra effort and concentration; distance limited d/t increasing R hip pain and unable to advance R LE on her own any more; vc's for gait technique required   Stairs            Wheelchair Mobility    Modified Rankin (Stroke Patients Only)       Balance                                    Cognition  Arousal/Alertness: Awake/alert Behavior During Therapy: WFL for tasks assessed/performed Overall Cognitive Status: Within Functional Limits for tasks assessed                      Exercises   Performed sitting exercises x 10 reps B LE's:  Heel/toe raises (AROM R; AROM L); LAQ's (AROM R; AROM L); marching/hip flexion (AAROM R; AROM L).  Pt required vc's and tactile cues for correct technique with exercises.     General Comments  Pt agreeable to PT session.      Pertinent Vitals/Pain Pain Assessment: 0-10 Pain Score: 9  (2/10 at rest; 9/10 with progressive distance ambulating) Pain Location: R anterior and lateral thigh Pain Descriptors / Indicators: Grimacing;Operative site guarding;Aching;Sharp;Shooting Pain Intervention(s): Limited activity within patient's tolerance;Monitored during session;Premedicated before session;Repositioned  Vitals stable and WFL throughout treatment session.    Home Living                      Prior Function            PT Goals (current goals can now be found in the care plan section) Acute Rehab PT Goals Patient Stated Goal: to get stronger PT Goal Formulation: With patient Time For Goal Achievement: 10/17/15 Potential to Achieve Goals: Good Additional Goals Additional  Goal #1: Pt will be able to perform bed mobility/transfers with rw and supervision in order to improve functional mobility Progress towards PT goals: Progressing toward goals    Frequency  BID    PT Plan Current plan remains appropriate    Co-evaluation             End of Session Equipment Utilized During Treatment: Gait belt Activity Tolerance: Patient limited by pain Patient left: in chair;with call bell/phone within reach;with chair alarm set;with SCD's reapplied (pt refused towel rolls under heels)     Time: [id number] PT Time Calculation (min) (ACUTE ONLY): 24 min  Charges:  $Gait Training: 8-22 mins $Therapeutic Exercise: 8-22 mins                     G CodesLeitha Bleak 10/26/15, 10:17 AM Leitha Bleak, [place]

## 2015-10-06 NOTE — Care Management (Signed)
I have cancelled Lovenox with [facility]. I have notified [facility] that patient will be going to SNF today. No further RNCM needs. CSW will assist with SNF.

## 2015-10-06 NOTE — Progress Notes (Signed)
Called nonemergent transport.

## 2015-10-06 NOTE — Progress Notes (Signed)
Notified Rachelle Hora, [place] earlier this shift of blood pressure and he was aware and advised to proceed.  Patient not symptomatic.

## 2015-10-06 NOTE — Progress Notes (Signed)
Per Rachelle Hora, PA reviewed varying blood pressures.  He is ok with patient discharging as patient denies any shortness of breath, light headedness.

## 2015-10-06 NOTE — Progress Notes (Signed)
Called report to [place] spoke with [place].  Will call for nonemergent transportation after she eats some lunch.

## 2015-10-06 NOTE — Progress Notes (Signed)
   Subjective: 3 Days Post-Op Procedure(s) (LRB): TOTAL HIP ARTHROPLASTY ANTERIOR APPROACH (Right) Patient reports pain as moderate.   Patient is well, and has had no acute complaints or problems Denies any CP, SOB, ABD pain. We will continue therapy today.  Plan is to go Skilled nursing facility after hospital stay.  Objective: Vital signs in last 24 hours: Temp:  [97.9 F (36.6 C)-98.4 F (36.9 C)] 98.2 F (36.8 C) (02/17 0743) Pulse Rate:  [69-77] 72 (02/17 0743) Resp:  [16-18] 18 (02/17 0743) BP: (93-102)/(31-58) 95/31 mmHg (02/17 0743) SpO2:  [98 %-100 %] 98 % (02/17 0743)  Intake/Output from previous day: 02/16 0701 - 02/17 0700 In: 600 [P.O.:600] Out: 0  Intake/Output this shift:     Recent Labs  10/03/15 1403 10/05/15 0533  HGB 11.6* 10.1*    Recent Labs  10/03/15 1403 10/05/15 0533  WBC 7.8 8.9  RBC 3.59* 3.14*  HCT 33.9* 29.2*  PLT 198 198    Recent Labs  10/03/15 1403 10/05/15 0533  NA  --  136  K  --  3.5  CL  --  99*  CO2  --  32  BUN  --  15  CREATININE 0.86 0.79  GLUCOSE  --  124*  CALCIUM  --  8.4*   No results for input(s): LABPT, INR in the last 72 hours.  EXAM General - Patient is Alert, Appropriate and Oriented Extremity - Neurovascular intact Sensation intact distally Intact pulses distally Dorsiflexion/Plantar flexion intact Dressing - dressing C/D/I and no drainage Motor Function - intact, moving foot and toes well on exam.   Past Medical History  Diagnosis Date  . Hypertension   . Family history of adverse reaction to anesthesia     daughter gets nauseated  . Cancer ([place])     small intestines 2012, gallbladder 2016  . Breast cancer ([place]) 2012     no lumpectomy. Treating with meds    Assessment/Plan:   3 Days Post-Op Procedure(s) (LRB): TOTAL HIP ARTHROPLASTY ANTERIOR APPROACH (Right) Active Problems:   Primary osteoarthritis of right hip  Estimated body mass index is 24.88 kg/(m^2) as calculated from the  following:   Height as of this encounter: 5\' 4"  (1.626 m).   Weight as of this encounter: 65.77 kg (144 lb 15.9 oz). Advance diet Up with therapy  Hold HCTZ today Plan on discharge to SNF today pending BM Follow up with [place] [facility] in 2 weeks   DVT Prophylaxis - Lovenox, Foot Pumps and TED hose Weight-Bearing as tolerated to right leg D/C O2 and Pulse OX and try on Room Air  T. Rachelle Hora, PA-C [place] 10/06/2015, 8:03 AM

## 2015-10-13 LAB — URINALYSIS COMPLETE WITH MICROSCOPIC (ARMC ONLY)
BACTERIA UA: NONE SEEN
Bilirubin Urine: NEGATIVE
Glucose, UA: NEGATIVE mg/dL
Hgb urine dipstick: NEGATIVE
Ketones, ur: NEGATIVE mg/dL
Leukocytes, UA: NEGATIVE
Nitrite: NEGATIVE
PROTEIN: NEGATIVE mg/dL
RBC / HPF: NONE SEEN RBC/hpf (ref 0–5)
SPECIFIC GRAVITY, URINE: 1.023 (ref 1.005–1.030)
SQUAMOUS EPITHELIAL / LPF: NONE SEEN
pH: 5 (ref 5.0–8.0)

## 2015-10-15 LAB — URINE CULTURE: Culture: 50000

## 2015-10-17 ENCOUNTER — Encounter: Payer: Self-pay | Admitting: Oncology

## 2015-10-17 ENCOUNTER — Inpatient Hospital Stay: Payer: Medicare Other

## 2015-10-17 ENCOUNTER — Inpatient Hospital Stay: Payer: Medicare Other | Attending: Oncology | Admitting: Oncology

## 2015-10-17 VITALS — BP 130/79 | HR 71 | Temp 97.9°F | Wt 150.6 lb

## 2015-10-17 DIAGNOSIS — E039 Hypothyroidism, unspecified: Secondary | ICD-10-CM | POA: Insufficient documentation

## 2015-10-17 DIAGNOSIS — C7889 Secondary malignant neoplasm of other digestive organs: Secondary | ICD-10-CM

## 2015-10-17 DIAGNOSIS — F17210 Nicotine dependence, cigarettes, uncomplicated: Secondary | ICD-10-CM | POA: Diagnosis not present

## 2015-10-17 DIAGNOSIS — M2550 Pain in unspecified joint: Secondary | ICD-10-CM | POA: Insufficient documentation

## 2015-10-17 DIAGNOSIS — M5090 Cervical disc disorder, unspecified, unspecified cervical region: Secondary | ICD-10-CM | POA: Insufficient documentation

## 2015-10-17 DIAGNOSIS — K589 Irritable bowel syndrome without diarrhea: Secondary | ICD-10-CM | POA: Insufficient documentation

## 2015-10-17 DIAGNOSIS — Z79811 Long term (current) use of aromatase inhibitors: Secondary | ICD-10-CM | POA: Insufficient documentation

## 2015-10-17 DIAGNOSIS — I10 Essential (primary) hypertension: Secondary | ICD-10-CM | POA: Diagnosis not present

## 2015-10-17 DIAGNOSIS — C784 Secondary malignant neoplasm of small intestine: Secondary | ICD-10-CM | POA: Diagnosis not present

## 2015-10-17 DIAGNOSIS — M542 Cervicalgia: Secondary | ICD-10-CM | POA: Insufficient documentation

## 2015-10-17 DIAGNOSIS — R110 Nausea: Secondary | ICD-10-CM | POA: Insufficient documentation

## 2015-10-17 DIAGNOSIS — C50911 Malignant neoplasm of unspecified site of right female breast: Secondary | ICD-10-CM | POA: Diagnosis not present

## 2015-10-17 DIAGNOSIS — M549 Dorsalgia, unspecified: Secondary | ICD-10-CM | POA: Diagnosis not present

## 2015-10-17 DIAGNOSIS — G5600 Carpal tunnel syndrome, unspecified upper limb: Secondary | ICD-10-CM | POA: Insufficient documentation

## 2015-10-17 DIAGNOSIS — E782 Mixed hyperlipidemia: Secondary | ICD-10-CM | POA: Insufficient documentation

## 2015-10-17 DIAGNOSIS — C50919 Malignant neoplasm of unspecified site of unspecified female breast: Secondary | ICD-10-CM

## 2015-10-17 DIAGNOSIS — Z79899 Other long term (current) drug therapy: Secondary | ICD-10-CM | POA: Insufficient documentation

## 2015-10-17 DIAGNOSIS — K219 Gastro-esophageal reflux disease without esophagitis: Secondary | ICD-10-CM | POA: Insufficient documentation

## 2015-10-17 DIAGNOSIS — Z170 Estrogen receptor positive status [ER+]: Secondary | ICD-10-CM | POA: Diagnosis not present

## 2015-10-17 DIAGNOSIS — C50411 Malignant neoplasm of upper-outer quadrant of right female breast: Secondary | ICD-10-CM | POA: Insufficient documentation

## 2015-10-17 DIAGNOSIS — C7951 Secondary malignant neoplasm of bone: Secondary | ICD-10-CM | POA: Diagnosis not present

## 2015-10-17 DIAGNOSIS — J45909 Unspecified asthma, uncomplicated: Secondary | ICD-10-CM | POA: Insufficient documentation

## 2015-10-17 LAB — CBC WITH DIFFERENTIAL/PLATELET
Basophils Absolute: 0 10*3/uL (ref 0–0.1)
Basophils Relative: 1 %
EOS ABS: 0.1 10*3/uL (ref 0–0.7)
Eosinophils Relative: 1 %
HEMATOCRIT: 29 % — AB (ref 35.0–47.0)
HEMOGLOBIN: 10 g/dL — AB (ref 12.0–16.0)
LYMPHS ABS: 0.9 10*3/uL — AB (ref 1.0–3.6)
LYMPHS PCT: 13 %
MCH: 32.2 pg (ref 26.0–34.0)
MCHC: 34.3 g/dL (ref 32.0–36.0)
MCV: 93.9 fL (ref 80.0–100.0)
MONOS PCT: 5 %
Monocytes Absolute: 0.3 10*3/uL (ref 0.2–0.9)
NEUTROS ABS: 5.4 10*3/uL (ref 1.4–6.5)
NEUTROS PCT: 80 %
Platelets: 377 10*3/uL (ref 150–440)
RBC: 3.09 MIL/uL — ABNORMAL LOW (ref 3.80–5.20)
RDW: 13.9 % (ref 11.5–14.5)
WBC: 6.8 10*3/uL (ref 3.6–11.0)

## 2015-10-17 LAB — BASIC METABOLIC PANEL
Anion gap: 7 (ref 5–15)
BUN: 19 mg/dL (ref 6–20)
CHLORIDE: 102 mmol/L (ref 101–111)
CO2: 25 mmol/L (ref 22–32)
CREATININE: 0.95 mg/dL (ref 0.44–1.00)
Calcium: 8.7 mg/dL — ABNORMAL LOW (ref 8.9–10.3)
GFR calc non Af Amer: 60 mL/min — ABNORMAL LOW (ref >60)
Glucose, Bld: 107 mg/dL — ABNORMAL HIGH (ref 65–99)
Potassium: 4.1 mmol/L (ref 3.5–5.1)
Sodium: 134 mmol/L — ABNORMAL LOW (ref 135–145)

## 2015-10-18 LAB — CANCER ANTIGEN 27.29: CA 27.29: 14.8 U/mL (ref 0.0–38.6)

## 2015-10-20 ENCOUNTER — Other Ambulatory Visit: Payer: Self-pay | Admitting: *Deleted

## 2015-10-20 MED ORDER — ZOLPIDEM TARTRATE 10 MG PO TABS: 10.0000 mg | ORAL_TABLET | Freq: Every evening | ORAL | Status: DC | PRN

## 2015-10-28 NOTE — Progress Notes (Signed)
[place]  [phone] [phone]) [phone]  ID: Sara David OB: [birth year]  MR#: [id number]  [id number]  [name], [name]  CHIEF COMPLAINT:  Chief Complaint  Patient presents with  . Breast Cancer    INTERVAL HISTORY: Patient returns to clinic today for further evaluation and laboratory work. Her hip pain has significantly improved but still evident after her recent hip replacement surgery. She otherwise feels well.  She has no neurologic complaints.  She denies any chest pain or shortness of breath.  She has a fair appetite and denies weight loss. She denies vomiting or constipation.  She has no melena or hematochezia.  She has no urinary complaints.  Patient offers no further specific complaints today.   REVIEW OF SYSTEMS:   Review of Systems  Constitutional: Negative.  Negative for fever, weight loss and malaise/fatigue.  Respiratory: Negative.  Negative for shortness of breath.   Cardiovascular: Negative.  Negative for chest pain.  Gastrointestinal: Negative.  Negative for nausea, abdominal pain and diarrhea.  Musculoskeletal: Positive for back pain and joint pain.  Neurological: Negative.  Negative for sensory change and weakness.    As per HPI. Otherwise, a complete review of systems is negatve.  PAST MEDICAL HISTORY: Past Medical History  Diagnosis Date  . Hypertension   . Family history of adverse reaction to anesthesia     daughter gets nauseated  . Cancer ([place])     small intestines 2012, gallbladder 2016  . Breast cancer ([place]) 2012     no lumpectomy. Treating with meds    PAST SURGICAL HISTORY: Past Surgical History  Procedure Laterality Date  . Back surgery    . Cholecystectomy    . Breast excisional biopsy Right 1995    benign  . Abdominal hysterectomy    . Breast biopsy Right 2012    positive  . Breast biopsy Right 2014    positive  . Total hip  arthroplasty Right 10/03/2015    Procedure: TOTAL HIP ARTHROPLASTY ANTERIOR APPROACH;  Surgeon: Hessie Knows, MD;  Location: [facility] ORS;  Service: Orthopedics;  Laterality: Right;    FAMILY HISTORY: Reviewed and unchanged. No reported history of breast cancer or other chronic diseases.     ADVANCED DIRECTIVES:    HEALTH MAINTENANCE: Social History  Substance Use Topics  . Smoking status: Current Every Day Smoker -- 0.50 packs/day    Types: Cigarettes  . Smokeless tobacco: None  . Alcohol Use: No     Colonoscopy:  PAP:  Bone density:  Lipid panel:  Allergies  Allergen Reactions  . Diclofenac Sodium Nausea Only  . Erythromycin Diarrhea and Nausea And Vomiting  . Tape Itching and Rash  . Wellbutrin [Bupropion] Rash    Current Outpatient Prescriptions  Medication Sig Dispense Refill  . acetaminophen (TYLENOL) 500 MG tablet Take 500-1,000 mg by mouth every 6 (six) hours as needed for mild pain or headache.    . cetirizine (ZYRTEC) 10 MG tablet Take 10 mg by mouth daily. Reported on 10/03/2015    . citalopram (CELEXA) 20 MG tablet Take 20 mg by mouth every morning.     . dicyclomine (BENTYL) 10 MG capsule Reported on 10/03/2015  2  . Docusate Calcium (STOOL SOFTENER PO) Take 1 capsule by mouth every morning.    . enoxaparin (LOVENOX) 40 MG/0.4ML injection Inject 0.4 mLs (40 mg total) into the skin daily. 40 Syringe 0  . Fluticasone-Salmeterol (ADVAIR) 100-50 MCG/DOSE AEPB Inhale  1 puff into the lungs every morning.     Marland Kitchen letrozole (FEMARA) 2.5 MG tablet TAKE 1 TABLET ONE TIME DAILY (Patient taking differently: TAKE 1 TABLET ONE TIME DAILY in am) 90 tablet 4  . levothyroxine (SYNTHROID, LEVOTHROID) 100 MCG tablet Take 100 mcg by mouth daily before breakfast.     . montelukast (SINGULAIR) 10 MG tablet Take 10 mg by mouth at bedtime.     . Multiple Vitamins-Minerals (CENTRUM SILVER PO) Take 1 tablet by mouth every morning.    Marland Kitchen omeprazole (PRILOSEC) 20 MG capsule Take 20 mg by mouth  every morning.     Marland Kitchen oxyCODONE (OXY IR/ROXICODONE) 5 MG immediate release tablet Take 1-2 tablets (5-10 mg total) by mouth every 3 (three) hours as needed for breakthrough pain. 30 tablet 0  . pravastatin (PRAVACHOL) 40 MG tablet Take 40 mg by mouth at bedtime.     . promethazine (PHENERGAN) 25 MG tablet TAKE 1 TABLET (25 MG TOTAL) BY MOUTH EVERY 6 (SIX) HOURS AS NEEDED FOR NAUSEA OR VOMITING.  1  . sucralfate (CARAFATE) 1 g tablet TAKE 1 TABLET (1 G TOTAL) BY MOUTH 3 (THREE) TIMES DAILY.  3  . valsartan-hydrochlorothiazide (DIOVAN-HCT) 80-12.5 MG per tablet Take 1 tablet by mouth every morning.     Marland Kitchen HYDROcodone-acetaminophen (NORCO) 7.5-325 MG tablet Take 1 tablet by mouth every 6 (six) hours as needed for moderate pain. (Patient not taking: Reported on 10/17/2015) 90 tablet 0  . meloxicam (MOBIC) 7.5 MG tablet Take 7.5 mg by mouth every morning. Reported on 10/17/2015    . zolpidem (AMBIEN) 10 MG tablet Take 1 tablet (10 mg total) by mouth at bedtime as needed. for sleep 30 tablet 0   No current facility-administered medications for this visit.    OBJECTIVE: Filed Vitals:   10/17/15 1016  BP: 130/79  Pulse: 71  Temp: 97.9 F (36.6 C)     Body mass index is 25.83 kg/(m^2).    ECOG FS:1 - Symptomatic but completely ambulatory  General: Well-developed, well-nourished, no acute distress. Eyes: Pink conjunctiva, anicteric sclera. Lungs: Clear to auscultation bilaterally. Heart: Regular rate and rhythm. No rubs, murmurs, or gallops. Abdomen: Soft, nontender, nondistended. No organomegaly noted, normoactive bowel sounds. Musculoskeletal: No edema, cyanosis, or clubbing.  Well-healing surgical scar on lumbar spine without erythema. Neuro: Alert, answering all questions appropriately. Cranial nerves grossly intact. Skin: No rashes or petechiae noted. Psych: Normal affect.   LAB RESULTS:  Lab Results  Component Value Date   NA 134* 10/17/2015   K 4.1 10/17/2015   CL 102 10/17/2015    CO2 25 10/17/2015   GLUCOSE 107* 10/17/2015   BUN 19 10/17/2015   CREATININE 0.95 10/17/2015   CALCIUM 8.7* 10/17/2015   PROT 7.2 08/23/2015   ALBUMIN 4.0 08/23/2015   AST 19 08/23/2015   ALT 20 08/23/2015   ALKPHOS 66 08/23/2015   BILITOT 0.4 08/23/2015   GFRNONAA 60* 10/17/2015   GFRAA >60 10/17/2015    Lab Results  Component Value Date   WBC 6.8 10/17/2015   NEUTROABS 5.4 10/17/2015   HGB 10.0* 10/17/2015   HCT 29.0* 10/17/2015   MCV 93.9 10/17/2015   PLT 377 10/17/2015   Lab Results  Component Value Date   LABCA2 14.8 10/17/2015     STUDIES: Dg Hip Unilat With Pelvis 1v Right  10/05/2015  CLINICAL DATA:  Right hip pain after surgery. EXAM: DG HIP (WITH OR WITHOUT PELVIS) 1V RIGHT COMPARISON:  October 03, 2015. FINDINGS: Status post right  hip arthroplasty. Prosthesis appears to be well situated. No fracture or dislocation is noted. Surgical staples are noted in the adjacent soft tissues. IMPRESSION: Status post right hip arthroplasty.  No acute abnormality seen. Electronically Signed   By: Marijo Conception, M.D.   On: 10/05/2015 15:48   Dg Hip Operative Unilat W Or W/o Pelvis Right  10/03/2015  CLINICAL DATA:  Right hip osteoarthritis. EXAM: OPERATIVE RIGHT HIP (WITH PELVIS IF PERFORMED) 2 VIEWS TECHNIQUE: Fluoroscopic spot image(s) were submitted for interpretation post-operatively. COMPARISON:  None. FINDINGS: Right hip prosthesis is seen in appropriate position. No fracture or dislocation identified. IMPRESSION: Right hip prosthesis in appropriate position. No complication identified. Electronically Signed   By: Earle Gell M.D.   On: 10/03/2015 12:02   Dg Hip Unilat W Or W/o Pelvis 2-3 Views Right  10/03/2015  CLINICAL DATA:  Postop right hip pain. EXAM: DG HIP (WITH OR WITHOUT PELVIS) 2-3V RIGHT COMPARISON:  None. FINDINGS: Changes of right hip replacement. Soft tissue drains in place. Normal alignment. No hardware complicating feature. IMPRESSION: Right hip replacement.   No visible complicating feature. Electronically Signed   By: Rolm Baptise M.D.   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  PLAN:    1.  Breast cancer: CA 27-29 continues to be within normal limits.  PET and MRI results reviewed independently. Patient has no obvious evidence of recurrence.  Continue letrozole, which she will require indefinitely or until significant progression of disease. Return to clinic in 3 months with repeat laboratory work further evaluation. 2.  Hip/back pain: Improved with right hip replacement surgery. Patient reports she may require a left hip replacement in the future.  3.  Diarrhea: Resolved. Continue Imodium as needed.  4.  Nausea: Phenergan, take as needed.   Patient expressed understanding and was in agreement with this plan. She also understands that She can call clinic at any time with any questions, concerns, or complaints.   Lloyd Huger, MD   10/28/2015 6:27 AM

## 2015-11-14 ENCOUNTER — Other Ambulatory Visit: Payer: Self-pay | Admitting: Hematology and Oncology

## 2015-11-22 ENCOUNTER — Ambulatory Visit: Payer: Medicare Other | Admitting: Oncology

## 2015-11-22 ENCOUNTER — Inpatient Hospital Stay (HOSPITAL_BASED_OUTPATIENT_CLINIC_OR_DEPARTMENT_OTHER): Payer: Medicare Other | Admitting: Family Medicine

## 2015-11-22 ENCOUNTER — Inpatient Hospital Stay: Payer: Medicare Other | Attending: Oncology

## 2015-11-22 VITALS — BP 113/67 | HR 65 | Temp 97.5°F | Wt 143.5 lb

## 2015-11-22 DIAGNOSIS — C7951 Secondary malignant neoplasm of bone: Secondary | ICD-10-CM

## 2015-11-22 DIAGNOSIS — Z170 Estrogen receptor positive status [ER+]: Secondary | ICD-10-CM | POA: Insufficient documentation

## 2015-11-22 DIAGNOSIS — C50911 Malignant neoplasm of unspecified site of right female breast: Secondary | ICD-10-CM | POA: Diagnosis present

## 2015-11-22 DIAGNOSIS — F17210 Nicotine dependence, cigarettes, uncomplicated: Secondary | ICD-10-CM | POA: Diagnosis not present

## 2015-11-22 DIAGNOSIS — C7889 Secondary malignant neoplasm of other digestive organs: Secondary | ICD-10-CM | POA: Diagnosis not present

## 2015-11-22 DIAGNOSIS — I10 Essential (primary) hypertension: Secondary | ICD-10-CM | POA: Diagnosis not present

## 2015-11-22 DIAGNOSIS — M549 Dorsalgia, unspecified: Secondary | ICD-10-CM | POA: Diagnosis not present

## 2015-11-22 DIAGNOSIS — C784 Secondary malignant neoplasm of small intestine: Secondary | ICD-10-CM

## 2015-11-22 DIAGNOSIS — C50919 Malignant neoplasm of unspecified site of unspecified female breast: Secondary | ICD-10-CM

## 2015-11-22 DIAGNOSIS — R110 Nausea: Secondary | ICD-10-CM | POA: Insufficient documentation

## 2015-11-22 DIAGNOSIS — M2550 Pain in unspecified joint: Secondary | ICD-10-CM | POA: Insufficient documentation

## 2015-11-22 DIAGNOSIS — Z79811 Long term (current) use of aromatase inhibitors: Secondary | ICD-10-CM | POA: Insufficient documentation

## 2015-11-22 MED ORDER — HYDROCODONE-ACETAMINOPHEN 5-325 MG PO TABS: 1.0000 | ORAL_TABLET | Freq: Four times a day (QID) | ORAL | Status: DC | PRN

## 2015-11-22 NOTE — Progress Notes (Signed)
[place]  [phone]) [phone] [phone]) [phone]  ID: Sara David OB: [birth year]  MR#: [id number]  [id number]  [name], [name]  CHIEF COMPLAINT:  Chief Complaint  Patient presents with  . Breast Cancer  . Follow-up    INTERVAL HISTORY: Patient returns to clinic today for further evaluation and laboratory work. Her hip pain has significantly improved but still evident after her recent hip replacement surgery. She otherwise feels well.  She has no neurologic complaints.  She denies any chest pain or shortness of breath.  She has a fair appetite and denies weight loss. She denies vomiting or constipation.  She has no melena or hematochezia.  She has no urinary complaints.  Patient offers no further specific complaints today.   REVIEW OF SYSTEMS:   Review of Systems  Constitutional: Negative.  Negative for fever, chills, weight loss, malaise/fatigue and diaphoresis.  HENT: Negative.   Eyes: Negative.   Respiratory: Negative.  Negative for cough, hemoptysis, sputum production, shortness of breath and wheezing.   Cardiovascular: Negative.  Negative for chest pain, palpitations, orthopnea, claudication, leg swelling and PND.  Gastrointestinal: Negative.  Negative for heartburn, nausea, vomiting, abdominal pain, diarrhea, constipation, blood in stool and melena.  Genitourinary: Negative.   Musculoskeletal: Positive for back pain and joint pain.  Skin: Negative.   Neurological: Negative.  Negative for dizziness, tingling, sensory change, focal weakness, seizures and weakness.  Endo/Heme/Allergies: Does not bruise/bleed easily.  Psychiatric/Behavioral: Negative for depression. The patient is not nervous/anxious and does not have insomnia.     As per HPI. Otherwise, a complete review of systems is negatve.  PAST MEDICAL HISTORY: Past Medical History  Diagnosis Date  . Hypertension   . Family  history of adverse reaction to anesthesia     daughter gets nauseated  . Cancer ([place])     small intestines 2012, gallbladder 2016  . Breast cancer ([place]) 2012     no lumpectomy. Treating with meds    PAST SURGICAL HISTORY: Past Surgical History  Procedure Laterality Date  . Back surgery    . Cholecystectomy    . Breast excisional biopsy Right 1995    benign  . Abdominal hysterectomy    . Breast biopsy Right 2012    positive  . Breast biopsy Right 2014    positive  . Total hip arthroplasty Right 10/03/2015    Procedure: TOTAL HIP ARTHROPLASTY ANTERIOR APPROACH;  Surgeon: Hessie Knows, MD;  Location: [facility] ORS;  Service: Orthopedics;  Laterality: Right;    FAMILY HISTORY: Reviewed and unchanged. No reported history of breast cancer or other chronic diseases.     ADVANCED DIRECTIVES:    HEALTH MAINTENANCE: Social History  Substance Use Topics  . Smoking status: Current Every Day Smoker -- 0.50 packs/day    Types: Cigarettes  . Smokeless tobacco: Not on file  . Alcohol Use: No     Colonoscopy:  PAP:  Bone density:  Lipid panel:  Allergies  Allergen Reactions  . Diclofenac Sodium Nausea Only  . Erythromycin Diarrhea and Nausea And Vomiting  . Tape Itching and Rash  . Wellbutrin [Bupropion] Rash    Current Outpatient Prescriptions  Medication Sig Dispense Refill  . acetaminophen (TYLENOL) 500 MG tablet Take 500-1,000 mg by mouth every 6 (six) hours as needed for mild pain or headache.    . cetirizine (ZYRTEC) 10 MG tablet Take 10 mg by mouth daily. Reported on 10/03/2015    . citalopram (  CELEXA) 20 MG tablet Take 20 mg by mouth every morning.     . dicyclomine (BENTYL) 10 MG capsule Reported on 10/03/2015  2  . enoxaparin (LOVENOX) 40 MG/0.4ML injection Inject 0.4 mLs (40 mg total) into the skin daily. 40 Syringe 0  . Fluticasone-Salmeterol (ADVAIR) 100-50 MCG/DOSE AEPB Inhale 1 puff into the lungs every morning.     Marland Kitchen HYDROcodone-acetaminophen (NORCO/VICODIN) 5-325  MG tablet Take 1 tablet by mouth every 6 (six) hours as needed for moderate pain. 30 tablet 0  . letrozole (FEMARA) 2.5 MG tablet TAKE 1 TABLET ONE TIME DAILY (Patient taking differently: TAKE 1 TABLET ONE TIME DAILY in am) 90 tablet 4  . levothyroxine (SYNTHROID, LEVOTHROID) 100 MCG tablet Take 100 mcg by mouth daily before breakfast.     . meloxicam (MOBIC) 7.5 MG tablet Take 7.5 mg by mouth every morning. Reported on 10/17/2015    . montelukast (SINGULAIR) 10 MG tablet Take 10 mg by mouth at bedtime.     . Multiple Vitamins-Minerals (CENTRUM SILVER PO) Take 1 tablet by mouth every morning.    Marland Kitchen omeprazole (PRILOSEC) 20 MG capsule Take 20 mg by mouth every morning.     Marland Kitchen oxyCODONE (OXY IR/ROXICODONE) 5 MG immediate release tablet Take 1-2 tablets (5-10 mg total) by mouth every 3 (three) hours as needed for breakthrough pain. 30 tablet 0  . pravastatin (PRAVACHOL) 40 MG tablet Take 40 mg by mouth at bedtime.     . promethazine (PHENERGAN) 25 MG tablet TAKE 1 TABLET (25 MG TOTAL) BY MOUTH EVERY 6 (SIX) HOURS AS NEEDED FOR NAUSEA OR VOMITING.  1  . sucralfate (CARAFATE) 1 g tablet TAKE 1 TABLET (1 G TOTAL) BY MOUTH 3 (THREE) TIMES DAILY.  3  . valsartan-hydrochlorothiazide (DIOVAN-HCT) 80-12.5 MG per tablet Take 1 tablet by mouth every morning.     . zolpidem (AMBIEN) 10 MG tablet TAKE 1 TABLET BY MOUTH AT BEDTIME AS NEEDED FOR SLEEP 30 tablet 0  . Docusate Calcium (STOOL SOFTENER PO) Take 1 capsule by mouth every morning.    Marland Kitchen HYDROcodone-acetaminophen (NORCO/VICODIN) 5-325 MG tablet Take 1 tablet by mouth every 12 (twelve) hours as needed.  0   No current facility-administered medications for this visit.    OBJECTIVE: Filed Vitals:   11/22/15 1052  BP: 113/67  Pulse: 65  Temp: 97.5 F (36.4 C)     Body mass index is 24.62 kg/(m^2).    ECOG FS:1 - Symptomatic but completely ambulatory  General: Well-developed, well-nourished, no acute distress. Eyes: Pink conjunctiva, anicteric  sclera. Lungs: Clear to auscultation bilaterally. Heart: Regular rate and rhythm. No rubs, murmurs, or gallops. Abdomen: Soft, nontender, nondistended. No organomegaly noted, normoactive bowel sounds. Musculoskeletal: No edema, cyanosis, or clubbing.  Well-healing surgical scar on lumbar spine without erythema. Neuro: Alert, answering all questions appropriately. Cranial nerves grossly intact. Skin: No rashes or petechiae noted. Psych: Normal affect.   LAB RESULTS:  Lab Results  Component Value Date   NA 134* 10/17/2015   K 4.1 10/17/2015   CL 102 10/17/2015   CO2 25 10/17/2015   GLUCOSE 107* 10/17/2015   BUN 19 10/17/2015   CREATININE 0.95 10/17/2015   CALCIUM 8.7* 10/17/2015   PROT 7.2 08/23/2015   ALBUMIN 4.0 08/23/2015   AST 19 08/23/2015   ALT 20 08/23/2015   ALKPHOS 66 08/23/2015   BILITOT 0.4 08/23/2015   GFRNONAA 60* 10/17/2015   GFRAA >60 10/17/2015    Lab Results  Component Value Date   WBC  6.8 10/17/2015   NEUTROABS 5.4 10/17/2015   HGB 10.0* 10/17/2015   HCT 29.0* 10/17/2015   MCV 93.9 10/17/2015   PLT 377 10/17/2015   Lab Results  Component Value Date   LABCA2 14.8 10/17/2015     STUDIES: No results found.  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  PLAN:    1.  Breast cancer: CA 27-29 Is pending from today but previously in February 2017 was normal 14.8.  Clinically there is no obvious evidence of recurrence.  Continue letrozole, which she will require indefinitely or until significant progression of disease. Return to clinic in 3 months with repeat laboratory work further evaluation. 2.  Hip/back pain: Improved with right hip replacement surgery. Patient reports she may require a left hip replacement in the future.  3.  Diarrhea: Resolved. Continue Imodium as needed.  4.  Nausea: Phenergan, take as needed.   Patient expressed understanding and was in agreement with this  plan. She also understands that She can call clinic at any time with any questions, concerns, or complaints.  Dr. Grayland Ormond was available for consultation and review of plan of care for this patient.  Evlyn Kanner, NP   11/22/2015 2:56 PM

## 2015-11-22 NOTE — Progress Notes (Signed)
Patient here for follow up

## 2015-11-23 LAB — CANCER ANTIGEN 27.29: CA 27.29: 25.5 U/mL (ref 0.0–38.6)

## 2015-12-17 ENCOUNTER — Other Ambulatory Visit: Payer: Self-pay | Admitting: Hematology and Oncology

## 2016-01-16 ENCOUNTER — Other Ambulatory Visit: Payer: Medicare Other

## 2016-01-16 ENCOUNTER — Ambulatory Visit: Payer: Medicare Other | Admitting: Oncology

## 2016-01-19 ENCOUNTER — Other Ambulatory Visit: Payer: Self-pay | Admitting: *Deleted

## 2016-01-19 MED ORDER — ZOLPIDEM TARTRATE 10 MG PO TABS: 10.0000 mg | ORAL_TABLET | Freq: Every evening | ORAL | Status: DC | PRN

## 2016-02-15 ENCOUNTER — Other Ambulatory Visit: Payer: Self-pay | Admitting: Oncology

## 2016-02-26 ENCOUNTER — Inpatient Hospital Stay: Payer: Medicare Other

## 2016-02-26 ENCOUNTER — Inpatient Hospital Stay: Payer: Medicare Other | Attending: Oncology | Admitting: Oncology

## 2016-02-26 VITALS — BP 105/65 | HR 62 | Wt 145.9 lb

## 2016-02-26 DIAGNOSIS — Z79811 Long term (current) use of aromatase inhibitors: Secondary | ICD-10-CM | POA: Insufficient documentation

## 2016-02-26 DIAGNOSIS — C784 Secondary malignant neoplasm of small intestine: Secondary | ICD-10-CM | POA: Diagnosis not present

## 2016-02-26 DIAGNOSIS — M25559 Pain in unspecified hip: Secondary | ICD-10-CM

## 2016-02-26 DIAGNOSIS — R531 Weakness: Secondary | ICD-10-CM | POA: Diagnosis not present

## 2016-02-26 DIAGNOSIS — Z79899 Other long term (current) drug therapy: Secondary | ICD-10-CM | POA: Diagnosis not present

## 2016-02-26 DIAGNOSIS — C50919 Malignant neoplasm of unspecified site of unspecified female breast: Secondary | ICD-10-CM

## 2016-02-26 DIAGNOSIS — R110 Nausea: Secondary | ICD-10-CM | POA: Diagnosis not present

## 2016-02-26 DIAGNOSIS — M549 Dorsalgia, unspecified: Secondary | ICD-10-CM

## 2016-02-26 DIAGNOSIS — C170 Malignant neoplasm of duodenum: Secondary | ICD-10-CM | POA: Diagnosis not present

## 2016-02-26 DIAGNOSIS — C50911 Malignant neoplasm of unspecified site of right female breast: Secondary | ICD-10-CM

## 2016-02-26 DIAGNOSIS — C7889 Secondary malignant neoplasm of other digestive organs: Secondary | ICD-10-CM

## 2016-02-26 DIAGNOSIS — M2550 Pain in unspecified joint: Secondary | ICD-10-CM | POA: Insufficient documentation

## 2016-02-26 DIAGNOSIS — I10 Essential (primary) hypertension: Secondary | ICD-10-CM | POA: Insufficient documentation

## 2016-02-26 DIAGNOSIS — R5383 Other fatigue: Secondary | ICD-10-CM | POA: Diagnosis not present

## 2016-02-26 DIAGNOSIS — C50411 Malignant neoplasm of upper-outer quadrant of right female breast: Secondary | ICD-10-CM | POA: Diagnosis present

## 2016-02-26 DIAGNOSIS — F17210 Nicotine dependence, cigarettes, uncomplicated: Secondary | ICD-10-CM | POA: Insufficient documentation

## 2016-02-26 DIAGNOSIS — C7951 Secondary malignant neoplasm of bone: Secondary | ICD-10-CM | POA: Diagnosis not present

## 2016-02-26 LAB — CBC WITH DIFFERENTIAL/PLATELET
Basophils Absolute: 0 10*3/uL (ref 0–0.1)
Basophils Relative: 1 %
EOS ABS: 0.1 10*3/uL (ref 0–0.7)
EOS PCT: 1 %
HCT: 36.5 % (ref 35.0–47.0)
Hemoglobin: 12.5 g/dL (ref 12.0–16.0)
LYMPHS ABS: 0.8 10*3/uL — AB (ref 1.0–3.6)
Lymphocytes Relative: 14 %
MCH: 31.2 pg (ref 26.0–34.0)
MCHC: 34.2 g/dL (ref 32.0–36.0)
MCV: 91.3 fL (ref 80.0–100.0)
Monocytes Absolute: 0.4 10*3/uL (ref 0.2–0.9)
Monocytes Relative: 7 %
Neutro Abs: 4.4 10*3/uL (ref 1.4–6.5)
Neutrophils Relative %: 77 %
PLATELETS: 285 10*3/uL (ref 150–440)
RBC: 4 MIL/uL (ref 3.80–5.20)
RDW: 14.1 % (ref 11.5–14.5)
WBC: 5.7 10*3/uL (ref 3.6–11.0)

## 2016-02-26 LAB — COMPREHENSIVE METABOLIC PANEL
ALT: 17 U/L (ref 14–54)
ANION GAP: 7 (ref 5–15)
AST: 22 U/L (ref 15–41)
Albumin: 4.1 g/dL (ref 3.5–5.0)
Alkaline Phosphatase: 97 U/L (ref 38–126)
BUN: 23 mg/dL — ABNORMAL HIGH (ref 6–20)
CHLORIDE: 104 mmol/L (ref 101–111)
CO2: 26 mmol/L (ref 22–32)
Calcium: 9.2 mg/dL (ref 8.9–10.3)
Creatinine, Ser: 0.92 mg/dL (ref 0.44–1.00)
GFR calc non Af Amer: >60 mL/min (ref >60)
Glucose, Bld: 89 mg/dL (ref 65–99)
Potassium: 3.8 mmol/L (ref 3.5–5.1)
SODIUM: 137 mmol/L (ref 135–145)
Total Bilirubin: 0.4 mg/dL (ref 0.3–1.2)
Total Protein: 7.1 g/dL (ref 6.5–8.1)

## 2016-02-26 NOTE — Progress Notes (Signed)
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  INTERVAL HISTORY: Patient returns to clinic today for routine 3 month follow-up. She continues to have significant back pain and now is having left hip and leg pain. She had a right hip replacement earlier this year and is now concern she may need a left hip replacement. She otherwise feels well.  She has no neurologic complaints.  She denies any chest pain or shortness of breath.  She has a fair appetite and denies weight loss. She denies vomiting or constipation.  She has no melena or hematochezia.  She has no urinary complaints.  Patient offers no further specific complaints today.   REVIEW OF SYSTEMS:   Review of Systems  Constitutional: Positive for malaise/fatigue. Negative for fever and weight loss.  Respiratory: Negative.  Negative for cough and shortness of breath.   Cardiovascular: Negative.  Negative for chest pain.  Gastrointestinal: Negative for abdominal pain, blood in stool and melena.  Genitourinary: Negative.   Musculoskeletal: Positive for back pain and joint pain.  Neurological: Positive for weakness.  Psychiatric/Behavioral: Negative.     As per HPI. Otherwise, a complete review of systems is negatve.  PAST MEDICAL HISTORY: Past Medical History  Diagnosis Date  . Hypertension   . Family history of adverse reaction to anesthesia     daughter gets nauseated  . Cancer ([place])     small intestines 2012, gallbladder 2016  . Breast cancer ([place]) 2012     no lumpectomy. Treating with meds    PAST SURGICAL HISTORY: Past Surgical History  Procedure Laterality Date  . Back surgery    . Cholecystectomy    .  Breast excisional biopsy Right 1995    benign  . Abdominal hysterectomy    . Breast biopsy Right 2012    positive  . Breast biopsy Right 2014    positive  . Total hip arthroplasty Right 10/03/2015    Procedure: TOTAL HIP ARTHROPLASTY ANTERIOR APPROACH;  Surgeon: Hessie Knows, MD;  Location: [facility] ORS;  Service: Orthopedics;  Laterality: Right;    FAMILY HISTORY: Reviewed and unchanged. No reported history of breast cancer or other chronic diseases.     ADVANCED DIRECTIVES:    HEALTH MAINTENANCE: Social History  Substance Use Topics  . Smoking status: Current Every Day Smoker -- 0.50 packs/day    Types: Cigarettes  . Smokeless tobacco: Not on file  . Alcohol Use: No     Colonoscopy:  PAP:  Bone density:  Lipid panel:  Allergies  Allergen Reactions  . Diclofenac Sodium Nausea Only  . Erythromycin Diarrhea and Nausea And Vomiting  . Tape Itching and Rash  . Wellbutrin [Bupropion] Rash    Current Outpatient Prescriptions  Medication Sig Dispense Refill  . acetaminophen (TYLENOL) 500 MG tablet Take 500-1,000 mg by mouth every 6 (six) hours as needed for mild pain or headache.    . cetirizine (ZYRTEC) 10 MG tablet Take 10 mg by mouth daily. Reported on 10/03/2015    . citalopram (CELEXA) 20 MG tablet Take 20 mg by mouth every morning.     Mariane Baumgarten Calcium (STOOL SOFTENER PO) Take 1 capsule by mouth every morning.    . enoxaparin (LOVENOX) 40 MG/0.4ML injection  Inject 0.4 mLs (40 mg total) into the skin daily. 40 Syringe 0  . Fluticasone-Salmeterol (ADVAIR) 100-50 MCG/DOSE AEPB Inhale 1 puff into the lungs every morning.     Marland Kitchen HYDROcodone-acetaminophen (NORCO/VICODIN) 5-325 MG tablet Take 1 tablet by mouth every 12 (twelve) hours as needed.  0  . HYDROcodone-acetaminophen (NORCO/VICODIN) 5-325 MG tablet Take 1 tablet by mouth every 6 (six) hours as needed for moderate pain. 30 tablet 0  . letrozole (FEMARA) 2.5 MG tablet TAKE 1 TABLET ONE TIME DAILY (Patient taking  differently: TAKE 1 TABLET ONE TIME DAILY in am) 90 tablet 4  . levothyroxine (SYNTHROID, LEVOTHROID) 100 MCG tablet Take 100 mcg by mouth daily before breakfast.     . montelukast (SINGULAIR) 10 MG tablet Take 10 mg by mouth at bedtime.     . Multiple Vitamins-Minerals (CENTRUM SILVER PO) Take 1 tablet by mouth every morning.    Marland Kitchen omeprazole (PRILOSEC) 20 MG capsule Take 20 mg by mouth every morning.     Marland Kitchen oxyCODONE (OXY IR/ROXICODONE) 5 MG immediate release tablet Take 1-2 tablets (5-10 mg total) by mouth every 3 (three) hours as needed for breakthrough pain. 30 tablet 0  . pravastatin (PRAVACHOL) 40 MG tablet Take 40 mg by mouth at bedtime.     . promethazine (PHENERGAN) 25 MG tablet TAKE 1 TABLET (25 MG TOTAL) BY MOUTH EVERY 6 (SIX) HOURS AS NEEDED FOR NAUSEA OR VOMITING.  1  . sucralfate (CARAFATE) 1 g tablet TAKE 1 TABLET (1 G TOTAL) BY MOUTH 3 (THREE) TIMES DAILY.  3  . valsartan-hydrochlorothiazide (DIOVAN-HCT) 80-12.5 MG per tablet Take 1 tablet by mouth every morning.     . zolpidem (AMBIEN) 10 MG tablet TAKE 1 TABLET BY MOUTH AT BEDTIME AS NEEDED FOR SLEEP 30 tablet 0   No current facility-administered medications for this visit.    OBJECTIVE: Filed Vitals:   02/26/16 1048  BP: 105/65  Pulse: 62     Body mass index is 25.04 kg/(m^2).    ECOG FS:1 - Symptomatic but completely ambulatory  General: Well-developed, well-nourished, no acute distress. Eyes: Pink conjunctiva, anicteric sclera. Breasts: Patient requested exam be deferred today. Lungs: Clear to auscultation bilaterally. Heart: Regular rate and rhythm. No rubs, murmurs, or gallops. Abdomen: Soft, nontender, nondistended. No organomegaly noted, normoactive bowel sounds. Musculoskeletal: No edema, cyanosis, or clubbing.  Well-healing surgical scar on lumbar spine without erythema. Neuro: Alert, answering all questions appropriately. Cranial nerves grossly intact. Skin: No rashes or petechiae noted. Psych: Normal  affect.   LAB RESULTS:  Lab Results  Component Value Date   NA 134* 10/17/2015   K 4.1 10/17/2015   CL 102 10/17/2015   CO2 25 10/17/2015   GLUCOSE 107* 10/17/2015   BUN 19 10/17/2015   CREATININE 0.95 10/17/2015   CALCIUM 8.7* 10/17/2015   PROT 7.2 08/23/2015   ALBUMIN 4.0 08/23/2015   AST 19 08/23/2015   ALT 20 08/23/2015   ALKPHOS 66 08/23/2015   BILITOT 0.4 08/23/2015   GFRNONAA 60* 10/17/2015   GFRAA >60 10/17/2015    Lab Results  Component Value Date   WBC 5.7 02/26/2016   NEUTROABS 4.4 02/26/2016   HGB 12.5 02/26/2016   HCT 36.5 02/26/2016   MCV 91.3 02/26/2016   PLT 285 02/26/2016   Lab Results  Component Value Date   LABCA2 25.5 11/22/2015     STUDIES: No results found.  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  PLAN:    1.  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  Clinically there is no obvious evidence of recurrence.  Continue letrozole, which she will require indefinitely or until significant progression of disease. Return to clinic in 3 months with repeat laboratory work further evaluation. 2.  Hip/back pain: Patient now complains of left hip pain which she indicates she may need replacement of the future. She has a history of a right hip replacement. Treatment per orthopedics.  3.  Diarrhea: Resolved. Continue Imodium as needed.  4.  Nausea: Phenergan, take as needed.  Patient expressed understanding and was in agreement with this plan. She also understands that She can call clinic at any time with any questions, concerns, or complaints.    Lloyd Huger, MD   02/26/2016 11:04 AM

## 2016-02-26 NOTE — Progress Notes (Signed)
Patient ambulates without assistance, brought to exam room 7.  Patient denies pain or discomfort at this time.  BP 105/65, vitals documented.  Medication record update, information provided by patient.

## 2016-02-27 LAB — CANCER ANTIGEN 27.29: CA 27.29: 32.1 U/mL (ref 0.0–38.6)

## 2016-03-20 ENCOUNTER — Other Ambulatory Visit: Payer: Self-pay

## 2016-03-20 ENCOUNTER — Telehealth: Payer: Self-pay

## 2016-03-20 DIAGNOSIS — C801 Malignant (primary) neoplasm, unspecified: Secondary | ICD-10-CM

## 2016-03-20 MED ORDER — ZOLPIDEM TARTRATE 10 MG PO TABS: 10.0000 mg | ORAL_TABLET | Freq: Every evening | ORAL | 0 refills | Status: DC | PRN

## 2016-03-20 NOTE — Telephone Encounter (Signed)
Ambien refill, Rx faxed

## 2016-04-01 DIAGNOSIS — M1990 Unspecified osteoarthritis, unspecified site: Secondary | ICD-10-CM | POA: Insufficient documentation

## 2016-04-02 ENCOUNTER — Inpatient Hospital Stay (HOSPITAL_COMMUNITY)
Admission: EM | Admit: 2016-04-02 | Discharge: 2016-04-11 | DRG: 540 | Disposition: A | Discharge status: Home Health | Payer: Medicare Other | Attending: Internal Medicine | Admitting: Internal Medicine

## 2016-04-02 ENCOUNTER — Emergency Department (HOSPITAL_COMMUNITY): Payer: Medicare Other

## 2016-04-02 ENCOUNTER — Encounter (HOSPITAL_COMMUNITY): Payer: Self-pay | Admitting: Emergency Medicine

## 2016-04-02 DIAGNOSIS — Z79899 Other long term (current) drug therapy: Secondary | ICD-10-CM

## 2016-04-02 DIAGNOSIS — Z90710 Acquired absence of both cervix and uterus: Secondary | ICD-10-CM

## 2016-04-02 DIAGNOSIS — C7951 Secondary malignant neoplasm of bone: Secondary | ICD-10-CM | POA: Diagnosis present

## 2016-04-02 DIAGNOSIS — M549 Dorsalgia, unspecified: Secondary | ICD-10-CM

## 2016-04-02 DIAGNOSIS — Z23 Encounter for immunization: Secondary | ICD-10-CM

## 2016-04-02 DIAGNOSIS — Z79811 Long term (current) use of aromatase inhibitors: Secondary | ICD-10-CM

## 2016-04-02 DIAGNOSIS — M545 Low back pain, unspecified: Secondary | ICD-10-CM | POA: Diagnosis present

## 2016-04-02 DIAGNOSIS — K5900 Constipation, unspecified: Secondary | ICD-10-CM | POA: Diagnosis present

## 2016-04-02 DIAGNOSIS — C50411 Malignant neoplasm of upper-outer quadrant of right female breast: Secondary | ICD-10-CM | POA: Diagnosis present

## 2016-04-02 DIAGNOSIS — M4646 Discitis, unspecified, lumbar region: Secondary | ICD-10-CM | POA: Diagnosis present

## 2016-04-02 DIAGNOSIS — Z96641 Presence of right artificial hip joint: Secondary | ICD-10-CM | POA: Diagnosis present

## 2016-04-02 DIAGNOSIS — Z85828 Personal history of other malignant neoplasm of skin: Secondary | ICD-10-CM

## 2016-04-02 DIAGNOSIS — K219 Gastro-esophageal reflux disease without esophagitis: Secondary | ICD-10-CM | POA: Diagnosis not present

## 2016-04-02 DIAGNOSIS — N179 Acute kidney failure, unspecified: Secondary | ICD-10-CM | POA: Diagnosis present

## 2016-04-02 DIAGNOSIS — M25552 Pain in left hip: Secondary | ICD-10-CM

## 2016-04-02 DIAGNOSIS — Z7901 Long term (current) use of anticoagulants: Secondary | ICD-10-CM

## 2016-04-02 DIAGNOSIS — C7889 Secondary malignant neoplasm of other digestive organs: Secondary | ICD-10-CM | POA: Diagnosis present

## 2016-04-02 DIAGNOSIS — M4626 Osteomyelitis of vertebra, lumbar region: Secondary | ICD-10-CM | POA: Diagnosis not present

## 2016-04-02 DIAGNOSIS — E7800 Pure hypercholesterolemia, unspecified: Secondary | ICD-10-CM | POA: Diagnosis present

## 2016-04-02 DIAGNOSIS — J449 Chronic obstructive pulmonary disease, unspecified: Secondary | ICD-10-CM | POA: Diagnosis present

## 2016-04-02 DIAGNOSIS — Z66 Do not resuscitate: Secondary | ICD-10-CM | POA: Diagnosis present

## 2016-04-02 DIAGNOSIS — Z886 Allergy status to analgesic agent status: Secondary | ICD-10-CM

## 2016-04-02 DIAGNOSIS — C50911 Malignant neoplasm of unspecified site of right female breast: Secondary | ICD-10-CM

## 2016-04-02 DIAGNOSIS — Z91048 Other nonmedicinal substance allergy status: Secondary | ICD-10-CM

## 2016-04-02 DIAGNOSIS — Z888 Allergy status to other drugs, medicaments and biological substances status: Secondary | ICD-10-CM

## 2016-04-02 DIAGNOSIS — C50919 Malignant neoplasm of unspecified site of unspecified female breast: Secondary | ICD-10-CM | POA: Diagnosis present

## 2016-04-02 DIAGNOSIS — R410 Disorientation, unspecified: Secondary | ICD-10-CM | POA: Diagnosis not present

## 2016-04-02 DIAGNOSIS — F17210 Nicotine dependence, cigarettes, uncomplicated: Secondary | ICD-10-CM | POA: Diagnosis present

## 2016-04-02 DIAGNOSIS — Z7951 Long term (current) use of inhaled steroids: Secondary | ICD-10-CM

## 2016-04-02 DIAGNOSIS — C784 Secondary malignant neoplasm of small intestine: Secondary | ICD-10-CM | POA: Diagnosis present

## 2016-04-02 DIAGNOSIS — I10 Essential (primary) hypertension: Secondary | ICD-10-CM | POA: Diagnosis present

## 2016-04-02 DIAGNOSIS — E039 Hypothyroidism, unspecified: Secondary | ICD-10-CM | POA: Diagnosis present

## 2016-04-02 DIAGNOSIS — D6489 Other specified anemias: Secondary | ICD-10-CM | POA: Diagnosis present

## 2016-04-02 DIAGNOSIS — M4647 Discitis, unspecified, lumbosacral region: Secondary | ICD-10-CM

## 2016-04-02 DIAGNOSIS — Z881 Allergy status to other antibiotic agents status: Secondary | ICD-10-CM

## 2016-04-02 DIAGNOSIS — E876 Hypokalemia: Secondary | ICD-10-CM | POA: Diagnosis present

## 2016-04-02 DIAGNOSIS — R509 Fever, unspecified: Secondary | ICD-10-CM | POA: Diagnosis present

## 2016-04-02 HISTORY — DX: Low back pain: M54.5

## 2016-04-02 HISTORY — DX: Unspecified osteoarthritis, unspecified site: M19.90

## 2016-04-02 HISTORY — DX: Low back pain, unspecified: M54.50

## 2016-04-02 HISTORY — DX: Unspecified malignant neoplasm of skin, unspecified: C44.90

## 2016-04-02 HISTORY — DX: Hypothyroidism, unspecified: E03.9

## 2016-04-02 HISTORY — DX: Chronic obstructive pulmonary disease, unspecified: J44.9

## 2016-04-02 HISTORY — DX: Pure hypercholesterolemia, unspecified: E78.00

## 2016-04-02 HISTORY — DX: Other chronic pain: G89.29

## 2016-04-02 HISTORY — DX: Gastro-esophageal reflux disease without esophagitis: K21.9

## 2016-04-02 LAB — CBC
HEMATOCRIT: 34.8 % — AB (ref 36.0–46.0)
Hemoglobin: 10.8 g/dL — ABNORMAL LOW (ref 12.0–15.0)
MCH: 30 pg (ref 26.0–34.0)
MCHC: 31 g/dL (ref 30.0–36.0)
MCV: 96.7 fL (ref 78.0–100.0)
Platelets: 176 10*3/uL (ref 150–400)
RBC: 3.6 MIL/uL — ABNORMAL LOW (ref 3.87–5.11)
RDW: 13.6 % (ref 11.5–15.5)
WBC: 5 10*3/uL (ref 4.0–10.5)

## 2016-04-02 LAB — BASIC METABOLIC PANEL
Anion gap: 8 (ref 5–15)
BUN: 14 mg/dL (ref 6–20)
CALCIUM: 9.2 mg/dL (ref 8.9–10.3)
CO2: 26 mmol/L (ref 22–32)
CREATININE: 1.09 mg/dL — AB (ref 0.44–1.00)
Chloride: 103 mmol/L (ref 101–111)
GFR calc non Af Amer: 50 mL/min — ABNORMAL LOW (ref >60)
GFR, EST AFRICAN AMERICAN: 58 mL/min — AB (ref >60)
GLUCOSE: 130 mg/dL — AB (ref 65–99)
Potassium: 3.2 mmol/L — ABNORMAL LOW (ref 3.5–5.1)
Sodium: 137 mmol/L (ref 135–145)

## 2016-04-02 LAB — SEDIMENTATION RATE: Sed Rate: 60 mm/hr — ABNORMAL HIGH (ref 0–22)

## 2016-04-02 LAB — C-REACTIVE PROTEIN: CRP: 9.4 mg/dL — ABNORMAL HIGH (ref <1.0)

## 2016-04-02 MED ORDER — SODIUM CHLORIDE 0.9 % IV SOLN
INTRAVENOUS | Status: DC
  Administered 2016-04-02 – 2016-04-09 (×10): via INTRAVENOUS

## 2016-04-02 MED ORDER — NICOTINE 21 MG/24HR TD PT24
21.0000 mg | MEDICATED_PATCH | Freq: Every day | TRANSDERMAL | Status: DC
  Administered 2016-04-02 – 2016-04-11 (×10): 21 mg via TRANSDERMAL
  Filled 2016-04-02 (×10): qty 1

## 2016-04-02 MED ORDER — SUCRALFATE 1 G PO TABS
1.0000 g | ORAL_TABLET | Freq: Three times a day (TID) | ORAL | Status: DC
  Administered 2016-04-02 – 2016-04-05 (×5): 1 g via ORAL
  Filled 2016-04-02 (×12): qty 1

## 2016-04-02 MED ORDER — LEVOTHYROXINE SODIUM 100 MCG PO TABS
100.0000 ug | ORAL_TABLET | Freq: Every day | ORAL | Status: DC
  Administered 2016-04-03 – 2016-04-11 (×9): 100 ug via ORAL
  Filled 2016-04-02 (×9): qty 1

## 2016-04-02 MED ORDER — PANTOPRAZOLE SODIUM 40 MG PO TBEC
40.0000 mg | DELAYED_RELEASE_TABLET | Freq: Every day | ORAL | Status: DC
  Administered 2016-04-02 – 2016-04-11 (×10): 40 mg via ORAL
  Filled 2016-04-02 (×10): qty 1

## 2016-04-02 MED ORDER — ONDANSETRON HCL 4 MG/2ML IJ SOLN: 4.0000 mg | Freq: Four times a day (QID) | INTRAMUSCULAR | Status: DC | PRN

## 2016-04-02 MED ORDER — ACETAMINOPHEN 650 MG RE SUPP: 650.0000 mg | Freq: Four times a day (QID) | RECTAL | Status: DC | PRN

## 2016-04-02 MED ORDER — FENTANYL CITRATE (PF) 100 MCG/2ML IJ SOLN
50.0000 ug | INTRAMUSCULAR | Status: DC | PRN
  Administered 2016-04-02 (×2): 50 ug via INTRAVENOUS
  Filled 2016-04-02 (×2): qty 2

## 2016-04-02 MED ORDER — LORATADINE 10 MG PO TABS
10.0000 mg | ORAL_TABLET | Freq: Every day | ORAL | Status: DC
  Administered 2016-04-04 – 2016-04-11 (×6): 10 mg via ORAL
  Filled 2016-04-02 (×8): qty 1

## 2016-04-02 MED ORDER — POLYETHYLENE GLYCOL 3350 17 G PO PACK
17.0000 g | PACK | Freq: Every day | ORAL | Status: DC | PRN
  Administered 2016-04-04: 17 g via ORAL
  Filled 2016-04-02: qty 1

## 2016-04-02 MED ORDER — SODIUM CHLORIDE 0.9 % IV SOLN: Freq: Once | INTRAVENOUS | Status: DC

## 2016-04-02 MED ORDER — ACETAMINOPHEN 325 MG PO TABS: 650.0000 mg | ORAL_TABLET | Freq: Four times a day (QID) | ORAL | Status: DC | PRN

## 2016-04-02 MED ORDER — METHOCARBAMOL 1000 MG/10ML IJ SOLN
500.0000 mg | Freq: Four times a day (QID) | INTRAVENOUS | Status: DC | PRN
  Administered 2016-04-02 – 2016-04-04 (×4): 500 mg via INTRAVENOUS
  Filled 2016-04-02 (×8): qty 5

## 2016-04-02 MED ORDER — LETROZOLE 2.5 MG PO TABS
2.5000 mg | ORAL_TABLET | Freq: Every day | ORAL | Status: DC
  Administered 2016-04-03 – 2016-04-11 (×9): 2.5 mg via ORAL
  Filled 2016-04-02 (×11): qty 1

## 2016-04-02 MED ORDER — ZOLPIDEM TARTRATE 5 MG PO TABS
5.0000 mg | ORAL_TABLET | Freq: Every evening | ORAL | Status: DC | PRN
  Administered 2016-04-02 – 2016-04-10 (×9): 5 mg via ORAL
  Filled 2016-04-02 (×9): qty 1

## 2016-04-02 MED ORDER — DEXAMETHASONE SODIUM PHOSPHATE 10 MG/ML IJ SOLN
10.0000 mg | Freq: Once | INTRAMUSCULAR | Status: AC
  Administered 2016-04-02: 10 mg via INTRAVENOUS
  Filled 2016-04-02: qty 1

## 2016-04-02 MED ORDER — CITALOPRAM HYDROBROMIDE 20 MG PO TABS
20.0000 mg | ORAL_TABLET | ORAL | Status: DC
  Administered 2016-04-03 – 2016-04-11 (×8): 20 mg via ORAL
  Filled 2016-04-02 (×9): qty 1

## 2016-04-02 MED ORDER — MOMETASONE FURO-FORMOTEROL FUM 100-5 MCG/ACT IN AERO
2.0000 | INHALATION_SPRAY | Freq: Two times a day (BID) | RESPIRATORY_TRACT | Status: DC
  Administered 2016-04-02 – 2016-04-11 (×19): 2 via RESPIRATORY_TRACT
  Filled 2016-04-02: qty 8.8

## 2016-04-02 MED ORDER — ONDANSETRON HCL 4 MG PO TABS: 4.0000 mg | ORAL_TABLET | Freq: Four times a day (QID) | ORAL | Status: DC | PRN

## 2016-04-02 MED ORDER — DOCUSATE SODIUM 100 MG PO CAPS
100.0000 mg | ORAL_CAPSULE | Freq: Two times a day (BID) | ORAL | Status: DC
  Administered 2016-04-02 – 2016-04-05 (×6): 100 mg via ORAL
  Filled 2016-04-02 (×6): qty 1

## 2016-04-02 MED ORDER — HYDROMORPHONE HCL 1 MG/ML IJ SOLN
0.5000 mg | INTRAMUSCULAR | Status: DC | PRN
  Administered 2016-04-02: 0.5 mg via INTRAVENOUS
  Filled 2016-04-02: qty 1

## 2016-04-02 MED ORDER — MONTELUKAST SODIUM 10 MG PO TABS
10.0000 mg | ORAL_TABLET | Freq: Every day | ORAL | Status: DC
  Administered 2016-04-02 – 2016-04-11 (×10): 10 mg via ORAL
  Filled 2016-04-02 (×10): qty 1

## 2016-04-02 MED ORDER — VANCOMYCIN HCL IN DEXTROSE 1-5 GM/200ML-% IV SOLN: 1000.0000 mg | Freq: Once | INTRAVENOUS | Status: DC

## 2016-04-02 MED ORDER — GADOBENATE DIMEGLUMINE 529 MG/ML IV SOLN
15.0000 mL | Freq: Once | INTRAVENOUS | Status: AC | PRN
  Administered 2016-04-02: 14 mL via INTRAVENOUS

## 2016-04-02 MED ORDER — PRAVASTATIN SODIUM 40 MG PO TABS
40.0000 mg | ORAL_TABLET | Freq: Every day | ORAL | Status: DC
  Administered 2016-04-02 – 2016-04-11 (×10): 40 mg via ORAL
  Filled 2016-04-02 (×10): qty 1

## 2016-04-02 MED ORDER — HYDROMORPHONE HCL 1 MG/ML IJ SOLN
0.5000 mg | Freq: Once | INTRAMUSCULAR | Status: AC
  Administered 2016-04-02: 0.5 mg via INTRAVENOUS
  Filled 2016-04-02: qty 1

## 2016-04-02 MED ORDER — MORPHINE SULFATE (PF) 2 MG/ML IV SOLN
2.0000 mg | INTRAVENOUS | Status: DC | PRN
  Administered 2016-04-02 – 2016-04-03 (×4): 2 mg via INTRAVENOUS
  Filled 2016-04-02 (×4): qty 1

## 2016-04-02 MED ORDER — HYDROCODONE-ACETAMINOPHEN 5-325 MG PO TABS
1.0000 | ORAL_TABLET | Freq: Four times a day (QID) | ORAL | Status: DC | PRN
  Administered 2016-04-02 – 2016-04-03 (×3): 1 via ORAL
  Filled 2016-04-02 (×3): qty 1

## 2016-04-02 MED ORDER — PIPERACILLIN-TAZOBACTAM 3.375 G IVPB 30 MIN: 3.3750 g | Freq: Once | INTRAVENOUS | Status: DC

## 2016-04-02 NOTE — ED Provider Notes (Signed)
[facility] DEPT Provider Note   CSN: [id number] Arrival date & time: 04/02/16  1214     History   Chief Complaint Chief Complaint  Patient presents with  . Back Pain  . Abdominal Pain    HPI Sara David is a 70 y.o. female.  70 year old female with history of breast cancer, gallbladder removal secondary to cancer, high blood pressure, disc disease presents with worsening lower back pain and tingling in her lower extremities. This is similar to previous but more severe. Worse with position. Patient has had metastases to the spine for which she had radiation and last year. Dr. Tenny Craw in this patient's oncologist. Nothing improves the pain. At times it does wake her from sleep. Patient has received injections in the past. This is worse in the past few weeks however she has had intermittent pain throughout the year.   The history is provided by the patient.  Back Pain   Associated symptoms include numbness and abdominal pain. Pertinent negatives include no chest pain, no fever, no headaches, no dysuria and no weakness.  Abdominal Pain   Pertinent negatives include fever, vomiting, dysuria and headaches.    Past Medical History:  Diagnosis Date  . Breast cancer ([place]) 2012    no lumpectomy. Treating with meds  . Cancer ([place])    small intestines 2012, gallbladder 2016  . Family history of adverse reaction to anesthesia    daughter gets nauseated  . Hypertension     Patient Active Problem List   Diagnosis Date Noted  . Asthma without status asthmaticus 10/17/2015  . Malignant neoplasm of upper-outer quadrant of right female breast ([place]) 10/17/2015  . Carpal tunnel syndrome 10/17/2015  . Cervical disc disease 10/17/2015  . Cervical pain 10/17/2015  . Benign essential HTN 10/17/2015  . Acid reflux 10/17/2015  . Combined fat and carbohydrate induced hyperlipemia 10/17/2015  . Adult hypothyroidism 10/17/2015  . Adaptive colitis 10/17/2015  . Primary osteoarthritis of  right hip 10/03/2015  . Small bowel obstruction ([place]) 01/30/2015  . Back pain 01/30/2015  . B12 deficiency 01/30/2015  . Gastroesophageal reflux disease 01/30/2015  . IBS (irritable bowel syndrome) 01/30/2015  . Asthma 01/30/2015  . Hypothyroidism 01/30/2015  . FH: cholecystectomy 01/30/2015  . Breast cancer metastasized to multiple sites ([place]) 01/30/2015  . Bloodgood disease 05/30/2014  . Post menopausal syndrome 05/30/2014  . Raynaud's syndrome without gangrene 05/30/2014  . Chronic bronchitis ([place]) 01/26/2014    Past Surgical History:  Procedure Laterality Date  . ABDOMINAL HYSTERECTOMY    . BACK SURGERY    . BREAST BIOPSY Right 2012   positive  . BREAST BIOPSY Right 2014   positive  . BREAST EXCISIONAL BIOPSY Right 1995   benign  . CHOLECYSTECTOMY    . TOTAL HIP ARTHROPLASTY Right 10/03/2015   Procedure: TOTAL HIP ARTHROPLASTY ANTERIOR APPROACH;  Surgeon: Hessie Knows, MD;  Location: [facility] ORS;  Service: Orthopedics;  Laterality: Right;    OB History    No data available       Home Medications    Prior to Admission medications   Medication Sig Start Date End Date Taking? Authorizing Provider  acetaminophen (TYLENOL) 500 MG tablet Take 500-1,000 mg by mouth every 6 (six) hours as needed for mild pain or headache.    Historical Provider, MD  cetirizine (ZYRTEC) 10 MG tablet Take 10 mg by mouth daily. Reported on 10/03/2015    Historical Provider, MD  citalopram (CELEXA) 20 MG tablet Take 20 mg by mouth every morning.  Historical Provider, MD  Docusate Calcium (STOOL SOFTENER PO) Take 1 capsule by mouth every morning.    Historical Provider, MD  enoxaparin (LOVENOX) 40 MG/0.4ML injection Inject 0.4 mLs (40 mg total) into the skin daily. 10/05/15   Duanne Guess, PA-C  Fluticasone-Salmeterol (ADVAIR) 100-50 MCG/DOSE AEPB Inhale 1 puff into the lungs every morning.     Historical Provider, MD  HYDROcodone-acetaminophen (NORCO/VICODIN) 5-325 MG tablet Take 1 tablet by  mouth every 12 (twelve) hours as needed. 11/08/15   Historical Provider, MD  HYDROcodone-acetaminophen (NORCO/VICODIN) 5-325 MG tablet Take 1 tablet by mouth every 6 (six) hours as needed for moderate pain. 11/22/15   Evlyn Kanner, NP  letrozole (FEMARA) 2.5 MG tablet TAKE 1 TABLET ONE TIME DAILY Patient taking differently: TAKE 1 TABLET ONE TIME DAILY in am 05/13/15   Lloyd Huger, MD  levothyroxine (SYNTHROID, LEVOTHROID) 100 MCG tablet Take 100 mcg by mouth daily before breakfast.     Historical Provider, MD  montelukast (SINGULAIR) 10 MG tablet Take 10 mg by mouth at bedtime.     Historical Provider, MD  Multiple Vitamins-Minerals (CENTRUM SILVER PO) Take 1 tablet by mouth every morning.    Historical Provider, MD  omeprazole (PRILOSEC) 20 MG capsule Take 20 mg by mouth every morning.     Historical Provider, MD  oxyCODONE (OXY IR/ROXICODONE) 5 MG immediate release tablet Take 1-2 tablets (5-10 mg total) by mouth every 3 (three) hours as needed for breakthrough pain. 10/05/15   Duanne Guess, PA-C  pravastatin (PRAVACHOL) 40 MG tablet Take 40 mg by mouth at bedtime.     Historical Provider, MD  promethazine (PHENERGAN) 25 MG tablet TAKE 1 TABLET (25 MG TOTAL) BY MOUTH EVERY 6 (SIX) HOURS AS NEEDED FOR NAUSEA OR VOMITING. 08/04/15   Historical Provider, MD  sucralfate (CARAFATE) 1 g tablet TAKE 1 TABLET (1 G TOTAL) BY MOUTH 3 (THREE) TIMES DAILY. 09/06/15   Historical Provider, MD  valsartan-hydrochlorothiazide (DIOVAN-HCT) 80-12.5 MG per tablet Take 1 tablet by mouth every morning.     Historical Provider, MD  zolpidem (AMBIEN) 10 MG tablet Take 1 tablet (10 mg total) by mouth at bedtime as needed. for sleep 03/20/16   Lloyd Huger, MD    Family History Family History  Problem Relation Age of Onset  . Lung cancer Brother 8  . Heart disease Father   . Diabetes Father     Social History Social History  Substance Use Topics  . Smoking status: Current Every Day Smoker     Packs/day: 0.50    Types: Cigarettes  . Smokeless tobacco: Never Used  . Alcohol use No     Allergies   Diclofenac sodium; Erythromycin; Tape; and Wellbutrin [bupropion]   Review of Systems Review of Systems  Constitutional: Negative for chills and fever.  HENT: Negative for congestion.   Eyes: Negative for visual disturbance.  Respiratory: Negative for shortness of breath.   Cardiovascular: Negative for chest pain.  Gastrointestinal: Positive for abdominal pain. Negative for vomiting.  Genitourinary: Negative for dysuria and flank pain.  Musculoskeletal: Positive for back pain. Negative for neck pain and neck stiffness.  Skin: Negative for rash.  Neurological: Positive for numbness. Negative for weakness, light-headedness and headaches.     Physical Exam Updated Vital Signs BP 120/56   Pulse 75   Temp 98.2 F (36.8 C) (Oral)   Resp 22   Wt 148 lb (67.1 kg)   SpO2 100%   BMI 25.40 kg/m  Physical Exam  Constitutional: She is oriented to person, place, and time. She appears well-developed and well-nourished.  HENT:  Head: Normocephalic and atraumatic.  Eyes: Conjunctivae are normal. Right eye exhibits no discharge. Left eye exhibits no discharge.  Neck: Normal range of motion. Neck supple. No tracheal deviation present.  Cardiovascular: Normal rate and regular rhythm.   Pulmonary/Chest: Effort normal and breath sounds normal.  Abdominal: Soft. She exhibits no distension. There is no guarding.  Musculoskeletal: She exhibits tenderness. She exhibits no edema.  Patient has moderate pain midline paraspinal lower lumbar sacral region  Neurological: She is alert and oriented to person, place, and time.  Reflex Scores:      Patellar reflexes are 2+ on the right side and 2+ on the left side.      Achilles reflexes are 1+ on the right side and 1+ on the left side. 5+ strength in LE with f/e at major joints. Sensation to palpation intact in LE. CNs 2-12 grossly intact.      Skin: Skin is warm. No rash noted.  Psychiatric: She has a normal mood and affect.  Nursing note and vitals reviewed.    ED Treatments / Results  Labs (all labs ordered are listed, but only abnormal results are displayed) Labs Reviewed  CBC - Abnormal; Notable for the following:       Result Value   RBC 3.60 (*)    Hemoglobin 10.8 (*)    HCT 34.8 (*)    All other components within normal limits  BASIC METABOLIC PANEL    EKG  EKG Interpretation None       Radiology No results found.  Procedures Procedures (including critical care time)  Medications Ordered in ED Medications  HYDROmorphone (DILAUDID) injection 0.5 mg (0.5 mg Intravenous Given 04/02/16 1353)     Initial Impression / Assessment and Plan / ED Course  I have reviewed the triage vital signs and the nursing notes.  Pertinent labs & imaging results that were available during my care of the patient were reviewed by me and considered in my medical decision making (see chart for details).  Clinical Course   Patient presents with worsening back pain and history of cancer. With tingling sensation bilateral lower extremities and worsening severe pain plan for MRI with and without contrast for further delineation of her pain. Pain meds and basic blood work in the ER.  Patient's care be signed out to afternoon provider to reassess and follow-up MRI results.   Final Clinical Impressions(s) / ED Diagnoses   Final diagnoses:  Back pain    New Prescriptions New Prescriptions   No medications on file     Elnora Morrison, MD 04/03/16 1218

## 2016-04-02 NOTE — ED Notes (Signed)
Patient transported to MRI 

## 2016-04-02 NOTE — H&P (Signed)
[facility] History and Physical  Sara David [id number] DOB: [birth year] DOA: 04/02/2016  PCP: Marinda Elk, MD  Patient coming from: Home  Chief Complaint: Back pain  HPI: Sara David is a 70 y.o. female with a medical history of metastatic breast cancer, hypertension, presented to the emergency department with complaints of back pain that has been worsening since Saturday. Patient states she's had back pain intermittently for the last 3 years however Saturday she began to have very sharp pain located in her lower back radiating to her left buttock. Patient was taking her pain medication without resolution of her symptoms or any improvement in her back pain. Patient did try to see her pain management doctor however could not obtain an appointment until next Friday. Currently she denies any chest pain, shortness of breath, abdominal pain, nausea or vomiting, diarrhea, dizziness or headache. She denies recent travel, or contact with ill persons. She does admit to trying to help her mother out of the shower on Saturday. Patient does endorse feeling warm and hired as well as having constipation.  ED Course:  MRI lumbar spine was obtained showing an abnormality of L3-L4, concerning for tumor progression versus discitis versus osteomyelitis. Patient was found to have no leukocytosis or fever. Blood cultures were obtained and currently pending. [facility] called for admission.  Review of Systems:  All other systems reviewed and are negative.   Past Medical History:  Diagnosis Date  . Breast cancer ([place]) 2012    no lumpectomy. Treating with meds  . Cancer ([place])    small intestines 2012, gallbladder 2016  . Family history of adverse reaction to anesthesia    daughter gets nauseated  . Hypertension     Past Surgical History:  Procedure Laterality Date  . ABDOMINAL HYSTERECTOMY    . BACK SURGERY    . BREAST BIOPSY Right 2012   positive  . BREAST BIOPSY Right 2014   positive  .  BREAST EXCISIONAL BIOPSY Right 1995   benign  . CHOLECYSTECTOMY    . TOTAL HIP ARTHROPLASTY Right 10/03/2015   Procedure: TOTAL HIP ARTHROPLASTY ANTERIOR APPROACH;  Surgeon: Hessie Knows, MD;  Location: [facility] ORS;  Service: Orthopedics;  Laterality: Right;    Social History:  reports that she has been smoking Cigarettes.  She has been smoking about 0.50 packs per day. She has never used smokeless tobacco. She reports that she does not drink alcohol or use drugs.   Allergies  Allergen Reactions  . Diclofenac Sodium Nausea Only  . Erythromycin Diarrhea and Nausea And Vomiting  . Tape Itching and Rash  . Wellbutrin [Bupropion] Rash    Family History  Problem Relation Age of Onset  . Lung cancer Brother 78  . Heart disease Father   . Diabetes Father      Prior to Admission medications   Medication Sig Start Date End Date Taking? Authorizing Provider  acetaminophen (TYLENOL) 500 MG tablet Take 500-1,000 mg by mouth every 6 (six) hours as needed for mild pain or headache.    Historical Provider, MD  cetirizine (ZYRTEC) 10 MG tablet Take 10 mg by mouth daily. Reported on 10/03/2015    Historical Provider, MD  citalopram (CELEXA) 20 MG tablet Take 20 mg by mouth every morning.     Historical Provider, MD  Docusate Calcium (STOOL SOFTENER PO) Take 1 capsule by mouth every morning.    Historical Provider, MD  Fluticasone-Salmeterol (ADVAIR) 100-50 MCG/DOSE AEPB Inhale 1 puff into the lungs every morning.  Historical Provider, MD  HYDROcodone-acetaminophen (NORCO/VICODIN) 5-325 MG tablet Take 1 tablet by mouth every 12 (twelve) hours as needed. 11/08/15   Historical Provider, MD  HYDROcodone-acetaminophen (NORCO/VICODIN) 5-325 MG tablet Take 1 tablet by mouth every 6 (six) hours as needed for moderate pain. 11/22/15   Evlyn Kanner, NP  letrozole (FEMARA) 2.5 MG tablet TAKE 1 TABLET ONE TIME DAILY Patient taking differently: TAKE 1 TABLET ONE TIME DAILY in am 05/13/15   Lloyd Huger, MD    levothyroxine (SYNTHROID, LEVOTHROID) 100 MCG tablet Take 100 mcg by mouth daily before breakfast.     Historical Provider, MD  montelukast (SINGULAIR) 10 MG tablet Take 10 mg by mouth at bedtime.     Historical Provider, MD  Multiple Vitamins-Minerals (CENTRUM SILVER PO) Take 1 tablet by mouth every morning.    Historical Provider, MD  omeprazole (PRILOSEC) 20 MG capsule Take 20 mg by mouth every morning.     Historical Provider, MD  oxyCODONE (OXY IR/ROXICODONE) 5 MG immediate release tablet Take 1-2 tablets (5-10 mg total) by mouth every 3 (three) hours as needed for breakthrough pain. 10/05/15   Duanne Guess, PA-C  pravastatin (PRAVACHOL) 40 MG tablet Take 40 mg by mouth at bedtime.     Historical Provider, MD  promethazine (PHENERGAN) 25 MG tablet TAKE 1 TABLET (25 MG TOTAL) BY MOUTH EVERY 6 (SIX) HOURS AS NEEDED FOR NAUSEA OR VOMITING. 08/04/15   Historical Provider, MD  sucralfate (CARAFATE) 1 g tablet TAKE 1 TABLET (1 G TOTAL) BY MOUTH 3 (THREE) TIMES DAILY. 09/06/15   Historical Provider, MD  valsartan-hydrochlorothiazide (DIOVAN-HCT) 80-12.5 MG per tablet Take 1 tablet by mouth every morning.     Historical Provider, MD  zolpidem (AMBIEN) 10 MG tablet Take 1 tablet (10 mg total) by mouth at bedtime as needed. for sleep 03/20/16   Lloyd Huger, MD    Physical Exam: Vitals:   04/02/16 1700 04/02/16 1715  BP: 130/60 128/72  Pulse: 78 74  Resp:    Temp:       General: Well developed, well nourished, NAD, appears stated age  HEENT: NCAT, PERRLA, EOMI, Anicteic Sclera, mucous membranes dry  Neck: Supple, no JVD, no masses  Cardiovascular: S1 S2 auscultated, no rubs, murmurs or gallops. Regular rate and rhythm.  Respiratory: Clear to auscultation bilaterally with equal chest rise  Abdomen: Soft, nontender, nondistended, + bowel sounds  Extremities: warm dry without cyanosis clubbing or edema  Neuro: AAOx3, cranial nerves grossly intact. Strength 5/5 in patient's upper  and lower extremities bilaterally  Skin: Without rashes exudates or nodules  Psych: Normal affect and demeanor with intact judgement and insight  Labs on Admission: I have personally reviewed following labs and imaging studies CBC:  Recent Labs Lab 04/02/16 1350  WBC 5.0  HGB 10.8*  HCT 34.8*  MCV 96.7  PLT 0000000   Basic Metabolic Panel:  Recent Labs Lab 04/02/16 1350  NA 137  K 3.2*  CL 103  CO2 26  GLUCOSE 130*  BUN 14  CREATININE 1.09*  CALCIUM 9.2   GFR: CrCl cannot be calculated (Unknown ideal weight.). Liver Function Tests: No results for input(s): AST, ALT, ALKPHOS, BILITOT, PROT, ALBUMIN in the last 168 hours. No results for input(s): LIPASE, AMYLASE in the last 168 hours. No results for input(s): AMMONIA in the last 168 hours. Coagulation Profile: No results for input(s): INR, PROTIME in the last 168 hours. Cardiac Enzymes: No results for input(s): CKTOTAL, CKMB, CKMBINDEX, TROPONINI in the last  168 hours. BNP (last 3 results) No results for input(s): PROBNP in the last 8760 hours. HbA1C: No results for input(s): HGBA1C in the last 72 hours. CBG: No results for input(s): GLUCAP in the last 168 hours. Lipid Profile: No results for input(s): CHOL, HDL, LDLCALC, TRIG, CHOLHDL, LDLDIRECT in the last 72 hours. Thyroid Function Tests: No results for input(s): TSH, T4TOTAL, FREET4, T3FREE, THYROIDAB in the last 72 hours. Anemia Panel: No results for input(s): VITAMINB12, FOLATE, FERRITIN, TIBC, IRON, RETICCTPCT in the last 72 hours. Urine analysis:    Component Value Date/Time   COLORURINE YELLOW (A) 10/13/2015 1250   APPEARANCEUR TURBID (A) 10/13/2015 1250   LABSPEC 1.023 10/13/2015 1250   PHURINE 5.0 10/13/2015 1250   GLUCOSEU NEGATIVE 10/13/2015 1250   HGBUR NEGATIVE 10/13/2015 1250   BILIRUBINUR NEGATIVE 10/13/2015 1250   KETONESUR NEGATIVE 10/13/2015 1250   PROTEINUR NEGATIVE 10/13/2015 1250   NITRITE NEGATIVE 10/13/2015 1250   LEUKOCYTESUR  NEGATIVE 10/13/2015 1250   Sepsis Labs: @LABRCNTIP (procalcitonin:4,lacticidven:4) )No results found for this or any previous visit (from the past 240 hour(s)).   Radiological Exams on Admission: Mr Lumbar Spine W Wo Contrast  Result Date: 04/02/2016 CLINICAL DATA:  Low back pain.  Various forms of cancer in the past. EXAM: MRI LUMBAR SPINE WITHOUT AND WITH CONTRAST TECHNIQUE: Multiplanar and multiecho pulse sequences of the lumbar spine were obtained without and with intravenous contrast. CONTRAST:  64mL MULTIHANCE GADOBENATE DIMEGLUMINE 529 MG/ML IV SOLN COMPARISON:  09/01/2015 FINDINGS: Segmentation: The lowest lumbar type non-rib-bearing vertebra is labeled as L5. Alignment:  3 mm degenerative retrolisthesis at L4-5. Vertebrae: Type 3 degenerative endplate findings at X33443 and L4-5, with enhancement along the left eccentric L3-4 degenerative endplate findings. Schmorl's node along the inferior endplate of L3, increased from prior. Previously anterior enhancing lesion at L1 no longer appreciated. Previous enhancing lesion along the superior endplate of [id number] no longer seen. Conus medullaris: Extends to the L1 level and appears normal. Paraspinal and other soft tissues: Unremarkable Disc levels: L1-2: No impingement, right facet arthropathy and focal ligamentum flavum redundancy. L2-3: Moderate central narrowing of the thecal sac with mild left foraminal stenosis and mild bilateral subarticular lateral recess stenosis due to disc bulge, left paracentral disc protrusion extending caudad, and facet arthropathy. Similar appearance to prior. L3-4: The patient has had a remote prior left laminectomy at this level. However, there is new abnormal enhancing epidural tissue at this level extending nearly circumferentially but especially anteriorly and eccentric to the left, tracking from the top of the L3 vertebral body nearly to the bottom of the L4 vertebral body in the left lateral recess. This tissue appears to  be diffusely enhancing and extends into the left L3-4 neural foramen. Prominent left foraminal stenosis and moderate central narrowing of the thecal sac are present. The left L4 nerve roots course through some of this enhancing tissue in the lateral recess. L4-5: There is accentuated enhancement in the left neural foramen due to the epidural process extending into the left neural foramen. Moderate right and mild left foraminal stenosis. Moderate central narrowing of the thecal sac and mild bilateral subarticular lateral recess stenosis. L5-S1: Mild bilateral foraminal stenosis primarily from facet spurring, stable. IMPRESSION: 1. New abnormal epidural tissue enhancement centered at L3-4 and to the left, and extending both cephalad and caudad, possibly from tumor or epidural phlegmon/infection. The patient does have a left laminectomy at this level, but had this before without the epidural enhancing tissue, and accordingly epidural fibrosis as an explanation for  this enhancing tissue seems unlikely. There is some worsening of the endplate findings at X33443, especially eccentric to the left, with considerable endplate enhancement, although without high T2 signal and enhancement in the intervertebral disc to further favor discitis -osteomyelitis. Enhancement associated with this process extends into the left neural foramina at L3-4 and L4-5. Correlate with any fever/leukocytosis. In conjunction with the spondylosis and degenerative disc disease at this level, there is prominent left foraminal stenosis and moderate central narrowing of the thecal sac at L3-4. 2. Lumbar spondylosis and degenerative disc disease also causes moderate impingement at L2-3 and L4-5, and mild impingement at L5-S1. These results will be called to the ordering clinician or representative by the Radiologist Assistant, and communication documented in the PACS or zVision Dashboard. Electronically Signed   By: Van Clines M.D.   On: 04/02/2016  16:12    EKG: None  Assessment/Plan  Acute on chronic lower back pain -MRI lumbar spine: new abnormal epidural tissue L3-4 and to the left, ?discitis vs osteomyelitis -Patient currently has no leukocytosis and is afebrile -Will hold off on staring antibiotics -Blood cultures have been ordered and drawn, currently pending -May want to consult IR in the morning for possible biopsy -Will place patient on IV as well as oral pain control, IV robaxin -Patient follows with the [facility] Pain clinic, Dr. Sharlet Salina  Metastatic breast cancer -Sees Dr. Grayland [facility]) -Continue letrozole  Essential Hypertension -Recently taken off of her blood pressure medications one week ago due to hypotension -Blood pressure currently stable, will start IV pain medications as needed  Chronic normocytic anemia -Baseline hemoglobin between 10 and 11, currently 10.8 -Continue to monitor CBC  Tobacco abuse -Discussed smoking cessation -Nicotine patch  Hypokalemia -Replace and continue to monitor BMP  Mild acute kidney injury -Creatinine currently 1.09, baseline appears to be 0.7-0.8 -Will place on gentle IV fluids and monitor BMP  Constipation -Continue MiraLAX  Hypothyroidism -Continue Synthroid  GERD -Continue PPI  DVT prophylaxis: SCDs  Code Status: DNR  Family Communication: Sister at bedside.  Admission, patients condition and plan of care including tests being ordered have been discussed with the patient and sister who indicate understanding and agree with the plan and Code Status.  Disposition Plan: Admitted for observation   Consults called: None  Admission status: Observation   Time spent: 60 minutes  Edith Groleau D.O. [facility] Pager [phone]  If 7PM-7AM, please contact night-coverage [email or web address] Password [facility] 04/02/2016, 5:33 PM

## 2016-04-02 NOTE — Progress Notes (Addendum)
Patient arrived to floor from ED. Alert and Oriented. Report received from [place], [place].

## 2016-04-02 NOTE — ED Provider Notes (Signed)
Please see previous physicians note regarding patient's presenting history and physical, initial ED course, and associated medical decision making.  In short, this is 70 year old female w/ history of metastatic breast cancer to small ball, gallbladder and T7 vertebral spine who presents with worsening low back pain with tingling in lower extremities. Pending MRI for progression of metastatic disease and spinal cord involvement. MRi visualized and reviewed with radiology. Abnormality of the L4-L5 spine concerning for tumor progression versus discitis/osteomyelitis. No fever, no leukocytosis. We'll send blood cultures and inflammatory markers. Has been complaining of some night sweats and some chills recently, but this may also be related to her underlying malignancy. Having significant pain as well after her MRI. Discussed with Dr. Ree Kida from hospitalist service. At this time we'll hold antibiotics. She will likely require IR consult for biopsy versus culture and ID consult. Admitted for ongoing w/u and pain management.   Forde Dandy, MD 04/02/16 [id number]

## 2016-04-02 NOTE — ED Notes (Signed)
Urine sample at bedside

## 2016-04-02 NOTE — ED Triage Notes (Signed)
Lower back pain  And she has been taking pain meds and now she cannot poop, she also states she wet herself cause she was in so much pain, also having left lower abd pain  Started today, could now get into see her pain dr so she is here, has hip replacement hx and back issues

## 2016-04-02 NOTE — ED Notes (Signed)
Pt states she is a pt of a pain clinic, but was unable to get an appt today.

## 2016-04-03 ENCOUNTER — Encounter (HOSPITAL_COMMUNITY): Payer: Self-pay | Admitting: Radiology

## 2016-04-03 DIAGNOSIS — C784 Secondary malignant neoplasm of small intestine: Secondary | ICD-10-CM | POA: Diagnosis not present

## 2016-04-03 DIAGNOSIS — Z85828 Personal history of other malignant neoplasm of skin: Secondary | ICD-10-CM | POA: Diagnosis not present

## 2016-04-03 DIAGNOSIS — N179 Acute kidney failure, unspecified: Secondary | ICD-10-CM | POA: Diagnosis not present

## 2016-04-03 DIAGNOSIS — I10 Essential (primary) hypertension: Secondary | ICD-10-CM | POA: Diagnosis present

## 2016-04-03 DIAGNOSIS — Z96641 Presence of right artificial hip joint: Secondary | ICD-10-CM | POA: Diagnosis not present

## 2016-04-03 DIAGNOSIS — Z79899 Other long term (current) drug therapy: Secondary | ICD-10-CM | POA: Diagnosis not present

## 2016-04-03 DIAGNOSIS — E039 Hypothyroidism, unspecified: Secondary | ICD-10-CM | POA: Diagnosis present

## 2016-04-03 DIAGNOSIS — Z23 Encounter for immunization: Secondary | ICD-10-CM | POA: Diagnosis not present

## 2016-04-03 DIAGNOSIS — D6489 Other specified anemias: Secondary | ICD-10-CM | POA: Diagnosis not present

## 2016-04-03 DIAGNOSIS — C50911 Malignant neoplasm of unspecified site of right female breast: Secondary | ICD-10-CM | POA: Diagnosis not present

## 2016-04-03 DIAGNOSIS — Z7901 Long term (current) use of anticoagulants: Secondary | ICD-10-CM | POA: Diagnosis not present

## 2016-04-03 DIAGNOSIS — C7951 Secondary malignant neoplasm of bone: Secondary | ICD-10-CM | POA: Diagnosis not present

## 2016-04-03 DIAGNOSIS — Z7951 Long term (current) use of inhaled steroids: Secondary | ICD-10-CM | POA: Diagnosis not present

## 2016-04-03 DIAGNOSIS — C7889 Secondary malignant neoplasm of other digestive organs: Secondary | ICD-10-CM | POA: Diagnosis not present

## 2016-04-03 DIAGNOSIS — C50411 Malignant neoplasm of upper-outer quadrant of right female breast: Secondary | ICD-10-CM | POA: Diagnosis not present

## 2016-04-03 DIAGNOSIS — E7800 Pure hypercholesterolemia, unspecified: Secondary | ICD-10-CM | POA: Diagnosis not present

## 2016-04-03 DIAGNOSIS — Z66 Do not resuscitate: Secondary | ICD-10-CM | POA: Diagnosis not present

## 2016-04-03 DIAGNOSIS — M4626 Osteomyelitis of vertebra, lumbar region: Secondary | ICD-10-CM | POA: Diagnosis present

## 2016-04-03 DIAGNOSIS — M4646 Discitis, unspecified, lumbar region: Secondary | ICD-10-CM | POA: Diagnosis present

## 2016-04-03 DIAGNOSIS — Z79811 Long term (current) use of aromatase inhibitors: Secondary | ICD-10-CM | POA: Diagnosis not present

## 2016-04-03 DIAGNOSIS — E038 Other specified hypothyroidism: Secondary | ICD-10-CM | POA: Diagnosis not present

## 2016-04-03 DIAGNOSIS — C50919 Malignant neoplasm of unspecified site of unspecified female breast: Secondary | ICD-10-CM | POA: Diagnosis not present

## 2016-04-03 DIAGNOSIS — Z8589 Personal history of malignant neoplasm of other organs and systems: Secondary | ICD-10-CM | POA: Diagnosis not present

## 2016-04-03 DIAGNOSIS — R509 Fever, unspecified: Secondary | ICD-10-CM | POA: Diagnosis not present

## 2016-04-03 DIAGNOSIS — K5900 Constipation, unspecified: Secondary | ICD-10-CM | POA: Diagnosis not present

## 2016-04-03 DIAGNOSIS — Z853 Personal history of malignant neoplasm of breast: Secondary | ICD-10-CM | POA: Diagnosis not present

## 2016-04-03 DIAGNOSIS — Z90710 Acquired absence of both cervix and uterus: Secondary | ICD-10-CM | POA: Diagnosis not present

## 2016-04-03 DIAGNOSIS — E876 Hypokalemia: Secondary | ICD-10-CM | POA: Diagnosis not present

## 2016-04-03 DIAGNOSIS — Z9889 Other specified postprocedural states: Secondary | ICD-10-CM | POA: Diagnosis not present

## 2016-04-03 DIAGNOSIS — F17210 Nicotine dependence, cigarettes, uncomplicated: Secondary | ICD-10-CM | POA: Diagnosis not present

## 2016-04-03 DIAGNOSIS — K219 Gastro-esophageal reflux disease without esophagitis: Secondary | ICD-10-CM | POA: Diagnosis present

## 2016-04-03 DIAGNOSIS — J449 Chronic obstructive pulmonary disease, unspecified: Secondary | ICD-10-CM | POA: Diagnosis not present

## 2016-04-03 DIAGNOSIS — M545 Low back pain: Secondary | ICD-10-CM | POA: Diagnosis not present

## 2016-04-03 LAB — COMPREHENSIVE METABOLIC PANEL
ALBUMIN: 3 g/dL — AB (ref 3.5–5.0)
ALK PHOS: 80 U/L (ref 38–126)
ALT: 54 U/L (ref 14–54)
AST: 52 U/L — AB (ref 15–41)
Anion gap: 8 (ref 5–15)
BILIRUBIN TOTAL: 0.3 mg/dL (ref 0.3–1.2)
BUN: 12 mg/dL (ref 6–20)
CALCIUM: 8.7 mg/dL — AB (ref 8.9–10.3)
CO2: 25 mmol/L (ref 22–32)
CREATININE: 0.9 mg/dL (ref 0.44–1.00)
Chloride: 105 mmol/L (ref 101–111)
GFR calc Af Amer: >60 mL/min (ref >60)
GLUCOSE: 140 mg/dL — AB (ref 65–99)
Potassium: 3.5 mmol/L (ref 3.5–5.1)
Sodium: 138 mmol/L (ref 135–145)
TOTAL PROTEIN: 6 g/dL — AB (ref 6.5–8.1)

## 2016-04-03 LAB — CBC
HCT: 30.4 % — ABNORMAL LOW (ref 36.0–46.0)
Hemoglobin: 9.8 g/dL — ABNORMAL LOW (ref 12.0–15.0)
MCH: 30.5 pg (ref 26.0–34.0)
MCHC: 32.2 g/dL (ref 30.0–36.0)
MCV: 94.7 fL (ref 78.0–100.0)
PLATELETS: 168 10*3/uL (ref 150–400)
RBC: 3.21 MIL/uL — ABNORMAL LOW (ref 3.87–5.11)
RDW: 13.4 % (ref 11.5–15.5)
WBC: 5.2 10*3/uL (ref 4.0–10.5)

## 2016-04-03 MED ORDER — OXYCODONE-ACETAMINOPHEN 5-325 MG PO TABS
1.0000 | ORAL_TABLET | ORAL | Status: DC | PRN
  Administered 2016-04-03 – 2016-04-07 (×16): 2 via ORAL
  Administered 2016-04-07: 1 via ORAL
  Administered 2016-04-07 – 2016-04-11 (×16): 2 via ORAL
  Filled 2016-04-03 (×33): qty 2

## 2016-04-03 MED ORDER — HYDROMORPHONE HCL 1 MG/ML IJ SOLN
1.0000 mg | INTRAMUSCULAR | Status: AC
  Administered 2016-04-03: 1 mg via INTRAVENOUS
  Filled 2016-04-03: qty 1

## 2016-04-03 MED ORDER — HYDROMORPHONE HCL 1 MG/ML IJ SOLN
1.0000 mg | INTRAMUSCULAR | Status: DC | PRN
Start: 2016-04-03 — End: 2016-04-12
  Administered 2016-04-03 – 2016-04-09 (×16): 1 mg via INTRAVENOUS
  Filled 2016-04-03 (×16): qty 1

## 2016-04-03 MED ORDER — PNEUMOCOCCAL VAC POLYVALENT 25 MCG/0.5ML IJ INJ
0.5000 mL | INJECTION | INTRAMUSCULAR | Status: AC
  Administered 2016-04-04: 0.5 mL via INTRAMUSCULAR
  Filled 2016-04-03: qty 0.5

## 2016-04-03 MED ORDER — POTASSIUM CHLORIDE CRYS ER 20 MEQ PO TBCR
40.0000 meq | EXTENDED_RELEASE_TABLET | Freq: Once | ORAL | Status: AC
  Administered 2016-04-03: 40 meq via ORAL
  Filled 2016-04-03: qty 2

## 2016-04-03 MED ORDER — DIPHENHYDRAMINE HCL 25 MG PO CAPS
25.0000 mg | ORAL_CAPSULE | Freq: Once | ORAL | Status: AC
  Administered 2016-04-03: 25 mg via ORAL
  Filled 2016-04-03: qty 1

## 2016-04-03 NOTE — Progress Notes (Signed)
PROGRESS NOTE  Sara David  [id number] DOB: 06/20/1946 DOA: 04/02/2016 PCP: Marinda Elk, MD Outpatient Specialists:  Subjective: Was complaining about severe pain this morning, pain medications adjusted. IR consulted.  Brief Narrative:  Sara David is a 70 y.o. female with a medical history of metastatic breast cancer, hypertension, presented to the emergency department with complaints of back pain that has been worsening since Saturday. Patient states she's had back pain intermittently for the last 3 years however Saturday she began to have very sharp pain located in her lower back radiating to her left buttock. Patient was taking her pain medication without resolution of her symptoms or any improvement in her back pain. Patient did try to see her pain management doctor however could not obtain an appointment until next Friday. Currently she denies any chest pain, shortness of breath, abdominal pain, nausea or vomiting, diarrhea, dizziness or headache. She denies recent travel, or contact with ill persons. She does admit to trying to help her mother out of the shower on Saturday. Patient does endorse feeling warm and hired as well as having constipation.   Assessment & Plan:   Active Problems:   Back pain   Gastroesophageal reflux disease   Hypothyroidism   Breast cancer metastasized to multiple sites [facility])   Malignant neoplasm of upper-outer quadrant of right female breast (HCC)   Low back pain   Acute on chronic lower back pain, possible discitis -MRI lumbar spine: new abnormal epidural tissue L3-4 and to the left, ?discitis vs osteomyelitis -Patient has no leukocytosis, high CRP and sedimentation rate, had fever of 102.1 last night in the ED. -Continue to hold antibiotics. -Blood cultures obtained. -Interventional radiology consulted for consideration disc space aspiration. I also asked ID to see her in the morning. -Will place patient on IV as well as oral pain control,  IV robaxin  Metastatic breast cancer -Sees Dr. [facility]) -Continue letrozole  Essential Hypertension -Recently taken off of her blood pressure medications one week ago due to hypotension -Blood pressure currently stable, will start IV pain medications as needed  Chronic normocytic anemia -Baseline hemoglobin between 10 and 11, currently 10.8 -Continue to monitor CBC  Tobacco abuse -Discussed smoking cessation -Nicotine patch  Hypokalemia -Replace and continue to monitor BMP  Mild acute kidney injury -Creatinine currently 1.09, baseline appears to be 0.7-0.8 -This is back to normal at 0.9 after IV fluid hydration.  Constipation -Continue MiraLAX  Hypothyroidism -Continue Synthroid  GERD -Continue PPI   DVT prophylaxis:  Code Status: DNR Family Communication:  Disposition Plan:  Diet: Diet Heart Room service appropriate? Yes; Fluid consistency: Thin Diet NPO time specified  Consultants:   IR.  ID  Procedures:   None  Antimicrobials:   None  Objective: Vitals:   04/02/16 1915 04/02/16 2130 04/03/16 0500 04/03/16 1430  BP:  (!) 117/59 (!) 113/52 (!) 101/41  Pulse:  63 (!) 59 67  Resp:  18 17 16   Temp:  98.3 F (36.8 C) 98.2 F (36.8 C) 97.7 F (36.5 C)  TempSrc:  Oral  Oral  SpO2: 98% 98% 97% 98%  Weight:        Intake/Output Summary (Last 24 hours) at 04/03/16 1505 Last data filed at 04/03/16 1430  Gross per 24 hour  Intake             1425 ml  Output             1100 ml  Net  325 ml   Filed Weights   04/02/16 1237  Weight: 67.1 kg (148 lb)    Examination: General exam: Appears calm and comfortable  Respiratory system: Clear to auscultation. Respiratory effort normal. Cardiovascular system: S1 & S2 heard, RRR. No JVD, murmurs, rubs, gallops or clicks. No pedal edema. Gastrointestinal system: Abdomen is nondistended, soft and nontender. No organomegaly or masses felt. Normal bowel sounds  heard. Central nervous system: Alert and oriented. No focal neurological deficits. Extremities: Symmetric 5 x 5 power. Skin: No rashes, lesions or ulcers Psychiatry: Judgement and insight appear normal. Mood & affect appropriate.   Data Reviewed: I have personally reviewed following labs and imaging studies  CBC:  Recent Labs Lab 04/02/16 1350 04/03/16 0519  WBC 5.0 5.2  HGB 10.8* 9.8*  HCT 34.8* 30.4*  MCV 96.7 94.7  PLT 176 [id number]   Basic Metabolic Panel:  Recent Labs Lab 04/02/16 1350 04/03/16 0519  NA 137 138  K 3.2* 3.5  CL 103 105  CO2 26 25  GLUCOSE 130* 140*  BUN 14 12  CREATININE 1.09* 0.90  CALCIUM 9.2 8.7*   GFR: CrCl cannot be calculated (Unknown ideal weight.). Liver Function Tests:  Recent Labs Lab 04/03/16 0519  AST 52*  ALT 54  ALKPHOS 80  BILITOT 0.3  PROT 6.0*  ALBUMIN 3.0*   No results for input(s): LIPASE, AMYLASE in the last 168 hours. No results for input(s): AMMONIA in the last 168 hours. Coagulation Profile: No results for input(s): INR, PROTIME in the last 168 hours. Cardiac Enzymes: No results for input(s): CKTOTAL, CKMB, CKMBINDEX, TROPONINI in the last 168 hours. BNP (last 3 results) No results for input(s): PROBNP in the last 8760 hours. HbA1C: No results for input(s): HGBA1C in the last 72 hours. CBG: No results for input(s): GLUCAP in the last 168 hours. Lipid Profile: No results for input(s): CHOL, HDL, LDLCALC, TRIG, CHOLHDL, LDLDIRECT in the last 72 hours. Thyroid Function Tests: No results for input(s): TSH, T4TOTAL, FREET4, T3FREE, THYROIDAB in the last 72 hours. Anemia Panel: No results for input(s): VITAMINB12, FOLATE, FERRITIN, TIBC, IRON, RETICCTPCT in the last 72 hours. Urine analysis:    Component Value Date/Time   COLORURINE YELLOW (A) 10/13/2015 1250   APPEARANCEUR TURBID (A) 10/13/2015 1250   LABSPEC 1.023 10/13/2015 1250   PHURINE 5.0 10/13/2015 1250   GLUCOSEU NEGATIVE 10/13/2015 1250   HGBUR  NEGATIVE 10/13/2015 1250   BILIRUBINUR NEGATIVE 10/13/2015 1250   KETONESUR NEGATIVE 10/13/2015 1250   PROTEINUR NEGATIVE 10/13/2015 1250   NITRITE NEGATIVE 10/13/2015 1250   LEUKOCYTESUR NEGATIVE 10/13/2015 1250   Sepsis Labs: @LABRCNTIP (procalcitonin:4,lacticidven:4)  )No results found for this or any previous visit (from the past 240 hour(s)).   Invalid input(s): PROCALCITONIN, LACTICACIDVEN   Radiology Studies: Mr Lumbar Spine W Wo Contrast  Addendum Date: 04/03/2016   ADDENDUM REPORT: 04/03/2016 10:48 ADDENDUM: The original report was by Dr. Van Clines. The following addendum is by Dr. Van Clines: A 1.1 by 0.7 cm T1 precontrast hypointense lesion anteriorly in the right S1 vertebral body is new compared to the prior lumbar spine MRI and could represent an early bony metastatic lesion in this patient with history of widespread bony metastatic disease. These addendum will be called to the covering inpatient team by the Radiologist Assistant, and communication documented in the PACS or zVision Dashboard. Electronically Signed   By: Van Clines M.D.   On: 04/03/2016 10:48   Result Date: 04/03/2016 CLINICAL DATA:  Low back pain.  Various forms of  cancer in the past. EXAM: MRI LUMBAR SPINE WITHOUT AND WITH CONTRAST TECHNIQUE: Multiplanar and multiecho pulse sequences of the lumbar spine were obtained without and with intravenous contrast. CONTRAST:  37mL MULTIHANCE GADOBENATE DIMEGLUMINE 529 MG/ML IV SOLN COMPARISON:  09/01/2015 FINDINGS: Segmentation: The lowest lumbar type non-rib-bearing vertebra is labeled as L5. Alignment:  3 mm degenerative retrolisthesis at L4-5. Vertebrae: Type 3 degenerative endplate findings at [id number] and L4-5, with enhancement along the left eccentric L3-4 degenerative endplate findings. Schmorl's node along the inferior endplate of L3, increased from prior. Previously anterior enhancing lesion at L1 no longer appreciated. Previous enhancing lesion  along the superior endplate of 624THL no longer seen. Conus medullaris: Extends to the L1 level and appears normal. Paraspinal and other soft tissues: Unremarkable Disc levels: L1-2: No impingement, right facet arthropathy and focal ligamentum flavum redundancy. L2-3: Moderate central narrowing of the thecal sac with mild left foraminal stenosis and mild bilateral subarticular lateral recess stenosis due to disc bulge, left paracentral disc protrusion extending caudad, and facet arthropathy. Similar appearance to prior. L3-4: The patient has had a remote prior left laminectomy at this level. However, there is new abnormal enhancing epidural tissue at this level extending nearly circumferentially but especially anteriorly and eccentric to the left, tracking from the top of the L3 vertebral body nearly to the bottom of the L4 vertebral body in the left lateral recess. This tissue appears to be diffusely enhancing and extends into the left L3-4 neural foramen. Prominent left foraminal stenosis and moderate central narrowing of the thecal sac are present. The left L4 nerve roots course through some of this enhancing tissue in the lateral recess. L4-5: There is accentuated enhancement in the left neural foramen due to the epidural process extending into the left neural foramen. Moderate right and mild left foraminal stenosis. Moderate central narrowing of the thecal sac and mild bilateral subarticular lateral recess stenosis. L5-S1: Mild bilateral foraminal stenosis primarily from facet spurring, stable. IMPRESSION: 1. New abnormal epidural tissue enhancement centered at L3-4 and to the left, and extending both cephalad and caudad, possibly from tumor or epidural phlegmon/infection. The patient does have a left laminectomy at this level, but had this before without the epidural enhancing tissue, and accordingly epidural fibrosis as an explanation for this enhancing tissue seems unlikely. There is some worsening of the  endplate findings at X33443, especially eccentric to the left, with considerable endplate enhancement, although without high T2 signal and enhancement in the intervertebral disc to further favor discitis -osteomyelitis. Enhancement associated with this process extends into the left neural foramina at L3-4 and L4-5. Correlate with any fever/leukocytosis. In conjunction with the spondylosis and degenerative disc disease at this level, there is prominent left foraminal stenosis and moderate central narrowing of the thecal sac at L3-4. 2. Lumbar spondylosis and degenerative disc disease also causes moderate impingement at L2-3 and L4-5, and mild impingement at L5-S1. These results will be called to the ordering clinician or representative by the Radiologist Assistant, and communication documented in the PACS or zVision Dashboard. Electronically Signed: By: Van Clines M.D. On: 04/02/2016 16:12        Scheduled Meds: . sodium chloride   Intravenous Once  . citalopram  20 mg Oral BH-q7a  . diphenhydrAMINE  25 mg Oral Once  . docusate sodium  100 mg Oral BID  . letrozole  2.5 mg Oral Daily  . levothyroxine  100 mcg Oral QAC breakfast  . loratadine  10 mg Oral Daily  . mometasone-formoterol  2 puff Inhalation BID  . montelukast  10 mg Oral QHS  . nicotine  21 mg Transdermal Daily  . pantoprazole  40 mg Oral Daily  . [START ON 04/04/2016] pneumococcal 23 valent vaccine  0.5 mL Intramuscular Tomorrow-1000  . pravastatin  40 mg Oral QHS  . sucralfate  1 g Oral TID WC & HS   Continuous Infusions: . sodium chloride 75 mL/hr at 04/03/16 0600     LOS: 0 days    Time spent: 35 minutes    Brayten Komar A, MD [facility] Pager [phone]  If 7PM-7AM, please contact night-coverage [email or web address] Password [email or web address] 04/03/2016, 3:05 PM

## 2016-04-03 NOTE — Progress Notes (Signed)
Chief Complaint: Patient was seen in consultation today for disc aspiration/biopsy at the request of Dr. Verlee Monte  Referring Physician(s): Dr. Verlee Monte  Supervising Physician: [name]  Patient Status: Inpatient  History of Present Illness: Sara David is a 70 y.o. female admitted with severe low back pain. Her workup includes MRI which suggests new L3-L4 changes concerning for neoplastic changes vs phlegmon/infectious etiology. She has a hx of breast cancer with mets to the small bowel, gallbladder and T7 vertebrae IR is asked to eval for disc aspiration/tissue sampling Imaging reviewed by Dr. Rob Hickman  Past Medical History:  Diagnosis Date  . Breast cancer ([place]) 2012    no lumpectomy. Treating with meds  . Cancer ([place])    small intestines 2012, gallbladder 2016  . Family history of adverse reaction to anesthesia    daughter gets nauseated  . Hypertension     Past Surgical History:  Procedure Laterality Date  . ABDOMINAL HYSTERECTOMY    . BACK SURGERY    . BREAST BIOPSY Right 2012   positive  . BREAST BIOPSY Right 2014   positive  . BREAST EXCISIONAL BIOPSY Right 1995   benign  . CHOLECYSTECTOMY    . TOTAL HIP ARTHROPLASTY Right 10/03/2015   Procedure: TOTAL HIP ARTHROPLASTY ANTERIOR APPROACH;  Surgeon: Hessie Knows, MD;  Location: [facility] ORS;  Service: Orthopedics;  Laterality: Right;    Allergies: Carafate [sucralfate]; Diclofenac sodium; Erythromycin; Adhesive [tape]; and Wellbutrin [bupropion]  Medications:  Current Facility-Administered Medications:  .  0.9 %  sodium chloride infusion, , Intravenous, Once, Elnora Morrison, MD .  0.9 %  sodium chloride infusion, , Intravenous, Continuous, Maryann Mikhail, DO, Last Rate: 75 mL/hr at 04/03/16 0600 .  acetaminophen (TYLENOL) tablet 650 mg, 650 mg, Oral, Q6H PRN **OR** acetaminophen (TYLENOL) suppository 650 mg, 650 mg, Rectal, Q6H PRN, Maryann Mikhail, DO .  citalopram (CELEXA) tablet 20 mg, 20  mg, Oral, BH-q7a, Maryann Mikhail, DO, 20 mg at 04/03/16 [id number] .  docusate sodium (COLACE) capsule 100 mg, 100 mg, Oral, BID, Maryann Mikhail, DO, 100 mg at 04/03/16 1056 .  HYDROmorphone (DILAUDID) injection 1 mg, 1 mg, Intravenous, Q4H PRN, Verlee Monte, MD .  letrozole [facility]) tablet 2.5 mg, 2.5 mg, Oral, Daily, Maryann Mikhail, DO, 2.5 mg at 04/03/16 1057 .  levothyroxine (SYNTHROID, LEVOTHROID) tablet 100 mcg, 100 mcg, Oral, QAC breakfast, Maryann Mikhail, DO, 100 mcg at 04/03/16 0747 .  loratadine (CLARITIN) tablet 10 mg, 10 mg, Oral, Daily, Maryann Mikhail, DO .  methocarbamol (ROBAXIN) 500 mg in dextrose 5 % 50 mL IVPB, 500 mg, Intravenous, Q6H PRN, Maryann Mikhail, DO, 500 mg at 04/03/16 1253 .  mometasone-formoterol (DULERA) 100-5 MCG/ACT inhaler 2 puff, 2 puff, Inhalation, BID, Maryann Mikhail, DO, 2 puff at 04/03/16 0800 .  montelukast (SINGULAIR) tablet 10 mg, 10 mg, Oral, QHS, Maryann Mikhail, DO, 10 mg at 04/02/16 2245 .  nicotine (NICODERM CQ - dosed in mg/24 hours) patch 21 mg, 21 mg, Transdermal, Daily, Maryann Mikhail, DO, 21 mg at 04/03/16 1055 .  ondansetron (ZOFRAN) tablet 4 mg, 4 mg, Oral, Q6H PRN **OR** ondansetron (ZOFRAN) injection 4 mg, 4 mg, Intravenous, Q6H PRN, Maryann Mikhail, DO .  oxyCODONE-acetaminophen (PERCOCET/ROXICET) 5-325 MG per tablet 1-2 tablet, 1-2 tablet, Oral, Q4H PRN, Verlee Monte, MD, 2 tablet at 04/03/16 1255 .  pantoprazole (PROTONIX) EC tablet 40 mg, 40 mg, Oral, Daily, Maryann Mikhail, DO, 40 mg at 04/03/16 1055 .  [START ON 04/04/2016] pneumococcal 23 valent vaccine (PNU-IMMUNE) injection 0.5  mL, 0.5 mL, Intramuscular, Tomorrow-1000, Mutaz Elmahi, MD .  polyethylene glycol (MIRALAX / GLYCOLAX) packet 17 g, 17 g, Oral, Daily PRN, Maryann Mikhail, DO .  pravastatin (PRAVACHOL) tablet 40 mg, 40 mg, Oral, QHS, Maryann Mikhail, DO, 40 mg at 04/02/16 2245 .  sucralfate (CARAFATE) tablet 1 g, 1 g, Oral, TID WC & HS, Maryann Mikhail, DO, 1 g at 04/03/16  1253 .  zolpidem (AMBIEN) tablet 5 mg, 5 mg, Oral, QHS PRN, Maryann Mikhail, DO, 5 mg at 04/02/16 2245    Family History  Problem Relation Age of Onset  . Lung cancer Brother 3  . Heart disease Father   . Diabetes Father     Social History   Social History  . Marital status: Married    Spouse name: N/A  . Number of children: N/A  . Years of education: N/A   Social History Main Topics  . Smoking status: Current Every Day Smoker    Packs/day: 0.50    Types: Cigarettes  . Smokeless tobacco: Never Used  . Alcohol use No  . Drug use: No  . Sexual activity: Not Asked   Other Topics Concern  . None   Social History Narrative  . None     Review of Systems: A 12 point ROS discussed and pertinent positives are indicated in the HPI above.  All other systems are negative.  Review of Systems  Vital Signs: BP (!) 113/52   Pulse (!) 59   Temp 98.2 F (36.8 C)   Resp 17   Wt 148 lb (67.1 kg)   SpO2 97%   BMI 25.40 kg/m   Physical Exam  Constitutional: She is oriented to person, place, and time. She appears well-developed and well-nourished. No distress.  HENT:  Head: Normocephalic.  Mouth/Throat: Oropharynx is clear and moist.  Neck: Normal range of motion. No JVD present. No tracheal deviation present.  Cardiovascular: Normal rate, regular rhythm and normal heart sounds.   Pulmonary/Chest: Effort normal and breath sounds normal. No respiratory distress.  Neurological: She is alert and oriented to person, place, and time.  Skin: Skin is warm and dry.  Psychiatric: She has a normal mood and affect. Judgment normal.      Mallampati Score:  MD Evaluation Airway: WNL Heart: WNL Abdomen: WNL Chest/ Lungs: WNL ASA  Classification: 2 Mallampati/Airway Score: One  Imaging: Mr Lumbar Spine W Wo Contrast  Addendum Date: 04/03/2016   ADDENDUM REPORT: 04/03/2016 10:48 ADDENDUM: The original report was by Dr. Van Clines. The following addendum is by Dr.  Van Clines: A 1.1 by 0.7 cm T1 precontrast hypointense lesion anteriorly in the right S1 vertebral body is new compared to the prior lumbar spine MRI and could represent an early bony metastatic lesion in this patient with history of widespread bony metastatic disease. These addendum will be called to the covering inpatient team by the Radiologist Assistant, and communication documented in the PACS or zVision Dashboard. Electronically Signed   By: Van Clines M.D.   On: 04/03/2016 10:48   Result Date: 04/03/2016 CLINICAL DATA:  Low back pain.  Various forms of cancer in the past. EXAM: MRI LUMBAR SPINE WITHOUT AND WITH CONTRAST TECHNIQUE: Multiplanar and multiecho pulse sequences of the lumbar spine were obtained without and with intravenous contrast. CONTRAST:  33mL MULTIHANCE GADOBENATE DIMEGLUMINE 529 MG/ML IV SOLN COMPARISON:  09/01/2015 FINDINGS: Segmentation: The lowest lumbar type non-rib-bearing vertebra is labeled as L5. Alignment:  3 mm degenerative retrolisthesis at L4-5. Vertebrae: Type 3 degenerative  endplate findings at [id number] and L4-5, with enhancement along the left eccentric L3-4 degenerative endplate findings. Schmorl's node along the inferior endplate of L3, increased from prior. Previously anterior enhancing lesion at L1 no longer appreciated. Previous enhancing lesion along the superior endplate of 624THL no longer seen. Conus medullaris: Extends to the L1 level and appears normal. Paraspinal and other soft tissues: Unremarkable Disc levels: L1-2: No impingement, right facet arthropathy and focal ligamentum flavum redundancy. L2-3: Moderate central narrowing of the thecal sac with mild left foraminal stenosis and mild bilateral subarticular lateral recess stenosis due to disc bulge, left paracentral disc protrusion extending caudad, and facet arthropathy. Similar appearance to prior. L3-4: The patient has had a remote prior left laminectomy at this level. However, there is new  abnormal enhancing epidural tissue at this level extending nearly circumferentially but especially anteriorly and eccentric to the left, tracking from the top of the L3 vertebral body nearly to the bottom of the L4 vertebral body in the left lateral recess. This tissue appears to be diffusely enhancing and extends into the left L3-4 neural foramen. Prominent left foraminal stenosis and moderate central narrowing of the thecal sac are present. The left L4 nerve roots course through some of this enhancing tissue in the lateral recess. L4-5: There is accentuated enhancement in the left neural foramen due to the epidural process extending into the left neural foramen. Moderate right and mild left foraminal stenosis. Moderate central narrowing of the thecal sac and mild bilateral subarticular lateral recess stenosis. L5-S1: Mild bilateral foraminal stenosis primarily from facet spurring, stable. IMPRESSION: 1. New abnormal epidural tissue enhancement centered at L3-4 and to the left, and extending both cephalad and caudad, possibly from tumor or epidural phlegmon/infection. The patient does have a left laminectomy at this level, but had this before without the epidural enhancing tissue, and accordingly epidural fibrosis as an explanation for this enhancing tissue seems unlikely. There is some worsening of the endplate findings at X33443, especially eccentric to the left, with considerable endplate enhancement, although without high T2 signal and enhancement in the intervertebral disc to further favor discitis -osteomyelitis. Enhancement associated with this process extends into the left neural foramina at L3-4 and L4-5. Correlate with any fever/leukocytosis. In conjunction with the spondylosis and degenerative disc disease at this level, there is prominent left foraminal stenosis and moderate central narrowing of the thecal sac at L3-4. 2. Lumbar spondylosis and degenerative disc disease also causes moderate impingement at  L2-3 and L4-5, and mild impingement at L5-S1. These results will be called to the ordering clinician or representative by the Radiologist Assistant, and communication documented in the PACS or zVision Dashboard. Electronically Signed: By: Van Clines M.D. On: 04/02/2016 16:12    Labs:  CBC:  Recent Labs  10/17/15 0945 02/26/16 1017 04/02/16 1350 04/03/16 0519  WBC 6.8 5.7 5.0 5.2  HGB 10.0* 12.5 10.8* 9.8*  HCT 29.0* 36.5 34.8* 30.4*  PLT 377 285 176 168    COAGS:  Recent Labs  09/27/15 1513  INR 0.95  APTT 31    BMP:  Recent Labs  10/17/15 0945 02/26/16 1017 04/02/16 1350 04/03/16 0519  NA 134* 137 137 138  K 4.1 3.8 3.2* 3.5  CL 102 104 103 105  CO2 25 26 26 25   GLUCOSE 107* 89 130* 140*  BUN 19 23* 14 12  CALCIUM 8.7* 9.2 9.2 8.7*  CREATININE 0.95 0.92 1.09* 0.90  GFRNONAA 60* >60 50* >60  GFRAA >60 >60 58* >60  LIVER FUNCTION TESTS:  Recent Labs  08/23/15 1034 02/26/16 1017 04/03/16 0519  BILITOT 0.4 0.4 0.3  AST 19 22 52*  ALT 20 17 54  ALKPHOS 66 97 80  PROT 7.2 7.1 6.0*  ALBUMIN 4.0 4.1 3.0*    TUMOR MARKERS: No results for input(s): AFPTM, CEA, CA199, CHROMGRNA in the last 8760 hours.  Assessment and Plan: Back pain L3-L4 inflammatory changes on MRI Plan for image guided disc aspiration/biopsy tomorrow NPO p MN No thinners please. Procedure, risks, complications, use of sedation discussed. Consent obtained.  Thank you for this interesting consult.  I greatly enjoyed meeting [employer] and look forward to participating in their care.  A copy of this report was sent to the requesting provider on this date.  Electronically Signed: Ascencion Dike 04/03/2016, 2:23 PM   I spent a total of 20 minutes in face to face in clinical consultation, greater than 50% of which was counseling/coordinating care for disc aspiration

## 2016-04-04 ENCOUNTER — Inpatient Hospital Stay (HOSPITAL_COMMUNITY): Payer: Medicare Other

## 2016-04-04 DIAGNOSIS — I10 Essential (primary) hypertension: Secondary | ICD-10-CM

## 2016-04-04 DIAGNOSIS — Z23 Encounter for immunization: Secondary | ICD-10-CM | POA: Diagnosis not present

## 2016-04-04 DIAGNOSIS — Z853 Personal history of malignant neoplasm of breast: Secondary | ICD-10-CM

## 2016-04-04 DIAGNOSIS — R509 Fever, unspecified: Secondary | ICD-10-CM

## 2016-04-04 DIAGNOSIS — M545 Low back pain: Secondary | ICD-10-CM

## 2016-04-04 DIAGNOSIS — Z8589 Personal history of malignant neoplasm of other organs and systems: Secondary | ICD-10-CM

## 2016-04-04 HISTORY — PX: IR GENERIC HISTORICAL: IMG1180011

## 2016-04-04 LAB — BASIC METABOLIC PANEL
ANION GAP: 7 (ref 5–15)
BUN: 14 mg/dL (ref 6–20)
CALCIUM: 8.4 mg/dL — AB (ref 8.9–10.3)
CO2: 21 mmol/L — ABNORMAL LOW (ref 22–32)
CREATININE: 1.05 mg/dL — AB (ref 0.44–1.00)
Chloride: 109 mmol/L (ref 101–111)
GFR, EST NON AFRICAN AMERICAN: 53 mL/min — AB (ref >60)
Glucose, Bld: 110 mg/dL — ABNORMAL HIGH (ref 65–99)
Potassium: 4.3 mmol/L (ref 3.5–5.1)
SODIUM: 137 mmol/L (ref 135–145)

## 2016-04-04 LAB — PROTIME-INR
INR: 1.03
PROTHROMBIN TIME: 13.5 s (ref 11.4–15.2)

## 2016-04-04 MED ORDER — HYDROMORPHONE HCL 1 MG/ML IJ SOLN
INTRAMUSCULAR | Status: AC
  Filled 2016-04-04: qty 1

## 2016-04-04 MED ORDER — BUPIVACAINE HCL (PF) 0.25 % IJ SOLN
INTRAMUSCULAR | Status: AC
  Administered 2016-04-04: 15 mL
  Filled 2016-04-04: qty 30

## 2016-04-04 MED ORDER — FENTANYL CITRATE (PF) 100 MCG/2ML IJ SOLN
INTRAMUSCULAR | Status: AC
  Filled 2016-04-04: qty 2

## 2016-04-04 MED ORDER — MIDAZOLAM HCL 2 MG/2ML IJ SOLN
INTRAMUSCULAR | Status: AC
  Filled 2016-04-04: qty 2

## 2016-04-04 MED ORDER — MIDAZOLAM HCL 2 MG/2ML IJ SOLN
INTRAMUSCULAR | Status: AC | PRN
  Administered 2016-04-04: 1 mg via INTRAVENOUS

## 2016-04-04 MED ORDER — FENTANYL CITRATE (PF) 100 MCG/2ML IJ SOLN
INTRAMUSCULAR | Status: AC | PRN
  Administered 2016-04-04: 25 ug via INTRAVENOUS

## 2016-04-04 MED ORDER — VANCOMYCIN HCL IN DEXTROSE 750-5 MG/150ML-% IV SOLN
750.0000 mg | Freq: Two times a day (BID) | INTRAVENOUS | Status: DC
  Administered 2016-04-04 – 2016-04-07 (×6): 750 mg via INTRAVENOUS
  Filled 2016-04-04 (×7): qty 150

## 2016-04-04 MED ORDER — CEFTRIAXONE SODIUM 2 G IJ SOLR
2.0000 g | INTRAMUSCULAR | Status: DC
  Administered 2016-04-04 – 2016-04-11 (×8): 2 g via INTRAVENOUS
  Filled 2016-04-04 (×8): qty 2

## 2016-04-04 NOTE — Consult Note (Signed)
[place] [facility]    Date of Admission:  04/02/2016          Reason for Consult: Fever and acute on chronic low back pain with epidural enhancement at L3-4   Referring Physician: Dr. Verlee Monte  Principal Problem:   Acute low back pain Active Problems:   Fever   Gastroesophageal reflux disease   Hypothyroidism   Breast cancer metastasized to multiple sites [facility])   Malignant neoplasm of upper-outer quadrant of right female breast ([place])   Benign essential HTN   . citalopram  20 mg Oral BH-q7a  . docusate sodium  100 mg Oral BID  . letrozole  2.5 mg Oral Daily  . levothyroxine  100 mcg Oral QAC breakfast  . loratadine  10 mg Oral Daily  . mometasone-formoterol  2 puff Inhalation BID  . montelukast  10 mg Oral QHS  . nicotine  21 mg Transdermal Daily  . pantoprazole  40 mg Oral Daily  . pneumococcal 23 valent vaccine  0.5 mL Intramuscular Tomorrow-1000  . pravastatin  40 mg Oral QHS  . sucralfate  1 g Oral TID WC & HS    Recommendations: 1. Start IV vancomycin per protocol and ceftriaxone 2 g IV daily after lumbar aspirate/biopsy   Assessment: Given her fever, very acute onset of pain and MRI results I'm concerned about lumbar infection. I recommend starting empiric vancomycin and ceftriaxone after her aspirate/biopsy. Since metastatic tumor is also a concern I would hold off on PICC placement until we have her better idea of biopsy results.    HPI: Sara David is a 70 y.o. female with history of degenerative spine disease and metastatic breast cancer who developed acute on chronic low back pain several days ago after trying to get her 70 year old, demented mother out of the bathtub. The pain was 10+ out of 10 and radiated into her left buttock and down her left leg leading her to present to the emergency department where she was found to be febrile with a temperature of 102. MRI showed new epidural enhancement at L3-4. Admission blood  cultures remain negative. Her sedimentation rate and C-reactive protein are elevated. She is scheduled for lumbar aspirate/biopsy today.   Review of Systems: Review of Systems  Constitutional: Positive for chills, fever and malaise/fatigue. Negative for diaphoresis and weight loss.  HENT: Negative for sore throat.   Respiratory: Negative for cough, sputum production and shortness of breath.   Cardiovascular: Negative for chest pain.  Gastrointestinal: Positive for abdominal pain. Negative for diarrhea, nausea and vomiting.       She has chronic, intermittent abdominal cramping.  Genitourinary: Negative for dysuria and frequency.  Musculoskeletal: Positive for back pain. Negative for joint pain and myalgias.  Skin: Positive for itching. Negative for rash.       She has been itching since starting pain medications.  Neurological: Negative for sensory change, focal weakness and headaches.    Past Medical History:  Diagnosis Date  . Arthritis    "back" (04/03/2016)  . Breast cancer ([place]) 2012    no lumpectomy. Treating with meds  . Cancer ([place])    small intestines 2012, gallbladder 2016  . Chronic lower back pain   . COPD (chronic obstructive pulmonary disease) ([place])    "use inhalers for COPD that dx'd at one time" (04/03/2016)  . Family history of adverse reaction to anesthesia    daughter gets nauseated  . GERD (gastroesophageal  reflux disease)   . High cholesterol   . Hypertension   . Hypothyroidism   . Skin cancer    "right neck; mid forehead"    Social History  Substance Use Topics  . Smoking status: Current Every Day Smoker    Packs/day: 0.75    Years: 54.00    Types: Cigarettes  . Smokeless tobacco: Never Used  . Alcohol use No    Family History  Problem Relation Age of Onset  . Lung cancer Brother 90  . Heart disease Father   . Diabetes Father    Allergies  Allergen Reactions  . Carafate [Sucralfate] Nausea Only  . Diclofenac Sodium Nausea Only  .  Erythromycin Diarrhea and Nausea And Vomiting  . Adhesive [Tape] Itching and Rash  . Wellbutrin [Bupropion] Itching and Rash    OBJECTIVE: Blood pressure (!) 120/59, pulse 65, temperature 98.4 F (36.9 C), resp. rate 19, weight 148 lb (67.1 kg), SpO2 97 %.  Physical Exam  Constitutional:  She is quite groggy from pain medication and has difficulty staying awake during the exam.  HENT:  Mouth/Throat: No oropharyngeal exudate.  Neck: Neck supple.  Cardiovascular: Normal rate and regular rhythm.   No murmur heard. Pulmonary/Chest: Effort normal and breath sounds normal.  Abdominal: Soft. There is no tenderness.  Musculoskeletal:  She is laying very still in bed and periodically winces with severe back pain.  Skin: No rash noted.    Lab Results Lab Results  Component Value Date   WBC 5.2 04/03/2016   HGB 9.8 (L) 04/03/2016   HCT 30.4 (L) 04/03/2016   MCV 94.7 04/03/2016   PLT 168 04/03/2016    Lab Results  Component Value Date   CREATININE 1.05 (H) 04/04/2016   BUN 14 04/04/2016   NA 137 04/04/2016   K 4.3 04/04/2016   CL 109 04/04/2016   CO2 21 (L) 04/04/2016    Lab Results  Component Value Date   ALT 54 04/03/2016   AST 52 (H) 04/03/2016   ALKPHOS 80 04/03/2016   BILITOT 0.3 04/03/2016    Sed Rate (mm/hr)  Date Value  04/02/2016 60 (H)  09/27/2015 29   CRP (mg/dL)  Date Value  04/02/2016 9.4 (H)    Microbiology: Recent Results (from the past 240 hour(s))  Blood culture (routine x 2)     Status: None (Preliminary result)   Collection Time: 04/02/16  4:54 PM  Result Value Ref Range Status   Specimen Description BLOOD RIGHT HAND  Final   Special Requests BOTTLES DRAWN AEROBIC AND ANAEROBIC 5CC  Final   Culture NO GROWTH < 24 HOURS  Final   Report Status PENDING  Incomplete  Blood culture (routine x 2)     Status: None (Preliminary result)   Collection Time: 04/02/16  5:03 PM  Result Value Ref Range Status   Specimen Description BLOOD LEFT HAND  Final     Special Requests BOTTLES DRAWN AEROBIC AND ANAEROBIC 5CC  Final   Culture NO GROWTH < 24 HOURS  Final   Report Status PENDING  Incomplete    Michel Bickers, MD [place] [facility] [phone] pager   [phone] cell 04/04/2016, 10:14 AM

## 2016-04-04 NOTE — Progress Notes (Signed)
Patient arrived back to unit from Radiology. Lumbar site with band aid clean, dry, and intact.

## 2016-04-04 NOTE — Sedation Documentation (Signed)
Patient is resting comfortably. 

## 2016-04-04 NOTE — Progress Notes (Signed)
Pharmacy Antibiotic Note  Sara David is a 70 y.o. female admitted on 04/02/2016 with lumbar epidural infection.  Pharmacy has been consulted for Vancomycin dosing.  Plan: Vancomycin 750 mg iv Q 12 hours Follow up progress, Scr, cultures  Height: 5\' 4"  (162.6 cm) Weight: 160 lb (72.6 kg) IBW/kg (Calculated) : 54.7  Temp (24hrs), Avg:98.5 F (36.9 C), Min:98.4 F (36.9 C), Max:98.5 F (36.9 C)   Recent Labs Lab 04/02/16 1350 04/03/16 0519 04/04/16 0249  WBC 5.0 5.2  --   CREATININE 1.09* 0.90 1.05*    Estimated Creatinine Clearance: 48.7 mL/min (by C-G formula based on SCr of 1.05 mg/dL).    Allergies  Allergen Reactions  . Carafate [Sucralfate] Nausea Only  . Diclofenac Sodium Nausea Only  . Erythromycin Diarrhea and Nausea And Vomiting  . Adhesive [Tape] Itching and Rash  . Wellbutrin [Bupropion] Itching and Rash     Thank you for allowing pharmacy to be a part of this patient's care. Anette Guarneri, PharmD [phone] 04/04/2016 4:41 PM

## 2016-04-04 NOTE — Progress Notes (Signed)
PROGRESS NOTE  Sara David  [id number] DOB: 08-07-1946 DOA: 04/02/2016 PCP: Marinda Elk, MD Outpatient Specialists:  Subjective: Seen with her husband at bedside, although she is very sleepy (this is likely secondary to pain medications) she was still complaining about severe pain in her back. Scheduled today for L3-4 aspiration/biopsy.  Brief Narrative:  Sara David is a 70 y.o. female with a medical history of metastatic breast cancer, hypertension, presented to the emergency department with complaints of back pain that has been worsening since Saturday. Patient states she's had back pain intermittently for the last 3 years however Saturday she began to have very sharp pain located in her lower back radiating to her left buttock. Patient was taking her pain medication without resolution of her symptoms or any improvement in her back pain. Patient did try to see her pain management doctor however could not obtain an appointment until next Friday. Currently she denies any chest pain, shortness of breath, abdominal pain, nausea or vomiting, diarrhea, dizziness or headache. She denies recent travel, or contact with ill persons. She does admit to trying to help her mother out of the shower on Saturday. Patient does endorse feeling warm and hired as well as having constipation.   Assessment & Plan:   Principal Problem:   Acute low back pain Active Problems:   Gastroesophageal reflux disease   Hypothyroidism   Breast cancer metastasized to multiple sites [facility])   Malignant neoplasm of upper-outer quadrant of right female breast ([facility])   Benign essential HTN   Fever   Acute on chronic lower back pain, possible discitis -MRI lumbar spine: new abnormal epidural tissue L3-4 and to the left, ?discitis vs osteomyelitis -Patient has no leukocytosis, high CRP and sedimentation rate, had fever of 102.1 last night in the ED. -Continue to hold antibiotics. -Blood cultures obtained. -IR for  L3-4 aspiration/biopsy. -She was very groggy this morning, I will discontinue the Robaxin, opioids as needed.  Metastatic breast cancer -Sees Dr. Grayland Ormond [facility]) -Continue letrozole  Essential Hypertension -Recently taken off of her blood pressure medications one week ago due to hypotension -Blood pressure currently stable, will start IV pain medications as needed  Chronic normocytic anemia -Baseline hemoglobin between 10 and 11, currently 10.8 -Continue to monitor CBC  Tobacco abuse -Discussed smoking cessation -Nicotine patch  Hypokalemia -Replace and continue to monitor BMP  Mild acute kidney injury -Creatinine currently 1.09, baseline appears to be 0.7-0.8 -This is back to normal at 0.9 after IV fluid hydration.  Constipation -Continue MiraLAX  Hypothyroidism -Continue Synthroid  GERD -Continue PPI   DVT prophylaxis: SCDs. Code Status: DNR Family Communication:  Disposition Plan:  Diet: Diet NPO time specified  Consultants:   IR.  ID  Procedures:   None  Antimicrobials:   None  Objective: Vitals:   04/03/16 2014 04/03/16 2212 04/04/16 0405 04/04/16 0946  BP:  128/63 (!) 120/59   Pulse:   65   Resp:  20 19   Temp:  98.5 F (36.9 C) 98.4 F (36.9 C)   TempSrc:      SpO2: 98% 98% 93% 97%  Weight:        Intake/Output Summary (Last 24 hours) at 04/04/16 1107 Last data filed at 04/04/16 0900  Gross per 24 hour  Intake          1996.25 ml  Output             2650 ml  Net          -  653.75 ml   Filed Weights   04/02/16 1237  Weight: 67.1 kg (148 lb)    Examination: General exam: Appears calm and comfortable  Respiratory system: Clear to auscultation. Respiratory effort normal. Cardiovascular system: S1 & S2 heard, RRR. No JVD, murmurs, rubs, gallops or clicks. No pedal edema. Gastrointestinal system: Abdomen is nondistended, soft and nontender. No organomegaly or masses felt. Normal bowel sounds heard. Central  nervous system: Alert and oriented. No focal neurological deficits. Extremities: Symmetric 5 x 5 power. Skin: No rashes, lesions or ulcers Psychiatry: Judgement and insight appear normal. Mood & affect appropriate.   Data Reviewed: I have personally reviewed following labs and imaging studies  CBC:  Recent Labs Lab 04/02/16 1350 04/03/16 0519  WBC 5.0 5.2  HGB 10.8* 9.8*  HCT 34.8* 30.4*  MCV 96.7 94.7  PLT 176 [id number]   Basic Metabolic Panel:  Recent Labs Lab 04/02/16 1350 04/03/16 0519 04/04/16 0249  NA 137 138 137  K 3.2* 3.5 4.3  CL 103 105 109  CO2 26 25 21*  GLUCOSE 130* 140* 110*  BUN 14 12 14   CREATININE 1.09* 0.90 1.05*  CALCIUM 9.2 8.7* 8.4*   GFR: CrCl cannot be calculated (Unknown ideal weight.). Liver Function Tests:  Recent Labs Lab 04/03/16 0519  AST 52*  ALT 54  ALKPHOS 80  BILITOT 0.3  PROT 6.0*  ALBUMIN 3.0*   No results for input(s): LIPASE, AMYLASE in the last 168 hours. No results for input(s): AMMONIA in the last 168 hours. Coagulation Profile:  Recent Labs Lab 04/04/16 0249  INR 1.03   Cardiac Enzymes: No results for input(s): CKTOTAL, CKMB, CKMBINDEX, TROPONINI in the last 168 hours. BNP (last 3 results) No results for input(s): PROBNP in the last 8760 hours. HbA1C: No results for input(s): HGBA1C in the last 72 hours. CBG: No results for input(s): GLUCAP in the last 168 hours. Lipid Profile: No results for input(s): CHOL, HDL, LDLCALC, TRIG, CHOLHDL, LDLDIRECT in the last 72 hours. Thyroid Function Tests: No results for input(s): TSH, T4TOTAL, FREET4, T3FREE, THYROIDAB in the last 72 hours. Anemia Panel: No results for input(s): VITAMINB12, FOLATE, FERRITIN, TIBC, IRON, RETICCTPCT in the last 72 hours. Urine analysis:    Component Value Date/Time   COLORURINE YELLOW (A) 10/13/2015 1250   APPEARANCEUR TURBID (A) 10/13/2015 1250   LABSPEC 1.023 10/13/2015 1250   PHURINE 5.0 10/13/2015 1250   GLUCOSEU NEGATIVE 10/13/2015  1250   HGBUR NEGATIVE 10/13/2015 1250   BILIRUBINUR NEGATIVE 10/13/2015 1250   KETONESUR NEGATIVE 10/13/2015 1250   PROTEINUR NEGATIVE 10/13/2015 1250   NITRITE NEGATIVE 10/13/2015 1250   LEUKOCYTESUR NEGATIVE 10/13/2015 1250   Sepsis Labs: @LABRCNTIP (procalcitonin:4,lacticidven:4)  ) Recent Results (from the past 240 hour(s))  Blood culture (routine x 2)     Status: None (Preliminary result)   Collection Time: 04/02/16  4:54 PM  Result Value Ref Range Status   Specimen Description BLOOD RIGHT HAND  Final   Special Requests BOTTLES DRAWN AEROBIC AND ANAEROBIC 5CC  Final   Culture NO GROWTH < 24 HOURS  Final   Report Status PENDING  Incomplete  Blood culture (routine x 2)     Status: None (Preliminary result)   Collection Time: 04/02/16  5:03 PM  Result Value Ref Range Status   Specimen Description BLOOD LEFT HAND  Final   Special Requests BOTTLES DRAWN AEROBIC AND ANAEROBIC 5CC  Final   Culture NO GROWTH < 24 HOURS  Final   Report Status PENDING  Incomplete  Invalid input(s): PROCALCITONIN, LACTICACIDVEN   Radiology Studies: Mr Lumbar Spine W Wo Contrast  Addendum Date: 04/03/2016   ADDENDUM REPORT: 04/03/2016 10:48 ADDENDUM: The original report was by Dr. Van Clines. The following addendum is by Dr. Van Clines: A 1.1 by 0.7 cm T1 precontrast hypointense lesion anteriorly in the right S1 vertebral body is new compared to the prior lumbar spine MRI and could represent an early bony metastatic lesion in this patient with history of widespread bony metastatic disease. These addendum will be called to the covering inpatient team by the Radiologist Assistant, and communication documented in the PACS or zVision Dashboard. Electronically Signed   By: Van Clines M.D.   On: 04/03/2016 10:48   Result Date: 04/03/2016 CLINICAL DATA:  Low back pain.  Various forms of cancer in the past. EXAM: MRI LUMBAR SPINE WITHOUT AND WITH CONTRAST TECHNIQUE: Multiplanar and  multiecho pulse sequences of the lumbar spine were obtained without and with intravenous contrast. CONTRAST:  86mL MULTIHANCE GADOBENATE DIMEGLUMINE 529 MG/ML IV SOLN COMPARISON:  09/01/2015 FINDINGS: Segmentation: The lowest lumbar type non-rib-bearing vertebra is labeled as L5. Alignment:  3 mm degenerative retrolisthesis at L4-5. Vertebrae: Type 3 degenerative endplate findings at [id number] and L4-5, with enhancement along the left eccentric L3-4 degenerative endplate findings. Schmorl's node along the inferior endplate of L3, increased from prior. Previously anterior enhancing lesion at L1 no longer appreciated. Previous enhancing lesion along the superior endplate of [id number] no longer seen. Conus medullaris: Extends to the L1 level and appears normal. Paraspinal and other soft tissues: Unremarkable Disc levels: L1-2: No impingement, right facet arthropathy and focal ligamentum flavum redundancy. L2-3: Moderate central narrowing of the thecal sac with mild left foraminal stenosis and mild bilateral subarticular lateral recess stenosis due to disc bulge, left paracentral disc protrusion extending caudad, and facet arthropathy. Similar appearance to prior. L3-4: The patient has had a remote prior left laminectomy at this level. However, there is new abnormal enhancing epidural tissue at this level extending nearly circumferentially but especially anteriorly and eccentric to the left, tracking from the top of the L3 vertebral body nearly to the bottom of the L4 vertebral body in the left lateral recess. This tissue appears to be diffusely enhancing and extends into the left L3-4 neural foramen. Prominent left foraminal stenosis and moderate central narrowing of the thecal sac are present. The left L4 nerve roots course through some of this enhancing tissue in the lateral recess. L4-5: There is accentuated enhancement in the left neural foramen due to the epidural process extending into the left neural foramen. Moderate  right and mild left foraminal stenosis. Moderate central narrowing of the thecal sac and mild bilateral subarticular lateral recess stenosis. L5-S1: Mild bilateral foraminal stenosis primarily from facet spurring, stable. IMPRESSION: 1. New abnormal epidural tissue enhancement centered at L3-4 and to the left, and extending both cephalad and caudad, possibly from tumor or epidural phlegmon/infection. The patient does have a left laminectomy at this level, but had this before without the epidural enhancing tissue, and accordingly epidural fibrosis as an explanation for this enhancing tissue seems unlikely. There is some worsening of the endplate findings at [id number], especially eccentric to the left, with considerable endplate enhancement, although without high T2 signal and enhancement in the intervertebral disc to further favor discitis -osteomyelitis. Enhancement associated with this process extends into the left neural foramina at L3-4 and L4-5. Correlate with any fever/leukocytosis. In conjunction with the spondylosis and degenerative disc disease at this level, there is prominent  left foraminal stenosis and moderate central narrowing of the thecal sac at L3-4. 2. Lumbar spondylosis and degenerative disc disease also causes moderate impingement at L2-3 and L4-5, and mild impingement at L5-S1. These results will be called to the ordering clinician or representative by the Radiologist Assistant, and communication documented in the PACS or zVision Dashboard. Electronically Signed: By: Van Clines M.D. On: 04/02/2016 16:12        Scheduled Meds: . citalopram  20 mg Oral BH-q7a  . docusate sodium  100 mg Oral BID  . letrozole  2.5 mg Oral Daily  . levothyroxine  100 mcg Oral QAC breakfast  . loratadine  10 mg Oral Daily  . mometasone-formoterol  2 puff Inhalation BID  . montelukast  10 mg Oral QHS  . nicotine  21 mg Transdermal Daily  . pantoprazole  40 mg Oral Daily  . pravastatin  40 mg Oral QHS   . sucralfate  1 g Oral TID WC & HS   Continuous Infusions: . sodium chloride 75 mL/hr at 04/04/16 0213     LOS: 1 day    Time spent: 35 minutes    Baley Lorimer A, MD [facility] Pager [phone]  If 7PM-7AM, please contact night-coverage [email or web address] Password [email or web address] 04/04/2016, 11:07 AM

## 2016-04-04 NOTE — Sedation Documentation (Signed)
Patient denies pain and is resting comfortably.  

## 2016-04-05 DIAGNOSIS — Z9889 Other specified postprocedural states: Secondary | ICD-10-CM

## 2016-04-05 DIAGNOSIS — M4647 Discitis, unspecified, lumbosacral region: Secondary | ICD-10-CM

## 2016-04-05 DIAGNOSIS — M4646 Discitis, unspecified, lumbar region: Secondary | ICD-10-CM

## 2016-04-05 LAB — CBC
HCT: 31.4 % — ABNORMAL LOW (ref 36.0–46.0)
Hemoglobin: 9.9 g/dL — ABNORMAL LOW (ref 12.0–15.0)
MCH: 30.7 pg (ref 26.0–34.0)
MCHC: 31.5 g/dL (ref 30.0–36.0)
MCV: 97.2 fL (ref 78.0–100.0)
PLATELETS: 201 10*3/uL (ref 150–400)
RBC: 3.23 MIL/uL — AB (ref 3.87–5.11)
RDW: 13.8 % (ref 11.5–15.5)
WBC: 6.2 10*3/uL (ref 4.0–10.5)

## 2016-04-05 LAB — BASIC METABOLIC PANEL
Anion gap: 6 (ref 5–15)
BUN: 10 mg/dL (ref 6–20)
CO2: 24 mmol/L (ref 22–32)
Calcium: 8.2 mg/dL — ABNORMAL LOW (ref 8.9–10.3)
Chloride: 106 mmol/L (ref 101–111)
Creatinine, Ser: 0.87 mg/dL (ref 0.44–1.00)
GFR calc Af Amer: >60 mL/min (ref >60)
Glucose, Bld: 105 mg/dL — ABNORMAL HIGH (ref 65–99)
POTASSIUM: 4.2 mmol/L (ref 3.5–5.1)
SODIUM: 136 mmol/L (ref 135–145)

## 2016-04-05 MED ORDER — MAGNESIUM HYDROXIDE 400 MG/5ML PO SUSP
30.0000 mL | Freq: Once | ORAL | Status: AC
  Administered 2016-04-05: 30 mL via ORAL
  Filled 2016-04-05: qty 30

## 2016-04-05 MED ORDER — ALPRAZOLAM 0.5 MG PO TABS
0.5000 mg | ORAL_TABLET | Freq: Three times a day (TID) | ORAL | Status: DC | PRN
  Administered 2016-04-06 – 2016-04-10 (×6): 0.5 mg via ORAL
  Filled 2016-04-05 (×7): qty 1

## 2016-04-05 MED ORDER — POLYETHYLENE GLYCOL 3350 17 G PO PACK
17.0000 g | PACK | Freq: Every day | ORAL | Status: DC
  Administered 2016-04-06 – 2016-04-10 (×4): 17 g via ORAL
  Filled 2016-04-05 (×6): qty 1

## 2016-04-05 NOTE — Progress Notes (Signed)
PROGRESS NOTE  Sara David  [id number] DOB: [birth year] DOA: 04/02/2016 PCP: Marinda Elk, MD Outpatient Specialists:  Subjective: Patient still reporting very severe pain, very emotional when she talks about her pain. I appreciate Dr. Hale Bogus help, on vancomycin and Rocephin, PICC to be placed.  Brief Narrative:  Sara David is a 70 y.o. female with a medical history of metastatic breast cancer, hypertension, presented to the emergency department with complaints of back pain that has been worsening since Saturday. Patient states she's had back pain intermittently for the last 3 years however Saturday she began to have very sharp pain located in her lower back radiating to her left buttock. Patient was taking her pain medication without resolution of her symptoms or any improvement in her back pain. Patient did try to see her pain management doctor however could not obtain an appointment until next Friday. Currently she denies any chest pain, shortness of breath, abdominal pain, nausea or vomiting, diarrhea, dizziness or headache. She denies recent travel, or contact with ill persons. She does admit to trying to help her mother out of the shower on Saturday. Patient does endorse feeling warm and hired as well as having constipation.   Assessment & Plan:   Principal Problem:   Acute low back pain Active Problems:   Gastroesophageal reflux disease   Hypothyroidism   Breast cancer metastasized to multiple sites [facility])   Malignant neoplasm of upper-outer quadrant of right female breast (HCC)   Benign essential HTN   Fever   Acute on chronic lower back pain, possible discitis -MRI lumbar spine: new abnormal epidural tissue L3-4 and to the left, ?discitis vs osteomyelitis -Patient has no leukocytosis, high CRP and sedimentation rate, had fever of 102.1 last night in the ED. -Continue to hold antibiotics. -Blood cultures obtained. -IR did L3-L4 aspiration/biopsy, showed PMNs but  no organisms. -ID started Rocephin and vancomycin, continue, asked for PICC placement. -I added Xanax as needed, it might help with muscle relaxation and I think there is an anxiety component to her pain.  Metastatic breast cancer -Sees Dr. Grayland Ormond [facility]) -Continue letrozole  Essential Hypertension -Recently taken off of her blood pressure medications one week ago due to hypotension -Blood pressure currently stable, will start IV pain medications as needed  Chronic normocytic anemia -Baseline hemoglobin between 10 and 11, currently 10.8 -Continue to monitor CBC  Tobacco abuse -Discussed smoking cessation -Nicotine patch  Hypokalemia -Place with oral supplements, this is back to normal.  Mild acute kidney injury -Creatinine currently 1.09, baseline appears to be 0.7-0.8 -This is back to normal at 0.9 after IV fluid hydration.  Constipation -Continue MiraLAX  Hypothyroidism -Continue Synthroid  GERD -Continue PPI   DVT prophylaxis: SCDs. Code Status: DNR Family Communication:  Disposition Plan:  Diet: Diet regular Room service appropriate? Yes; Fluid consistency: Thin  Consultants:   IR.  ID  Procedures:   None  Antimicrobials:   None  Objective: Vitals:   04/04/16 1610 04/04/16 1928 04/04/16 2108 04/05/16 0539  BP:   (!) 108/59 (!) 116/45  Pulse:   76 77  Resp:   18 18  Temp:   98.5 F (36.9 C) 99 F (37.2 C)  TempSrc:   Oral Oral  SpO2:  92% 100% 95%  Weight: 72.6 kg (160 lb)     Height: 5\' 4"  (1.626 m)       Intake/Output Summary (Last 24 hours) at 04/05/16 1237 Last data filed at 04/05/16 0542  Gross per 24 hour  Intake  2278.75 ml  Output             1250 ml  Net          1028.75 ml   Filed Weights   04/02/16 1237 04/04/16 1610  Weight: 67.1 kg (148 lb) 72.6 kg (160 lb)    Examination: General exam: Appears calm and comfortable  Respiratory system: Clear to auscultation. Respiratory effort  normal. Cardiovascular system: S1 & S2 heard, RRR. No JVD, murmurs, rubs, gallops or clicks. No pedal edema. Gastrointestinal system: Abdomen is nondistended, soft and nontender. No organomegaly or masses felt. Normal bowel sounds heard. Central nervous system: Alert and oriented. No focal neurological deficits. Extremities: Symmetric 5 x 5 power. Skin: No rashes, lesions or ulcers Psychiatry: Judgement and insight appear normal. Mood & affect appropriate.   Data Reviewed: I have personally reviewed following labs and imaging studies  CBC:  Recent Labs Lab 04/02/16 1350 04/03/16 0519 04/05/16 0445  WBC 5.0 5.2 6.2  HGB 10.8* 9.8* 9.9*  HCT 34.8* 30.4* 31.4*  MCV 96.7 94.7 97.2  PLT 176 168 [id number]   Basic Metabolic Panel:  Recent Labs Lab 04/02/16 1350 04/03/16 0519 04/04/16 0249 04/05/16 0445  NA 137 138 137 136  K 3.2* 3.5 4.3 4.2  CL 103 105 109 106  CO2 26 25 21* 24  GLUCOSE 130* 140* 110* 105*  BUN 14 12 14 10   CREATININE 1.09* 0.90 1.05* 0.87  CALCIUM 9.2 8.7* 8.4* 8.2*   GFR: Estimated Creatinine Clearance: 58.8 mL/min (by C-G formula based on SCr of 0.87 mg/dL). Liver Function Tests:  Recent Labs Lab 04/03/16 0519  AST 52*  ALT 54  ALKPHOS 80  BILITOT 0.3  PROT 6.0*  ALBUMIN 3.0*   No results for input(s): LIPASE, AMYLASE in the last 168 hours. No results for input(s): AMMONIA in the last 168 hours. Coagulation Profile:  Recent Labs Lab 04/04/16 0249  INR 1.03   Cardiac Enzymes: No results for input(s): CKTOTAL, CKMB, CKMBINDEX, TROPONINI in the last 168 hours. BNP (last 3 results) No results for input(s): PROBNP in the last 8760 hours. HbA1C: No results for input(s): HGBA1C in the last 72 hours. CBG: No results for input(s): GLUCAP in the last 168 hours. Lipid Profile: No results for input(s): CHOL, HDL, LDLCALC, TRIG, CHOLHDL, LDLDIRECT in the last 72 hours. Thyroid Function Tests: No results for input(s): TSH, T4TOTAL, FREET4, T3FREE,  THYROIDAB in the last 72 hours. Anemia Panel: No results for input(s): VITAMINB12, FOLATE, FERRITIN, TIBC, IRON, RETICCTPCT in the last 72 hours. Urine analysis:    Component Value Date/Time   COLORURINE YELLOW (A) 10/13/2015 1250   APPEARANCEUR TURBID (A) 10/13/2015 1250   LABSPEC 1.023 10/13/2015 1250   PHURINE 5.0 10/13/2015 1250   GLUCOSEU NEGATIVE 10/13/2015 1250   HGBUR NEGATIVE 10/13/2015 1250   BILIRUBINUR NEGATIVE 10/13/2015 1250   KETONESUR NEGATIVE 10/13/2015 1250   PROTEINUR NEGATIVE 10/13/2015 1250   NITRITE NEGATIVE 10/13/2015 1250   LEUKOCYTESUR NEGATIVE 10/13/2015 1250   Sepsis Labs: @LABRCNTIP (procalcitonin:4,lacticidven:4)  ) Recent Results (from the past 240 hour(s))  Blood culture (routine x 2)     Status: None (Preliminary result)   Collection Time: 04/02/16  4:54 PM  Result Value Ref Range Status   Specimen Description BLOOD RIGHT HAND  Final   Special Requests BOTTLES DRAWN AEROBIC AND ANAEROBIC 5CC  Final   Culture NO GROWTH 2 DAYS  Final   Report Status PENDING  Incomplete  Blood culture (routine x 2)  Status: None (Preliminary result)   Collection Time: 04/02/16  5:03 PM  Result Value Ref Range Status   Specimen Description BLOOD LEFT HAND  Final   Special Requests BOTTLES DRAWN AEROBIC AND ANAEROBIC 5CC  Final   Culture NO GROWTH 2 DAYS  Final   Report Status PENDING  Incomplete  Body fluid culture     Status: None (Preliminary result)   Collection Time: 04/04/16  4:08 PM  Result Value Ref Range Status   Specimen Description NEEDLE ASPIRATE  Final   Special Requests L3 TO L4 DISC SPACE  Final   Gram Stain   Final    ABUNDANT WBC PRESENT, PREDOMINANTLY PMN NO ORGANISMS SEEN    Culture NO GROWTH < 24 HOURS  Final   Report Status PENDING  Incomplete     Invalid input(s): PROCALCITONIN, LACTICACIDVEN   Radiology Studies: No results found.      Scheduled Meds: . cefTRIAXone (ROCEPHIN)  IV  2 g Intravenous Q24H  . citalopram  20 mg  Oral BH-q7a  . docusate sodium  100 mg Oral BID  . letrozole  2.5 mg Oral Daily  . levothyroxine  100 mcg Oral QAC breakfast  . loratadine  10 mg Oral Daily  . mometasone-formoterol  2 puff Inhalation BID  . montelukast  10 mg Oral QHS  . nicotine  21 mg Transdermal Daily  . pantoprazole  40 mg Oral Daily  . pravastatin  40 mg Oral QHS  . sucralfate  1 g Oral TID WC & HS  . vancomycin  750 mg Intravenous Q12H   Continuous Infusions: . sodium chloride 75 mL/hr at 04/05/16 0500     LOS: 2 days    Time spent: 35 minutes    Naji Mehringer A, MD [facility] Pager [phone]  If 7PM-7AM, please contact night-coverage [email or web address] Password [facility] 04/05/2016, 12:37 PM

## 2016-04-05 NOTE — Progress Notes (Signed)
Patient ID: Sara David, female   DOB: Jan 08, 1946, 70 y.o.   MRN: [id number]          [facility] for Infectious Disease    Date of Admission:  04/02/2016           Day 1 vancomycin        Day 1 ceftriaxone  Principal Problem:   Acute low back pain Active Problems:   Fever   Gastroesophageal reflux disease   Hypothyroidism   Breast cancer metastasized to multiple sites [facility])   Malignant neoplasm of upper-outer quadrant of right female breast (HCC)   Benign essential HTN   . cefTRIAXone (ROCEPHIN)  IV  2 g Intravenous Q24H  . citalopram  20 mg Oral BH-q7a  . docusate sodium  100 mg Oral BID  . letrozole  2.5 mg Oral Daily  . levothyroxine  100 mcg Oral QAC breakfast  . loratadine  10 mg Oral Daily  . mometasone-formoterol  2 puff Inhalation BID  . montelukast  10 mg Oral QHS  . nicotine  21 mg Transdermal Daily  . pantoprazole  40 mg Oral Daily  . pravastatin  40 mg Oral QHS  . sucralfate  1 g Oral TID WC & HS  . vancomycin  750 mg Intravenous Q12H    SUBJECTIVE: Her severe, 10+ out of 10, low back pain is unchanged. She underwent lumbar aspiration yesterday. Specimens were obtained but there is no note from interventional radiology indicating what the findings were.  Review of Systems: Review of Systems  Constitutional: Positive for malaise/fatigue. Negative for chills, diaphoresis and fever.  Musculoskeletal: Positive for back pain.    Past Medical History:  Diagnosis Date  . Arthritis    "back" (04/03/2016)  . Breast cancer ([place]) 2012    no lumpectomy. Treating with meds  . Cancer ([place])    small intestines 2012, gallbladder 2016  . Chronic lower back pain   . COPD (chronic obstructive pulmonary disease) ([place])    "use inhalers for COPD that dx'd at one time" (04/03/2016)  . Family history of adverse reaction to anesthesia    daughter gets nauseated  . GERD (gastroesophageal reflux disease)   . High cholesterol   . Hypertension   . Hypothyroidism   .  Skin cancer    "right neck; mid forehead"    Social History  Substance Use Topics  . Smoking status: Current Every Day Smoker    Packs/day: 0.75    Years: 54.00    Types: Cigarettes  . Smokeless tobacco: Never Used  . Alcohol use No    Family History  Problem Relation Age of Onset  . Lung cancer Brother 66  . Heart disease Father   . Diabetes Father    Allergies  Allergen Reactions  . Carafate [Sucralfate] Nausea Only  . Diclofenac Sodium Nausea Only  . Erythromycin Diarrhea and Nausea And Vomiting  . Adhesive [Tape] Itching and Rash  . Wellbutrin [Bupropion] Itching and Rash    OBJECTIVE: Vitals:   04/04/16 1610 04/04/16 1928 04/04/16 2108 04/05/16 0539  BP:   (!) 108/59 (!) 116/45  Pulse:   76 77  Resp:   18 18  Temp:   98.5 F (36.9 C) 99 F (37.2 C)  TempSrc:   Oral Oral  SpO2:  92% 100% 95%  Weight: 160 lb (72.6 kg)     Height: 5\' 4"  (1.626 m)      Body mass index is 27.46 kg/m.  Physical Exam  Constitutional:  She is very uncomfortable due to her pain. She is groggy.    Lab Results Lab Results  Component Value Date   WBC 6.2 04/05/2016   HGB 9.9 (L) 04/05/2016   HCT 31.4 (L) 04/05/2016   MCV 97.2 04/05/2016   PLT 201 04/05/2016    Lab Results  Component Value Date   CREATININE 0.87 04/05/2016   BUN 10 04/05/2016   NA 136 04/05/2016   K 4.2 04/05/2016   CL 106 04/05/2016   CO2 24 04/05/2016    Lab Results  Component Value Date   ALT 54 04/03/2016   AST 52 (H) 04/03/2016   ALKPHOS 80 04/03/2016   BILITOT 0.3 04/03/2016     Microbiology: Recent Results (from the past 240 hour(s))  Blood culture (routine x 2)     Status: None (Preliminary result)   Collection Time: 04/02/16  4:54 PM  Result Value Ref Range Status   Specimen Description BLOOD RIGHT HAND  Final   Special Requests BOTTLES DRAWN AEROBIC AND ANAEROBIC 5CC  Final   Culture NO GROWTH 2 DAYS  Final   Report Status PENDING  Incomplete  Blood culture (routine x 2)      Status: None (Preliminary result)   Collection Time: 04/02/16  5:03 PM  Result Value Ref Range Status   Specimen Description BLOOD LEFT HAND  Final   Special Requests BOTTLES DRAWN AEROBIC AND ANAEROBIC 5CC  Final   Culture NO GROWTH 2 DAYS  Final   Report Status PENDING  Incomplete  Body fluid culture     Status: None (Preliminary result)   Collection Time: 04/04/16  4:08 PM  Result Value Ref Range Status   Specimen Description NEEDLE ASPIRATE  Final   Special Requests L3 TO L4 DISC SPACE  Final   Gram Stain   Final    ABUNDANT WBC PRESENT, PREDOMINANTLY PMN NO ORGANISMS SEEN    Culture PENDING  Incomplete   Report Status PENDING  Incomplete     ASSESSMENT: The Gram stain of her lumbar aspirate shows PMNs but no organisms. Culture and cytology are pending. I will continue vancomycin and ceftriaxone pending final cultures and cytology. I will go ahead and ask for PICC placement.  PLAN: 1. Continue vancomycin and ceftriaxone pending culture results 2. PICC placement  Michel Bickers, MD [facility] [place] [facility] [phone] pager   [phone] cell 04/05/2016, 9:56 AM

## 2016-04-06 LAB — BASIC METABOLIC PANEL
Anion gap: 6 (ref 5–15)
BUN: 7 mg/dL (ref 6–20)
CALCIUM: 8.3 mg/dL — AB (ref 8.9–10.3)
CO2: 24 mmol/L (ref 22–32)
CREATININE: 0.89 mg/dL (ref 0.44–1.00)
Chloride: 106 mmol/L (ref 101–111)
GFR calc Af Amer: >60 mL/min (ref >60)
GFR calc non Af Amer: >60 mL/min (ref >60)
GLUCOSE: 101 mg/dL — AB (ref 65–99)
Potassium: 3.6 mmol/L (ref 3.5–5.1)
Sodium: 136 mmol/L (ref 135–145)

## 2016-04-06 LAB — CBC
HCT: 31.2 % — ABNORMAL LOW (ref 36.0–46.0)
HEMOGLOBIN: 10 g/dL — AB (ref 12.0–15.0)
MCH: 30.6 pg (ref 26.0–34.0)
MCHC: 32.1 g/dL (ref 30.0–36.0)
MCV: 95.4 fL (ref 78.0–100.0)
PLATELETS: 216 10*3/uL (ref 150–400)
RBC: 3.27 MIL/uL — ABNORMAL LOW (ref 3.87–5.11)
RDW: 13.5 % (ref 11.5–15.5)
WBC: 5.3 10*3/uL (ref 4.0–10.5)

## 2016-04-06 MED ORDER — MAGNESIUM CITRATE PO SOLN
1.0000 | Freq: Once | ORAL | Status: AC
  Administered 2016-04-06: 1 via ORAL
  Filled 2016-04-06: qty 296

## 2016-04-06 MED ORDER — SODIUM CHLORIDE 0.9% FLUSH: 10.0000 mL | INTRAVENOUS | Status: DC | PRN

## 2016-04-06 MED ORDER — SODIUM CHLORIDE 0.9% FLUSH
10.0000 mL | Freq: Two times a day (BID) | INTRAVENOUS | Status: DC
  Administered 2016-04-08 – 2016-04-11 (×4): 10 mL

## 2016-04-06 NOTE — Progress Notes (Signed)
PROGRESS NOTE  Sara David  [id number] DOB: May 15, 1946 DOA: 04/02/2016 PCP: Marinda Elk, MD Outpatient Specialists:  Subjective: Seen with her husband at bedside, questions answered about PICC line and discitis. Feels pain is better today 6/10, on admission was 8/10.  Brief Narrative:  Sara David is a 70 y.o. female with a medical history of metastatic breast cancer, hypertension, presented to the emergency department with complaints of back pain that has been worsening since Saturday. Patient states she's had back pain intermittently for the last 3 years however Saturday she began to have very sharp pain located in her lower back radiating to her left buttock. Patient was taking her pain medication without resolution of her symptoms or any improvement in her back pain. Patient did try to see her pain management doctor however could not obtain an appointment until next Friday. Currently she denies any chest pain, shortness of breath, abdominal pain, nausea or vomiting, diarrhea, dizziness or headache. She denies recent travel, or contact with ill persons. She does admit to trying to help her mother out of the shower on Saturday. Patient does endorse feeling warm and hired as well as having constipation.   Assessment & Plan:   Principal Problem:   Acute low back pain Active Problems:   Gastroesophageal reflux disease   Hypothyroidism   Breast cancer metastasized to multiple sites [facility])   Malignant neoplasm of upper-outer quadrant of right female breast (HCC)   Benign essential HTN   Fever   Discitis of lumbar region   L3-L4 discitis, probable -MRI lumbar spine: new abnormal epidural tissue L3-4 and to the left, ?discitis vs osteomyelitis -Patient has no leukocytosis, high CRP and sedimentation rate, had fever of 102.1 last night in the ED. -Continue to hold antibiotics. -Blood cultures obtained. -IR did L3-L4 aspiration/biopsy, showed PMNs but no organisms. -Continue  current antibiotics, she is on Rocephin and vancomycin, PICC to be placed.  Metastatic breast cancer -Sees Dr. Grayland Ormond [facility]) -Continue letrozole  Essential Hypertension -Recently taken off of her blood pressure medications one week ago due to hypotension -Blood pressure currently stable, will start IV pain medications as needed  Chronic normocytic anemia -Baseline hemoglobin between 10 and 11, currently 10.8 -Continue to monitor CBC  Tobacco abuse -Discussed smoking cessation -Nicotine patch  Hypokalemia -Place with oral supplements, this is back to normal.  Mild acute kidney injury -Creatinine currently 1.09, baseline appears to be 0.7-0.8 -This is back to normal at 0.9 after IV fluid hydration.  Constipation -Continue MiraLAX  Hypothyroidism -Continue Synthroid  GERD -Continue PPI  Acute delirium -Per nursing staff they describe what appears to be acute delirium last night, she is on multiple medication can cause delirium including narcotics, benzodiazepines and Benadryl. Unlikely this to be secondary to the infection. Watch closely.  DVT prophylaxis: SCDs. Code Status: DNR Family Communication:  Disposition Plan:  Diet: Diet regular Room service appropriate? Yes; Fluid consistency: Thin  Consultants:   IR.  ID  Procedures:   None  Antimicrobials:   None  Objective: Vitals:   04/05/16 1942 04/05/16 2152 04/06/16 0512 04/06/16 0925  BP:  (!) 129/56 130/60   Pulse:  73 77   Resp:  19 18   Temp:  98.3 F (36.8 C) 98.6 F (37 C)   TempSrc:  Oral Oral   SpO2: 95% 93% 92% 95%  Weight:      Height:        Intake/Output Summary (Last 24 hours) at 04/06/16 1140 Last data filed at  04/06/16 0512  Gross per 24 hour  Intake              480 ml  Output             3450 ml  Net            -2970 ml   Filed Weights   04/02/16 1237 04/04/16 1610  Weight: 67.1 kg (148 lb) 72.6 kg (160 lb)    Examination: General exam: Appears  calm and comfortable  Respiratory system: Clear to auscultation. Respiratory effort normal. Cardiovascular system: S1 & S2 heard, RRR. No JVD, murmurs, rubs, gallops or clicks. No pedal edema. Gastrointestinal system: Abdomen is nondistended, soft and nontender. No organomegaly or masses felt. Normal bowel sounds heard. Central nervous system: Alert and oriented. No focal neurological deficits. Extremities: Symmetric 5 x 5 power. Skin: No rashes, lesions or ulcers Psychiatry: Judgement and insight appear normal. Mood & affect appropriate.   Data Reviewed: I have personally reviewed following labs and imaging studies  CBC:  Recent Labs Lab 04/02/16 1350 04/03/16 0519 04/05/16 0445 04/06/16 0342  WBC 5.0 5.2 6.2 5.3  HGB 10.8* 9.8* 9.9* 10.0*  HCT 34.8* 30.4* 31.4* 31.2*  MCV 96.7 94.7 97.2 95.4  PLT 176 168 201 [id number]   Basic Metabolic Panel:  Recent Labs Lab 04/02/16 1350 04/03/16 0519 04/04/16 0249 04/05/16 0445 04/06/16 0342  NA 137 138 137 136 136  K 3.2* 3.5 4.3 4.2 3.6  CL 103 105 109 106 106  CO2 26 25 21* 24 24  GLUCOSE 130* 140* 110* 105* 101*  BUN 14 12 14 10 7   CREATININE 1.09* 0.90 1.05* 0.87 0.89  CALCIUM 9.2 8.7* 8.4* 8.2* 8.3*   GFR: Estimated Creatinine Clearance: 57.5 mL/min (by C-G formula based on SCr of 0.89 mg/dL). Liver Function Tests:  Recent Labs Lab 04/03/16 0519  AST 52*  ALT 54  ALKPHOS 80  BILITOT 0.3  PROT 6.0*  ALBUMIN 3.0*   No results for input(s): LIPASE, AMYLASE in the last 168 hours. No results for input(s): AMMONIA in the last 168 hours. Coagulation Profile:  Recent Labs Lab 04/04/16 0249  INR 1.03   Cardiac Enzymes: No results for input(s): CKTOTAL, CKMB, CKMBINDEX, TROPONINI in the last 168 hours. BNP (last 3 results) No results for input(s): PROBNP in the last 8760 hours. HbA1C: No results for input(s): HGBA1C in the last 72 hours. CBG: No results for input(s): GLUCAP in the last 168 hours. Lipid Profile: No  results for input(s): CHOL, HDL, LDLCALC, TRIG, CHOLHDL, LDLDIRECT in the last 72 hours. Thyroid Function Tests: No results for input(s): TSH, T4TOTAL, FREET4, T3FREE, THYROIDAB in the last 72 hours. Anemia Panel: No results for input(s): VITAMINB12, FOLATE, FERRITIN, TIBC, IRON, RETICCTPCT in the last 72 hours. Urine analysis:    Component Value Date/Time   COLORURINE YELLOW (A) 10/13/2015 1250   APPEARANCEUR TURBID (A) 10/13/2015 1250   LABSPEC 1.023 10/13/2015 1250   PHURINE 5.0 10/13/2015 1250   GLUCOSEU NEGATIVE 10/13/2015 1250   HGBUR NEGATIVE 10/13/2015 1250   BILIRUBINUR NEGATIVE 10/13/2015 1250   KETONESUR NEGATIVE 10/13/2015 1250   PROTEINUR NEGATIVE 10/13/2015 1250   NITRITE NEGATIVE 10/13/2015 1250   LEUKOCYTESUR NEGATIVE 10/13/2015 1250   Sepsis Labs: @LABRCNTIP (procalcitonin:4,lacticidven:4)  ) Recent Results (from the past 240 hour(s))  Blood culture (routine x 2)     Status: None (Preliminary result)   Collection Time: 04/02/16  4:54 PM  Result Value Ref Range Status   Specimen Description BLOOD RIGHT  HAND  Final   Special Requests BOTTLES DRAWN AEROBIC AND ANAEROBIC 5CC  Final   Culture NO GROWTH 3 DAYS  Final   Report Status PENDING  Incomplete  Blood culture (routine x 2)     Status: None (Preliminary result)   Collection Time: 04/02/16  5:03 PM  Result Value Ref Range Status   Specimen Description BLOOD LEFT HAND  Final   Special Requests BOTTLES DRAWN AEROBIC AND ANAEROBIC 5CC  Final   Culture NO GROWTH 3 DAYS  Final   Report Status PENDING  Incomplete  Body fluid culture     Status: None (Preliminary result)   Collection Time: 04/04/16  4:08 PM  Result Value Ref Range Status   Specimen Description NEEDLE ASPIRATE  Final   Special Requests L3 TO L4 DISC SPACE  Final   Gram Stain   Final    ABUNDANT WBC PRESENT, PREDOMINANTLY PMN NO ORGANISMS SEEN    Culture NO GROWTH 2 DAYS  Final   Report Status PENDING  Incomplete     Invalid input(s):  PROCALCITONIN, LACTICACIDVEN   Radiology Studies: No results found.      Scheduled Meds: . cefTRIAXone (ROCEPHIN)  IV  2 g Intravenous Q24H  . citalopram  20 mg Oral BH-q7a  . letrozole  2.5 mg Oral Daily  . levothyroxine  100 mcg Oral QAC breakfast  . loratadine  10 mg Oral Daily  . mometasone-formoterol  2 puff Inhalation BID  . montelukast  10 mg Oral QHS  . nicotine  21 mg Transdermal Daily  . pantoprazole  40 mg Oral Daily  . polyethylene glycol  17 g Oral Daily  . pravastatin  40 mg Oral QHS  . sucralfate  1 g Oral TID WC & HS  . vancomycin  750 mg Intravenous Q12H   Continuous Infusions: . sodium chloride 75 mL/hr at 04/06/16 0206     LOS: 3 days    Time spent: 35 minutes    Nery Kalisz A, MD [facility] Pager [phone]  If 7PM-7AM, please contact night-coverage [email or web address] Password [email or web address] 04/06/2016, 11:40 AM

## 2016-04-06 NOTE — Progress Notes (Signed)
Peripherally Inserted Central Catheter/Midline Placement  The IV Nurse has discussed with the patient and/or persons authorized to consent for the patient, the purpose of this procedure and the potential benefits and risks involved with this procedure.  The benefits include less needle sticks, lab draws from the catheter and patient may be discharged home with the catheter.  Risks include, but not limited to, infection, bleeding, blood clot (thrombus formation), and puncture of an artery; nerve damage and irregular heat beat.  Alternatives to this procedure were also discussed.  PICC/Midline Placement Documentation  PICC Single Lumen [id number] PICC Left Basilic 42 cm 1 cm (Active)  Indication for Insertion or Continuance of Line Home intravenous therapies (PICC only) 04/06/2016  4:56 PM  Exposed Catheter (cm) 0 cm 04/06/2016  4:56 PM  Site Assessment Clean;Dry;Intact 04/06/2016  4:56 PM  Line Status Flushed;Saline locked;Blood return noted 04/06/2016  4:56 PM  Dressing Type Transparent 04/06/2016  4:56 PM  Dressing Status Clean;Dry;Intact 04/06/2016  4:56 PM  Dressing Change Due 04/13/16 04/06/2016  4:56 PM       [facility] Payment 04/06/2016, 5:01 PM

## 2016-04-06 NOTE — Progress Notes (Signed)
Pt. With some confusion on night shift.Pt. Did not try to get out of bed.  Pt. Called daughter several times and her husband to come get her.  Pt. Also called 911.  Pt. Reported that she was not in the hospital and her room looked different.  I redirected patient and she easily become orientated.  Bed alarm was placed on patient.  I spoke with patient's daughter Jeannene Patella about her mother.    Pt. Was easily redirected.  She is alert and oriented times 4 this am.  She is aware that she was confused last night.

## 2016-04-06 NOTE — Progress Notes (Signed)
Patient ID: Sara David, female   DOB: Jun 07, 1946, 70 y.o.   MRN: [id number]          [place] for Infectious Disease    Date of Admission:  04/02/2016           Day 2 vancomycin        Day 2 ceftriaxone  Principal Problem:   Acute low back pain Active Problems:   Gastroesophageal reflux disease   Hypothyroidism   Breast cancer metastasized to multiple sites [facility])   Malignant neoplasm of upper-outer quadrant of right female breast ([place])   Benign essential HTN   Fever   Discitis of lumbar region   . cefTRIAXone (ROCEPHIN)  IV  2 g Intravenous Q24H  . citalopram  20 mg Oral BH-q7a  . letrozole  2.5 mg Oral Daily  . levothyroxine  100 mcg Oral QAC breakfast  . loratadine  10 mg Oral Daily  . mometasone-formoterol  2 puff Inhalation BID  . montelukast  10 mg Oral QHS  . nicotine  21 mg Transdermal Daily  . pantoprazole  40 mg Oral Daily  . polyethylene glycol  17 g Oral Daily  . pravastatin  40 mg Oral QHS  . sucralfate  1 g Oral TID WC & HS  . vancomycin  750 mg Intravenous Q12H    SUBJECTIVE: afebrile  Review of Systems: Review of Systems  Constitutional: Positive for malaise/fatigue. Negative for chills, diaphoresis and fever.  Musculoskeletal: Positive for back pain.    OBJECTIVE: Vitals:   04/05/16 1942 04/05/16 2152 04/06/16 0512 04/06/16 0925  BP:  (!) 129/56 130/60   Pulse:  73 77   Resp:  19 18   Temp:  98.3 F (36.8 C) 98.6 F (37 C)   TempSrc:  Oral Oral   SpO2: 95% 93% 92% 95%  Weight:      Height:       Physical Exam  Constitutional: She is oriented to person, place, and time and well-developed, well-nourished, and in no distress.  HENT:  Head: Normocephalic and atraumatic.  Mouth/Throat: Oropharynx is clear and moist.  Eyes: Conjunctivae and EOM are normal. Pupils are equal, round, and reactive to light.  Neck: Normal range of motion. Neck supple.  Cardiovascular: Normal rate, regular rhythm, normal heart sounds and intact distal  pulses.  Exam reveals no gallop and no friction rub.   No murmur heard. Pulmonary/Chest: Effort normal and breath sounds normal. No respiratory distress. She has no wheezes. She has no rales. She exhibits no tenderness.  Abdominal: Soft. Bowel sounds are normal. She exhibits no distension. There is no tenderness.  Neurological: She is alert and oriented to person, place, and time. No cranial nerve deficit.  Skin: Skin is warm and dry. No rash noted. No erythema. No pallor.  Psychiatric: Mood, memory, affect and judgment normal.    Lab Results Lab Results  Component Value Date   WBC 5.3 04/06/2016   HGB 10.0 (L) 04/06/2016   HCT 31.2 (L) 04/06/2016   MCV 95.4 04/06/2016   PLT 216 04/06/2016    Lab Results  Component Value Date   CREATININE 0.89 04/06/2016   BUN 7 04/06/2016   NA 136 04/06/2016   K 3.6 04/06/2016   CL 106 04/06/2016   CO2 24 04/06/2016    Lab Results  Component Value Date   ALT 54 04/03/2016   AST 52 (H) 04/03/2016   ALKPHOS 80 04/03/2016   BILITOT 0.3 04/03/2016     Microbiology: Recent  Results (from the past 240 hour(s))  Blood culture (routine x 2)     Status: None (Preliminary result)   Collection Time: 04/02/16  4:54 PM  Result Value Ref Range Status   Specimen Description BLOOD RIGHT HAND  Final   Special Requests BOTTLES DRAWN AEROBIC AND ANAEROBIC 5CC  Final   Culture NO GROWTH 3 DAYS  Final   Report Status PENDING  Incomplete  Blood culture (routine x 2)     Status: None (Preliminary result)   Collection Time: 04/02/16  5:03 PM  Result Value Ref Range Status   Specimen Description BLOOD LEFT HAND  Final   Special Requests BOTTLES DRAWN AEROBIC AND ANAEROBIC 5CC  Final   Culture NO GROWTH 3 DAYS  Final   Report Status PENDING  Incomplete  Body fluid culture     Status: None (Preliminary result)   Collection Time: 04/04/16  4:08 PM  Result Value Ref Range Status   Specimen Description NEEDLE ASPIRATE  Final   Special Requests L3 TO L4 DISC  SPACE  Final   Gram Stain   Final    ABUNDANT WBC PRESENT, PREDOMINANTLY PMN NO ORGANISMS SEEN    Culture NO GROWTH 2 DAYS  Final   Report Status PENDING  Incomplete   Lab Results  Component Value Date   ESRSEDRATE 60 (H) 04/02/2016   Lab Results  Component Value Date   CRP 9.4 (H) 04/02/2016    ASSESSMENT: The Gram stain of her lumbar aspirate shows PMNs but no organisms. Culture are negative and cytology are pending.continue vancomycin and ceftriaxone for presumed epidural infection pending final cultures and cytology.  PLAN: 1. Continue vancomycin and ceftriaxone pending culture results 2. If cytology negative, will treat for culture negative epidural infection. 3. Pain management is still an issue - she states she is ok if she doesn't move or ambulate. Will see if abtx are making any difference in surrounding inflammation and hopefully less pain for the patient  Carlyle Basques, [place] for Infectious [place] Cell: [phone] 04/06/2016, 11:39 AM

## 2016-04-06 NOTE — Progress Notes (Signed)
[facility]  [facility] will follow [name] as her pharmacy provider for outpatient IV ABX as ordered at DC. [facility] will partner with the [facility] agency of patient's choice for nursing/HH services upon DC to home.    If patient discharges after hours, please call [phone].   Larry Sierras 04/06/2016, 8:01 AM

## 2016-04-07 LAB — CULTURE, BLOOD (ROUTINE X 2)
CULTURE: NO GROWTH
Culture: NO GROWTH

## 2016-04-07 LAB — VANCOMYCIN, TROUGH: Vancomycin Tr: 14 ug/mL — ABNORMAL LOW (ref 15–20)

## 2016-04-07 MED ORDER — LIDOCAINE 5 % EX PTCH
1.0000 | MEDICATED_PATCH | CUTANEOUS | Status: DC
  Administered 2016-04-07 – 2016-04-11 (×4): 1 via TRANSDERMAL
  Filled 2016-04-07 (×5): qty 1

## 2016-04-07 MED ORDER — VANCOMYCIN HCL IN DEXTROSE 1-5 GM/200ML-% IV SOLN
1000.0000 mg | Freq: Two times a day (BID) | INTRAVENOUS | Status: DC
  Administered 2016-04-07 – 2016-04-11 (×8): 1000 mg via INTRAVENOUS
  Filled 2016-04-07 (×10): qty 200

## 2016-04-07 NOTE — Evaluation (Signed)
Occupational Therapy Evaluation Patient Details Name: Sara David MRN: [id number] DOB: 1946-05-27 Today's Date: 04/07/2016    History of Present Illness Sara David is a 70 y.o. female with a medical history of metastatic breast cancer, hypertension, presented to the emergency department with complaints of back pain that has been worsening since Saturday. MRI lumbar spine: New abnormal epidural tissue enhancement centered at L3-4 and to the left, and extending both cephalad and caudad, possibly from tumor or epidural phlegmon/infection. There is some worsening of the endplate findings at [id number], there is prominent left foraminal stenosis and moderate central narrowing of the thecal sac at L3-4.Lumbar spondylosis and degenerative disc disease also causes moderate impingement at L2-3 and L4-5, and mild impingement at L5-S1. Biopsy has been completed and waiting on final results.  PMHx: RTHR Feb 2017 radiation on right hip due to breast cancer metastisis   Clinical Impression   This 70 yo female admitted for above presents to acute OT with deficits below (see OT problem list) thus affecting her PLOF of independent with basic and IADLs. She will benefit from acute OT with follow up OT currently at SNF.    Follow Up Recommendations  SNF;Other (comment) (if pain gets better under control then perhaps home with [facility])    Equipment Recommendations  3 in 1 bedside comode       Precautions / Restrictions Precautions Precautions: Fall Restrictions Weight Bearing Restrictions: No      Mobility Bed Mobility Overal bed mobility: Needs Assistance Bed Mobility: Rolling;Sidelying to Sit;Sit to Sidelying Rolling: Supervision (cues for squencing) Sidelying to sit: Mod assist (A for legs and trunk)     Sit to sidelying: Mod assist;+2 for physical assistance    Transfers Overall transfer level: Needs assistance Equipment used: Rolling walker (2 wheeled) Transfers: Sit to/from Stand Sit to Stand:  Min assist;+2 physical assistance              Balance Overall balance assessment: Needs assistance Sitting-balance support: Feet supported;Bilateral upper extremity supported Sitting balance-Leahy Scale: Zero Sitting balance - Comments: reliant totally on her Bil UEs on bed or one on bed with one on RW but also being supported by therapist at back to due to posterior lean Postural control: Posterior lean Standing balance support: Bilateral upper extremity supported;During functional activity Standing balance-Leahy Scale: Fair Standing balance comment: reliant on RW                            ADL Overall ADL's : Needs assistance/impaired Eating/Feeding: Independent;Bed level Eating/Feeding Details (indicate cue type and reason): unable to sit up to eat due to left hip and back pain Grooming: Set up;Supervision/safety;Bed level Grooming Details (indicate cue type and reason): unable to sit up to do these activities due to left hip and back pain Upper Body Bathing: Supervision/ safety;Set up;Bed level Upper Body Bathing Details (indicate cue type and reason): unable to sit up to do these activities due to left hip and back pain Lower Body Bathing: Moderate assistance;Bed level Lower Body Bathing Details (indicate cue type and reason): unable to sit up to do these activities due to left hip and back pain Upper Body Dressing : Moderate assistance;Bed level Upper Body Dressing Details (indicate cue type and reason): unable to sit up to do these activities due to left hip and back pain Lower Body Dressing: Maximal assistance;Bed level Lower Body Dressing Details (indicate cue type and reason): unable to sit up to do these activities  due to left hip and back pain Toilet Transfer: Minimal assistance;+2 for physical assistance;RW Toilet Transfer Details (indicate cue type and reason): 3 steps forward and 3 steps back to bed Toileting- Clothing Manipulation and Hygiene: Total  assistance Toileting - Clothing Manipulation Details (indicate cue type and reason): min A standing       General ADL Comments: Pt's pain better in standing than sitting, but when in standing she has to relie heavily on UEs               Pertinent Vitals/Pain Pain Assessment: 0-10 Pain Score:  (varied from 2 (at rest) to 10 (with sitting)) Pain Location: left hip and back Pain Descriptors / Indicators: Guarding;Grimacing;Moaning (pt was having to use both hands to support herself on EOB due to pain) Pain Intervention(s): Limited activity within patient's tolerance;Monitored during session;Repositioned;Premedicated before session     Hand Dominance Right   Extremity/Trunk Assessment Upper Extremity Assessment Upper Extremity Assessment: Overall WFL for tasks assessed   Lower Extremity Assessment Lower Extremity Assessment: Defer to PT evaluation       Communication Communication Communication: No difficulties   Cognition Arousal/Alertness: Awake/alert Behavior During Therapy: WFL for tasks assessed/performed Overall Cognitive Status: Within Functional Limits for tasks assessed                                Home Living Family/patient expects to be discharged to:: Skilled nursing facility Living Arrangements: Spouse/significant other Available Help at Discharge: Family;Available 24 hours/day Type of Home: House Home Access: Stairs to enter [employer] of Steps: 2 Entrance Stairs-Rails: None Home Layout: One level     Bathroom Shower/Tub: Tub/shower unit;Curtain Shower/tub characteristics: Architectural technologist: Handicapped height     Home Equipment: Hand held shower head;Tub bench;Cane - single point;Walker - 4 wheels;Walker - 2 wheels          Prior Functioning/Environment Level of Independence: Independent             OT Diagnosis: Generalized weakness;Acute pain   OT Problem List: Decreased strength;Decreased range of  motion;Decreased activity tolerance;Impaired balance (sitting and/or standing);Pain;Decreased knowledge of use of DME or AE   OT Treatment/Interventions: Self-care/ADL training;Balance training;DME and/or AE instruction;Therapeutic activities;Patient/family education    OT Goals(Current goals can be found in the care plan section) Acute Rehab OT Goals Patient Stated Goal: to figure out what is causing pain and get some relief OT Goal Formulation: With patient Time For Goal Achievement: 04/21/16 Potential to Achieve Goals: Good (once pain controlled)  OT Frequency: Min 2X/week           Co-evaluation PT/OT/SLP Co-Evaluation/Treatment: Yes Reason for Co-Treatment: For patient/therapist safety   OT goals addressed during session: Strengthening/ROM      End of Session Equipment Utilized During Treatment: Gait belt;Rolling walker  Activity Tolerance: Patient limited by pain Patient left: in bed;with call bell/phone within reach;with family/visitor present (MD in room)   Time: [id number] OT Time Calculation (min): 36 min Charges:  OT General Charges $OT Visit: 1 Procedure OT Evaluation $OT Eval Moderate Complexity: 1 Procedure  Almon Register [id number] 04/07/2016, 12:01 PM

## 2016-04-07 NOTE — Evaluation (Signed)
Physical Therapy Evaluation Patient Details Name: Sara David MRN: [id number] DOB: 04/06/1946 Today's Date: 04/07/2016   History of Present Illness  pt presents with Low Back Pain with MRI indicating Epidural Infection vs Mets.  pt with hx of Breast CA with mets to R Hip, small intestines, and gall bladder.  pt also with hx of HTN and R THR.    Clinical Impression  Pt frustrated by continued pain and minimal relief she has gotten.  Pt at this time requires 2 person A for OOB mobility due to pain in low back and L hip.  Feel pt will need SNF level of care at D/C.  Will continue to follow.      Follow Up Recommendations SNF    Equipment Recommendations  None recommended by PT    Recommendations for Other Services       Precautions / Restrictions Precautions Precautions: Fall;Back Precaution Booklet Issued: No Precaution Comments: Educated on posture and log roll for back comfort.  No Back precautions ordered.   Restrictions Weight Bearing Restrictions: No      Mobility  Bed Mobility Overal bed mobility: Needs Assistance Bed Mobility: Rolling;Sidelying to Sit;Sit to Sidelying Rolling: Supervision Sidelying to sit: Mod assist     Sit to sidelying: Mod assist;+2 for physical assistance General bed mobility comments: pt needs cues throughout mobility for step-by-step technique.  pt very painful with coming to/from sitting and needs 2nd person to A with returning to bed.    Transfers Overall transfer level: Needs assistance Equipment used: Rolling walker (2 wheeled) Transfers: Sit to/from Stand Sit to Stand: Min assist;+2 physical assistance         General transfer comment: cues for UE use, however pt remains leaing strongly posterior due to pain.  A and faciliation for bringing weight over BOS.  Ambulation/Gait Ambulation/Gait assistance: Min assist;+2 physical assistance Ambulation Distance (Feet): 2 Feet Assistive device: Rolling walker (2 wheeled) Gait  Pattern/deviations: Step-through pattern;Decreased stride length;Trunk flexed     General Gait Details: pt able to take 3 steps forward and back.  pt very labored with each step and requires A for maintaining balance and management for RW.  pt continues to keep trunk flexed to decrease pain.    Stairs            Wheelchair Mobility    Modified Rankin (Stroke Patients Only)       Balance Overall balance assessment: Needs assistance Sitting-balance support: Bilateral upper extremity supported;Feet supported Sitting balance-Leahy Scale: Poor Sitting balance - Comments: pt with heavy reliance on UEs to maintain balance, but is able to do so with MinG.  pt leans heavily posterior on UEs due to pain.   Postural control: Posterior lean Standing balance support: Bilateral upper extremity supported;During functional activity Standing balance-Leahy Scale: Poor Standing balance comment: reliant on RW                             Pertinent Vitals/Pain Pain Assessment: 0-10 Pain Score: 10-Worst pain ever Pain Location: L hip and low back during mobility, especially sitting. Pain Descriptors / Indicators: Aching;Grimacing;Guarding;Moaning Pain Intervention(s): Monitored during session;Premedicated before session;Repositioned    Home Living Family/patient expects to be discharged to:: Skilled nursing facility Living Arrangements: Spouse/significant other Available Help at Discharge: Family;Available 24 hours/day Type of Home: House Home Access: Stairs to enter Entrance Stairs-Rails: None Entrance Stairs-Number of Steps: 2 Home Layout: One level Home Equipment: Hand held shower head;Tub bench;Cane - single  point;Walker - 4 wheels;Walker - 2 wheels      Prior Function Level of Independence: Independent with assistive device(s) (Cane)               Hand Dominance   Dominant Hand: Right    Extremity/Trunk Assessment   Upper Extremity Assessment: Defer to OT  evaluation           Lower Extremity Assessment:  (Sensation intact, but strength and ROM limited by pain.)      Cervical / Trunk Assessment: Kyphotic (Maintains kyphosis due to pain.)  Communication   Communication: No difficulties  Cognition Arousal/Alertness: Awake/alert Behavior During Therapy: WFL for tasks assessed/performed Overall Cognitive Status: Within Functional Limits for tasks assessed                      General Comments      Exercises        Assessment/Plan    PT Assessment Patient needs continued PT services  PT Diagnosis Difficulty walking;Acute pain   PT Problem List Decreased strength;Decreased activity tolerance;Decreased balance;Decreased mobility;Decreased coordination;Decreased knowledge of use of DME;Decreased knowledge of precautions;Pain  PT Treatment Interventions DME instruction;Gait training;Stair training;Functional mobility training;Therapeutic activities;Therapeutic exercise;Balance training;Patient/family education   PT Goals (Current goals can be found in the Care Plan section) Acute Rehab PT Goals Patient Stated Goal: to figure out what is causing pain and get some relief PT Goal Formulation: With patient Time For Goal Achievement: 04/21/16 Potential to Achieve Goals: Good    Frequency Min 3X/week   Barriers to discharge        Co-evaluation PT/OT/SLP Co-Evaluation/Treatment: Yes Reason for Co-Treatment: For patient/therapist safety (pt's pain tolerance) PT goals addressed during session: Mobility/safety with mobility;Balance;Proper use of DME OT goals addressed during session: Strengthening/ROM       End of Session Equipment Utilized During Treatment: Gait belt Activity Tolerance: Patient limited by pain Patient left: in bed;with call bell/phone within reach;with family/visitor present Nurse Communication: Mobility status         Time: [id number] PT Time Calculation (min) (ACUTE ONLY): 32 min   Charges:    PT Evaluation $PT Eval Moderate Complexity: 1 Procedure     PT G CodesCatarina Hartshorn, [place] 04/07/2016, 1:49 PM

## 2016-04-07 NOTE — Progress Notes (Signed)
Pharmacy Antibiotic Note  Sara David is a 70 y.o. female admitted on 04/02/2016 with lumbar epidural infection.  Pharmacy has been consulted for Vancomycin dosing. PICC placed. Cultures neg thus far. Plan is to treat for cx neg epidural infection per ID.  VT is 14 and subtherapeutic.   Plan: Increase vancomycin to 1000 mg IV q12h Follow up progress, SCr, cultures  Height: 5\' 4"  (162.6 cm) Weight: 160 lb (72.6 kg) IBW/kg (Calculated) : 54.7  Temp (24hrs), Avg:98.3 F (36.8 C), Min:97.6 F (36.4 C), Max:98.8 F (37.1 C)   Recent Labs Lab 04/02/16 1350 04/03/16 0519 04/04/16 0249 04/05/16 0445 04/06/16 0342 04/07/16 0416  WBC 5.0 5.2  --  6.2 5.3  --   CREATININE 1.09* 0.90 1.05* 0.87 0.89  --   VANCOTROUGH  --   --   --   --   --  14*    Estimated Creatinine Clearance: 57.5 mL/min (by C-G formula based on SCr of 0.89 mg/dL).    Allergies  Allergen Reactions  . Carafate [Sucralfate] Nausea Only  . Diclofenac Sodium Nausea Only  . Erythromycin Diarrhea and Nausea And Vomiting  . Adhesive [Tape] Itching and Rash  . Wellbutrin [Bupropion] Itching and Rash    Thank you for allowing pharmacy to be a part of this patient's care.   Renold Genta, PharmD, BCPS Clinical Pharmacist Phone for today - [place] - [phone] 04/07/2016 8:31 AM

## 2016-04-07 NOTE — Progress Notes (Signed)
Patient complained of having a burning sensation from foley catheter. MD Elmahi notified. Per MD patient could be having spasms from foley. Patient given options to remove foley and try to use bed pan or keep foley in. Patient stated that she will keep foley in for now because its difficult for her to get to bed pan in time when she is in excruciating pain. Night nurse will continue to monitor.

## 2016-04-07 NOTE — Progress Notes (Signed)
Patient ID: Sara David, female   DOB: 11-05-1945, 70 y.o.   MRN: [id number]          [place] for Infectious Disease    Date of Admission:  04/02/2016           Day 3 vancomycin        Day 3 ceftriaxone  Principal Problem:   Acute low back pain Active Problems:   Gastroesophageal reflux disease   Hypothyroidism   Breast cancer metastasized to multiple sites [facility])   Malignant neoplasm of upper-outer quadrant of right female breast ([place])   Benign essential HTN   Fever   Discitis of lumbar region   . cefTRIAXone (ROCEPHIN)  IV  2 g Intravenous Q24H  . citalopram  20 mg Oral BH-q7a  . letrozole  2.5 mg Oral Daily  . levothyroxine  100 mcg Oral QAC breakfast  . loratadine  10 mg Oral Daily  . mometasone-formoterol  2 puff Inhalation BID  . montelukast  10 mg Oral QHS  . nicotine  21 mg Transdermal Daily  . pantoprazole  40 mg Oral Daily  . polyethylene glycol  17 g Oral Daily  . pravastatin  40 mg Oral QHS  . sodium chloride flush  10-40 mL Intracatheter Q12H  . sucralfate  1 g Oral TID WC & HS  . vancomycin  1,000 mg Intravenous Q12H    SUBJECTIVE: Afebrile, still significant back pain. She attempted to work with PT today where able to get up and ambulate slightly, but sitting or getting to standing appears to be significantly debilitating as observed by PT staff. Low back pain radiates down left knee at times  Review of Systems: Review of Systems  Constitutional: Negative for chills, diaphoresis, fever and malaise/fatigue.  Musculoskeletal: Positive for back pain.    OBJECTIVE: Vitals:   04/06/16 1415 04/06/16 2045 04/06/16 2106 04/07/16 0606  BP: (!) 123/57  (!) 146/67 138/69  Pulse: 83  77 73  Resp:   17 17  Temp: 97.6 F (36.4 C)  98.5 F (36.9 C) 98.8 F (37.1 C)  TempSrc: Oral  Oral Oral  SpO2: 95% 95% 95% 92%  Weight:      Height:       Physical Exam  Constitutional: She is oriented to person, place, and time and well-developed,  well-nourished, and in no distress.  HENT:  Head: Normocephalic and atraumatic.  Mouth/Throat: Oropharynx is clear and moist.  Eyes: Conjunctivae and EOM are normal. Pupils are equal, round, and reactive to light.  Neck: Normal range of motion. Neck supple.  Cardiovascular: Normal rate, regular rhythm, normal heart sounds and intact distal pulses.  Exam reveals no gallop and no friction rub.   No murmur heard. Pulmonary/Chest: Effort normal and breath sounds normal. No respiratory distress. She has no wheezes. She has no rales. She exhibits no tenderness.  Abdominal: Soft. Bowel sounds are normal. She exhibits no distension. There is no tenderness.  Neurological: She is alert and oriented to person, place, and time. No cranial nerve deficit.  Skin: Skin is warm and dry. No rash noted. No erythema. No pallor.  Psychiatric: Mood, memory, affect and judgment normal.    Lab Results Lab Results  Component Value Date   WBC 5.3 04/06/2016   HGB 10.0 (L) 04/06/2016   HCT 31.2 (L) 04/06/2016   MCV 95.4 04/06/2016   PLT 216 04/06/2016    Lab Results  Component Value Date   CREATININE 0.89 04/06/2016   BUN 7 04/06/2016  NA 136 04/06/2016   K 3.6 04/06/2016   CL 106 04/06/2016   CO2 24 04/06/2016    Lab Results  Component Value Date   ALT 54 04/03/2016   AST 52 (H) 04/03/2016   ALKPHOS 80 04/03/2016   BILITOT 0.3 04/03/2016     Microbiology: Recent Results (from the past 240 hour(s))  Blood culture (routine x 2)     Status: None (Preliminary result)   Collection Time: 04/02/16  4:54 PM  Result Value Ref Range Status   Specimen Description BLOOD RIGHT HAND  Final   Special Requests BOTTLES DRAWN AEROBIC AND ANAEROBIC 5CC  Final   Culture NO GROWTH 4 DAYS  Final   Report Status PENDING  Incomplete  Blood culture (routine x 2)     Status: None (Preliminary result)   Collection Time: 04/02/16  5:03 PM  Result Value Ref Range Status   Specimen Description BLOOD LEFT HAND  Final     Special Requests BOTTLES DRAWN AEROBIC AND ANAEROBIC 5CC  Final   Culture NO GROWTH 4 DAYS  Final   Report Status PENDING  Incomplete  Body fluid culture     Status: None (Preliminary result)   Collection Time: 04/04/16  4:08 PM  Result Value Ref Range Status   Specimen Description NEEDLE ASPIRATE  Final   Special Requests L3 TO L4 DISC SPACE  Final   Gram Stain   Final    ABUNDANT WBC PRESENT, PREDOMINANTLY PMN NO ORGANISMS SEEN    Culture NO GROWTH 2 DAYS  Final   Report Status PENDING  Incomplete   Lab Results  Component Value Date   ESRSEDRATE 60 (H) 04/02/2016   Lab Results  Component Value Date   CRP 9.4 (H) 04/02/2016    ASSESSMENT: 70yo F with history of breast cancer who presents with acute back pain. Imaging shows L3-L4 epidural tissue enhancement but also new lesion on S1, concerning for infection +/- metastatic disease. The Gram stain of her lumbar aspirate shows PMNs but no organisms. Culture are negative and cytology are pending.continue vancomycin and ceftriaxone for presumed epidural infection pending final cultures and cytology.  PLAN: 1. Continue vancomycin and ceftriaxone pending culture results 2. If cytology negative, will treat for culture negative epidural infection. 3. Pain management is still an issue - consider using lidocaine patch to SI joint area. Will see if abtx are making any difference in surrounding inflammation and hopefully less pain for the patient. Continue to work with PT  Plan discussed with Dr Hartford Poli and PT treatment team  Carlyle Basques, [place] for Infectious Disease [facility] Cell: [phone] 04/07/2016, 9:12 AM

## 2016-04-07 NOTE — Progress Notes (Signed)
Removed old foley cath due to drainage and inserted a new foley cath.  Patient opted to continue the foley cath due to severe pain while using a bedpan.

## 2016-04-07 NOTE — Progress Notes (Signed)
PROGRESS NOTE  Sara David  [id number] DOB: 10-Mar-1946 DOA: 04/02/2016 PCP: Marinda Elk, MD Outpatient Specialists:  Subjective: Still complaining about severe pain, she was laying still in bed when I entered the room, and as if she is trying to prove to me she is hurting. She pulled herself up and she start to complain about pain. PT to evaluate.  Brief Narrative:  Sara David is a 70 y.o. female with a medical history of metastatic breast cancer, hypertension, presented to the emergency department with complaints of back pain that has been worsening since Saturday. Patient states she's had back pain intermittently for the last 3 years however Saturday she began to have very sharp pain located in her lower back radiating to her left buttock. Patient was taking her pain medication without resolution of her symptoms or any improvement in her back pain. Patient did try to see her pain management doctor however could not obtain an appointment until next Friday. Currently she denies any chest pain, shortness of breath, abdominal pain, nausea or vomiting, diarrhea, dizziness or headache. She denies recent travel, or contact with ill persons. She does admit to trying to help her mother out of the shower on Saturday. Patient does endorse feeling warm and hired as well as having constipation.   Assessment & Plan:   Principal Problem:   Acute low back pain Active Problems:   Gastroesophageal reflux disease   Hypothyroidism   Breast cancer metastasized to multiple sites [facility])   Malignant neoplasm of upper-outer quadrant of right female breast ([facility])   Benign essential HTN   Fever   Discitis of lumbar region   L3-L4 discitis, probable -MRI lumbar spine: new abnormal epidural tissue L3-4 and to the left, ?discitis vs osteomyelitis -Patient has no leukocytosis, high CRP and sedimentation rate, had fever of 102.1 last night in the ED. -Continue to hold antibiotics. -Blood cultures  obtained. -IR did L3-L4 aspiration/biopsy, showed PMNs but no organisms. -Continue current antibiotics, she is on Rocephin and vancomycin, PICC to be placed. -History complaining about significant pain, PT to evaluate, might need to go to SNF if she couldn't ambulate.  Metastatic breast cancer -Sees Dr. Grayland Ormond [facility]) -Continue letrozole  Essential Hypertension -Recently taken off of her blood pressure medications one week ago due to hypotension -Blood pressure currently stable, will start IV pain medications as needed  Chronic normocytic anemia -Baseline hemoglobin between 10 and 11, currently 10.8 -Continue to monitor CBC  Tobacco abuse -Discussed smoking cessation -Nicotine patch  Hypokalemia -Place with oral supplements, this is back to normal.  Mild acute kidney injury -Creatinine currently 1.09, baseline appears to be 0.7-0.8 -This is back to normal at 0.9 after IV fluid hydration.  Constipation -Continue MiraLAX  Hypothyroidism -Continue Synthroid  GERD -Continue PPI  Acute delirium -Per nursing staff they describe what appears to be acute delirium last night, she is on multiple medication can cause delirium including narcotics, benzodiazepines and Benadryl. Unlikely this to be secondary to the infection. This is resolved.  DVT prophylaxis: SCDs. Code Status: DNR Family Communication:  Disposition Plan:  Diet: Diet regular Room service appropriate? Yes; Fluid consistency: Thin  Consultants:   IR.  ID  Procedures:   None  Antimicrobials:   None  Objective: Vitals:   04/06/16 2045 04/06/16 2106 04/07/16 0606 04/07/16 0955  BP:  (!) 146/67 138/69   Pulse:  77 73   Resp:  17 17   Temp:  98.5 F (36.9 C) 98.8 F (37.1 C)  TempSrc:  Oral Oral   SpO2: 95% 95% 92% 93%  Weight:      Height:        Intake/Output Summary (Last 24 hours) at 04/07/16 1133 Last data filed at 04/07/16 0702  Gross per 24 hour  Intake            2162.5 ml  Output             2775 ml  Net           -612.5 ml   Filed Weights   04/02/16 1237 04/04/16 1610  Weight: 67.1 kg (148 lb) 72.6 kg (160 lb)    Examination: General exam: Appears calm and comfortable  Respiratory system: Clear to auscultation. Respiratory effort normal. Cardiovascular system: S1 & S2 heard, RRR. No JVD, murmurs, rubs, gallops or clicks. No pedal edema. Gastrointestinal system: Abdomen is nondistended, soft and nontender. No organomegaly or masses felt. Normal bowel sounds heard. Central nervous system: Alert and oriented. No focal neurological deficits. Extremities: Symmetric 5 x 5 power. Skin: No rashes, lesions or ulcers Psychiatry: Judgement and insight appear normal. Mood & affect appropriate.   Data Reviewed: I have personally reviewed following labs and imaging studies  CBC:  Recent Labs Lab 04/02/16 1350 04/03/16 0519 04/05/16 0445 04/06/16 0342  WBC 5.0 5.2 6.2 5.3  HGB 10.8* 9.8* 9.9* 10.0*  HCT 34.8* 30.4* 31.4* 31.2*  MCV 96.7 94.7 97.2 95.4  PLT 176 168 201 [id number]   Basic Metabolic Panel:  Recent Labs Lab 04/02/16 1350 04/03/16 0519 04/04/16 0249 04/05/16 0445 04/06/16 0342  NA 137 138 137 136 136  K 3.2* 3.5 4.3 4.2 3.6  CL 103 105 109 106 106  CO2 26 25 21* 24 24  GLUCOSE 130* 140* 110* 105* 101*  BUN 14 12 14 10 7   CREATININE 1.09* 0.90 1.05* 0.87 0.89  CALCIUM 9.2 8.7* 8.4* 8.2* 8.3*   GFR: Estimated Creatinine Clearance: 57.5 mL/min (by C-G formula based on SCr of 0.89 mg/dL). Liver Function Tests:  Recent Labs Lab 04/03/16 0519  AST 52*  ALT 54  ALKPHOS 80  BILITOT 0.3  PROT 6.0*  ALBUMIN 3.0*   No results for input(s): LIPASE, AMYLASE in the last 168 hours. No results for input(s): AMMONIA in the last 168 hours. Coagulation Profile:  Recent Labs Lab 04/04/16 0249  INR 1.03   Cardiac Enzymes: No results for input(s): CKTOTAL, CKMB, CKMBINDEX, TROPONINI in the last 168 hours. BNP (last 3  results) No results for input(s): PROBNP in the last 8760 hours. HbA1C: No results for input(s): HGBA1C in the last 72 hours. CBG: No results for input(s): GLUCAP in the last 168 hours. Lipid Profile: No results for input(s): CHOL, HDL, LDLCALC, TRIG, CHOLHDL, LDLDIRECT in the last 72 hours. Thyroid Function Tests: No results for input(s): TSH, T4TOTAL, FREET4, T3FREE, THYROIDAB in the last 72 hours. Anemia Panel: No results for input(s): VITAMINB12, FOLATE, FERRITIN, TIBC, IRON, RETICCTPCT in the last 72 hours. Urine analysis:    Component Value Date/Time   COLORURINE YELLOW (A) 10/13/2015 1250   APPEARANCEUR TURBID (A) 10/13/2015 1250   LABSPEC 1.023 10/13/2015 1250   PHURINE 5.0 10/13/2015 1250   GLUCOSEU NEGATIVE 10/13/2015 1250   HGBUR NEGATIVE 10/13/2015 1250   BILIRUBINUR NEGATIVE 10/13/2015 1250   KETONESUR NEGATIVE 10/13/2015 1250   PROTEINUR NEGATIVE 10/13/2015 1250   NITRITE NEGATIVE 10/13/2015 1250   LEUKOCYTESUR NEGATIVE 10/13/2015 1250   Sepsis Labs: @LABRCNTIP (procalcitonin:4,lacticidven:4)  ) Recent Results (from the past 240 hour(s))  Blood culture (routine x 2)     Status: None (Preliminary result)   Collection Time: 04/02/16  4:54 PM  Result Value Ref Range Status   Specimen Description BLOOD RIGHT HAND  Final   Special Requests BOTTLES DRAWN AEROBIC AND ANAEROBIC 5CC  Final   Culture NO GROWTH 4 DAYS  Final   Report Status PENDING  Incomplete  Blood culture (routine x 2)     Status: None (Preliminary result)   Collection Time: 04/02/16  5:03 PM  Result Value Ref Range Status   Specimen Description BLOOD LEFT HAND  Final   Special Requests BOTTLES DRAWN AEROBIC AND ANAEROBIC 5CC  Final   Culture NO GROWTH 4 DAYS  Final   Report Status PENDING  Incomplete  Body fluid culture     Status: None (Preliminary result)   Collection Time: 04/04/16  4:08 PM  Result Value Ref Range Status   Specimen Description NEEDLE ASPIRATE  Final   Special Requests L3 TO  L4 DISC SPACE  Final   Gram Stain   Final    ABUNDANT WBC PRESENT, PREDOMINANTLY PMN NO ORGANISMS SEEN    Culture NO GROWTH 2 DAYS  Final   Report Status PENDING  Incomplete     Invalid input(s): PROCALCITONIN, LACTICACIDVEN   Radiology Studies: No results found.      Scheduled Meds: . cefTRIAXone (ROCEPHIN)  IV  2 g Intravenous Q24H  . citalopram  20 mg Oral BH-q7a  . letrozole  2.5 mg Oral Daily  . levothyroxine  100 mcg Oral QAC breakfast  . loratadine  10 mg Oral Daily  . mometasone-formoterol  2 puff Inhalation BID  . montelukast  10 mg Oral QHS  . nicotine  21 mg Transdermal Daily  . pantoprazole  40 mg Oral Daily  . polyethylene glycol  17 g Oral Daily  . pravastatin  40 mg Oral QHS  . sodium chloride flush  10-40 mL Intracatheter Q12H  . sucralfate  1 g Oral TID WC & HS  . vancomycin  1,000 mg Intravenous Q12H   Continuous Infusions: . sodium chloride 75 mL/hr at 04/07/16 0447     LOS: 4 days    Time spent: 35 minutes    Norberto Wishon A, MD [facility] Pager [phone]  If 7PM-7AM, please contact night-coverage [email or web address] Password [facility] 04/07/2016, 11:33 AM

## 2016-04-08 ENCOUNTER — Inpatient Hospital Stay (HOSPITAL_COMMUNITY): Payer: Medicare Other

## 2016-04-08 ENCOUNTER — Encounter (HOSPITAL_COMMUNITY): Payer: Self-pay | Admitting: Interventional Radiology

## 2016-04-08 LAB — BODY FLUID CULTURE: Culture: NO GROWTH

## 2016-04-08 MED ORDER — GADOBENATE DIMEGLUMINE 529 MG/ML IV SOLN
15.0000 mL | Freq: Once | INTRAVENOUS | Status: AC | PRN
  Administered 2016-04-08: 15 mL via INTRAVENOUS

## 2016-04-08 NOTE — NC FL2 (Signed)
[place] LEVEL OF CARE SCREENING TOOL     IDENTIFICATION  Patient Name: Sara David Birthdate: Nov 04, 1945 Sex: female Admission Date (Current Location): 04/02/2016  [facility] and [place] Number:  Herbalist and Address:  [place], [place] [address] [place], [place] [address]      Provider Number: [id number]  Attending Physician Name and Address:  Verlee Monte, MD  Relative Name and Phone Number:       Current Level of Care: Hospital Recommended Level of Care: [place] Prior Approval Number:    Date Approved/Denied:   PASRR Number:    Discharge Plan: SNF    Current Diagnoses: Patient Active Problem List   Diagnosis Date Noted  . Discitis of lumbar region   . Fever 04/04/2016  . Acute low back pain 04/02/2016  . Asthma without status asthmaticus 10/17/2015  . Malignant neoplasm of upper-outer quadrant of right female breast ([place]) 10/17/2015  . Carpal tunnel syndrome 10/17/2015  . Cervical disc disease 10/17/2015  . Cervical pain 10/17/2015  . Benign essential HTN 10/17/2015  . Acid reflux 10/17/2015  . Combined fat and carbohydrate induced hyperlipemia 10/17/2015  . Adult hypothyroidism 10/17/2015  . Adaptive colitis 10/17/2015  . Primary osteoarthritis of right hip 10/03/2015  . Small bowel obstruction ([place]) 01/30/2015  . B12 deficiency 01/30/2015  . Gastroesophageal reflux disease 01/30/2015  . IBS (irritable bowel syndrome) 01/30/2015  . Asthma 01/30/2015  . Hypothyroidism 01/30/2015  . FH: cholecystectomy 01/30/2015  . Breast cancer metastasized to multiple sites ([place]) 01/30/2015  . Bloodgood disease 05/30/2014  . Post menopausal syndrome 05/30/2014  . Raynaud's syndrome without gangrene 05/30/2014  . Chronic bronchitis ([place]) 01/26/2014    Orientation RESPIRATION BLADDER Height & Weight     Self, Time, Situation, Place  Normal Continent Weight: 160 lb (72.6 kg) Height:  5\' 4"  (162.6 cm)   BEHAVIORAL SYMPTOMS/MOOD NEUROLOGICAL BOWEL NUTRITION STATUS      Continent    AMBULATORY STATUS COMMUNICATION OF NEEDS Skin   Limited Assist   Normal                       Personal Care Assistance Level of Assistance  Bathing, Dressing Bathing Assistance: Limited assistance   Dressing Assistance: Limited assistance     Functional Limitations Info             SPECIAL CARE FACTORS FREQUENCY                       Contractures      Additional Factors Info                  Current Medications (04/08/2016):  This is the current hospital active medication list Current Facility-Administered Medications  Medication Dose Route Frequency Provider Last Rate Last Dose  . 0.9 %  sodium chloride infusion   Intravenous Continuous Maryann Mikhail, DO 75 mL/hr at 04/08/16 [id number]    . acetaminophen (TYLENOL) tablet 650 mg  650 mg Oral Q6H PRN Maryann Mikhail, DO       Or  . acetaminophen (TYLENOL) suppository 650 mg  650 mg Rectal Q6H PRN Maryann Mikhail, DO      . ALPRAZolam Duanne Moron) tablet 0.5 mg  0.5 mg Oral TID PRN Verlee Monte, MD   0.5 mg at 04/08/16 1555  . cefTRIAXone (ROCEPHIN) 2 g in dextrose 5 % 50 mL IVPB  2 g Intravenous Q24H Michel Bickers, MD  2 g at 04/08/16 1556  . citalopram (CELEXA) tablet 20 mg  20 mg Oral [facility] Mikhail, DO   20 mg at 04/08/16 [id number]  . HYDROmorphone (DILAUDID) injection 1 mg  1 mg Intravenous Q4H PRN Verlee Monte, MD   1 mg at 04/08/16 1238  . letrozole [facility]) tablet 2.5 mg  2.5 mg Oral Daily Maryann Mikhail, DO   2.5 mg at 04/08/16 0949  . levothyroxine (SYNTHROID, LEVOTHROID) tablet 100 mcg  100 mcg Oral QAC breakfast Maryann Mikhail, DO   100 mcg at 04/08/16 0949  . lidocaine (LIDODERM) 5 % 1 patch  1 patch Transdermal Q24H Verlee Monte, MD   1 patch at 04/08/16 1237  . loratadine (CLARITIN) tablet 10 mg  10 mg Oral Daily Maryann Mikhail, DO   10 mg at 04/08/16 0949  . mometasone-formoterol (DULERA) 100-5 MCG/ACT inhaler 2  puff  2 puff Inhalation BID Maryann Mikhail, DO   2 puff at 04/08/16 1027  . montelukast (SINGULAIR) tablet 10 mg  10 mg Oral QHS Maryann Mikhail, DO   10 mg at 04/07/16 2140  . nicotine (NICODERM CQ - dosed in mg/24 hours) patch 21 mg  21 mg Transdermal Daily Maryann Mikhail, DO   21 mg at 04/08/16 0949  . ondansetron (ZOFRAN) tablet 4 mg  4 mg Oral Q6H PRN Maryann Mikhail, DO       Or  . ondansetron (ZOFRAN) injection 4 mg  4 mg Intravenous Q6H PRN Maryann Mikhail, DO      . oxyCODONE-acetaminophen (PERCOCET/ROXICET) 5-325 MG per tablet 1-2 tablet  1-2 tablet Oral Q4H PRN Verlee Monte, MD   2 tablet at 04/08/16 1555  . pantoprazole (PROTONIX) EC tablet 40 mg  40 mg Oral Daily Maryann Mikhail, DO   40 mg at 04/08/16 0948  . polyethylene glycol (MIRALAX / GLYCOLAX) packet 17 g  17 g Oral Daily Verlee Monte, MD   17 g at 04/08/16 0948  . pravastatin (PRAVACHOL) tablet 40 mg  40 mg Oral QHS Maryann Mikhail, DO   40 mg at 04/07/16 2140  . sodium chloride flush (NS) 0.9 % injection 10-40 mL  10-40 mL Intracatheter Q12H Verlee Monte, MD   10 mL at 04/08/16 0950  . sodium chloride flush (NS) 0.9 % injection 10-40 mL  10-40 mL Intracatheter PRN Verlee Monte, MD      . vancomycin (VANCOCIN) IVPB 1000 mg/200 mL premix  1,000 mg Intravenous Q12H Donalynn Furlong [place], RPH   1,000 mg at 04/08/16 0501  . zolpidem (AMBIEN) tablet 5 mg  5 mg Oral QHS PRN Maryann Mikhail, DO   5 mg at 04/07/16 2254     Discharge Medications: Please see discharge summary for a list of discharge medications.  Relevant Imaging Results:  Relevant Lab Results:   Additional Information    Venetia Maxon, [place] [facility]

## 2016-04-08 NOTE — Care Management Important Message (Signed)
Important Message  Patient Details  Name: [name] MRN: [id number] Date of Birth: 05-05-1946   Medicare Important Message Given:  Yes    [name] [address] 04/08/2016, 12:15 PM

## 2016-04-08 NOTE — Progress Notes (Signed)
PROGRESS NOTE  Sara David  [id number] DOB: Oct 09, 1945 DOA: 04/02/2016 PCP: Marinda Elk, MD Outpatient Specialists:  Subjective: Pain is not improving, complaining about crippling pain in her back, repeat lumbar MRI. Patient very concerned about her left hip, wanted that to be evaluated, had MRI of the left hip.  Brief Narrative:  Sara David is a 70 y.o. female with a medical history of metastatic breast cancer, hypertension, presented to the emergency department with complaints of back pain that has been worsening since Saturday. Patient states she's had back pain intermittently for the last 3 years however Saturday she began to have very sharp pain located in her lower back radiating to her left buttock. Patient was taking her pain medication without resolution of her symptoms or any improvement in her back pain. Patient did try to see her pain management doctor however could not obtain an appointment until next Friday. Currently she denies any chest pain, shortness of breath, abdominal pain, nausea or vomiting, diarrhea, dizziness or headache. She denies recent travel, or contact with ill persons. She does admit to trying to help her mother out of the shower on Saturday. Patient does endorse feeling warm and hired as well as having constipation.   Assessment & Plan:   Principal Problem:   Acute low back pain Active Problems:   Gastroesophageal reflux disease   Hypothyroidism   Breast cancer metastasized to multiple sites [facility])   Malignant neoplasm of upper-outer quadrant of right female breast (HCC)   Benign essential HTN   Fever   Discitis of lumbar region   L3-L4 discitis, probable -MRI lumbar spine: new abnormal epidural tissue L3-4 and to the left, ?discitis vs osteomyelitis -Patient has no leukocytosis, high CRP and sedimentation rate, had fever of 102.1 last night in the ED. -Continue to hold antibiotics. -Blood cultures obtained. -IR did L3-L4 aspiration/biopsy,  showed PMNs but no organisms. -Continue current antibiotics, she is on Rocephin and vancomycin, PICC to be placed. -PT evaluated the patient and recommended skilled nursing facility, social worker to evaluate. -Very slow progress in terms of pain, added lidocaine patch, repeat MR of the back and left hip.  Metastatic breast cancer -Sees Dr. Grayland Ormond [facility]) -Continue letrozole  Essential Hypertension -Recently taken off of her blood pressure medications one week ago due to hypotension -Blood pressure currently stable, will start IV pain medications as needed  Chronic normocytic anemia -Baseline hemoglobin between 10 and 11, currently 10.8 -Continue to monitor CBC  Tobacco abuse -Discussed smoking cessation -Nicotine patch  Hypokalemia -Place with oral supplements, this is back to normal.  Mild acute kidney injury -Creatinine currently 1.09, baseline appears to be 0.7-0.8 -This is back to normal at 0.9 after IV fluid hydration.  Constipation -Continue MiraLAX  Hypothyroidism -Continue Synthroid  GERD -Continue PPI  Acute delirium -Per nursing staff they describe what appears to be acute delirium last night, she is on multiple medication can cause delirium including narcotics, benzodiazepines and Benadryl. Unlikely this to be secondary to the infection. This is resolved.  DVT prophylaxis: SCDs. Code Status: DNR Family Communication:  Disposition Plan:  Diet: Diet regular Room service appropriate? Yes; Fluid consistency: Thin  Consultants:   IR.  ID  Procedures:   None  Antimicrobials:   None  Objective: Vitals:   04/07/16 1812 04/07/16 2056 04/08/16 0520 04/08/16 1027  BP: (!) 154/61 (!) 145/66 (!) 146/58   Pulse: 72 71 64   Resp: 19 18 17    Temp: 98.4 F (36.9 C) 98.6 F (  37 C) 98.2 F (36.8 C)   TempSrc: Oral Oral    SpO2: 97% 97% 95% 96%  Weight:      Height:        Intake/Output Summary (Last 24 hours) at 04/08/16  1225 Last data filed at 04/08/16 1100  Gross per 24 hour  Intake           2937.5 ml  Output             3075 ml  Net           -137.5 ml   Filed Weights   04/02/16 1237 04/04/16 1610  Weight: 67.1 kg (148 lb) 72.6 kg (160 lb)    Examination: General exam: Appears calm and comfortable  Respiratory system: Clear to auscultation. Respiratory effort normal. Cardiovascular system: S1 & S2 heard, RRR. No JVD, murmurs, rubs, gallops or clicks. No pedal edema. Gastrointestinal system: Abdomen is nondistended, soft and nontender. No organomegaly or masses felt. Normal bowel sounds heard. Central nervous system: Alert and oriented. No focal neurological deficits. Extremities: Symmetric 5 x 5 power. Skin: No rashes, lesions or ulcers Psychiatry: Judgement and insight appear normal. Mood & affect appropriate.   Data Reviewed: I have personally reviewed following labs and imaging studies  CBC:  Recent Labs Lab 04/02/16 1350 04/03/16 0519 04/05/16 0445 04/06/16 0342  WBC 5.0 5.2 6.2 5.3  HGB 10.8* 9.8* 9.9* 10.0*  HCT 34.8* 30.4* 31.4* 31.2*  MCV 96.7 94.7 97.2 95.4  PLT 176 168 201 [id number]   Basic Metabolic Panel:  Recent Labs Lab 04/02/16 1350 04/03/16 0519 04/04/16 0249 04/05/16 0445 04/06/16 0342  NA 137 138 137 136 136  K 3.2* 3.5 4.3 4.2 3.6  CL 103 105 109 106 106  CO2 26 25 21* 24 24  GLUCOSE 130* 140* 110* 105* 101*  BUN 14 12 14 10 7   CREATININE 1.09* 0.90 1.05* 0.87 0.89  CALCIUM 9.2 8.7* 8.4* 8.2* 8.3*   GFR: Estimated Creatinine Clearance: 57.5 mL/min (by C-G formula based on SCr of 0.89 mg/dL). Liver Function Tests:  Recent Labs Lab 04/03/16 0519  AST 52*  ALT 54  ALKPHOS 80  BILITOT 0.3  PROT 6.0*  ALBUMIN 3.0*   No results for input(s): LIPASE, AMYLASE in the last 168 hours. No results for input(s): AMMONIA in the last 168 hours. Coagulation Profile:  Recent Labs Lab 04/04/16 0249  INR 1.03   Cardiac Enzymes: No results for input(s):  CKTOTAL, CKMB, CKMBINDEX, TROPONINI in the last 168 hours. BNP (last 3 results) No results for input(s): PROBNP in the last 8760 hours. HbA1C: No results for input(s): HGBA1C in the last 72 hours. CBG: No results for input(s): GLUCAP in the last 168 hours. Lipid Profile: No results for input(s): CHOL, HDL, LDLCALC, TRIG, CHOLHDL, LDLDIRECT in the last 72 hours. Thyroid Function Tests: No results for input(s): TSH, T4TOTAL, FREET4, T3FREE, THYROIDAB in the last 72 hours. Anemia Panel: No results for input(s): VITAMINB12, FOLATE, FERRITIN, TIBC, IRON, RETICCTPCT in the last 72 hours. Urine analysis:    Component Value Date/Time   COLORURINE YELLOW (A) 10/13/2015 1250   APPEARANCEUR TURBID (A) 10/13/2015 1250   LABSPEC 1.023 10/13/2015 1250   PHURINE 5.0 10/13/2015 1250   GLUCOSEU NEGATIVE 10/13/2015 1250   HGBUR NEGATIVE 10/13/2015 1250   BILIRUBINUR NEGATIVE 10/13/2015 1250   KETONESUR NEGATIVE 10/13/2015 1250   PROTEINUR NEGATIVE 10/13/2015 1250   NITRITE NEGATIVE 10/13/2015 1250   LEUKOCYTESUR NEGATIVE 10/13/2015 1250   Sepsis Labs: @LABRCNTIP (procalcitonin:4,lacticidven:4)  )  Recent Results (from the past 240 hour(s))  Blood culture (routine x 2)     Status: None   Collection Time: 04/02/16  4:54 PM  Result Value Ref Range Status   Specimen Description BLOOD RIGHT HAND  Final   Special Requests BOTTLES DRAWN AEROBIC AND ANAEROBIC 5CC  Final   Culture NO GROWTH 5 DAYS  Final   Report Status 04/07/2016 FINAL  Final  Blood culture (routine x 2)     Status: None   Collection Time: 04/02/16  5:03 PM  Result Value Ref Range Status   Specimen Description BLOOD LEFT HAND  Final   Special Requests BOTTLES DRAWN AEROBIC AND ANAEROBIC 5CC  Final   Culture NO GROWTH 5 DAYS  Final   Report Status 04/07/2016 FINAL  Final  Body fluid culture     Status: None   Collection Time: 04/04/16  4:08 PM  Result Value Ref Range Status   Specimen Description NEEDLE ASPIRATE  Final    Special Requests L3 TO L4 DISC SPACE  Final   Gram Stain   Final    ABUNDANT WBC PRESENT, PREDOMINANTLY PMN NO ORGANISMS SEEN    Culture NO GROWTH 3 DAYS  Final   Report Status 04/08/2016 FINAL  Final     Invalid input(s): PROCALCITONIN, LACTICACIDVEN   Radiology Studies: No results found.      Scheduled Meds: . cefTRIAXone (ROCEPHIN)  IV  2 g Intravenous Q24H  . citalopram  20 mg Oral BH-q7a  . letrozole  2.5 mg Oral Daily  . levothyroxine  100 mcg Oral QAC breakfast  . lidocaine  1 patch Transdermal Q24H  . loratadine  10 mg Oral Daily  . mometasone-formoterol  2 puff Inhalation BID  . montelukast  10 mg Oral QHS  . nicotine  21 mg Transdermal Daily  . pantoprazole  40 mg Oral Daily  . polyethylene glycol  17 g Oral Daily  . pravastatin  40 mg Oral QHS  . sodium chloride flush  10-40 mL Intracatheter Q12H  . sucralfate  1 g Oral TID WC & HS  . vancomycin  1,000 mg Intravenous Q12H   Continuous Infusions: . sodium chloride 75 mL/hr at 04/08/16 0644     LOS: 5 days    Time spent: 35 minutes    Nataley Bahri A, MD [facility] Pager [phone]  If 7PM-7AM, please contact night-coverage [email or web address] Password [facility] 04/08/2016, 12:25 PM

## 2016-04-08 NOTE — Progress Notes (Signed)
Physical Therapy Treatment Patient Details Name: Sara David MRN: [id number] DOB: Feb 25, 1946 Today's Date: 04/08/2016    History of Present Illness pt presents with Low Back Pain with MRI indicating Epidural Infection vs Mets.  pt with hx of Breast CA with mets to R Hip, small intestines, and gall bladder.  pt also with hx of HTN and R THR.      PT Comments    Pt performed short gait trial during session with decreased assist.  Pain remains present but patient reports lying still for extended periods makes it worse. Pt remains to require skilled rehab to improve strength and promote functional independence.  Will continue efforts.  Good progress shown today.    Follow Up Recommendations  SNF     Equipment Recommendations  None recommended by PT    Recommendations for Other Services       Precautions / Restrictions Precautions Precautions: Fall;Back Precaution Booklet Issued: No Precaution Comments: Educated on posture and log roll for back comfort.  No Back precautions ordered.   Restrictions Weight Bearing Restrictions: No    Mobility  Bed Mobility Overal bed mobility: Needs Assistance Bed Mobility: Rolling;Sidelying to Sit;Sit to Sidelying Rolling: Supervision Sidelying to sit: Supervision     Sit to sidelying: Supervision General bed mobility comments: Pt required cues but no physical assistance required.    Transfers Overall transfer level: Needs assistance Equipment used: Rolling walker (2 wheeled) Transfers: Sit to/from Stand Sit to Stand: Min guard         General transfer comment: Cues for hand placement to and from seated surface, Cues for erect stance and forward gaze in standing.    Ambulation/Gait Ambulation/Gait assistance: Min guard Ambulation Distance (Feet): 28 Feet Assistive device: Rolling walker (2 wheeled) Gait Pattern/deviations: Step-to pattern;Step-through pattern;Antalgic;Trunk flexed;Shuffle;Decreased stride length   Gait velocity  interpretation: Below normal speed for age/gender General Gait Details: Pt required cues for postural awareness, forward gaze and RW safety.  pt flexed in RW with short stride lengths.     Stairs            Wheelchair Mobility    Modified Rankin (Stroke Patients Only)       Balance Overall balance assessment: Needs assistance   Sitting balance-Leahy Scale: Good       Standing balance-Leahy Scale: Fair                      Cognition Arousal/Alertness: Awake/alert Behavior During Therapy: WFL for tasks assessed/performed Overall Cognitive Status: Within Functional Limits for tasks assessed                      Exercises      General Comments        Pertinent Vitals/Pain Pain Assessment: 0-10 Pain Score: 8  Pain Location: L hip and low back greater after mobility and worse when sitting.   Pain Descriptors / Indicators: Aching;Guarding;Grimacing;Moaning Pain Intervention(s): Monitored during session;Repositioned;Patient requesting pain meds-RN notified    Home Living                      Prior Function            PT Goals (current goals can now be found in the care plan section) Acute Rehab PT Goals Patient Stated Goal: to figure out what is causing pain and get some relief Potential to Achieve Goals: Good Progress towards PT goals: Progressing toward goals    Frequency  Min  3X/week    PT Plan Current plan remains appropriate    Co-evaluation             End of Session   Activity Tolerance: Patient limited by pain Patient left: in bed;with call bell/phone within reach;with family/visitor present     Time: [id number] PT Time Calculation (min) (ACUTE ONLY): 18 min  Charges:  $Gait Training: 8-22 mins                    G Codes:      Cristela Blue 2016-04-10, 12:23 PM  Governor Rooks, PTA pager [phone]

## 2016-04-08 NOTE — Progress Notes (Signed)
Patient ID: Sara David, female   DOB: 11-19-1945, 70 y.o.   MRN: [id number]          [facility] for Infectious Disease    Date of Admission:  04/02/2016   Total days of antibiotics 5         Ms. Bohan is currently in radiology for her repeat lumbar MRI. Lumbar aspirate Gram stain and cultures are negative and her cytology revealed only debris with acute inflammation. Given the very rapid onset of severe back pain associated with fever I feel like we should continue empiric antibiotic therapy for possible lumbar infection. She will need PICC placement.         Michel Bickers, MD [facility] for Infectious [facility] [phone] pager   [phone] cell 08/22/2015, 1:32 PM

## 2016-04-09 DIAGNOSIS — E038 Other specified hypothyroidism: Secondary | ICD-10-CM

## 2016-04-09 DIAGNOSIS — C50919 Malignant neoplasm of unspecified site of unspecified female breast: Secondary | ICD-10-CM

## 2016-04-09 LAB — CBC
HEMATOCRIT: 29.8 % — AB (ref 36.0–46.0)
Hemoglobin: 9.6 g/dL — ABNORMAL LOW (ref 12.0–15.0)
MCH: 30.5 pg (ref 26.0–34.0)
MCHC: 32.2 g/dL (ref 30.0–36.0)
MCV: 94.6 fL (ref 78.0–100.0)
PLATELETS: 228 10*3/uL (ref 150–400)
RBC: 3.15 MIL/uL — ABNORMAL LOW (ref 3.87–5.11)
RDW: 13.4 % (ref 11.5–15.5)
WBC: 3.3 10*3/uL — ABNORMAL LOW (ref 4.0–10.5)

## 2016-04-09 LAB — BASIC METABOLIC PANEL
Anion gap: 7 (ref 5–15)
BUN: 11 mg/dL (ref 6–20)
CALCIUM: 8.6 mg/dL — AB (ref 8.9–10.3)
CHLORIDE: 105 mmol/L (ref 101–111)
CO2: 27 mmol/L (ref 22–32)
CREATININE: 0.88 mg/dL (ref 0.44–1.00)
GFR calc Af Amer: >60 mL/min (ref >60)
GLUCOSE: 98 mg/dL (ref 65–99)
POTASSIUM: 3.7 mmol/L (ref 3.5–5.1)
Sodium: 139 mmol/L (ref 135–145)

## 2016-04-09 NOTE — Progress Notes (Signed)
Occupational Therapy Treatment Patient Details Name: Sara David MRN: [id number] DOB: June 11, 1946 Today's Date: 04/09/2016    History of present illness pt presents with Low Back Pain with MRI indicating Epidural Infection vs Mets.  pt with hx of Breast CA with mets to R Hip, small intestines, and gall bladder.  pt also with hx of HTN and R THR.     OT comments  Pt making slow progress with functional goals. Pt continues to report low back pain and relying heavily on her UEs while using RW  Follow Up Recommendations  SNF    Equipment Recommendations  3 in 1 bedside comode    Recommendations for Other Services      Precautions / Restrictions Precautions Precautions: Fall;Back Precaution Comments: reviewed techniques for posture and log roll for back comfort.  No Back precautions ordered.   Restrictions Weight Bearing Restrictions: No       Mobility Bed Mobility Overal bed mobility: Needs Assistance Bed Mobility: Rolling;Sidelying to Sit;Sit to Sidelying Rolling: Supervision Sidelying to sit: Supervision     Sit to sidelying: Supervision General bed mobility comments: Pt required cues but no physical assistance required.  Pt declined siting up in recliner at end of session  Transfers Overall transfer level: Independent Equipment used: Rolling walker (2 wheeled) Transfers: Sit to/from Stand Sit to Stand: Min guard         General transfer comment: Cues for hand placement to and from seated surface, Cues for erect stance and forward gaze in standing.      Balance     Sitting balance-Leahy Scale: Good       Standing balance-Leahy Scale: Fair                     ADL       Grooming: Min guard;Wash/dry hands;Wash/dry face;Standing       Lower Body Bathing: Moderate assistance;Minimal assistance;Sitting/lateral leans;Sit to/from stand           Toilet Transfer: +2 for physical assistance;RW;Min guard;Ambulation;Comfort height toilet;Grab  bars;Cueing for safety   Toileting- Clothing Manipulation and Hygiene: Moderate assistance       Functional mobility during ADLs: Min guard;Cueing for safety General ADL Comments: Pt's pain better in standing than sitting, but when in standing she has to relie heavily on UEs      Vision                     Perception     Praxis      Cognition   Behavior During Therapy: [facility] for tasks assessed/performed Overall Cognitive Status: Within Functional Limits for tasks assessed                       Extremity/Trunk Assessment               Exercises     Shoulder Instructions       General Comments      Pertinent Vitals/ Pain       Pain Score: 7  Pain Location: low back Pain Descriptors / Indicators: Tightness;Pressure Pain Intervention(s): Limited activity within patient's tolerance;Monitored during session;Repositioned  Home Living  home with her husband                                        Prior Functioning/Environment   independent  Frequency Min 2X/week     Progress Toward Goals  OT Goals(current goals can now be found in the care plan section)  Progress towards OT goals: Progressing toward goals     Plan Discharge plan remains appropriate                     End of Session Equipment Utilized During Treatment: Gait belt;Rolling walker;Other (comment) (3 in 1)   Activity Tolerance Patient limited by pain   Patient Left in bed;with call bell/phone within reach;with family/visitor present             Time: [id number] OT Time Calculation (min): 35 min  Charges: OT General Charges $OT Visit: 1 Procedure OT Treatments $Self Care/Home Management : 8-22 mins $Therapeutic Activity: 8-22 mins  Britt Bottom 04/09/2016, 12:16 PM

## 2016-04-09 NOTE — Progress Notes (Signed)
Physical Therapy Treatment Patient Details Name: Sara David MRN: [id number] DOB: 1945-12-27 Today's Date: 04/09/2016    History of Present Illness pt presents with Low Back Pain with MRI indicating Epidural Infection vs Mets.  pt with hx of Breast CA with mets to R Hip, small intestines, and gall bladder.  pt also with hx of HTN and R THR.      PT Comments    Pt performed increased mobility and educated on spinal precautions to avoid aggravated pain.  Pt advanced gait training and remains to be most painful in seated position.  Pt educated on avoiding seated position and to walk later with staff.  Will try stairs next session.    Follow Up Recommendations  SNF     Equipment Recommendations  None recommended by PT    Recommendations for Other Services       Precautions / Restrictions Precautions Precautions: Fall;Back Precaution Booklet Issued: No Precaution Comments: reviewed techniques for posture and log roll for back comfort.  No Back precautions ordered.   Restrictions Weight Bearing Restrictions: No    Mobility  Bed Mobility Overal bed mobility: Needs Assistance Bed Mobility: Rolling;Sidelying to Sit;Sit to Sidelying Rolling: Supervision Sidelying to sit: Supervision     Sit to sidelying: Supervision General bed mobility comments: Pt required cues but no physical assistance required. Cues for log rolling to improve pain during transition.  Transfers Overall transfer level: Needs assistance Equipment used: Rolling walker (2 wheeled) Transfers: Sit to/from Stand Sit to Stand: Min guard         General transfer comment: Cues for hand placement to and from seated surface and cues to control descent.  Pt remains unable to stand erect due to posturing from Low back pain.    Ambulation/Gait Ambulation/Gait assistance: Min guard;Supervision Ambulation Distance (Feet): 85 Feet Assistive device: Rolling walker (2 wheeled) Gait Pattern/deviations: Step-through  pattern;Antalgic;Trunk flexed;Decreased stride length   Gait velocity interpretation: Below normal speed for age/gender General Gait Details: Pt required cues for postural awareness, forward gaze and RW safety.  pt flexed in RW with short stride lengths.  no shuffle noted with better foot clearance.     Stairs            Wheelchair Mobility    Modified Rankin (Stroke Patients Only)       Balance Overall balance assessment: Needs assistance   Sitting balance-Leahy Scale: Good   Postural control: Posterior lean   Standing balance-Leahy Scale: Fair                      Cognition Arousal/Alertness: Awake/alert Behavior During Therapy: WFL for tasks assessed/performed Overall Cognitive Status: Within Functional Limits for tasks assessed                      Exercises      General Comments        Pertinent Vitals/Pain Pain Assessment: 0-10 Pain Score: 7  Pain Location: low back  Pain Descriptors / Indicators: Tightness;Pressure Pain Intervention(s): Monitored during session;Repositioned    Home Living                      Prior Function            PT Goals (current goals can now be found in the care plan section) Acute Rehab PT Goals Patient Stated Goal: to figure out what is causing pain and get some relief Potential to Achieve Goals: Good Progress towards  PT goals: Progressing toward goals    Frequency  Min 3X/week    PT Plan Current plan remains appropriate    Co-evaluation             End of Session Equipment Utilized During Treatment: Gait belt Activity Tolerance: Patient limited by pain Patient left: in bed;with call bell/phone within reach;with family/visitor present     Time: [id number] PT Time Calculation (min) (ACUTE ONLY): 19 min  Charges:  $Gait Training: 8-22 mins                    G Codes:      Cristela Blue 29-Apr-2016, 12:54 PM  Governor Rooks, PTA pager [phone]

## 2016-04-09 NOTE — Progress Notes (Signed)
Patient ID: Sara David, female   DOB: Aug 21, 1945, 70 y.o.   MRN: [id number]          [facility]    Date of Admission:  04/02/2016           Day 6 vancomycin         Day 6 ceftriaxone  Principal Problem:   Acute low back pain Active Problems:   Fever   Gastroesophageal reflux disease   Hypothyroidism   Breast cancer metastasized to multiple sites [facility])   Malignant neoplasm of upper-outer quadrant of right female breast ([place])   Benign essential HTN   Discitis of lumbar region   . cefTRIAXone (ROCEPHIN)  IV  2 g Intravenous Q24H  . citalopram  20 mg Oral BH-q7a  . letrozole  2.5 mg Oral Daily  . levothyroxine  100 mcg Oral QAC breakfast  . lidocaine  1 patch Transdermal Q24H  . loratadine  10 mg Oral Daily  . mometasone-formoterol  2 puff Inhalation BID  . montelukast  10 mg Oral QHS  . nicotine  21 mg Transdermal Daily  . pantoprazole  40 mg Oral Daily  . polyethylene glycol  17 g Oral Daily  . pravastatin  40 mg Oral QHS  . sodium chloride flush  10-40 mL Intracatheter Q12H  . vancomycin  1,000 mg Intravenous Q12H    SUBJECTIVE: Her back pain remains severe but is a little bit better. She is not having as much of the sharp, shooting pain in her lower back and down the back of her left leg. She has been able to do some walking with physical therapy.  Review of Systems: Review of Systems  Constitutional: Positive for malaise/fatigue. Negative for chills, diaphoresis and fever.  Gastrointestinal: Negative for abdominal pain, diarrhea, nausea and vomiting.  Musculoskeletal: Positive for back pain.    Past Medical History:  Diagnosis Date  . Arthritis    "back" (04/03/2016)  . Breast cancer ([place]) 2012    no lumpectomy. Treating with meds  . Cancer ([place])    small intestines 2012, gallbladder 2016  . Chronic lower back pain   . COPD (chronic obstructive pulmonary disease) ([place])    "use inhalers for COPD that dx'd at one time" (04/03/2016)    . Family history of adverse reaction to anesthesia    daughter gets nauseated  . GERD (gastroesophageal reflux disease)   . High cholesterol   . Hypertension   . Hypothyroidism   . Skin cancer    "right neck; mid forehead"    Social History  Substance Use Topics  . Smoking status: Current Every Day Smoker    Packs/day: 0.75    Years: 54.00    Types: Cigarettes  . Smokeless tobacco: Never Used  . Alcohol use No    Family History  Problem Relation Age of Onset  . Lung cancer Brother 92  . Heart disease Father   . Diabetes Father    Allergies  Allergen Reactions  . Carafate [Sucralfate] Nausea Only  . Diclofenac Sodium Nausea Only  . Erythromycin Diarrhea and Nausea And Vomiting  . Adhesive [Tape] Itching and Rash  . Wellbutrin [Bupropion] Itching and Rash    OBJECTIVE: Vitals:   04/08/16 0520 04/08/16 1027 04/09/16 0430 04/09/16 1433  BP: (!) 146/58  (!) 141/65 134/61  Pulse: 64  67 73  Resp: 17  18 17   Temp: 98.2 F (36.8 C)  98.8 F (37.1 C) 97.8 F (36.6 C)  TempSrc:  Oral Oral  SpO2: 95% 96% 98% 97%  Weight:      Height:       Body mass index is 27.46 kg/m.  Physical Exam  Constitutional:  She appears to be a little bit more comfortable today. She is groggy from pain medication and responds to questions slowly.  Cardiovascular: Normal rate and regular rhythm.   No murmur heard. Pulmonary/Chest: Effort normal and breath sounds normal. She has no rales.  Abdominal: Soft. There is no tenderness.  Musculoskeletal:  She is able to move around in bed more comfortably now.  Skin: No rash noted.   Left arm PICC.    Lab Results Lab Results  Component Value Date   WBC 3.3 (L) 04/09/2016   HGB 9.6 (L) 04/09/2016   HCT 29.8 (L) 04/09/2016   MCV 94.6 04/09/2016   PLT 228 04/09/2016    Lab Results  Component Value Date   CREATININE 0.88 04/09/2016   BUN 11 04/09/2016   NA 139 04/09/2016   K 3.7 04/09/2016   CL 105 04/09/2016   CO2 27  04/09/2016    Lab Results  Component Value Date   ALT 54 04/03/2016   AST 52 (H) 04/03/2016   ALKPHOS 80 04/03/2016   BILITOT 0.3 04/03/2016     Microbiology: Recent Results (from the past 240 hour(s))  Blood culture (routine x 2)     Status: None   Collection Time: 04/02/16  4:54 PM  Result Value Ref Range Status   Specimen Description BLOOD RIGHT HAND  Final   Special Requests BOTTLES DRAWN AEROBIC AND ANAEROBIC 5CC  Final   Culture NO GROWTH 5 DAYS  Final   Report Status 04/07/2016 FINAL  Final  Blood culture (routine x 2)     Status: None   Collection Time: 04/02/16  5:03 PM  Result Value Ref Range Status   Specimen Description BLOOD LEFT HAND  Final   Special Requests BOTTLES DRAWN AEROBIC AND ANAEROBIC 5CC  Final   Culture NO GROWTH 5 DAYS  Final   Report Status 04/07/2016 FINAL  Final  Body fluid culture     Status: None   Collection Time: 04/04/16  4:08 PM  Result Value Ref Range Status   Specimen Description NEEDLE ASPIRATE  Final   Special Requests L3 TO L4 DISC SPACE  Final   Gram Stain   Final    ABUNDANT WBC PRESENT, PREDOMINANTLY PMN NO ORGANISMS SEEN    Culture NO GROWTH 3 DAYS  Final   Report Status 04/08/2016 FINAL  Final     ASSESSMENT: I plan on continuing empiric antibiotics for possible lumbar infection. She is now afebrile and her pain is improving. However, I agree with repeat attempt at lumbar biopsy to look for any evidence of metastatic tumor.  PLAN: 1. Continue current antibiotics  Michel Bickers, MD [facility] for [place] [phone] pager   [phone] cell 04/09/2016, 2:48 PM

## 2016-04-09 NOTE — Progress Notes (Addendum)
PROGRESS NOTE  Sara David ERX:[id number] DOB: 1946-03-25 DOA: 04/02/2016 PCP: Marinda Elk, MD  Brief History:  70 y.o.femalewith a medical history ofmetastatic breast cancer to the thoracolumbar spine, gallbladder, and small bowel, hypertension, presented to the emergency department with complaints of back pain that has been worsening for 3 days. Patient states she's had back pain intermittently for the last 3 years; 04/02/2016 she began to have very sharp pain located in her lower back radiating to her left buttock. Patient was taking her pain medication without resolution of her symptoms or any improvement in her back pain. Patient did try to see her pain management doctor however could not obtain an appointment until 04/12/2016.  Initial MRI of the lumbar spine on 04/02/2016 revealed a new abnormal epidural tissue enhancement centered around L3-4 with enhancement extending to the left neuroforamina. She underwent discussed the operation on 04/04/2016. Cytology and cultures from that his aspiration were negative. ID was consulted, and the patient was started on ceftriaxone and vancomycin. The patient continued to have slow improvement. Repeat MRI was obtained on 04/08/2016.  Assessment/Plan: L3-L4 discitis, probable -MRI lumbar spine: new abnormal epidural tissue L3-4 and to the left, ?discitis vs osteomyelitis -had fever of 102.1 last night in the ED. -ESR 60, CRP 9.4 -Blood cultures--neg -04/04/16--IR did L3-L4 aspiration/biopsy, showed PMNs but no organisms--culture neg -Continue Rocephin and vancomycin, PICC placed -PT evaluated--> -due to slow progress, repeat MRI L-spine obtained -04/08/16 MRI L-spine-continued mild progression of mass like ventral epidural enhancement -04/09/16--case discussed with Dr. Jaclyn Shaggy to pursue repeat biopsy at L3-4 area -04/09/16--reconsulted Dr. Estanislado Pandy for possible repeat biopsy--awaiting call back -pt has had radiation to  thoracolumbar spine Jan 2017 for metastatic cancer -repeat ESR, CRP  Metastatic breast cancer -Follows Dr. Grayland [facility]) -Continue letrozole  Essential Hypertension -Recently taken off of her blood pressure medications one week ago due to hypotension -Blood pressure currently stable, will start IV pain medications as needed  Chronic normocytic anemia -Baseline hemoglobin between 10 and 11 -Continue to monitor CBC  Tobacco abuse -Discussed smoking cessation -Nicotine patch  Hypokalemia -Place with oral supplements, this is back to normal.  Constipation -Continue MiraLAX  Hypothyroidism -Continue Synthroid  GERD -ContinuePPI  Acute delirium -Per nursing staff they describe what appears to be acute delirium evening 8/20, she is on multiple medication can cause delirium including narcotics, benzodiazepines and Benadryl. Unlikely this to be secondary to the infection.  -back to baseline   Disposition Plan: not ready for d/c--needs SNF Family Communication: No  Family at bedside--Total time spent 35 minutes.  Greater than 50% spent face to face counseling and coordinating care.   Consultants:  ID, IR  Code Status:   DNR  DVT Prophylaxis:  SCDs  Procedures: As Listed in Progress Note Above  Antibiotics: None    Subjective: Patient continues to complain of pain and the lumbar area but states that overall has improved. This has limited her mobility. She denies fevers, chills, chest pain, shortness breath, nausea, vomiting, diarrhea. No abdominal pain. No dysuria or hematuria. No rashes.  Objective: Vitals:   04/07/16 2056 04/08/16 0520 04/08/16 1027 04/09/16 0430  BP: (!) 145/66 (!) 146/58  (!) 141/65  Pulse: 71 64  67  Resp: _0 Temp: 98.6 F (37 C) 98.2 F (36.8 C)  98.8 F (37.1 C)  TempSrc: Oral   Oral  SpO2: 97% 95% 96% 98%  Weight:  Height:        Intake/Output Summary (Last 24 hours) at 04/09/16 4403 Last data  filed at 04/09/16 0912  Gross per 24 hour  Intake           2982.5 ml  Output             4300 ml  Net          -1317.5 ml   Weight change:  Exam:   General:  Pt is alert, follows commands appropriately, not in acute distress  HEENT: No icterus, No thrush, No neck mass, [place]/AT  Cardiovascular: RRR, S1/S2, no rubs, no gallops  Respiratory: CTA bilaterally, no wheezing, no crackles, no rhonchi  Abdomen: Soft/+BS, non tender, non distended, no guarding  Extremities: No edema, No lymphangitis, No petechiae, No rashes, no synovitis  Neuro:  CN II-XII intact, strength -/5 in RUE, RLE, strength 4-/5 LUE, LLE; sensation intact bilateral; no dysmetria; babinski equivocal     Data Reviewed: I have personally reviewed following labs and imaging studies Basic Metabolic Panel:  Recent Labs Lab 04/03/16 0519 04/04/16 0249 04/05/16 0445 04/06/16 0342 04/09/16 0420  NA 138 137 136 136 139  K 3.5 4.3 4.2 3.6 3.7  CL 105 109 106 106 105  CO2 25 21* _0 GLUCOSE 140* 110* 105* 101* 98  BUN _1 CREATININE 0.90 1.05* 0.87 0.89 0.88  CALCIUM 8.7* 8.4* 8.2* 8.3* 8.6*   Liver Function Tests:  Recent Labs Lab 04/03/16 0519  AST 52*  ALT 54  ALKPHOS 80  BILITOT 0.3  PROT 6.0*  ALBUMIN 3.0*   No results for input(s): LIPASE, AMYLASE in the last 168 hours. No results for input(s): AMMONIA in the last 168 hours. Coagulation Profile:  Recent Labs Lab 04/04/16 0249  INR 1.03   CBC:  Recent Labs Lab 04/02/16 1350 04/03/16 0519 04/05/16 0445 04/06/16 0342 04/09/16 0420  WBC 5.0 5.2 6.2 5.3 3.3*  HGB 10.8* 9.8* 9.9* 10.0* 9.6*  HCT 34.8* 30.4* 31.4* 31.2* 29.8*  MCV 96.7 94.7 97.2 95.4 94.6  PLT 176 168 201 216 228   Cardiac Enzymes: No results for input(s): CKTOTAL, CKMB, CKMBINDEX, TROPONINI in the last 168 hours. BNP: Invalid input(s): POCBNP CBG: No results for input(s): GLUCAP in the last 168 hours. HbA1C: No results for input(s): HGBA1C in  the last 72 hours. Urine analysis:    Component Value Date/Time   COLORURINE YELLOW (A) 10/13/2015 1250   APPEARANCEUR TURBID (A) 10/13/2015 1250   LABSPEC 1.023 10/13/2015 1250   PHURINE 5.0 10/13/2015 1250   GLUCOSEU NEGATIVE 10/13/2015 1250   HGBUR NEGATIVE 10/13/2015 1250   BILIRUBINUR NEGATIVE 10/13/2015 1250   KETONESUR NEGATIVE 10/13/2015 1250   PROTEINUR NEGATIVE 10/13/2015 1250   NITRITE NEGATIVE 10/13/2015 1250   LEUKOCYTESUR NEGATIVE 10/13/2015 1250   Sepsis Labs: _2 (procalcitonin:4,lacticidven:4) ) Recent Results (from the past 240 hour(s))  Blood culture (routine x 2)     Status: None   Collection Time: 04/02/16  4:54 PM  Result Value Ref Range Status   Specimen Description BLOOD RIGHT HAND  Final   Special Requests BOTTLES DRAWN AEROBIC AND ANAEROBIC 5CC  Final   Culture NO GROWTH 5 DAYS  Final   Report Status 04/07/2016 FINAL  Final  Blood culture (routine x 2)     Status: None   Collection Time: 04/02/16  5:03 PM  Result Value Ref Range Status   Specimen Description BLOOD LEFT HAND  Final   Special  Requests BOTTLES DRAWN AEROBIC AND ANAEROBIC 5CC  Final   Culture NO GROWTH 5 DAYS  Final   Report Status 04/07/2016 FINAL  Final  Body fluid culture     Status: None   Collection Time: 04/04/16  4:08 PM  Result Value Ref Range Status   Specimen Description NEEDLE ASPIRATE  Final   Special Requests L3 TO L4 DISC SPACE  Final   Gram Stain   Final    ABUNDANT WBC PRESENT, PREDOMINANTLY PMN NO ORGANISMS SEEN    Culture NO GROWTH 3 DAYS  Final   Report Status 04/08/2016 FINAL  Final     Scheduled Meds: . cefTRIAXone (ROCEPHIN)  IV  2 g Intravenous Q24H  . citalopram  20 mg Oral BH-q7a  . letrozole  2.5 mg Oral Daily  . levothyroxine  100 mcg Oral QAC breakfast  . lidocaine  1 patch Transdermal Q24H  . loratadine  10 mg Oral Daily  . mometasone-formoterol  2 puff Inhalation BID  . montelukast  10 mg Oral QHS  . nicotine  21 mg Transdermal Daily  .  pantoprazole  40 mg Oral Daily  . polyethylene glycol  17 g Oral Daily  . pravastatin  40 mg Oral QHS  . sodium chloride flush  10-40 mL Intracatheter Q12H  . vancomycin  1,000 mg Intravenous Q12H   Continuous Infusions: . sodium chloride 75 mL/hr at 04/08/16 1835    Procedures/Studies: Mr Lumbar Spine W Wo Contrast  Result Date: 04/08/2016 CLINICAL DATA:  70 year old female with back and left hip pain, not improving. Metastatic breast cancer. Abnormal enhancement in the lumbar spine, progressed at L3-L4 on a comparison 6 days ago. Status post fluoroscopic guided L3-L4 disc aspiration 4 days ago, lab analysis revealed abundant white cells present but no organisms or growth on culture. Subsequent encounter. EXAM: MRI LUMBAR SPINE WITHOUT AND WITH CONTRAST TECHNIQUE: Multiplanar and multiecho pulse sequences of the lumbar spine were obtained without and with intravenous contrast. CONTRAST:  15 mL MultiHance COMPARISON:  Lumbar MRI 04/02/2016 and earlier FINDINGS: Segmentation:  Normal, same numbering system as on prior studies. Alignment:  Stable vertebral height and alignment. Vertebrae: Unchanged confluent endplate marrow edema and enhancement about the L3-L4 disc. Interval new STIR hyperintensity within that disc space (series 3, image 7), but continued absent enhancement. Stable bone marrow signal elsewhere. Conus medullaris: Extends to the T12-L1 level and appears normal no abnormal intradural enhancement. No thickening of the cauda equina nerve roots. Paraspinal and other soft tissues: New small volume of free fluid adjacent to the inferior right hepatic lobe. Small volume of pelvic free fluid identified on sagittal images, new. Otherwise stable abdominal viscera. A ectatic infrarenal abdominal aorta again noted, up to 25 mm diameter. Disc levels: Stable disc spaces L2-L3 and above. L3-L4: Persistent and perhaps mildly increased broad-based ventral epidural enhancement with mass effect on the thecal  sac (series 11, image 26). This extends out into the left L3 neural foramen as before. Subsequent multifactorial severe left L3 foraminal stenosis. The L4-L5 and lower disc spaces are stable. IMPRESSION: 1. Continued and perhaps mild progression of masslike ventral epidural enhancement at L3-L4 which extends into the left L3 neural foramen. Query left L3 radiculitis. Disc aspiration/biopsy microbiology results were negative aside from WBCs. Recommend correlation with pathology results from that needle biopsy which are pending. 2. Interval increased STIR signal in the L3-L4 disc space which may be postprocedural in nature. Stable endplate edema and enhancement without endplate destruction. 3. Other visualized spinal levels  are stable since 04/02/2016. 4. New free fluid in the pelvis, and to a lesser extent in the right abdomen nonspecific. 5. Ectatic abdominal aorta at risk for aneurysm development. Recommend followup by ultrasound in 5 years. This recommendation follows ACR consensus guidelines: White Paper of the ACR Incidental Findings Committee II on Vascular Findings. J Am Coll Radiol 2013; 10:789-794. Electronically Signed   By: Genevie Ann M.D.   On: 04/08/2016 16:00   Mr Lumbar Spine W Wo Contrast  Addendum Date: 04/03/2016   ADDENDUM REPORT: 04/03/2016 10:48 ADDENDUM: The original report was by Dr. Van Clines. The following addendum is by Dr. Van Clines: A 1.1 by 0.7 cm T1 precontrast hypointense lesion anteriorly in the right S1 vertebral body is new compared to the prior lumbar spine MRI and could represent an early bony metastatic lesion in this patient with history of widespread bony metastatic disease. These addendum will be called to the covering inpatient team by the Radiologist Assistant, and communication documented in the PACS or zVision Dashboard. Electronically Signed   By: Van Clines M.D.   On: 04/03/2016 10:48   Result Date: 04/03/2016 CLINICAL DATA:  Low back pain.   Various forms of cancer in the past. EXAM: MRI LUMBAR SPINE WITHOUT AND WITH CONTRAST TECHNIQUE: Multiplanar and multiecho pulse sequences of the lumbar spine were obtained without and with intravenous contrast. CONTRAST:  80m MULTIHANCE GADOBENATE DIMEGLUMINE 529 MG/ML IV SOLN COMPARISON:  09/01/2015 FINDINGS: Segmentation: The lowest lumbar type non-rib-bearing vertebra is labeled as L5. Alignment:  3 mm degenerative retrolisthesis at L4-5. Vertebrae: Type 3 degenerative endplate findings at LY2-3and L4-5, with enhancement along the left eccentric L3-4 degenerative endplate findings. Schmorl's node along the inferior endplate of L3, increased from prior. Previously anterior enhancing lesion at L1 no longer appreciated. Previous enhancing lesion along the superior endplate of TX43no longer seen. Conus medullaris: Extends to the L1 level and appears normal. Paraspinal and other soft tissues: Unremarkable Disc levels: L1-2: No impingement, right facet arthropathy and focal ligamentum flavum redundancy. L2-3: Moderate central narrowing of the thecal sac with mild left foraminal stenosis and mild bilateral subarticular lateral recess stenosis due to disc bulge, left paracentral disc protrusion extending caudad, and facet arthropathy. Similar appearance to prior. L3-4: The patient has had a remote prior left laminectomy at this level. However, there is new abnormal enhancing epidural tissue at this level extending nearly circumferentially but especially anteriorly and eccentric to the left, tracking from the top of the L3 vertebral body nearly to the bottom of the L4 vertebral body in the left lateral recess. This tissue appears to be diffusely enhancing and extends into the left L3-4 neural foramen. Prominent left foraminal stenosis and moderate central narrowing of the thecal sac are present. The left L4 nerve roots course through some of this enhancing tissue in the lateral recess. L4-5: There is accentuated  enhancement in the left neural foramen due to the epidural process extending into the left neural foramen. Moderate right and mild left foraminal stenosis. Moderate central narrowing of the thecal sac and mild bilateral subarticular lateral recess stenosis. L5-S1: Mild bilateral foraminal stenosis primarily from facet spurring, stable. IMPRESSION: 1. New abnormal epidural tissue enhancement centered at L3-4 and to the left, and extending both cephalad and caudad, possibly from tumor or epidural phlegmon/infection. The patient does have a left laminectomy at this level, but had this before without the epidural enhancing tissue, and accordingly epidural fibrosis as an explanation for this enhancing tissue seems unlikely.  There is some worsening of the endplate findings at L3-4, especially eccentric to the left, with considerable endplate enhancement, although without high T2 signal and enhancement in the intervertebral disc to further favor discitis -osteomyelitis. Enhancement associated with this process extends into the left neural foramina at L3-4 and L4-5. Correlate with any fever/leukocytosis. In conjunction with the spondylosis and degenerative disc disease at this level, there is prominent left foraminal stenosis and moderate central narrowing of the thecal sac at L3-4. 2. Lumbar spondylosis and degenerative disc disease also causes moderate impingement at L2-3 and L4-5, and mild impingement at L5-S1. These results will be called to the ordering clinician or representative by the Radiologist Assistant, and communication documented in the PACS or zVision Dashboard. Electronically Signed: By: Walter  Liebkemann M.D. On: 04/02/2016 16:12   Mr Hip Left W Wo Contrast  Result Date: 04/08/2016 CLINICAL DATA:  Left hip pain.  Back pain. EXAM: MRI OF THE LEFT HIP WITHOUT AND WITH CONTRAST TECHNIQUE: Multiplanar, multisequence MR imaging was performed both before and after administration of intravenous contrast.  CONTRAST:  15mL MULTIHANCE GADOBENATE DIMEGLUMINE 529 MG/ML IV SOLN COMPARISON:  None. FINDINGS: The patient was unable to complete the entirety of the examination secondary to severe pain. Coronal T1 fat saturated postcontrast images could not be obtained. Bones: Right total hip arthroplasty with susceptibility artifacts the adjacent soft tissue and osseous structures. No left hip fracture, dislocation or avascular necrosis. No aggressive osseous lesion. No periosteal reaction or bone destruction. Normal sacrum and sacroiliac joints. Degenerative disc disease with disc height loss at L4-5 and L5-S1. Bilateral facet arthropathy at L4-5 and L5-S1. Articular cartilage and labrum Articular cartilage: Partial-thickness cartilage loss of the left hip with subchondral reactive marrow changes in the superior acetabulum. Labrum:  Small superior anterior left labral tear. Joint or bursal effusion Joint effusion:  No joint effusion. Bursae:  No bursa formation. Muscles and tendons Flexors: Normal. Extensors: Normal. Abductors: Normal. Adductors: Mild edema and enhancement of the left adductor brevis and longus muscles likely reflecting mild muscle strain versus mild myositis. Rotators: Normal. Hamstrings: Normal. Other findings Miscellaneous: No fluid collection or hematoma. No inguinal lymphadenopathy. No inguinal hernia. IMPRESSION: 1. Mild-moderate osteoarthritis of the left hip. Partial-thickness cartilage loss of the left hip with subchondral reactive marrow changes in the superior acetabulum. 2. Small superior anterior left labral tear. Electronically Signed   By: Hetal  Patel   On: 04/08/2016 16:37   Ir Lumbar Disc Aspiration W/img Guide  Result Date: 04/08/2016 INDICATION: Severe low back pain secondary to discitis at L3-L4. EXAM: IR DISC ASPIRATION WITH IMAGE GUIDE MEDICATIONS: None. ANESTHESIA/SEDATION: Fentanyl 25 mcg IV; Versed 1 mg IV.  Dilaudid 1 mg IV. Moderate Sedation Time:  15 minutes The patient was  continuously monitored during the procedure by the interventional radiology nurse under my direct supervision. COMPLICATIONS: None immediate. PROCEDURE: Informed written consent was obtained from the patient after a thorough discussion of the procedural risks, benefits and alternatives. All questions were addressed. Maximal Sterile Barrier Technique was utilized including caps, mask, sterile gowns, sterile gloves, sterile drape, hand hygiene and skin antiseptic. A timeout was performed prior to the initiation of the procedure. The patient was placed prone on the fluoroscopic table. The skin overlying the lumbar region was then prepped and draped in the usual sterile fashion. The skin entry site to the right of the paraspinous midline at L3-L4 was then infiltrated with 0.25% bupivacaine. Thereafter, two passes were made with a 21 gauge Franseen biopsy needle into the   L3-L4 disc space in different locations. With a 20 mL syringe, thick, greenish, bloody aspirate was obtained for a total of 2 cc. The needles were then removed and hemostasis achieved at the skin entry sites. The specimens were then sent for microbiologic analysis. The patient tolerated the procedure well. She was then returned to her floor in good stable condition. IMPRESSION: Status post fluoroscopic guided needle placement at L3-L4 for aspiration as described for inflammatory discitis. Electronically Signed   By: Sanjeev  Deveshwar M.D.   On: 04/05/2016 09:27    , , DO  [facility] Pager [phone]  If 7PM-7AM, please contact night-coverage [email or web address] Password [email or web address] 04/09/2016, 9:22 AM   LOS: 6 days   

## 2016-04-09 NOTE — Progress Notes (Signed)
Pharmacy Antibiotic Note  Sara David is a 70 y.o. female admitted on 04/02/2016 with lumbar epidural infection.  Pharmacy has been consulted for Vancomycin dosing. PICC placed.   Now day #6 of abx for Lumbar epidural infection. WBC wnl. Afeb. ID on board. ID rec treat for cx neg epidural infection. SCr stable, CrCl ~26ml/min. UOP remains good.   Plan: Continue vancomycin 1g IV Q12 Continue CTX 2g IV Q24 Monitor clinical picture, renal function F/U  LOT  Height: 5\' 4"  (162.6 cm) Weight: 160 lb (72.6 kg) IBW/kg (Calculated) : 54.7  Temp (24hrs), Avg:98.8 F (37.1 C), Min:98.8 F (37.1 C), Max:98.8 F (37.1 C)   Recent Labs Lab 04/02/16 1350 04/03/16 0519 04/04/16 0249 04/05/16 0445 04/06/16 0342 04/07/16 0416 04/09/16 0420  WBC 5.0 5.2  --  6.2 5.3  --  3.3*  CREATININE 1.09* 0.90 1.05* 0.87 0.89  --  0.88  VANCOTROUGH  --   --   --   --   --  14*  --     Estimated Creatinine Clearance: 58.1 mL/min (by C-G formula based on SCr of 0.88 mg/dL).    Allergies  Allergen Reactions  . Carafate [Sucralfate] Nausea Only  . Diclofenac Sodium Nausea Only  . Erythromycin Diarrhea and Nausea And Vomiting  . Adhesive [Tape] Itching and Rash  . Wellbutrin [Bupropion] Itching and Rash    Thank you for allowing pharmacy to be a part of this patient's care.   Elenor Quinones, PharmD, BCPS Clinical Pharmacist Pager [phone] 04/09/2016 10:38 AM

## 2016-04-10 LAB — C-REACTIVE PROTEIN: CRP: 3.5 mg/dL — ABNORMAL HIGH (ref <1.0)

## 2016-04-10 LAB — SEDIMENTATION RATE: Sed Rate: 86 mm/hr — ABNORMAL HIGH (ref 0–22)

## 2016-04-10 NOTE — Care Management Note (Signed)
Case Management Note  Patient Details  Name: Kenadie Sloas MRN: [id number] Date of Birth: Nov 10, 1945  Subjective/Objective:                   Confirmed face sheet information with patient . Cell number is [id number], home number [id number].   Patient aware she may need IV ABX at home , also aware [facility] will teach her and family member how to administer IV ABX .   Patient already has rolling walker at home.   Will need prescriptions and HHRN order if IV ABX needed.  Action/Plan:   Expected Discharge Date:                  Expected Discharge Plan:  [place]  In-House Referral:     Discharge planning Services  CM Consult  Post Acute Care Choice:  Home Health Choice offered to:  Patient  DME Arranged:    DME Agency:     HH Arranged:  PT [place]:  [facility]  Status of Service:  In process, will continue to follow  If discussed at Long Length of Stay Meetings, dates discussed:    Additional Comments:  Marilu Favre, RN 04/10/2016, 2:55 PM

## 2016-04-10 NOTE — Progress Notes (Addendum)
PROGRESS NOTE  Suhani Stillion GQB:[id number] DOB: 31-Oct-1945 DOA: 04/02/2016 PCP: Marinda Elk, MD  Brief History:  70 y.o.femalewith a medical history ofmetastatic breast cancer to the thoracolumbar spine, gallbladder, and small bowel, hypertension, presented to the emergency department with complaints of back pain that has been worsening for 3 days. Patient states she's had back pain intermittently for the last 3 years; 04/02/2016 she began to have very sharp pain located in her lower back radiating to her left buttock. Patient was taking her pain medication without resolution of her symptoms or any improvement in her back pain. Patient did try to see her pain management doctor however could not obtain an appointment until 04/12/2016.  Initial MRI of the lumbar spine on 04/02/2016 revealed a new abnormal epidural tissue enhancement centered around L3-4 with enhancement extending to the left neuroforamina. She underwent discussed the operation on 04/04/2016. Cytology and cultures from that his aspiration were negative. ID was consulted, and the patient was started on ceftriaxone and vancomycin. The patient continued to have slow improvement. Repeat MRI was obtained on 04/08/2016.  Assessment/Plan: Low back pain/ Possible L3-L4 discitis, vs metastasis -MRI lumbar spine: new abnormal epidural tissue L3-4 and to the left, ?discitis vs osteomyelitis, had fever of 102.1 day of admission in ED -ESR 60, CRP 9.4 -Blood cultures--neg -04/04/16--IR did L3-L4 aspiration/biopsy, showed PMNs but no organisms--culture neg -ID Dr.Campbell consulting, Continue Rocephin and vancomycin, PICC placed -due to slow progress, repeat MRI L-spine obtained -04/08/16 MRI L-spine-continued mild progression of mass like ventral epidural enhancement -04/09/16-Dr.Tat discussed with Dr. Almon Register recommended to try to pursue repeat biopsy at L3-4 area, 04/09/16--reconsulted Dr. Estanislado Pandy for possible repeat  biopsy--will d/w Dr.Deveshwar today, pt has had radiation to thoracolumbar spine Dec 2017 for suspected metastatic cancer based on abnormal Bone scan in Nov'2016 -continue PT, pain control  Metastatic breast cancer -Follows Dr. Grayland Ormond [facility]) -Continue letrozole  Essential Hypertension -Recently taken off of her blood pressure medications one week ago due to hypotension -Blood pressure currently stable  Chronic normocytic anemia -Baseline hemoglobin between 10 and 11 -Continue to monitor CBC  Tobacco abuse -Discussed smoking cessation -Nicotine patch  Hypokalemia -replaced  Constipation -Continue MiraLAX  Hypothyroidism -Continue Synthroid  GERD -ContinuePPI  Acute delirium -Per nursing staff they describe what appears to be acute delirium evening 8/20, she is on multiple medication can cause delirium including narcotics, benzodiazepines and Benadryl. Unlikely this to be secondary to the infection.  -resolved  DNR Disposition Plan: not ready for d/c--needs SNF Family Communication: No  Family at bedside--Total time spent 35 minutes.  Greater than 50% spent face to face counseling and coordinating care.   Consultants:  ID, IR  DVT Prophylaxis:  SCDs  Procedures: As Listed in Progress Note Above:   Antibiotics: None    Subjective: Still has low back pain  Objective: Vitals:   04/09/16 1433 04/09/16 2044 04/09/16 2054 04/10/16 0443  BP: 134/61  (!) 148/62 (!) 142/66  Pulse: 73 78 70 68  Resp: 17 16 17 18   Temp: 97.8 F (36.6 C)  98 F (36.7 C) 98.3 F (36.8 C)  TempSrc: Oral  Oral Oral  SpO2: 97% 98% 99% 98%  Weight:      Height:        Intake/Output Summary (Last 24 hours) at 04/10/16 0941 Last data filed at 04/10/16 0600  Gross per 24 hour  Intake           2477.5  ml  Output             3350 ml  Net           -872.5 ml   Weight change:  Exam:   General:  Pt is alert, follows commands appropriately, not in acute  distress  HEENT: No icterus, No thrush, No neck mass, [place]/AT  Cardiovascular: RRR, S1/S2, no rubs, no gallops  Respiratory: CTA bilaterally, no wheezing, no crackles, no rhonchi  Abdomen: Soft/+BS, non tender, non distended, no guarding  Extremities: No edema, No lymphangitis, No petechiae, No rashes, no synovitis  Neuro:  CN II-XII intact, strength -/5 in RUE, RLE, strength 4-/5 LUE, LLE; sensation intact bilateral; no dysmetria; babinski equivocal     Data Reviewed: I have personally reviewed following labs and imaging studies Basic Metabolic Panel:  Recent Labs Lab 04/04/16 0249 04/05/16 0445 04/06/16 0342 04/09/16 0420  NA 137 136 136 139  K 4.3 4.2 3.6 3.7  CL 109 106 106 105  CO2 21* 24 24 27   GLUCOSE 110* 105* 101* 98  BUN 14 10 7 11   CREATININE 1.05* 0.87 0.89 0.88  CALCIUM 8.4* 8.2* 8.3* 8.6*   Liver Function Tests: No results for input(s): AST, ALT, ALKPHOS, BILITOT, PROT, ALBUMIN in the last 168 hours. No results for input(s): LIPASE, AMYLASE in the last 168 hours. No results for input(s): AMMONIA in the last 168 hours. Coagulation Profile:  Recent Labs Lab 04/04/16 0249  INR 1.03   CBC:  Recent Labs Lab 04/05/16 0445 04/06/16 0342 04/09/16 0420  WBC 6.2 5.3 3.3*  HGB 9.9* 10.0* 9.6*  HCT 31.4* 31.2* 29.8*  MCV 97.2 95.4 94.6  PLT 201 216 228   Cardiac Enzymes: No results for input(s): CKTOTAL, CKMB, CKMBINDEX, TROPONINI in the last 168 hours. BNP: Invalid input(s): POCBNP CBG: No results for input(s): GLUCAP in the last 168 hours. HbA1C: No results for input(s): HGBA1C in the last 72 hours. Urine analysis:    Component Value Date/Time   COLORURINE YELLOW (A) 10/13/2015 1250   APPEARANCEUR TURBID (A) 10/13/2015 1250   LABSPEC 1.023 10/13/2015 1250   PHURINE 5.0 10/13/2015 1250   GLUCOSEU NEGATIVE 10/13/2015 1250   HGBUR NEGATIVE 10/13/2015 1250   BILIRUBINUR NEGATIVE 10/13/2015 1250   KETONESUR NEGATIVE 10/13/2015 1250    PROTEINUR NEGATIVE 10/13/2015 1250   NITRITE NEGATIVE 10/13/2015 1250   LEUKOCYTESUR NEGATIVE 10/13/2015 1250   Sepsis Labs: @LABRCNTIP (procalcitonin:4,lacticidven:4) ) Recent Results (from the past 240 hour(s))  Blood culture (routine x 2)     Status: None   Collection Time: 04/02/16  4:54 PM  Result Value Ref Range Status   Specimen Description BLOOD RIGHT HAND  Final   Special Requests BOTTLES DRAWN AEROBIC AND ANAEROBIC 5CC  Final   Culture NO GROWTH 5 DAYS  Final   Report Status 04/07/2016 FINAL  Final  Blood culture (routine x 2)     Status: None   Collection Time: 04/02/16  5:03 PM  Result Value Ref Range Status   Specimen Description BLOOD LEFT HAND  Final   Special Requests BOTTLES DRAWN AEROBIC AND ANAEROBIC 5CC  Final   Culture NO GROWTH 5 DAYS  Final   Report Status 04/07/2016 FINAL  Final  Body fluid culture     Status: None   Collection Time: 04/04/16  4:08 PM  Result Value Ref Range Status   Specimen Description NEEDLE ASPIRATE  Final   Special Requests L3 TO L4 DISC SPACE  Final   Gram Stain  Final    ABUNDANT WBC PRESENT, PREDOMINANTLY PMN NO ORGANISMS SEEN    Culture NO GROWTH 3 DAYS  Final   Report Status 04/08/2016 FINAL  Final     Scheduled Meds: . cefTRIAXone (ROCEPHIN)  IV  2 g Intravenous Q24H  . citalopram  20 mg Oral BH-q7a  . letrozole  2.5 mg Oral Daily  . levothyroxine  100 mcg Oral QAC breakfast  . lidocaine  1 patch Transdermal Q24H  . loratadine  10 mg Oral Daily  . mometasone-formoterol  2 puff Inhalation BID  . montelukast  10 mg Oral QHS  . nicotine  21 mg Transdermal Daily  . pantoprazole  40 mg Oral Daily  . polyethylene glycol  17 g Oral Daily  . pravastatin  40 mg Oral QHS  . sodium chloride flush  10-40 mL Intracatheter Q12H  . vancomycin  1,000 mg Intravenous Q12H   Continuous Infusions:    Procedures/Studies: Mr Lumbar Spine W Wo Contrast  Result Date: 04/08/2016 CLINICAL DATA:  70 year old female with back and left  hip pain, not improving. Metastatic breast cancer. Abnormal enhancement in the lumbar spine, progressed at L3-L4 on a comparison 6 days ago. Status post fluoroscopic guided L3-L4 disc aspiration 4 days ago, lab analysis revealed abundant white cells present but no organisms or growth on culture. Subsequent encounter. EXAM: MRI LUMBAR SPINE WITHOUT AND WITH CONTRAST TECHNIQUE: Multiplanar and multiecho pulse sequences of the lumbar spine were obtained without and with intravenous contrast. CONTRAST:  15 mL MultiHance COMPARISON:  Lumbar MRI 04/02/2016 and earlier FINDINGS: Segmentation:  Normal, same numbering system as on prior studies. Alignment:  Stable vertebral height and alignment. Vertebrae: Unchanged confluent endplate marrow edema and enhancement about the L3-L4 disc. Interval new STIR hyperintensity within that disc space (series 3, image 7), but continued absent enhancement. Stable bone marrow signal elsewhere. Conus medullaris: Extends to the T12-L1 level and appears normal no abnormal intradural enhancement. No thickening of the cauda equina nerve roots. Paraspinal and other soft tissues: New small volume of free fluid adjacent to the inferior right hepatic lobe. Small volume of pelvic free fluid identified on sagittal images, new. Otherwise stable abdominal viscera. A ectatic infrarenal abdominal aorta again noted, up to 25 mm diameter. Disc levels: Stable disc spaces L2-L3 and above. L3-L4: Persistent and perhaps mildly increased broad-based ventral epidural enhancement with mass effect on the thecal sac (series 11, image 26). This extends out into the left L3 neural foramen as before. Subsequent multifactorial severe left L3 foraminal stenosis. The L4-L5 and lower disc spaces are stable. IMPRESSION: 1. Continued and perhaps mild progression of masslike ventral epidural enhancement at L3-L4 which extends into the left L3 neural foramen. Query left L3 radiculitis. Disc aspiration/biopsy microbiology  results were negative aside from WBCs. Recommend correlation with pathology results from that needle biopsy which are pending. 2. Interval increased STIR signal in the L3-L4 disc space which may be postprocedural in nature. Stable endplate edema and enhancement without endplate destruction. 3. Other visualized spinal levels are stable since 04/02/2016. 4. New free fluid in the pelvis, and to a lesser extent in the right abdomen nonspecific. 5. Ectatic abdominal aorta at risk for aneurysm development. Recommend followup by ultrasound in 5 years. This recommendation follows ACR consensus guidelines: White Paper of the ACR Incidental Findings Committee II on Vascular Findings. J Am Coll Radiol 2013; 10:789-794. Electronically Signed   By: Genevie Ann M.D.   On: 04/08/2016 16:00   Mr Lumbar Spine W Wo Contrast  Addendum Date: 04/03/2016   ADDENDUM REPORT: 04/03/2016 10:48 ADDENDUM: The original report was by Dr. Van Clines. The following addendum is by Dr. Van Clines: A 1.1 by 0.7 cm T1 precontrast hypointense lesion anteriorly in the right S1 vertebral body is new compared to the prior lumbar spine MRI and could represent an early bony metastatic lesion in this patient with history of widespread bony metastatic disease. These addendum will be called to the covering inpatient team by the Radiologist Assistant, and communication documented in the PACS or zVision Dashboard. Electronically Signed   By: Van Clines M.D.   On: 04/03/2016 10:48   Result Date: 04/03/2016 CLINICAL DATA:  Low back pain.  Various forms of cancer in the past. EXAM: MRI LUMBAR SPINE WITHOUT AND WITH CONTRAST TECHNIQUE: Multiplanar and multiecho pulse sequences of the lumbar spine were obtained without and with intravenous contrast. CONTRAST:  4m MULTIHANCE GADOBENATE DIMEGLUMINE 529 MG/ML IV SOLN COMPARISON:  09/01/2015 FINDINGS: Segmentation: The lowest lumbar type non-rib-bearing vertebra is labeled as L5. Alignment:  3  mm degenerative retrolisthesis at L4-5. Vertebrae: Type 3 degenerative endplate findings at LU8-8and L4-5, with enhancement along the left eccentric L3-4 degenerative endplate findings. Schmorl's node along the inferior endplate of L3, increased from prior. Previously anterior enhancing lesion at L1 no longer appreciated. Previous enhancing lesion along the superior endplate of TB16no longer seen. Conus medullaris: Extends to the L1 level and appears normal. Paraspinal and other soft tissues: Unremarkable Disc levels: L1-2: No impingement, right facet arthropathy and focal ligamentum flavum redundancy. L2-3: Moderate central narrowing of the thecal sac with mild left foraminal stenosis and mild bilateral subarticular lateral recess stenosis due to disc bulge, left paracentral disc protrusion extending caudad, and facet arthropathy. Similar appearance to prior. L3-4: The patient has had a remote prior left laminectomy at this level. However, there is new abnormal enhancing epidural tissue at this level extending nearly circumferentially but especially anteriorly and eccentric to the left, tracking from the top of the L3 vertebral body nearly to the bottom of the L4 vertebral body in the left lateral recess. This tissue appears to be diffusely enhancing and extends into the left L3-4 neural foramen. Prominent left foraminal stenosis and moderate central narrowing of the thecal sac are present. The left L4 nerve roots course through some of this enhancing tissue in the lateral recess. L4-5: There is accentuated enhancement in the left neural foramen due to the epidural process extending into the left neural foramen. Moderate right and mild left foraminal stenosis. Moderate central narrowing of the thecal sac and mild bilateral subarticular lateral recess stenosis. L5-S1: Mild bilateral foraminal stenosis primarily from facet spurring, stable. IMPRESSION: 1. New abnormal epidural tissue enhancement centered at L3-4 and  to the left, and extending both cephalad and caudad, possibly from tumor or epidural phlegmon/infection. The patient does have a left laminectomy at this level, but had this before without the epidural enhancing tissue, and accordingly epidural fibrosis as an explanation for this enhancing tissue seems unlikely. There is some worsening of the endplate findings at LX4-5 especially eccentric to the left, with considerable endplate enhancement, although without high T2 signal and enhancement in the intervertebral disc to further favor discitis -osteomyelitis. Enhancement associated with this process extends into the left neural foramina at L3-4 and L4-5. Correlate with any fever/leukocytosis. In conjunction with the spondylosis and degenerative disc disease at this level, there is prominent left foraminal stenosis and moderate central narrowing of the thecal sac at L3-4. 2. Lumbar  spondylosis and degenerative disc disease also causes moderate impingement at L2-3 and L4-5, and mild impingement at L5-S1. These results will be called to the ordering clinician or representative by the Radiologist Assistant, and communication documented in the PACS or zVision Dashboard. Electronically Signed: By: Van Clines M.D. On: 04/02/2016 16:12   Mr Hip Left W Wo Contrast  Result Date: 04/08/2016 CLINICAL DATA:  Left hip pain.  Back pain. EXAM: MRI OF THE LEFT HIP WITHOUT AND WITH CONTRAST TECHNIQUE: Multiplanar, multisequence MR imaging was performed both before and after administration of intravenous contrast. CONTRAST:  70m MULTIHANCE GADOBENATE DIMEGLUMINE 529 MG/ML IV SOLN COMPARISON:  None. FINDINGS: The patient was unable to complete the entirety of the examination secondary to severe pain. Coronal T1 fat saturated postcontrast images could not be obtained. Bones: Right total hip arthroplasty with susceptibility artifacts the adjacent soft tissue and osseous structures. No left hip fracture, dislocation or avascular  necrosis. No aggressive osseous lesion. No periosteal reaction or bone destruction. Normal sacrum and sacroiliac joints. Degenerative disc disease with disc height loss at L4-5 and L5-S1. Bilateral facet arthropathy at L4-5 and L5-S1. Articular cartilage and labrum Articular cartilage: Partial-thickness cartilage loss of the left hip with subchondral reactive marrow changes in the superior acetabulum. Labrum:  Small superior anterior left labral tear. Joint or bursal effusion Joint effusion:  No joint effusion. Bursae:  No bursa formation. Muscles and tendons Flexors: Normal. Extensors: Normal. Abductors: Normal. Adductors: Mild edema and enhancement of the left adductor brevis and longus muscles likely reflecting mild muscle strain versus mild myositis. Rotators: Normal. Hamstrings: Normal. Other findings Miscellaneous: No fluid collection or hematoma. No inguinal lymphadenopathy. No inguinal hernia. IMPRESSION: 1. Mild-moderate osteoarthritis of the left hip. Partial-thickness cartilage loss of the left hip with subchondral reactive marrow changes in the superior acetabulum. 2. Small superior anterior left labral tear. Electronically Signed   By: HKathreen Devoid  On: 04/08/2016 16:37   Ir Lumbar Disc Aspiration W/img Guide  Result Date: 04/08/2016 INDICATION: Severe low back pain secondary to discitis at L3-L4. EXAM: IR DISC ASPIRATION WITH IMAGE GUIDE MEDICATIONS: None. ANESTHESIA/SEDATION: Fentanyl 25 mcg IV; Versed 1 mg IV.  Dilaudid 1 mg IV. Moderate Sedation Time:  15 minutes The patient was continuously monitored during the procedure by the interventional radiology nurse under my direct supervision. COMPLICATIONS: None immediate. PROCEDURE: Informed written consent was obtained from the patient after a thorough discussion of the procedural risks, benefits and alternatives. All questions were addressed. Maximal Sterile Barrier Technique was utilized including caps, mask, sterile gowns, sterile gloves,  sterile drape, hand hygiene and skin antiseptic. A timeout was performed prior to the initiation of the procedure. The patient was placed prone on the fluoroscopic table. The skin overlying the lumbar region was then prepped and draped in the usual sterile fashion. The skin entry site to the right of the paraspinous midline at L3-L4 was then infiltrated with 0.25% bupivacaine. Thereafter, two passes were made with a 21 gauge Franseen biopsy needle into the L3-L4 disc space in different locations. With a 20 mL syringe, thick, greenish, bloody aspirate was obtained for a total of 2 cc. The needles were then removed and hemostasis achieved at the skin entry sites. The specimens were then sent for microbiologic analysis. The patient tolerated the procedure well. She was then returned to her floor in good stable condition. IMPRESSION: Status post fluoroscopic guided needle placement at L3-L4 for aspiration as described for inflammatory discitis. Electronically Signed   By: SWillaim Rayas  Deveshwar M.D.   On: 04/05/2016 09:27    Domenic Polite, MD [facility] Pager [phone]  If 7PM-7AM, please contact night-coverage [email or web address] Password [facility] 04/10/2016, 9:41 AM   LOS: 7 days

## 2016-04-10 NOTE — Progress Notes (Signed)
Patient ID: Sara David, female   DOB: April 12, 1946, 70 y.o.   MRN: [id number]          [facility]    Date of Admission:  04/02/2016           Day 7 vancomycin         Day 7 ceftriaxone  Principal Problem:   Acute low back pain Active Problems:   Fever   Gastroesophageal reflux disease   Hypothyroidism   Breast cancer metastasized to multiple sites [facility])   Malignant neoplasm of upper-outer quadrant of right female breast ([place])   Benign essential HTN   Discitis of lumbar region   . cefTRIAXone (ROCEPHIN)  IV  2 g Intravenous Q24H  . citalopram  20 mg Oral BH-q7a  . letrozole  2.5 mg Oral Daily  . levothyroxine  100 mcg Oral QAC breakfast  . lidocaine  1 patch Transdermal Q24H  . loratadine  10 mg Oral Daily  . mometasone-formoterol  2 puff Inhalation BID  . montelukast  10 mg Oral QHS  . nicotine  21 mg Transdermal Daily  . pantoprazole  40 mg Oral Daily  . polyethylene glycol  17 g Oral Daily  . pravastatin  40 mg Oral QHS  . sodium chloride flush  10-40 mL Intracatheter Q12H  . vancomycin  1,000 mg Intravenous Q12H    SUBJECTIVE: She is feeling much better over the past week. She is no longer having that sharp and debilitating pain in her lower back and left leg. She was able to walk much more easily today.  Review of Systems: Review of Systems  Constitutional: Positive for malaise/fatigue. Negative for chills, diaphoresis and fever.  Gastrointestinal: Negative for abdominal pain, diarrhea, nausea and vomiting.  Musculoskeletal: Positive for back pain.    Past Medical History:  Diagnosis Date  . Arthritis    "back" (04/03/2016)  . Breast cancer ([place]) 2012    no lumpectomy. Treating with meds  . Cancer ([place])    small intestines 2012, gallbladder 2016  . Chronic lower back pain   . COPD (chronic obstructive pulmonary disease) ([place])    "use inhalers for COPD that dx'd at one time" (04/03/2016)  . Family history of adverse reaction to  anesthesia    daughter gets nauseated  . GERD (gastroesophageal reflux disease)   . High cholesterol   . Hypertension   . Hypothyroidism   . Skin cancer    "right neck; mid forehead"    Social History  Substance Use Topics  . Smoking status: Current Every Day Smoker    Packs/day: 0.75    Years: 54.00    Types: Cigarettes  . Smokeless tobacco: Never Used  . Alcohol use No    Family History  Problem Relation Age of Onset  . Lung cancer Brother 77  . Heart disease Father   . Diabetes Father    Allergies  Allergen Reactions  . Carafate [Sucralfate] Nausea Only  . Diclofenac Sodium Nausea Only  . Erythromycin Diarrhea and Nausea And Vomiting  . Adhesive [Tape] Itching and Rash  . Wellbutrin [Bupropion] Itching and Rash    OBJECTIVE: Vitals:   04/09/16 2054 04/10/16 0443 04/10/16 0941 04/10/16 1403  BP: (!) 148/62 (!) 142/66  (!) 136/57  Pulse: 70 68  74  Resp: 17 18    Temp: 98 F (36.7 C) 98.3 F (36.8 C)  98.3 F (36.8 C)  TempSrc: Oral Oral  Oral  SpO2: 99% 98% 98% 98%  Weight:      Height:       Body mass index is 27.46 kg/m.  Physical Exam  Constitutional:  She is looking much better today. She is talking on the phone and is obviously much more comfortable.  Cardiovascular: Normal rate and regular rhythm.   No murmur heard. Pulmonary/Chest: Effort normal and breath sounds normal. She has no rales.  Abdominal: Soft. There is no tenderness.  Musculoskeletal:  She is able to move around in bed more comfortably now.  Skin: No rash noted.   Left arm PICC.    Lab Results Lab Results  Component Value Date   WBC 3.3 (L) 04/09/2016   HGB 9.6 (L) 04/09/2016   HCT 29.8 (L) 04/09/2016   MCV 94.6 04/09/2016   PLT 228 04/09/2016    Lab Results  Component Value Date   CREATININE 0.88 04/09/2016   BUN 11 04/09/2016   NA 139 04/09/2016   K 3.7 04/09/2016   CL 105 04/09/2016   CO2 27 04/09/2016    Lab Results  Component Value Date   ALT 54  04/03/2016   AST 52 (H) 04/03/2016   ALKPHOS 80 04/03/2016   BILITOT 0.3 04/03/2016     Microbiology: Recent Results (from the past 240 hour(s))  Blood culture (routine x 2)     Status: None   Collection Time: 04/02/16  4:54 PM  Result Value Ref Range Status   Specimen Description BLOOD RIGHT HAND  Final   Special Requests BOTTLES DRAWN AEROBIC AND ANAEROBIC 5CC  Final   Culture NO GROWTH 5 DAYS  Final   Report Status 04/07/2016 FINAL  Final  Blood culture (routine x 2)     Status: None   Collection Time: 04/02/16  5:03 PM  Result Value Ref Range Status   Specimen Description BLOOD LEFT HAND  Final   Special Requests BOTTLES DRAWN AEROBIC AND ANAEROBIC 5CC  Final   Culture NO GROWTH 5 DAYS  Final   Report Status 04/07/2016 FINAL  Final  Body fluid culture     Status: None   Collection Time: 04/04/16  4:08 PM  Result Value Ref Range Status   Specimen Description NEEDLE ASPIRATE  Final   Special Requests L3 TO L4 DISC SPACE  Final   Gram Stain   Final    ABUNDANT WBC PRESENT, PREDOMINANTLY PMN NO ORGANISMS SEEN    Culture NO GROWTH 3 DAYS  Final   Report Status 04/08/2016 FINAL  Final     ASSESSMENT: Although her lumbar aspirate Gram stain and cultures were negative the fact that her pain came on so abruptly and was so severe combined with her initial fever and good response to empiric antibiotics all support a diagnosis of lumbar infection. I will continue vancomycin and ceftriaxone for 5 more weeks through 05/15/2016. I will sign off now.  PLAN: 1. Continue current antibiotics through 05/15/2016 2. I will arrange follow-up in my clinic  Michel Bickers, [place] [facility] [phone] pager   [phone] cell 04/10/2016, 5:32 PM

## 2016-04-10 NOTE — Progress Notes (Addendum)
Physical Therapy Treatment Patient Details Name: Sara David MRN: [id number] DOB: Aug 20, 1945 Today's Date: 04/10/2016    History of Present Illness pt presents with Low Back Pain with MRI indicating Epidural Infection vs Mets.  pt with hx of Breast CA with mets to R Hip, small intestines, and gall bladder.  pt also with hx of HTN and R THR.      PT Comments    PT session focused on functional mobility with transfers/bed mobility and general back care as well as ambulation. Pt able to increase her ambulation to 150 ft with rw during PT session. At this time the pt states that she wants to return home following her hospital stay. PT to continue to follow and progress mobility to assist with anticipated D/C to home.    Follow Up Recommendations  Home health PT;Supervision for mobility/OOB     Equipment Recommendations  Rolling walker with 5" wheels    Recommendations for Other Services       Precautions / Restrictions Precautions Precautions: Fall;Back Restrictions Weight Bearing Restrictions: No    Mobility  Bed Mobility Overal bed mobility: Needs Assistance Bed Mobility: Supine to Sit;Sit to Supine Rolling: Supervision Sidelying to sit: Supervision   Sit to supine: Supervision   General bed mobility comments: pt needing encouragement for utilizing logroll technique, pt declining to use with sit-to supine.   Transfers Overall transfer level: Needs assistance Equipment used: Rolling walker (2 wheeled) Transfers: Sit to/from Stand Sit to Stand: Min guard         General transfer comment: using good technique, min guard for safety.   Ambulation/Gait Ambulation/Gait assistance: Min guard;Supervision Ambulation Distance (Feet): 150 Feet Assistive device: Rolling walker (2 wheeled) Gait Pattern/deviations: Step-to pattern;Trunk flexed Gait velocity: decreased   General Gait Details: Pt reports having to lean forward to relieve back pain.    Stairs             Wheelchair Mobility    Modified Rankin (Stroke Patients Only)       Balance Overall balance assessment: Needs assistance Sitting-balance support: No upper extremity supported Sitting balance-Leahy Scale: Good     Standing balance support: No upper extremity supported Standing balance-Leahy Scale: Fair                      Cognition Arousal/Alertness: Awake/alert Behavior During Therapy: WFL for tasks assessed/performed Overall Cognitive Status: Within Functional Limits for tasks assessed                      Exercises Total Joint Exercises Ankle Circles/Pumps: AROM;Both;10 reps Quad Sets: Strengthening;Both;10 reps Gluteal Sets: Strengthening;Both;10 reps Heel Slides: AROM;Both;5 reps Hip ABduction/ADduction: Strengthening;Both;5 reps    General Comments        Pertinent Vitals/Pain Pain Assessment: 0-10 Pain Score: 5  Pain Location: low back Pain Descriptors / Indicators:  (hurts) Pain Intervention(s): Limited activity within patient's tolerance;Monitored during session    Home Living                      Prior Function            PT Goals (current goals can now be found in the care plan section) Acute Rehab PT Goals Patient Stated Goal: know what is going on with her back.  PT Goal Formulation: With patient Time For Goal Achievement: 04/21/16 Potential to Achieve Goals: Good Progress towards PT goals: Progressing toward goals    Frequency  Min 3X/week  PT Plan Discharge plan needs to be updated    Co-evaluation             End of Session Equipment Utilized During Treatment: Gait belt Activity Tolerance: No increased pain;Patient limited by fatigue Patient left: in bed;with call bell/phone within reach     Time: 1217-1243 PT Time Calculation (min) (ACUTE ONLY): 26 min  Charges:  $Gait Training: 8-22 mins $Therapeutic Exercise: 8-22 mins                    G Codes:      Cassell Clement, PT,  CSCS Pager [phone] Office [phone]  04/10/2016, 1:43 PM

## 2016-04-10 NOTE — Care Management Important Message (Signed)
Important Message  Patient Details  Name: Sara David MRN: [id number] Date of Birth: [birth year]   Medicare Important Message Given:  Yes    Deserie Dirks [address] 04/10/2016, 11:54 AM

## 2016-04-10 NOTE — Consult Note (Signed)
Chief Complaint: Patient was seen in consultation today for lumbar bone biopsy vs epidural enhancement biopsy Chief Complaint  Patient presents with  . Back Pain  . Abdominal Pain   at the request of Dr Domenic Polite  Referring Physician(s): Dr Michel Bickers  Supervising Physician: [name]  Patient Status: Inpatient  History of Present Illness: Sara David is a 70 y.o. female   Pt with Hx Breast Ca with metastasis to spine; GB and small intestine Worsening back pain Fever Abnormal MRI 8/15  Lumbar 3-4 disc aspiration performed in IR with Dr Estanislado Pandy 04/04/16  Diagnosis BONE FINE NEEDLE ASPIRATION, DISC SPACE L3/L4 (SPECIMEN 1 OF 1, COLLECTED 04/05/16): THE SPECIMEN CONSISTS OF NECROTIC AND CALCIFIC DEBRIS WITH ASSOCIATED ACUTE INFLAMMATION, SEE COMMENT.  Continued back pain Disc aspiration non diagnostic  MRI 8/21: IMPRESSION: 1. Continued and perhaps mild progression of masslike ventral epidural enhancement at L3-L4 which extends into the left L3 neural foramen. Query left L3 radiculitis. Disc aspiration/biopsy microbiology results were negative aside from WBCs. Recommend correlation with pathology results from that needle biopsy which are pending. 2. Interval increased STIR signal in the L3-L4 disc space which may be postprocedural in nature. Stable endplate edema and enhancement without endplate destruction. 3. Other visualized spinal levels are stable since 04/02/2016. 4. New free fluid in the pelvis, and to a lesser extent in the right abdomen nonspecific.  Request for lumbar bone bx vs epidural enhancement biopsy per Dr Megan Salon and Dr Broadus John Dr Estanislado Pandy has reviewed imaging and approves procedure   Past Medical History:  Diagnosis Date  . Arthritis    "back" (04/03/2016)  . Breast cancer ([place]) 2012    no lumpectomy. Treating with meds  . Cancer ([place])    small intestines 2012, gallbladder 2016  . Chronic lower back pain   . COPD  (chronic obstructive pulmonary disease) ([place])    "use inhalers for COPD that dx'd at one time" (04/03/2016)  . Family history of adverse reaction to anesthesia    daughter gets nauseated  . GERD (gastroesophageal reflux disease)   . High cholesterol   . Hypertension   . Hypothyroidism   . Skin cancer    "right neck; mid forehead"    Past Surgical History:  Procedure Laterality Date  . ABDOMINAL HERNIA REPAIR     "after they took tumor out of my colon I developed a hernia"  . ABDOMINAL HYSTERECTOMY    . BACK SURGERY    . BREAST BIOPSY Right 2012   positive  . BREAST BIOPSY Right 2014   positive  . BREAST EXCISIONAL BIOPSY Right 1995   benign  . CARPAL TUNNEL RELEASE Left   . DILATION AND CURETTAGE OF UTERUS     S/P miscarriage  . IR GENERIC HISTORICAL  04/04/2016   IR LUMBAR DISC ASPIRATION W/IMG GUIDE 04/04/2016 [name], MD [facility]  . JOINT REPLACEMENT    . LAPAROSCOPIC CHOLECYSTECTOMY  2016  . SKIN CANCER EXCISION     "right neck; mid forehead"  . TOTAL HIP ARTHROPLASTY Right 10/03/2015   Procedure: TOTAL HIP ARTHROPLASTY ANTERIOR APPROACH;  Surgeon: Hessie Knows, MD;  Location: [facility] ORS;  Service: Orthopedics;  Laterality: Right;  . TUBAL LIGATION    . TUMOR EXCISION  2012   "small intestine; came from the breast cancer"    Allergies: Carafate [sucralfate]; Diclofenac sodium; Erythromycin; Adhesive [tape]; and Wellbutrin [bupropion]  Medications: Prior to Admission medications   Medication Sig Start Date End Date Taking? Authorizing Provider  acetaminophen (TYLENOL) 500 MG tablet Take 1,000 mg by mouth daily as needed for fever.    Yes Historical Provider, MD  cetirizine (ZYRTEC) 10 MG tablet Take 10 mg by mouth daily as needed for allergies. Reported on 10/03/2015   Yes Historical Provider, MD  citalopram (CELEXA) 20 MG tablet Take 20 mg by mouth every morning.    Yes Historical Provider, MD  Fluticasone-Salmeterol (ADVAIR) 100-50 MCG/DOSE AEPB Inhale 1  puff into the lungs every morning.    Yes Historical Provider, MD  HYDROcodone-acetaminophen (NORCO/VICODIN) 5-325 MG tablet Take 1 tablet by mouth every 6 (six) hours as needed for moderate pain. Patient taking differently: Take 1 tablet by mouth every 4 (four) hours as needed for moderate pain.  11/22/15  Yes Evlyn Kanner, NP  letrozole (FEMARA) 2.5 MG tablet TAKE 1 TABLET ONE TIME DAILY Patient taking differently: TAKE 1 TABLET ONE TIME DAILY in am 05/13/15  Yes Lloyd Huger, MD  levothyroxine (SYNTHROID, LEVOTHROID) 100 MCG tablet Take 100 mcg by mouth daily before breakfast.    Yes Historical Provider, MD  montelukast (SINGULAIR) 10 MG tablet Take 10 mg by mouth at bedtime.    Yes Historical Provider, MD  Multiple Vitamins-Minerals (CENTRUM SILVER PO) Take 1 tablet by mouth every morning.   Yes Historical Provider, MD  omeprazole (PRILOSEC) 20 MG capsule Take 20 mg by mouth every morning.    Yes Historical Provider, MD  polyethylene glycol (MIRALAX / GLYCOLAX) packet Take 17 g by mouth daily as needed for mild constipation.   Yes Historical Provider, MD  pravastatin (PRAVACHOL) 40 MG tablet Take 40 mg by mouth at bedtime.    Yes Historical Provider, MD  zolpidem (AMBIEN) 10 MG tablet Take 1 tablet (10 mg total) by mouth at bedtime as needed. for sleep 03/20/16  Yes Lloyd Huger, MD     Family History  Problem Relation Age of Onset  . Lung cancer Brother 29  . Heart disease Father   . Diabetes Father     Social History   Social History  . Marital status: Married    Spouse name: N/A  . Number of children: N/A  . Years of education: N/A   Social History Main Topics  . Smoking status: Current Every Day Smoker    Packs/day: 0.75    Years: 54.00    Types: Cigarettes  . Smokeless tobacco: Never Used  . Alcohol use No  . Drug use: No  . Sexual activity: Not Currently   Other Topics Concern  . None   Social History Narrative  . None     Review of Systems: A 12  point ROS discussed and pertinent positives are indicated in the HPI above.  All other systems are negative.  Review of Systems  Constitutional: Positive for activity change and fatigue. Negative for fever.  Musculoskeletal: Positive for back pain and gait problem.  Neurological: Positive for weakness.  Psychiatric/Behavioral: Negative for behavioral problems and confusion.    Vital Signs: BP (!) 136/57 (BP Location: Right Arm)   Pulse 74   Temp 98.3 F (36.8 C) (Oral)   Resp 18   Ht 5\' 4"  (1.626 m)   Wt 160 lb (72.6 kg)   SpO2 98%   BMI 27.46 kg/m   Physical Exam  Constitutional: She is oriented to person, place, and time.  Cardiovascular: Normal rate and regular rhythm.   Pulmonary/Chest: Effort normal and breath sounds normal.  Abdominal: Soft. Bowel sounds are normal.  Musculoskeletal: Normal  range of motion.  Low back pain  Neurological: She is alert and oriented to person, place, and time.  Skin: Skin is warm and dry.  Psychiatric: She has a normal mood and affect. Her behavior is normal. Judgment and thought content normal.  Nursing note and vitals reviewed.   Mallampati Score:  MD Evaluation Airway: WNL Heart: WNL Abdomen: WNL Chest/ Lungs: WNL ASA  Classification: 3 Mallampati/Airway Score: One  Imaging: Mr Lumbar Spine W Wo Contrast  Result Date: 04/08/2016 CLINICAL DATA:  70 year old female with back and left hip pain, not improving. Metastatic breast cancer. Abnormal enhancement in the lumbar spine, progressed at L3-L4 on a comparison 6 days ago. Status post fluoroscopic guided L3-L4 disc aspiration 4 days ago, lab analysis revealed abundant white cells present but no organisms or growth on culture. Subsequent encounter. EXAM: MRI LUMBAR SPINE WITHOUT AND WITH CONTRAST TECHNIQUE: Multiplanar and multiecho pulse sequences of the lumbar spine were obtained without and with intravenous contrast. CONTRAST:  15 mL MultiHance COMPARISON:  Lumbar MRI 04/02/2016 and  earlier FINDINGS: Segmentation:  Normal, same numbering system as on prior studies. Alignment:  Stable vertebral height and alignment. Vertebrae: Unchanged confluent endplate marrow edema and enhancement about the L3-L4 disc. Interval new STIR hyperintensity within that disc space (series 3, image 7), but continued absent enhancement. Stable bone marrow signal elsewhere. Conus medullaris: Extends to the T12-L1 level and appears normal no abnormal intradural enhancement. No thickening of the cauda equina nerve roots. Paraspinal and other soft tissues: New small volume of free fluid adjacent to the inferior right hepatic lobe. Small volume of pelvic free fluid identified on sagittal images, new. Otherwise stable abdominal viscera. A ectatic infrarenal abdominal aorta again noted, up to 25 mm diameter. Disc levels: Stable disc spaces L2-L3 and above. L3-L4: Persistent and perhaps mildly increased broad-based ventral epidural enhancement with mass effect on the thecal sac (series 11, image 26). This extends out into the left L3 neural foramen as before. Subsequent multifactorial severe left L3 foraminal stenosis. The L4-L5 and lower disc spaces are stable. IMPRESSION: 1. Continued and perhaps mild progression of masslike ventral epidural enhancement at L3-L4 which extends into the left L3 neural foramen. Query left L3 radiculitis. Disc aspiration/biopsy microbiology results were negative aside from WBCs. Recommend correlation with pathology results from that needle biopsy which are pending. 2. Interval increased STIR signal in the L3-L4 disc space which may be postprocedural in nature. Stable endplate edema and enhancement without endplate destruction. 3. Other visualized spinal levels are stable since 04/02/2016. 4. New free fluid in the pelvis, and to a lesser extent in the right abdomen nonspecific. 5. Ectatic abdominal aorta at risk for aneurysm development. Recommend followup by ultrasound in 5 years. This  recommendation follows ACR consensus guidelines: White Paper of the ACR Incidental Findings Committee II on Vascular Findings. J Am Coll Radiol 2013; 10:789-794. Electronically Signed   By: Genevie Ann M.D.   On: 04/08/2016 16:00   Mr Lumbar Spine W Wo Contrast  Addendum Date: 04/03/2016   ADDENDUM REPORT: 04/03/2016 10:48 ADDENDUM: The original report was by Dr. Van Clines. The following addendum is by Dr. Van Clines: A 1.1 by 0.7 cm T1 precontrast hypointense lesion anteriorly in the right S1 vertebral body is new compared to the prior lumbar spine MRI and could represent an early bony metastatic lesion in this patient with history of widespread bony metastatic disease. These addendum will be called to the covering inpatient team by the Radiologist Assistant, and communication documented  in the PACS or zVision Dashboard. Electronically Signed   By: Van Clines M.D.   On: 04/03/2016 10:48   Result Date: 04/03/2016 CLINICAL DATA:  Low back pain.  Various forms of cancer in the past. EXAM: MRI LUMBAR SPINE WITHOUT AND WITH CONTRAST TECHNIQUE: Multiplanar and multiecho pulse sequences of the lumbar spine were obtained without and with intravenous contrast. CONTRAST:  53mL MULTIHANCE GADOBENATE DIMEGLUMINE 529 MG/ML IV SOLN COMPARISON:  09/01/2015 FINDINGS: Segmentation: The lowest lumbar type non-rib-bearing vertebra is labeled as L5. Alignment:  3 mm degenerative retrolisthesis at L4-5. Vertebrae: Type 3 degenerative endplate findings at X33443 and L4-5, with enhancement along the left eccentric L3-4 degenerative endplate findings. Schmorl's node along the inferior endplate of L3, increased from prior. Previously anterior enhancing lesion at L1 no longer appreciated. Previous enhancing lesion along the superior endplate of 624THL no longer seen. Conus medullaris: Extends to the L1 level and appears normal. Paraspinal and other soft tissues: Unremarkable Disc levels: L1-2: No impingement, right facet  arthropathy and focal ligamentum flavum redundancy. L2-3: Moderate central narrowing of the thecal sac with mild left foraminal stenosis and mild bilateral subarticular lateral recess stenosis due to disc bulge, left paracentral disc protrusion extending caudad, and facet arthropathy. Similar appearance to prior. L3-4: The patient has had a remote prior left laminectomy at this level. However, there is new abnormal enhancing epidural tissue at this level extending nearly circumferentially but especially anteriorly and eccentric to the left, tracking from the top of the L3 vertebral body nearly to the bottom of the L4 vertebral body in the left lateral recess. This tissue appears to be diffusely enhancing and extends into the left L3-4 neural foramen. Prominent left foraminal stenosis and moderate central narrowing of the thecal sac are present. The left L4 nerve roots course through some of this enhancing tissue in the lateral recess. L4-5: There is accentuated enhancement in the left neural foramen due to the epidural process extending into the left neural foramen. Moderate right and mild left foraminal stenosis. Moderate central narrowing of the thecal sac and mild bilateral subarticular lateral recess stenosis. L5-S1: Mild bilateral foraminal stenosis primarily from facet spurring, stable. IMPRESSION: 1. New abnormal epidural tissue enhancement centered at L3-4 and to the left, and extending both cephalad and caudad, possibly from tumor or epidural phlegmon/infection. The patient does have a left laminectomy at this level, but had this before without the epidural enhancing tissue, and accordingly epidural fibrosis as an explanation for this enhancing tissue seems unlikely. There is some worsening of the endplate findings at X33443, especially eccentric to the left, with considerable endplate enhancement, although without high T2 signal and enhancement in the intervertebral disc to further favor discitis  -osteomyelitis. Enhancement associated with this process extends into the left neural foramina at L3-4 and L4-5. Correlate with any fever/leukocytosis. In conjunction with the spondylosis and degenerative disc disease at this level, there is prominent left foraminal stenosis and moderate central narrowing of the thecal sac at L3-4. 2. Lumbar spondylosis and degenerative disc disease also causes moderate impingement at L2-3 and L4-5, and mild impingement at L5-S1. These results will be called to the ordering clinician or representative by the Radiologist Assistant, and communication documented in the PACS or zVision Dashboard. Electronically Signed: By: Van Clines M.D. On: 04/02/2016 16:12   Mr Hip Left W Wo Contrast  Result Date: 04/08/2016 CLINICAL DATA:  Left hip pain.  Back pain. EXAM: MRI OF THE LEFT HIP WITHOUT AND WITH CONTRAST TECHNIQUE: Multiplanar, multisequence  MR imaging was performed both before and after administration of intravenous contrast. CONTRAST:  4mL MULTIHANCE GADOBENATE DIMEGLUMINE 529 MG/ML IV SOLN COMPARISON:  None. FINDINGS: The patient was unable to complete the entirety of the examination secondary to severe pain. Coronal T1 fat saturated postcontrast images could not be obtained. Bones: Right total hip arthroplasty with susceptibility artifacts the adjacent soft tissue and osseous structures. No left hip fracture, dislocation or avascular necrosis. No aggressive osseous lesion. No periosteal reaction or bone destruction. Normal sacrum and sacroiliac joints. Degenerative disc disease with disc height loss at L4-5 and L5-S1. Bilateral facet arthropathy at L4-5 and L5-S1. Articular cartilage and labrum Articular cartilage: Partial-thickness cartilage loss of the left hip with subchondral reactive marrow changes in the superior acetabulum. Labrum:  Small superior anterior left labral tear. Joint or bursal effusion Joint effusion:  No joint effusion. Bursae:  No bursa formation.  Muscles and tendons Flexors: Normal. Extensors: Normal. Abductors: Normal. Adductors: Mild edema and enhancement of the left adductor brevis and longus muscles likely reflecting mild muscle strain versus mild myositis. Rotators: Normal. Hamstrings: Normal. Other findings Miscellaneous: No fluid collection or hematoma. No inguinal lymphadenopathy. No inguinal hernia. IMPRESSION: 1. Mild-moderate osteoarthritis of the left hip. Partial-thickness cartilage loss of the left hip with subchondral reactive marrow changes in the superior acetabulum. 2. Small superior anterior left labral tear. Electronically Signed   By: Kathreen Devoid   On: 04/08/2016 16:37   Ir Lumbar Disc Aspiration W/img Guide  Result Date: 04/08/2016 INDICATION: Severe low back pain secondary to discitis at L3-L4. EXAM: IR DISC ASPIRATION WITH IMAGE GUIDE MEDICATIONS: None. ANESTHESIA/SEDATION: Fentanyl 25 mcg IV; Versed 1 mg IV.  Dilaudid 1 mg IV. Moderate Sedation Time:  15 minutes The patient was continuously monitored during the procedure by the interventional radiology nurse under my direct supervision. COMPLICATIONS: None immediate. PROCEDURE: Informed written consent was obtained from the patient after a thorough discussion of the procedural risks, benefits and alternatives. All questions were addressed. Maximal Sterile Barrier Technique was utilized including caps, mask, sterile gowns, sterile gloves, sterile drape, hand hygiene and skin antiseptic. A timeout was performed prior to the initiation of the procedure. The patient was placed prone on the fluoroscopic table. The skin overlying the lumbar region was then prepped and draped in the usual sterile fashion. The skin entry site to the right of the paraspinous midline at L3-L4 was then infiltrated with 0.25% bupivacaine. Thereafter, two passes were made with a 21 gauge Franseen biopsy needle into the L3-L4 disc space in different locations. With a 20 mL syringe, thick, greenish, bloody  aspirate was obtained for a total of 2 cc. The needles were then removed and hemostasis achieved at the skin entry sites. The specimens were then sent for microbiologic analysis. The patient tolerated the procedure well. She was then returned to her floor in good stable condition. IMPRESSION: Status post fluoroscopic guided needle placement at L3-L4 for aspiration as described for inflammatory discitis. Electronically Signed   By: [name] M.D.   On: 04/05/2016 09:27    Labs:  CBC:  Recent Labs  04/03/16 0519 04/05/16 0445 04/06/16 0342 04/09/16 0420  WBC 5.2 6.2 5.3 3.3*  HGB 9.8* 9.9* 10.0* 9.6*  HCT 30.4* 31.4* 31.2* 29.8*  PLT 168 201 216 228    COAGS:  Recent Labs  09/27/15 1513 04/04/16 0249  INR 0.95 1.03  APTT 31  --     BMP:  Recent Labs  04/04/16 0249 04/05/16 0445 04/06/16 0342 04/09/16  0420  NA 137 136 136 139  K 4.3 4.2 3.6 3.7  CL 109 106 106 105  CO2 21* 24 24 27   GLUCOSE 110* 105* 101* 98  BUN 14 10 7 11   CALCIUM 8.4* 8.2* 8.3* 8.6*  CREATININE 1.05* 0.87 0.89 0.88  GFRNONAA 53* >60 >60 >60  GFRAA >60 >60 >60 >60    LIVER FUNCTION TESTS:  Recent Labs  08/23/15 1034 02/26/16 1017 04/03/16 0519  BILITOT 0.4 0.4 0.3  AST 19 22 52*  ALT 20 17 54  ALKPHOS 66 97 80  PROT 7.2 7.1 6.0*  ALBUMIN 4.0 4.1 3.0*    TUMOR MARKERS: No results for input(s): AFPTM, CEA, CA199, CHROMGRNA in the last 8760 hours.  Assessment and Plan:  Low back pain continues L3-4 disc aspiration neg from 8/17 Abnormal MRI 8/21 Now scheduled for Lumbar bone bx vs epidural mass bx in IR 8/24 Risks and Benefits discussed with the patient including, but not limited to bleeding, infection, damage to adjacent structures or low yield requiring additional tests. All of the patient's questions were answered, patient is agreeable to proceed. Consent signed and in chart.    Thank you for this interesting consult.  I greatly enjoyed meeting [employer] and  look forward to participating in their care.  A copy of this report was sent to the requesting provider on this date.  Electronically Signed: Monia Sabal A 04/10/2016, 4:13 PM   I spent a total of 40 Minutes    in face to face in clinical consultation, greater than 50% of which was counseling/coordinating care for lumbar bone bx vs epidural mass bx

## 2016-04-11 LAB — PROTIME-INR
INR: 0.98
PROTHROMBIN TIME: 12.9 s (ref 11.4–15.2)

## 2016-04-11 LAB — VANCOMYCIN, TROUGH: Vancomycin Tr: 26 ug/mL (ref 15–20)

## 2016-04-11 MED ORDER — VANCOMYCIN HCL IN DEXTROSE 1-5 GM/200ML-% IV SOLN: 750.0000 mg | Freq: Two times a day (BID) | INTRAVENOUS | 0 refills | Status: DC

## 2016-04-11 MED ORDER — LIDOCAINE 5 % EX PTCH
1.0000 | MEDICATED_PATCH | CUTANEOUS | 0 refills | Status: DC
Start: 2016-04-11 — End: 2016-07-09

## 2016-04-11 MED ORDER — ALPRAZOLAM 0.5 MG PO TABS: 0.5000 mg | ORAL_TABLET | Freq: Every evening | ORAL | 0 refills | Status: DC | PRN

## 2016-04-11 MED ORDER — POLYETHYLENE GLYCOL 3350 17 G PO PACK: 17.0000 g | PACK | Freq: Every day | ORAL | 0 refills | Status: DC

## 2016-04-11 MED ORDER — HEPARIN SOD (PORK) LOCK FLUSH 100 UNIT/ML IV SOLN
250.0000 [IU] | INTRAVENOUS | Status: AC | PRN
  Administered 2016-04-11: 250 [IU]

## 2016-04-11 MED ORDER — VANCOMYCIN HCL IN DEXTROSE 750-5 MG/150ML-% IV SOLN: 750.0000 mg | Freq: Two times a day (BID) | INTRAVENOUS | Status: DC

## 2016-04-11 MED ORDER — VANCOMYCIN HCL IN DEXTROSE 750-5 MG/150ML-% IV SOLN
750.0000 mg | Freq: Two times a day (BID) | INTRAVENOUS | Status: DC
  Filled 2016-04-11: qty 150

## 2016-04-11 MED ORDER — MORPHINE SULFATE ER 15 MG PO TBCR
15.0000 mg | EXTENDED_RELEASE_TABLET | Freq: Two times a day (BID) | ORAL | Status: DC
Start: 2016-04-11 — End: 2016-04-12
  Administered 2016-04-11 (×2): 15 mg via ORAL
  Filled 2016-04-11 (×2): qty 1

## 2016-04-11 MED ORDER — DEXTROSE 5 % IV SOLN: 2.0000 g | INTRAVENOUS | 0 refills | Status: DC

## 2016-04-11 MED ORDER — OXYCODONE-ACETAMINOPHEN 5-325 MG PO TABS
2.0000 | ORAL_TABLET | ORAL | Status: DC | PRN
  Administered 2016-04-11 (×2): 2 via ORAL
  Filled 2016-04-11 (×2): qty 2

## 2016-04-11 MED ORDER — DEXTROSE 5 % IV SOLN: 2.0000 g | INTRAVENOUS | Status: DC

## 2016-04-11 MED ORDER — ALPRAZOLAM 0.5 MG PO TABS: 0.5000 mg | ORAL_TABLET | Freq: Three times a day (TID) | ORAL | 0 refills | Status: DC | PRN

## 2016-04-11 MED ORDER — OXYCODONE-ACETAMINOPHEN 5-325 MG PO TABS: 2.0000 | ORAL_TABLET | ORAL | 0 refills | Status: DC | PRN

## 2016-04-11 MED ORDER — VANCOMYCIN HCL IN DEXTROSE 1-5 GM/200ML-% IV SOLN: 1000.0000 mg | Freq: Two times a day (BID) | INTRAVENOUS | 0 refills | Status: DC

## 2016-04-11 MED ORDER — VANCOMYCIN HCL 500 MG IV SOLR
500.0000 mg | Freq: Once | INTRAVENOUS | Status: AC
  Administered 2016-04-11: 500 mg via INTRAVENOUS
  Filled 2016-04-11: qty 500

## 2016-04-11 NOTE — Care Management Note (Signed)
Case Management Note  Patient Details  Name: Sara David MRN: [id number] Date of Birth: [birth year]  Subjective/Objective:       breast cancer, discitis/osteomyelitis of lumbar spine              Action/Plan: Discharge Planning:  NCM spoke to pt. She has shower chair and RW at home. Requesting 3n1 bedside commode. Contacted [facility] for 3n1 bedside commode. [facility] arranged for [facility] RN, PT and IV abx. Scheduled visit 04/12/2016. Pt will have scheduled doses prior to dc.   PCP-MCLAUGHLIN, Wilhemena Durie  MD  Expected Discharge Date:  03/20/2016               Expected Discharge Plan:  [place]  In-House Referral:  NA  Discharge planning Services  CM Consult  Post Acute Care Choice:  Home Health Choice offered to:  Patient  DME Arranged:  3-N-1 DME Agency:  [place] Arranged:  PT, RN, IV Antibiotics HH Agency:  [place]  Status of Service:  Completed, signed off  If discussed at [place] of Stay Meetings, dates discussed:    Additional Comments:  Erenest Rasher, RN 04/11/2016, 3:30 PM

## 2016-04-11 NOTE — Progress Notes (Signed)
Occupational Therapy Treatment Patient Details Name: Sara David MRN: [id number] DOB: 01/05/1946 Today's Date: 04/11/2016    History of present illness pt presents with Low Back Pain with MRI indicating Epidural Infection vs Mets.  pt with hx of Breast CA with mets to R Hip, small intestines, and gall bladder.  pt also with hx of HTN and R THR.     OT comments  Pt making progress with functional goals and reports pain level has decreased significantly. Now recommend that pt return home with [facility]. OT will continue to follow acutely  Follow Up Recommendations  Supervision - Intermittent;Home health OT    Equipment Recommendations  3 in 1 bedside comode    Recommendations for Other Services      Precautions / Restrictions Precautions Precautions: Fall;Back Restrictions Weight Bearing Restrictions: No       Mobility Bed Mobility               General bed mobility comments: pt up with nurse tech upon entering room  Transfers Overall transfer level: Independent Equipment used: Rolling walker (2 wheeled) Transfers: Sit to/from Stand Sit to Stand: Min guard         General transfer comment: using good technique, min guard for safety.     Balance     Sitting balance-Leahy Scale: Good       Standing balance-Leahy Scale: Fair                     ADL       Grooming: Min guard;Wash/dry hands;Wash/dry face;Standing;Brushing hair       Lower Body Bathing: Minimal assistance;Sitting/lateral leans;Sit to/from stand   Upper Body Dressing : Set up;Sitting       Toilet Transfer: RW;Min guard;Ambulation;Comfort height toilet;Grab bars;Cueing for safety   Toileting- Clothing Manipulation and Hygiene: Minimal assistance;Sit to/from stand       Functional mobility during ADLs: Min guard;Cueing for safety        Vision  no change from baseline                              Cognition   Behavior During Therapy: [facility] for tasks  assessed/performed                                                   General Comments  pt very pleasant and cooperative    Pertinent Vitals/ Pain       Pain Score: 4  Pain Location: low back Pain Intervention(s): Monitored during session;Repositioned  Home Living  home with husband                                        Prior Functioning/Environment   independent           Frequency Min 2X/week     Progress Toward Goals  OT Goals(current goals can now be found in the care plan section)  Progress towards OT goals: Progressing toward goals     Plan Discharge plan remains appropriate                     End of Session Equipment Utilized During Treatment: Gait belt;Rolling walker  Activity Tolerance No increased pain   Patient Left in bed;with call bell/phone within reach;with family/visitor present             Time: [id number] OT Time Calculation (min): 33 min  Charges: OT General Charges $OT Visit: 1 Procedure OT Treatments $Self Care/Home Management : 143-157 mins $Therapeutic Activity: 8-22 mins  Britt Bottom 04/11/2016, 1:26 PM

## 2016-04-11 NOTE — Progress Notes (Signed)
Physical Therapy Treatment Patient Details Name: Sara David MRN: [id number] DOB: 1945/12/15 Today's Date: 04/11/2016    History of Present Illness pt presents with Low Back Pain with MRI indicating Epidural Infection vs Mets.  pt with hx of Breast CA with mets to R Hip, small intestines, and gall bladder.  pt also with hx of HTN and R THR.      PT Comments    Pt performed increased gait and reviewed stair training in prep for d/c home.  Pt tolerated stair training well and pain is much improved from previous session.  Will continue efforts as pt remains hospitalized.    Follow Up Recommendations  Home health PT;Supervision for mobility/OOB     Equipment Recommendations  Rolling walker with 5" wheels    Recommendations for Other Services       Precautions / Restrictions Precautions Precautions: Fall;Back Precaution Booklet Issued: No Precaution Comments: reviewed techniques for posture and log roll for back comfort.  No Back precautions ordered.   Restrictions Weight Bearing Restrictions: No    Mobility  Bed Mobility               General bed mobility comments: Pt in recliner on arrival and patient left sitting on edge of bed when treatment ended.    Transfers Overall transfer level: Modified independent Equipment used: Rolling walker (2 wheeled) Transfers: Sit to/from Stand Sit to Stand: Modified independent (Device/Increase time)         General transfer comment: Good technique.    Ambulation/Gait Ambulation/Gait assistance: Supervision Ambulation Distance (Feet): 200 Feet Assistive device: Rolling walker (2 wheeled) Gait Pattern/deviations: Step-through pattern;Trunk flexed Gait velocity: decreased Gait velocity interpretation: Below normal speed for age/gender General Gait Details: Pt remains to require cues for upper trunk control.     Stairs Stairs: Yes Stairs assistance: Min assist Stair Management: No rails;With  walker;Backwards;Forwards Number of Stairs: 6 General stair comments: Cues for sequencing and RW placement.  Instructed patient and husband and couple able to teach back method to PTA.    Wheelchair Mobility    Modified Rankin (Stroke Patients Only)       Balance Overall balance assessment: Needs assistance   Sitting balance-Leahy Scale: Good       Standing balance-Leahy Scale: Fair                      Cognition Arousal/Alertness: Awake/alert Behavior During Therapy: WFL for tasks assessed/performed Overall Cognitive Status: Within Functional Limits for tasks assessed                      Exercises      General Comments        Pertinent Vitals/Pain Pain Assessment: 0-10 Pain Score: 4  Pain Location: Low back Pain Descriptors / Indicators: Discomfort Pain Intervention(s): Monitored during session;Repositioned    Home Living                      Prior Function            PT Goals (current goals can now be found in the care plan section) Acute Rehab PT Goals Patient Stated Goal: know what is going on with her back.  Potential to Achieve Goals: Good Progress towards PT goals: Progressing toward goals    Frequency  Min 3X/week    PT Plan Discharge plan needs to be updated    Co-evaluation  End of Session Equipment Utilized During Treatment: Gait belt Activity Tolerance: No increased pain;Patient limited by fatigue Patient left: in bed;with call bell/phone within reach (sitting edge of bed.  )     Time: [id number] PT Time Calculation (min) (ACUTE ONLY): 27 min  Charges:  $Gait Training: 23-37 mins                    G Codes:      Cristela Blue Apr 15, 2016, 3:34 PM  Governor Rooks, PTA pager [phone]

## 2016-04-11 NOTE — Discharge Summary (Signed)
Physician Discharge Summary  Sara David OIN:[id number] DOB: Feb 03, 1946 DOA: 04/02/2016  PCP: Marinda Elk, MD  Admit date: 04/02/2016 Discharge date: 04/11/2016  Time spent: 45 minutes  Recommendations for Outpatient Follow-up:  1. PCP Dr.McLaughlin in 1 week 2. Dr.Campbell, infectious disease at [facility] in 3-4weeks, needs repeat MRI after Abx completed 3. Continue IV Vancomycin and IV Ceftriaxone till 9/17, Remove Picc line after Abx completed 4. 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 female with a medical history of metastatic breast cancer, hypertension, presented to the emergency department with complaints of back pain that has been worsening since Saturday. Patient states she's had back pain intermittently for the last 3 years however Saturday she began to have very sharp pain located in her lower back radiating to her left buttock. Patient was taking her pain medication without resolution of her symptoms or any improvement in her back pain.   Hospital Course:  Low back pain/  L3-L4 suspected discitis -MRI lumbar spine: new abnormal epidural tissue L3-4 and to the left, ?discitis vs osteomyelitis, had fever of 102.1 day of admission in ED -ESR 60, CRP 9.4, Blood cultures--negative -04/04/16--IR did L3-L4  aspiration/biopsy, -cultures negative and Pathology was negative too -ID Dr.Campbell consulting, Continue Rocephin and vancomycin, PICC placed -due to slow progress, repeat MRI L-spine obtained -04/08/16 MRI L-spine-continued mild progression of mass like ventral epidural enhancement. -We subsequently discussed repeat biopsy and contemplated this, but culture yield would be very low due to Treatment with Abx for the last week and due to risks of repeat biopsy this was aborted. Finally after discussion with IR Dr.Deweshwar, Dr.Campbell and myself, although her lumbar aspirate Gram stain and cultures were negative the fact that her pain came on so abruptly and was so severe combined with her initial fever and good response to empiric antibiotics all support a diagnosis of lumbar infection, plan to treat with vancomycin and ceftriaxone for 5 more weeks through 05/15/2016 -PICC line placed, Home health RN and PT set up for Abx use  Metastatic breast cancer -Follow up with Dr. Grayland Ormond [facility]) -Continue letrozole  Essential Hypertension -Recently taken off of her blood pressure medications one week ago due to hypotension -Blood pressure currently stable  Chronic normocytic anemia -Baseline hemoglobin between 10 and 11 -Continue to monitor CBC  Tobacco abuse -Discussed smoking cessation -Nicotine patch  Hypokalemia -replaced  Constipation -Continue MiraLAX  Hypothyroidism -Continue Synthroid  GERD -ContinuePPI  Acute delirium -acute delirium noted in the evening 8/20, due to multiple medications -now improved   Procedures: IR did L3-L4 aspiration/biopsy on 8/17  Consultations: ID Dr.Campbell  Discharge Exam: Vitals:   04/11/16 0535 04/11/16 1332  BP: (!) 165/74 126/62  Pulse: 62 66  Resp:  16  Temp: 97.8 F (36.6 C) 97.8  F (36.6 C)    General: AAOx3 Cardiovascular: S1S2/RRR Respiratory: CTAB  Discharge Instructions   Discharge Instructions     Diet - low sodium heart healthy    Complete by:  As directed   Increase activity slowly    Complete by:  As directed     Current Discharge Medication List    START taking these medications   Details  ALPRAZolam (XANAX) 0.5 MG tablet Take 1 tablet (0.5 mg total) by mouth at bedtime as needed for sleep. Qty: 20 tablet, Refills: 0    cefTRIAXone 2 g in dextrose 5 % 50 mL Inject 2 g into the vein daily. Till 9/27 Qty: 30 each, Refills: 0    lidocaine (LIDODERM) 5 % Place 1 patch onto the skin daily. Remove & Discard patch within 12 hours or as directed by MD Qty: 30 patch, Refills: 0    oxyCODONE-acetaminophen (PERCOCET/ROXICET) 5-325 MG tablet Take 2 tablets by mouth every 4 (four) hours as needed for moderate pain or severe pain. Qty: 60 tablet, Refills: 0    vancomycin (VANCOCIN) 1-5 GM/200ML-% SOLN Inject 200 mLs (1,000 mg total) into the vein every 12 (twelve) hours. Till 9/27, dose to be titrated by Pharmacy, please check Vanc Trough weekly and Bmet weekly and fax these results to Dr.John Megan Salon at [facility] for Infectious disease at [facility]: 4000 mL, Refills: 0      CONTINUE these medications which have CHANGED   Details  polyethylene glycol (MIRALAX / GLYCOLAX) packet Take 17 g by mouth daily. Qty: 14 each, Refills: 0      CONTINUE these medications which have NOT CHANGED   Details  acetaminophen (TYLENOL) 500 MG tablet Take 1,000 mg by mouth daily as needed for fever.     cetirizine (ZYRTEC) 10 MG tablet Take 10 mg by mouth daily as needed for allergies. Reported on 10/03/2015    citalopram (CELEXA) 20 MG tablet Take 20 mg by mouth every morning.     Fluticasone-Salmeterol (ADVAIR) 100-50 MCG/DOSE AEPB Inhale 1 puff into the lungs every morning.     letrozole (FEMARA) 2.5 MG tablet TAKE 1 TABLET ONE TIME DAILY Qty: 90 tablet, Refills: 4    levothyroxine (SYNTHROID, LEVOTHROID) 100 MCG tablet Take 100 mcg by mouth daily before breakfast.      montelukast (SINGULAIR) 10 MG tablet Take 10 mg by mouth at bedtime.     Multiple Vitamins-Minerals (CENTRUM SILVER PO) Take 1 tablet by mouth every morning.    omeprazole (PRILOSEC) 20 MG capsule Take 20 mg by mouth every morning.     pravastatin (PRAVACHOL) 40 MG tablet Take 40 mg by mouth at bedtime.     zolpidem (AMBIEN) 10 MG tablet Take 1 tablet (10 mg total) by mouth at bedtime as needed. for sleep Qty: 30 tablet, Refills: 0   Associated Diagnoses: Cancer ([place])      STOP taking these medications     HYDROcodone-acetaminophen (NORCO/VICODIN) 5-325 MG tablet        Allergies  Allergen Reactions  . Carafate [Sucralfate] Nausea Only  . Diclofenac Sodium Nausea Only  . Erythromycin Diarrhea and Nausea And Vomiting  . Adhesive [Tape] Itching and Rash  . Wellbutrin [Bupropion] Itching and Rash   Follow-up Information    MCLAUGHLIN, MIRIAM K, MD. Schedule an appointment as soon as possible for a visit in 1 week(s).   Specialty:  Physician Assistant Contact information: [facility] [place] [address] [phone]  Michel Bickers, MD. Schedule an appointment as soon as possible for a visit in 4 week(s).   Specialty:  Infectious Diseases Contact information: [address] [place] [address] [phone]            The results of significant diagnostics from this hospitalization (including imaging, microbiology, ancillary and laboratory) are listed below for reference.    Significant Diagnostic Studies: Mr Lumbar Spine W Wo Contrast  Result Date: 04/08/2016 CLINICAL DATA:  69 year old female with back and left hip pain, not improving. Metastatic breast cancer. Abnormal enhancement in the lumbar spine, progressed at L3-L4 on a comparison 6 days ago. Status post fluoroscopic guided L3-L4 disc aspiration 4 days ago, lab analysis revealed abundant white cells present but no organisms or growth on culture.  Subsequent encounter. EXAM: MRI LUMBAR SPINE WITHOUT AND WITH CONTRAST TECHNIQUE: Multiplanar and multiecho pulse sequences of the lumbar spine were obtained without and with intravenous contrast. CONTRAST:  15 mL MultiHance COMPARISON:  Lumbar MRI 04/02/2016 and earlier FINDINGS: Segmentation:  Normal, same numbering system as on prior studies. Alignment:  Stable vertebral height and alignment. Vertebrae: Unchanged confluent endplate marrow edema and enhancement about the L3-L4 disc. Interval new STIR hyperintensity within that disc space (series 3, image 7), but continued absent enhancement. Stable bone marrow signal elsewhere. Conus medullaris: Extends to the T12-L1 level and appears normal no abnormal intradural enhancement. No thickening of the cauda equina nerve roots. Paraspinal and other soft tissues: New small volume of free fluid adjacent to the inferior right hepatic lobe. Small volume of pelvic free fluid identified on sagittal images, new. Otherwise stable abdominal viscera. A ectatic infrarenal abdominal aorta again noted, up to 25 mm diameter. Disc levels: Stable disc spaces L2-L3 and above. L3-L4: Persistent and perhaps mildly increased broad-based ventral epidural enhancement with mass effect on the thecal sac (series 11, image 26). This extends out into the left L3 neural foramen as before. Subsequent multifactorial severe left L3 foraminal stenosis. The L4-L5 and lower disc spaces are stable. IMPRESSION: 1. Continued and perhaps mild progression of masslike ventral epidural enhancement at L3-L4 which extends into the left L3 neural foramen. Query left L3 radiculitis. Disc aspiration/biopsy microbiology results were negative aside from WBCs. Recommend correlation with pathology results from that needle biopsy which are pending. 2. Interval increased STIR signal in the L3-L4 disc space which may be postprocedural in nature. Stable endplate edema and enhancement without endplate destruction. 3. Other  visualized spinal levels are stable since 04/02/2016. 4. New free fluid in the pelvis, and to a lesser extent in the right abdomen nonspecific. 5. Ectatic abdominal aorta at risk for aneurysm development. Recommend followup by ultrasound in 5 years. This recommendation follows ACR consensus guidelines: White Paper of the ACR Incidental Findings Committee II on Vascular Findings. J Am Coll Radiol 2013; 10:789-794. Electronically Signed   By: Genevie Ann M.D.   On: 04/08/2016 16:00   Mr Lumbar Spine W Wo Contrast  Addendum Date: 04/03/2016   ADDENDUM REPORT: 04/03/2016 10:48 ADDENDUM: The original report was by Dr. Van Clines. The following addendum is by Dr. Van Clines: A 1.1 by 0.7 cm T1 precontrast hypointense lesion anteriorly in the right S1 vertebral body is new compared to the prior lumbar spine MRI and could represent an early bony metastatic lesion in this patient with history of widespread bony metastatic disease. These addendum will be called to the covering inpatient team by the Radiologist Assistant, and communication documented in the PACS or zVision  Dashboard. Electronically Signed   By: Van Clines M.D.   On: 04/03/2016 10:48   Result Date: 04/03/2016 CLINICAL DATA:  Low back pain.  Various forms of cancer in the past. EXAM: MRI LUMBAR SPINE WITHOUT AND WITH CONTRAST TECHNIQUE: Multiplanar and multiecho pulse sequences of the lumbar spine were obtained without and with intravenous contrast. CONTRAST:  42m MULTIHANCE GADOBENATE DIMEGLUMINE 529 MG/ML IV SOLN COMPARISON:  09/01/2015 FINDINGS: Segmentation: The lowest lumbar type non-rib-bearing vertebra is labeled as L5. Alignment:  3 mm degenerative retrolisthesis at L4-5. Vertebrae: Type 3 degenerative endplate findings at LO3-5and L4-5, with enhancement along the left eccentric L3-4 degenerative endplate findings. Schmorl's node along the inferior endplate of L3, increased from prior. Previously anterior enhancing lesion at L1  no longer appreciated. Previous enhancing lesion along the superior endplate of TK09no longer seen. Conus medullaris: Extends to the L1 level and appears normal. Paraspinal and other soft tissues: Unremarkable Disc levels: L1-2: No impingement, right facet arthropathy and focal ligamentum flavum redundancy. L2-3: Moderate central narrowing of the thecal sac with mild left foraminal stenosis and mild bilateral subarticular lateral recess stenosis due to disc bulge, left paracentral disc protrusion extending caudad, and facet arthropathy. Similar appearance to prior. L3-4: The patient has had a remote prior left laminectomy at this level. However, there is new abnormal enhancing epidural tissue at this level extending nearly circumferentially but especially anteriorly and eccentric to the left, tracking from the top of the L3 vertebral body nearly to the bottom of the L4 vertebral body in the left lateral recess. This tissue appears to be diffusely enhancing and extends into the left L3-4 neural foramen. Prominent left foraminal stenosis and moderate central narrowing of the thecal sac are present. The left L4 nerve roots course through some of this enhancing tissue in the lateral recess. L4-5: There is accentuated enhancement in the left neural foramen due to the epidural process extending into the left neural foramen. Moderate right and mild left foraminal stenosis. Moderate central narrowing of the thecal sac and mild bilateral subarticular lateral recess stenosis. L5-S1: Mild bilateral foraminal stenosis primarily from facet spurring, stable. IMPRESSION: 1. New abnormal epidural tissue enhancement centered at L3-4 and to the left, and extending both cephalad and caudad, possibly from tumor or epidural phlegmon/infection. The patient does have a left laminectomy at this level, but had this before without the epidural enhancing tissue, and accordingly epidural fibrosis as an explanation for this enhancing tissue  seems unlikely. There is some worsening of the endplate findings at LF8-1 especially eccentric to the left, with considerable endplate enhancement, although without high T2 signal and enhancement in the intervertebral disc to further favor discitis -osteomyelitis. Enhancement associated with this process extends into the left neural foramina at L3-4 and L4-5. Correlate with any fever/leukocytosis. In conjunction with the spondylosis and degenerative disc disease at this level, there is prominent left foraminal stenosis and moderate central narrowing of the thecal sac at L3-4. 2. Lumbar spondylosis and degenerative disc disease also causes moderate impingement at L2-3 and L4-5, and mild impingement at L5-S1. These results will be called to the ordering clinician or representative by the Radiologist Assistant, and communication documented in the PACS or zVision Dashboard. Electronically Signed: By: WVan ClinesM.D. On: 04/02/2016 16:12   Mr Hip Left W Wo Contrast  Result Date: 04/08/2016 CLINICAL DATA:  Left hip pain.  Back pain. EXAM: MRI OF THE LEFT HIP WITHOUT AND WITH CONTRAST TECHNIQUE: Multiplanar, multisequence MR imaging was performed both  before and after administration of intravenous contrast. CONTRAST:  44m MULTIHANCE GADOBENATE DIMEGLUMINE 529 MG/ML IV SOLN COMPARISON:  None. FINDINGS: The patient was unable to complete the entirety of the examination secondary to severe pain. Coronal T1 fat saturated postcontrast images could not be obtained. Bones: Right total hip arthroplasty with susceptibility artifacts the adjacent soft tissue and osseous structures. No left hip fracture, dislocation or avascular necrosis. No aggressive osseous lesion. No periosteal reaction or bone destruction. Normal sacrum and sacroiliac joints. Degenerative disc disease with disc height loss at L4-5 and L5-S1. Bilateral facet arthropathy at L4-5 and L5-S1. Articular cartilage and labrum Articular cartilage:  Partial-thickness cartilage loss of the left hip with subchondral reactive marrow changes in the superior acetabulum. Labrum:  Small superior anterior left labral tear. Joint or bursal effusion Joint effusion:  No joint effusion. Bursae:  No bursa formation. Muscles and tendons Flexors: Normal. Extensors: Normal. Abductors: Normal. Adductors: Mild edema and enhancement of the left adductor brevis and longus muscles likely reflecting mild muscle strain versus mild myositis. Rotators: Normal. Hamstrings: Normal. Other findings Miscellaneous: No fluid collection or hematoma. No inguinal lymphadenopathy. No inguinal hernia. IMPRESSION: 1. Mild-moderate osteoarthritis of the left hip. Partial-thickness cartilage loss of the left hip with subchondral reactive marrow changes in the superior acetabulum. 2. Small superior anterior left labral tear. Electronically Signed   By: HKathreen Devoid  On: 04/08/2016 16:37   Ir Lumbar Disc Aspiration W/img Guide  Result Date: 04/08/2016 INDICATION: Severe low back pain secondary to discitis at L3-L4. EXAM: IR DISC ASPIRATION WITH IMAGE GUIDE MEDICATIONS: None. ANESTHESIA/SEDATION: Fentanyl 25 mcg IV; Versed 1 mg IV.  Dilaudid 1 mg IV. Moderate Sedation Time:  15 minutes The patient was continuously monitored during the procedure by the interventional radiology nurse under my direct supervision. COMPLICATIONS: None immediate. PROCEDURE: Informed written consent was obtained from the patient after a thorough discussion of the procedural risks, benefits and alternatives. All questions were addressed. Maximal Sterile Barrier Technique was utilized including caps, mask, sterile gowns, sterile gloves, sterile drape, hand hygiene and skin antiseptic. A timeout was performed prior to the initiation of the procedure. The patient was placed prone on the fluoroscopic table. The skin overlying the lumbar region was then prepped and draped in the usual sterile fashion. The skin entry site to the  right of the paraspinous midline at L3-L4 was then infiltrated with 0.25% bupivacaine. Thereafter, two passes were made with a 21 gauge Franseen biopsy needle into the L3-L4 disc space in different locations. With a 20 mL syringe, thick, greenish, bloody aspirate was obtained for a total of 2 cc. The needles were then removed and hemostasis achieved at the skin entry sites. The specimens were then sent for microbiologic analysis. The patient tolerated the procedure well. She was then returned to her floor in good stable condition. IMPRESSION: Status post fluoroscopic guided needle placement at L3-L4 for aspiration as described for inflammatory discitis. Electronically Signed   By: SLuanne BrasM.D.   On: 04/05/2016 09:27    Microbiology: Recent Results (from the past 240 hour(s))  Blood culture (routine x 2)     Status: None   Collection Time: 04/02/16  4:54 PM  Result Value Ref Range Status   Specimen Description BLOOD RIGHT HAND  Final   Special Requests BOTTLES DRAWN AEROBIC AND ANAEROBIC 5CC  Final   Culture NO GROWTH 5 DAYS  Final   Report Status 04/07/2016 FINAL  Final  Blood culture (routine x 2)  Status: None   Collection Time: 04/02/16  5:03 PM  Result Value Ref Range Status   Specimen Description BLOOD LEFT HAND  Final   Special Requests BOTTLES DRAWN AEROBIC AND ANAEROBIC 5CC  Final   Culture NO GROWTH 5 DAYS  Final   Report Status 04/07/2016 FINAL  Final  Body fluid culture     Status: None   Collection Time: 04/04/16  4:08 PM  Result Value Ref Range Status   Specimen Description NEEDLE ASPIRATE  Final   Special Requests L3 TO L4 DISC SPACE  Final   Gram Stain   Final    ABUNDANT WBC PRESENT, PREDOMINANTLY PMN NO ORGANISMS SEEN    Culture NO GROWTH 3 DAYS  Final   Report Status 04/08/2016 FINAL  Final     Labs: Basic Metabolic Panel:  Recent Labs Lab 04/05/16 0445 04/06/16 0342 04/09/16 0420  NA 136 136 139  K 4.2 3.6 3.7  CL 106 106 105  CO2 _0 GLUCOSE 105* 101* 98  BUN _1 CREATININE 0.87 0.89 0.88  CALCIUM 8.2* 8.3* 8.6*   Liver Function Tests: No results for input(s): AST, ALT, ALKPHOS, BILITOT, PROT, ALBUMIN in the last 168 hours. No results for input(s): LIPASE, AMYLASE in the last 168 hours. No results for input(s): AMMONIA in the last 168 hours. CBC:  Recent Labs Lab 04/05/16 0445 04/06/16 0342 04/09/16 0420  WBC 6.2 5.3 3.3*  HGB 9.9* 10.0* 9.6*  HCT 31.4* 31.2* 29.8*  MCV 97.2 95.4 94.6  PLT 201 216 228   Cardiac Enzymes: No results for input(s): CKTOTAL, CKMB, CKMBINDEX, TROPONINI in the last 168 hours. BNP: BNP (last 3 results) No results for input(s): BNP in the last 8760 hours.  ProBNP (last 3 results) No results for input(s): PROBNP in the last 8760 hours.  CBG: No results for input(s): GLUCAP in the last 168 hours.     SignedDomenic Polite MD.  [facility] 04/11/2016, 2:45 PM

## 2016-04-11 NOTE — Progress Notes (Signed)
Pharmacy Antibiotic Note  Sara David is a 70 y.o. female admitted on 04/02/2016 with lumbar epidural infection.  Pharmacy consulted for Vancomycin dosing.   Now day #8 of abx for Lumbar epidural infection. WBC wnl. Afeb. ID on board. ID rec treat for cx neg epidural infection. SCr stable on 8/22 at 0.88, CrCl ~43ml/min. UOP 1900 ml yesterday. Plan is to continue IV Vanc/CTX x 5wks per ID. Patient has arrangements for [facility] & IV abx. PICC placed previously.  Plan: Decrease vancomycin to 500 mg x 1 tonight, wait until 20:30 tonight to give this dose to allow vanc level to decrease closer to goal prior to patient's discharge tonight.  Then tomorrow morning begin 750 mg IV Q12h per home health care.  Continue CTX 2g IV Q24 Monitor clinical picture, renal function   Height: 5\' 4"  (162.6 cm) Weight: 160 lb (72.6 kg) IBW/kg (Calculated) : 54.7  Temp (24hrs), Avg:98 F (36.7 C), Min:97.8 F (36.6 C), Max:98.4 F (36.9 C)   Recent Labs Lab 04/05/16 0445 04/06/16 0342 04/07/16 0416 04/09/16 0420 04/11/16 1630  WBC 6.2 5.3  --  3.3*  --   CREATININE 0.87 0.89  --  0.88  --   VANCOTROUGH  --   --  14*  --  26*    Estimated Creatinine Clearance: 58.1 mL/min (by C-G formula based on SCr of 0.88 mg/dL).    Allergies  Allergen Reactions  . Carafate [Sucralfate] Nausea Only  . Diclofenac Sodium Nausea Only  . Erythromycin Diarrhea and Nausea And Vomiting  . Adhesive [Tape] Itching and Rash  . Wellbutrin [Bupropion] Itching and Rash    Thank you for allowing pharmacy to be a part of this patient's care.  Nicole Cella, RPh Clinical Pharmacist Pager: [phone] 04/11/2016 6:09 PM

## 2016-04-18 ENCOUNTER — Telehealth: Payer: Self-pay | Admitting: *Deleted

## 2016-04-18 NOTE — Telephone Encounter (Signed)
Patient called to ask if she could get an injection from her Pain clinic doctor today while on IV medication. When asked what medication she will get she was unsure. Advised her we would need to know what the medicine is in order to say if she could have it. Advised her the pain doctor should know as well but advised if she has questions to find out and call us back.

## 2016-04-19 ENCOUNTER — Other Ambulatory Visit: Payer: Self-pay | Admitting: *Deleted

## 2016-04-19 NOTE — Telephone Encounter (Signed)
His was given to her by hospitalist and she was told by them to have you or her other doctor refill it for her. Are you OK with that? If so, I can ask Dr Mike Gip to sign it. Per Dr Grayland Ormond, this is not cancer related and he is uncomfortable prescribing med for something he is not treating, he said if she has problems getting it filled he would give her a 2 week supply until she can get someone to fill it. I advised patient of this and she will contact her PCP or Ortho doctor to have it refilled.

## 2016-04-25 ENCOUNTER — Other Ambulatory Visit: Payer: Self-pay | Admitting: *Deleted

## 2016-04-25 DIAGNOSIS — C801 Malignant (primary) neoplasm, unspecified: Secondary | ICD-10-CM

## 2016-04-25 MED ORDER — ZOLPIDEM TARTRATE 10 MG PO TABS: 10.0000 mg | ORAL_TABLET | Freq: Every evening | ORAL | 0 refills | Status: DC | PRN

## 2016-05-08 ENCOUNTER — Other Ambulatory Visit: Payer: Self-pay | Admitting: Oncology

## 2016-05-09 ENCOUNTER — Encounter: Payer: Self-pay | Admitting: Internal Medicine

## 2016-05-09 ENCOUNTER — Ambulatory Visit (INDEPENDENT_AMBULATORY_CARE_PROVIDER_SITE_OTHER): Payer: Medicare Other | Admitting: Internal Medicine

## 2016-05-09 DIAGNOSIS — M545 Low back pain, unspecified: Secondary | ICD-10-CM

## 2016-05-09 DIAGNOSIS — Z23 Encounter for immunization: Secondary | ICD-10-CM | POA: Diagnosis present

## 2016-05-09 MED ORDER — DEXTROSE 5 % IV SOLN: 2.0000 g | INTRAVENOUS | 0 refills | Status: AC

## 2016-05-09 MED ORDER — VANCOMYCIN HCL IN DEXTROSE 1-5 GM/200ML-% IV SOLN: 750.0000 mg | Freq: Two times a day (BID) | INTRAVENOUS | 0 refills | Status: AC

## 2016-05-09 NOTE — Assessment & Plan Note (Signed)
I strongly suspect that her acute low back pain and epidural enhancement is due to lumbar infection. She presented to the hospital with fever. She defervesced and her pain started to improve after antibiotics were started. She continues to improve over 5 weeks into therapy. Her sedimentation rate and C-reactive protein were both elevated when she was in the hospital and have now normalized. I talked to her about uncertainty over optimal duration of therapy. The only way to be absolutely sure that she is curious to ultimately stop antibiotics and wait several months to see if she shows signs of relapse. She has requested to stop after 6 full weeks of therapy on 05/15/2016. Her PICC line will be removed at that time. She will follow-up with me in 6 weeks.

## 2016-05-09 NOTE — Progress Notes (Signed)
[place] for Infectious Disease  Patient Active Problem List   Diagnosis Date Noted  . Fever 04/04/2016    Priority: High  . Acute low back pain 04/02/2016    Priority: High  . Discitis of lumbar region   . Asthma without status asthmaticus 10/17/2015  . Malignant neoplasm of upper-outer quadrant of right female breast ([facility]) 10/17/2015  . Carpal tunnel syndrome 10/17/2015  . Cervical disc disease 10/17/2015  . Cervical pain 10/17/2015  . Benign essential HTN 10/17/2015  . Acid reflux 10/17/2015  . Combined fat and carbohydrate induced hyperlipemia 10/17/2015  . Adult hypothyroidism 10/17/2015  . Adaptive colitis 10/17/2015  . Primary osteoarthritis of right hip 10/03/2015  . Small bowel obstruction ([place]) 01/30/2015  . B12 deficiency 01/30/2015  . Gastroesophageal reflux disease 01/30/2015  . IBS (irritable bowel syndrome) 01/30/2015  . Asthma 01/30/2015  . Hypothyroidism 01/30/2015  . FH: cholecystectomy 01/30/2015  . Breast cancer metastasized to multiple sites ([place]) 01/30/2015  . Bloodgood disease 05/30/2014  . Post menopausal syndrome 05/30/2014  . Raynaud's syndrome without gangrene 05/30/2014  . Chronic bronchitis ([place]) 01/26/2014    Patient's Medications  New Prescriptions   No medications on file  Previous Medications   ACETAMINOPHEN (TYLENOL) 500 MG TABLET    Take 1,000 mg by mouth daily as needed for fever.    ALPRAZOLAM (XANAX) 0.5 MG TABLET    Take 1 tablet (0.5 mg total) by mouth at bedtime as needed for sleep.   CETIRIZINE (ZYRTEC) 10 MG TABLET    Take 10 mg by mouth daily as needed for allergies. Reported on 10/03/2015   CITALOPRAM (CELEXA) 20 MG TABLET    Take 20 mg by mouth every morning.    FLUTICASONE-SALMETEROL (ADVAIR) 100-50 MCG/DOSE AEPB    Inhale 1 puff into the lungs every morning.    LETROZOLE (FEMARA) 2.5 MG TABLET    TAKE 1 TABLET ONE TIME DAILY   LEVOTHYROXINE (SYNTHROID, LEVOTHROID) 100 MCG TABLET    Take 100 mcg by mouth  daily before breakfast.    LIDOCAINE (LIDODERM) 5 %    Place 1 patch onto the skin daily. Remove & Discard patch within 12 hours or as directed by MD   MONTELUKAST (SINGULAIR) 10 MG TABLET    Take 10 mg by mouth at bedtime.    MULTIPLE VITAMINS-MINERALS (CENTRUM SILVER PO)    Take 1 tablet by mouth every morning.   OMEPRAZOLE (PRILOSEC) 20 MG CAPSULE    Take 20 mg by mouth every morning.    OXYCODONE-ACETAMINOPHEN (PERCOCET/ROXICET) 5-325 MG TABLET    Take 2 tablets by mouth every 4 (four) hours as needed for moderate pain or severe pain.   POLYETHYLENE GLYCOL (MIRALAX / GLYCOLAX) PACKET    Take 17 g by mouth daily.   PRAVASTATIN (PRAVACHOL) 40 MG TABLET    Take 40 mg by mouth at bedtime.    ZOLPIDEM (AMBIEN) 10 MG TABLET    Take 1 tablet (10 mg total) by mouth at bedtime as needed. for sleep  Modified Medications   Modified Medication Previous Medication   CEFTRIAXONE 2 G IN DEXTROSE 5 % 50 ML cefTRIAXone 2 g in dextrose 5 % 50 mL      Inject 2 g into the vein daily. Till 9/27    Inject 2 g into the vein daily. Till 9/27   VANCOMYCIN (VANCOCIN) 1-5 GM/200ML-% SOLN vancomycin (VANCOCIN) 1-5 GM/200ML-% SOLN      Inject 150 mLs (750 mg  total) into the vein every 12 (twelve) hours. Till 9/27, dose to be titrated by Pharmacy, please check Vanc Trough weekly and Bmet weekly and fax these results to Dr.Kellon Chalk Megan Salon at [facility] for Infectious disease at [facility]    Inject 150 mLs (750 mg total) into the vein every 12 (twelve) hours. Till 9/27, dose to be titrated by Pharmacy, please check Vanc Trough weekly and Bmet weekly and fax these results to Dr.Aaliyah Cancro Megan Salon at [facility] for Infectious disease at [place]  Discontinued Medications   No medications on file    Subjective: Sara David is in for her hospital follow-up visit. She is a 70 y.o. female with history of degenerative spine disease and metastatic breast cancer who developed acute on chronic low back  pain 6 weeks ago after trying to get her 70 year old, demented mother out of the bathtub. The pain was "20" out of 10 and radiated into her left buttock and down her left leg leading her to present to be admitted where she was found to be febrile with a temperature of 102. MRI showed new epidural enhancement at L3-4. Admission blood cultures were negative. Her sedimentation rate and C-reactive protein are elevated. Lumbar aspirate Gram stain showed abundant white blood cells but no organisms. Aspirate cultures were negative. I started her on empiric IV vancomycin and ceftriaxone and she improved over one week in the hospital. She defervesced and her back pain started to decrease. She is now completed 36 days of total antibiotic therapy. She is still having pain but now rates it as 4 out of 10. She has been able to decrease her pain medication from every 4 hours to every 6 hours while awake. She has been able to be more active over the past several weeks. She has had more frequent soft bowel movements but no diarrhea. She's had no more fever, chills or sweats. She's had no problems tolerating her PICC or antibiotics.  Review of Systems: Review of Systems  Constitutional: Positive for malaise/fatigue. Negative for chills, diaphoresis, fever and weight loss.  HENT: Negative for sore throat.   Respiratory: Negative for cough, sputum production and shortness of breath.   Cardiovascular: Negative for chest pain.  Gastrointestinal: Negative for abdominal pain, diarrhea, nausea and vomiting.  Genitourinary: Negative for dysuria.  Musculoskeletal: Positive for back pain and joint pain. Negative for myalgias.  Skin: Negative for rash.  Neurological: Negative for dizziness, focal weakness and headaches.    Past Medical History:  Diagnosis Date  . Arthritis    "back" (04/03/2016)  . Breast cancer ([place]) 2012    no lumpectomy. Treating with meds  . Cancer ([place])    small intestines 2012, gallbladder 2016  .  Chronic lower back pain   . COPD (chronic obstructive pulmonary disease) ([place])    "use inhalers for COPD that dx'd at one time" (04/03/2016)  . Family history of adverse reaction to anesthesia    daughter gets nauseated  . GERD (gastroesophageal reflux disease)   . High cholesterol   . Hypertension   . Hypothyroidism   . Skin cancer    "right neck; mid forehead"    Social History  Substance Use Topics  . Smoking status: Current Every Day Smoker    Packs/day: 0.75    Years: 54.00    Types: Cigarettes  . Smokeless tobacco: Never Used  . Alcohol use No    Family History  Problem Relation Age of Onset  . Lung cancer Brother 80  .  Heart disease Father   . Diabetes Father     Allergies  Allergen Reactions  . Carafate [Sucralfate] Nausea Only  . Diclofenac Sodium Nausea Only  . Erythromycin Diarrhea and Nausea And Vomiting  . Adhesive [Tape] Itching and Rash  . Wellbutrin [Bupropion] Itching and Rash    Objective: Vitals:   05/09/16 1014  BP: (!) 165/78  Pulse: 66  Temp: 98.3 F (36.8 C)  TempSrc: Oral  Weight: 148 lb 8 oz (67.4 kg)   Body mass index is 25.49 kg/m.  Physical Exam  Constitutional: She is oriented to person, place, and time.  She is accompanied by a friend. She looks much better than when I last saw her in the hospital.  Cardiovascular: Normal rate and regular rhythm.   No murmur heard. Pulmonary/Chest: Effort normal and breath sounds normal.  Abdominal: Soft. There is no tenderness.  Musculoskeletal:  There is no swelling, redness or warmth over her lower back or left hip.  Neurological: She is alert and oriented to person, place, and time.  Skin: No rash noted.  Left arm PICC site looks good.  Psychiatric: Mood and affect normal.    Lab Results    Problem List Items Addressed This Visit      High   Acute low back pain    I strongly suspect that her acute low back pain and epidural enhancement is due to lumbar infection. She presented  to the hospital with fever. She defervesced and her pain started to improve after antibiotics were started. She continues to improve over 5 weeks into therapy. Her sedimentation rate and C-reactive protein were both elevated when she was in the hospital and have now normalized. I talked to her about uncertainty over optimal duration of therapy. The only way to be absolutely sure that she is curious to ultimately stop antibiotics and wait several months to see if she shows signs of relapse. She has requested to stop after 6 full weeks of therapy on 05/15/2016. Her PICC line will be removed at that time. She will follow-up with me in 6 weeks.       Other Visit Diagnoses    Encounter for immunization       Relevant Orders   Flu Vaccine QUAD 36+ mos IM (Completed)       Michel Bickers, MD [facility] for [facility] [phone] pager   [phone] cell 05/09/2016, 2:35 PM

## 2016-05-09 NOTE — Progress Notes (Signed)
Verbal order from Dr. Michel Bickers to stop IV ABX 05/15/16 and Pull Picc at that time. Phone Call to [place] - Stop IV ABX on 05/15/16 and pull PICC.  Read back by Stanton Kidney,  RN at [place] [facility].

## 2016-05-23 ENCOUNTER — Other Ambulatory Visit: Payer: Self-pay | Admitting: Physician Assistant

## 2016-05-23 DIAGNOSIS — N6310 Unspecified lump in the right breast, unspecified quadrant: Secondary | ICD-10-CM

## 2016-05-24 ENCOUNTER — Other Ambulatory Visit: Payer: Self-pay | Admitting: Physician Assistant

## 2016-05-24 DIAGNOSIS — N6310 Unspecified lump in the right breast, unspecified quadrant: Secondary | ICD-10-CM

## 2016-05-27 ENCOUNTER — Other Ambulatory Visit: Payer: Self-pay | Admitting: *Deleted

## 2016-05-27 DIAGNOSIS — C801 Malignant (primary) neoplasm, unspecified: Secondary | ICD-10-CM

## 2016-05-27 MED ORDER — ZOLPIDEM TARTRATE 10 MG PO TABS: 10.0000 mg | ORAL_TABLET | Freq: Every evening | ORAL | 0 refills | Status: DC | PRN

## 2016-05-29 NOTE — Progress Notes (Signed)
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  INTERVAL HISTORY: Patient returns to clinic today for routine 3 month follow-up. She continues to have significant back pain and left hip and leg pain. She had a right hip replacement earlier this year. She otherwise feels well.  She has no neurologic complaints.  She denies any chest pain or shortness of breath. She has a fair appetite and denies weight loss. She denies vomiting or constipation.  She has no melena or hematochezia.  She has no urinary complaints.  Patient offers no further specific complaints today.  REVIEW OF SYSTEMS:   Review of Systems  Constitutional: Positive for malaise/fatigue. Negative for fever and weight loss.  Respiratory: Negative.  Negative for cough and shortness of breath.   Cardiovascular: Negative.  Negative for chest pain.  Gastrointestinal: Negative for abdominal pain, blood in stool and melena.  Genitourinary: Negative.   Musculoskeletal: Positive for back pain and joint pain.  Neurological: Positive for weakness.  Psychiatric/Behavioral: Negative.     As per HPI. Otherwise, a complete review of systems is negative.  PAST MEDICAL HISTORY: Past Medical History:  Diagnosis Date  . Arthritis    "back" (04/03/2016)  . Breast cancer ([place]) 2012    no lumpectomy. Treating with meds  . Cancer ([place])    small intestines 2012, gallbladder 2016  . Chronic lower back pain   . COPD (chronic obstructive pulmonary disease) ([place])    "use inhalers for COPD that dx'd at one time" (04/03/2016)  . Family history of adverse reaction to anesthesia    daughter gets nauseated  . GERD (gastroesophageal reflux  disease)   . High cholesterol   . Hypertension   . Hypothyroidism   . Skin cancer    "right neck; mid forehead"    PAST SURGICAL HISTORY: Past Surgical History:  Procedure Laterality Date  . ABDOMINAL HERNIA REPAIR     "after they took tumor out of my colon I developed a hernia"  . ABDOMINAL HYSTERECTOMY    . BACK SURGERY    . BREAST BIOPSY Right 2012   positive  . BREAST BIOPSY Right 2014   positive  . BREAST EXCISIONAL BIOPSY Right 1995   benign  . CARPAL TUNNEL RELEASE Left   . DILATION AND CURETTAGE OF UTERUS     S/P miscarriage  . IR GENERIC HISTORICAL  04/04/2016   IR LUMBAR DISC ASPIRATION W/IMG GUIDE 04/04/2016 [name], MD [facility]-INTERV RAD  . JOINT REPLACEMENT    . LAPAROSCOPIC CHOLECYSTECTOMY  2016  . SKIN CANCER EXCISION     "right neck; mid forehead"  . TOTAL HIP ARTHROPLASTY Right 10/03/2015   Procedure: TOTAL HIP ARTHROPLASTY ANTERIOR APPROACH;  Surgeon: Hessie Knows, MD;  Location: [facility] ORS;  Service: Orthopedics;  Laterality: Right;  . TUBAL LIGATION    . TUMOR EXCISION  2012   "small intestine; came from the breast cancer"    FAMILY HISTORY: Reviewed and unchanged. No reported history of breast cancer or other chronic diseases.     ADVANCED DIRECTIVES:    HEALTH MAINTENANCE: Social History  Substance Use Topics  . Smoking status: Current Every Day Smoker    Packs/day: 0.75    Years: 54.00    Types:  Cigarettes  . Smokeless tobacco: Never Used  . Alcohol use No     Colonoscopy:  PAP:  Bone density:  Lipid panel:  Allergies  Allergen Reactions  . Carafate [Sucralfate] Nausea Only  . Diclofenac Sodium Nausea Only  . Erythromycin Diarrhea and Nausea And Vomiting  . Adhesive [Tape] Itching and Rash  . Wellbutrin [Bupropion] Itching and Rash    Current Outpatient Prescriptions  Medication Sig Dispense Refill  . acetaminophen (TYLENOL) 500 MG tablet Take 1,000 mg by mouth daily as needed for fever.     . ALPRAZolam (XANAX) 0.5 MG  tablet Take 1 tablet (0.5 mg total) by mouth at bedtime as needed for sleep. 20 tablet 0  . cetirizine (ZYRTEC) 10 MG tablet Take 10 mg by mouth daily as needed for allergies. Reported on 10/03/2015    . citalopram (CELEXA) 20 MG tablet Take 20 mg by mouth every morning.     . Fluticasone-Salmeterol (ADVAIR) 100-50 MCG/DOSE AEPB Inhale 1 puff into the lungs every morning.     Marland Kitchen letrozole (FEMARA) 2.5 MG tablet TAKE 1 TABLET ONE TIME DAILY 90 tablet 4  . levothyroxine (SYNTHROID, LEVOTHROID) 100 MCG tablet Take 100 mcg by mouth daily before breakfast.     . lidocaine (LIDODERM) 5 % Place 1 patch onto the skin daily. Remove & Discard patch within 12 hours or as directed by MD 30 patch 0  . montelukast (SINGULAIR) 10 MG tablet Take 10 mg by mouth at bedtime.     . Multiple Vitamins-Minerals (CENTRUM SILVER PO) Take 1 tablet by mouth every morning.    Marland Kitchen omeprazole (PRILOSEC) 20 MG capsule Take 20 mg by mouth every morning.     Marland Kitchen oxyCODONE-acetaminophen (PERCOCET/ROXICET) 5-325 MG tablet Take 2 tablets by mouth every 4 (four) hours as needed for moderate pain or severe pain. 60 tablet 0  . polyethylene glycol (MIRALAX / GLYCOLAX) packet Take 17 g by mouth daily. 14 each 0  . pravastatin (PRAVACHOL) 40 MG tablet Take 40 mg by mouth at bedtime.     Marland Kitchen zolpidem (AMBIEN) 10 MG tablet Take 1 tablet (10 mg total) by mouth at bedtime as needed. for sleep 30 tablet 0   No current facility-administered medications for this visit.     OBJECTIVE: Vitals:   05/30/16 1057  BP: (!) 144/71  Pulse: 67  Resp: 18  Temp: 97.8 F (36.6 C)     Body mass index is 24.88 kg/m.    ECOG FS:1 - Symptomatic but completely ambulatory  General: Well-developed, well-nourished, no acute distress. Eyes: Pink conjunctiva, anicteric sclera. Breasts: Patient requested exam be deferred today. Lungs: Clear to auscultation bilaterally. Heart: Regular rate and rhythm. No rubs, murmurs, or gallops. Abdomen: Soft, nontender,  nondistended. No organomegaly noted, normoactive bowel sounds. Musculoskeletal: No edema, cyanosis, or clubbing.  Well-healing surgical scar on lumbar spine without erythema. Neuro: Alert, answering all questions appropriately. Cranial nerves grossly intact. Skin: No rashes or petechiae noted. Psych: Normal affect.   LAB RESULTS:  Lab Results  Component Value Date   NA 137 05/30/2016   K 4.4 05/30/2016   CL 101 05/30/2016   CO2 26 05/30/2016   GLUCOSE 91 05/30/2016   BUN 21 (H) 05/30/2016   CREATININE 0.89 05/30/2016   CALCIUM 9.2 05/30/2016   PROT 7.2 05/30/2016   ALBUMIN 4.2 05/30/2016   AST 24 05/30/2016   ALT 17 05/30/2016   ALKPHOS 89 05/30/2016   BILITOT 0.5 05/30/2016   GFRNONAA >60 05/30/2016   GFRAA >60  05/30/2016    Lab Results  Component Value Date   WBC 3.3 (L) 04/09/2016   NEUTROABS 4.4 02/26/2016   HGB 9.6 (L) 04/09/2016   HCT 29.8 (L) 04/09/2016   MCV 94.6 04/09/2016   PLT 228 04/09/2016   Lab Results  Component Value Date   LABCA2 32.1 02/26/2016     STUDIES: No results found.  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  PLAN:    1.  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 Clinically there is no obvious evidence of recurrence. MRI results from April 08, 2016 reviewed independently with "masslike ventral epidural enhancement at L3-L4". Needle aspiration was nondiagnostic due to insufficient material. Patient may repeat MRI and biopsy in the future if she continues to be symptomatic or there is continued enlargement of lesion.  Continue letrozole, which she will require indefinitely or until significant progression of disease. Return to clinic in 3 months with repeat laboratory work further  evaluation. 2.  Hip/back pain: MRI results as above.  Patient now complains of left hip pain which she indicates she may need replacement of the future. She has a history of a right hip replacement. Treatment per orthopedics.  3.  Diarrhea: Resolved. Continue Imodium as needed.  4.  Nausea: Phenergan, take as needed.  Approximately 30 minutes was spent in discussion of which grade and 50% was consultation.  Patient expressed understanding and was in agreement with this plan. She also understands that She can call clinic at any time with any questions, concerns, or complaints.    Lloyd Huger, MD   06/03/2016 3:01 PM

## 2016-05-30 ENCOUNTER — Encounter: Payer: Self-pay | Admitting: Internal Medicine

## 2016-05-30 ENCOUNTER — Inpatient Hospital Stay (HOSPITAL_BASED_OUTPATIENT_CLINIC_OR_DEPARTMENT_OTHER): Payer: Medicare Other | Admitting: Oncology

## 2016-05-30 ENCOUNTER — Inpatient Hospital Stay: Payer: Medicare Other | Attending: Oncology

## 2016-05-30 VITALS — BP 144/71 | HR 67 | Temp 97.8°F | Resp 18 | Wt 145.0 lb

## 2016-05-30 DIAGNOSIS — Z85828 Personal history of other malignant neoplasm of skin: Secondary | ICD-10-CM | POA: Insufficient documentation

## 2016-05-30 DIAGNOSIS — C7889 Secondary malignant neoplasm of other digestive organs: Secondary | ICD-10-CM | POA: Diagnosis not present

## 2016-05-30 DIAGNOSIS — R110 Nausea: Secondary | ICD-10-CM | POA: Insufficient documentation

## 2016-05-30 DIAGNOSIS — C7951 Secondary malignant neoplasm of bone: Secondary | ICD-10-CM

## 2016-05-30 DIAGNOSIS — M129 Arthropathy, unspecified: Secondary | ICD-10-CM

## 2016-05-30 DIAGNOSIS — R5383 Other fatigue: Secondary | ICD-10-CM

## 2016-05-30 DIAGNOSIS — M545 Low back pain: Secondary | ICD-10-CM | POA: Insufficient documentation

## 2016-05-30 DIAGNOSIS — E039 Hypothyroidism, unspecified: Secondary | ICD-10-CM

## 2016-05-30 DIAGNOSIS — C784 Secondary malignant neoplasm of small intestine: Secondary | ICD-10-CM | POA: Insufficient documentation

## 2016-05-30 DIAGNOSIS — C50411 Malignant neoplasm of upper-outer quadrant of right female breast: Secondary | ICD-10-CM | POA: Diagnosis not present

## 2016-05-30 DIAGNOSIS — Z79899 Other long term (current) drug therapy: Secondary | ICD-10-CM | POA: Diagnosis not present

## 2016-05-30 DIAGNOSIS — J449 Chronic obstructive pulmonary disease, unspecified: Secondary | ICD-10-CM | POA: Diagnosis not present

## 2016-05-30 DIAGNOSIS — E7800 Pure hypercholesterolemia, unspecified: Secondary | ICD-10-CM | POA: Diagnosis not present

## 2016-05-30 DIAGNOSIS — F17210 Nicotine dependence, cigarettes, uncomplicated: Secondary | ICD-10-CM | POA: Diagnosis not present

## 2016-05-30 DIAGNOSIS — G8929 Other chronic pain: Secondary | ICD-10-CM

## 2016-05-30 DIAGNOSIS — I10 Essential (primary) hypertension: Secondary | ICD-10-CM

## 2016-05-30 DIAGNOSIS — C50919 Malignant neoplasm of unspecified site of unspecified female breast: Secondary | ICD-10-CM

## 2016-05-30 DIAGNOSIS — R531 Weakness: Secondary | ICD-10-CM | POA: Insufficient documentation

## 2016-05-30 DIAGNOSIS — K219 Gastro-esophageal reflux disease without esophagitis: Secondary | ICD-10-CM

## 2016-05-30 DIAGNOSIS — Z170 Estrogen receptor positive status [ER+]: Principal | ICD-10-CM

## 2016-05-30 DIAGNOSIS — Z853 Personal history of malignant neoplasm of breast: Secondary | ICD-10-CM | POA: Diagnosis not present

## 2016-05-30 DIAGNOSIS — C50911 Malignant neoplasm of unspecified site of right female breast: Secondary | ICD-10-CM

## 2016-05-30 LAB — COMPREHENSIVE METABOLIC PANEL
ALBUMIN: 4.2 g/dL (ref 3.5–5.0)
ALT: 17 U/L (ref 14–54)
ANION GAP: 10 (ref 5–15)
AST: 24 U/L (ref 15–41)
Alkaline Phosphatase: 89 U/L (ref 38–126)
BILIRUBIN TOTAL: 0.5 mg/dL (ref 0.3–1.2)
BUN: 21 mg/dL — AB (ref 6–20)
CO2: 26 mmol/L (ref 22–32)
Calcium: 9.2 mg/dL (ref 8.9–10.3)
Chloride: 101 mmol/L (ref 101–111)
Creatinine, Ser: 0.89 mg/dL (ref 0.44–1.00)
GFR calc Af Amer: >60 mL/min (ref >60)
GFR calc non Af Amer: >60 mL/min (ref >60)
GLUCOSE: 91 mg/dL (ref 65–99)
POTASSIUM: 4.4 mmol/L (ref 3.5–5.1)
SODIUM: 137 mmol/L (ref 135–145)
Total Protein: 7.2 g/dL (ref 6.5–8.1)

## 2016-05-30 NOTE — Progress Notes (Signed)
States has chronic pain in knee and back. Found mass in right breast. Dr. Azalee Course schedule pt for mammogram on 10/23 to follow up. Continues to take pain medication due to severe back pain.

## 2016-06-10 ENCOUNTER — Ambulatory Visit
Admission: RE | Admit: 2016-06-10 | Discharge: 2016-06-10 | Disposition: A | Discharge status: Home / Self Care | Payer: Medicare Other | Source: Ambulatory Visit | Attending: Physician Assistant | Admitting: Physician Assistant

## 2016-06-10 DIAGNOSIS — N6310 Unspecified lump in the right breast, unspecified quadrant: Secondary | ICD-10-CM

## 2016-06-12 ENCOUNTER — Ambulatory Visit (INDEPENDENT_AMBULATORY_CARE_PROVIDER_SITE_OTHER): Payer: Medicare Other | Admitting: Internal Medicine

## 2016-06-12 ENCOUNTER — Encounter: Payer: Self-pay | Admitting: Internal Medicine

## 2016-06-12 VITALS — BP 179/74 | HR 74 | Wt 145.5 lb

## 2016-06-12 DIAGNOSIS — M5440 Lumbago with sciatica, unspecified side: Secondary | ICD-10-CM

## 2016-06-12 DIAGNOSIS — M779 Enthesopathy, unspecified: Secondary | ICD-10-CM

## 2016-06-12 DIAGNOSIS — Z9189 Other specified personal risk factors, not elsewhere classified: Secondary | ICD-10-CM | POA: Diagnosis not present

## 2016-06-12 DIAGNOSIS — M25552 Pain in left hip: Secondary | ICD-10-CM

## 2016-06-12 DIAGNOSIS — R899 Unspecified abnormal finding in specimens from other organs, systems and tissues: Secondary | ICD-10-CM | POA: Diagnosis not present

## 2016-06-12 DIAGNOSIS — R7982 Elevated C-reactive protein (CRP): Secondary | ICD-10-CM

## 2016-06-12 LAB — BASIC METABOLIC PANEL
BUN: 20 mg/dL (ref 7–25)
CALCIUM: 9.6 mg/dL (ref 8.6–10.4)
CO2: 27 mmol/L (ref 20–31)
Chloride: 103 mmol/L (ref 98–110)
Creat: 1.07 mg/dL — ABNORMAL HIGH (ref 0.60–0.93)
Glucose, Bld: 113 mg/dL — ABNORMAL HIGH (ref 65–99)
POTASSIUM: 4.3 mmol/L (ref 3.5–5.3)
SODIUM: 139 mmol/L (ref 135–146)

## 2016-06-12 LAB — CBC
HCT: 35.9 % (ref 35.0–45.0)
Hemoglobin: 12 g/dL (ref 11.7–15.5)
MCH: 30.2 pg (ref 27.0–33.0)
MCHC: 33.4 g/dL (ref 32.0–36.0)
MCV: 90.4 fL (ref 80.0–100.0)
MPV: 8.5 fL (ref 7.5–12.5)
Platelets: 233 10*3/uL (ref 140–400)
RBC: 3.97 MIL/uL (ref 3.80–5.10)
RDW: 14.2 % (ref 11.0–15.0)
WBC: 5.5 10*3/uL (ref 3.8–10.8)

## 2016-06-12 NOTE — Assessment & Plan Note (Signed)
Her back and left hip pain have gotten worse after stopping antibiotics one month ago. This certainly raises concern for ongoing infection. However there is also some possibility of progression of her degenerative arthritis. She had to have right hip replacement recently and was told that she would eventually need her left hip replaced. I will continue observation off of antibiotics for now but obtain blood work today and repeat MRI of lumbosacral spine and left hip.

## 2016-06-12 NOTE — Progress Notes (Signed)
[facility] for Infectious Disease  Patient Active Problem List   Diagnosis Date Noted  . Left hip pain 06/12/2016    Priority: High  . Acute low back pain 04/02/2016    Priority: High  . Asthma without status asthmaticus 10/17/2015  . Malignant neoplasm of upper-outer quadrant of right female breast ([place]) 10/17/2015  . Carpal tunnel syndrome 10/17/2015  . Cervical disc disease 10/17/2015  . Cervical pain 10/17/2015  . Benign essential HTN 10/17/2015  . Acid reflux 10/17/2015  . Combined fat and carbohydrate induced hyperlipemia 10/17/2015  . Adult hypothyroidism 10/17/2015  . Adaptive colitis 10/17/2015  . Primary osteoarthritis of right hip 10/03/2015  . Small bowel obstruction 01/30/2015  . B12 deficiency 01/30/2015  . Gastroesophageal reflux disease 01/30/2015  . IBS (irritable bowel syndrome) 01/30/2015  . Asthma 01/30/2015  . Hypothyroidism 01/30/2015  . FH: cholecystectomy 01/30/2015  . Breast cancer metastasized to multiple sites ([place]) 01/30/2015  . Bloodgood disease 05/30/2014  . Post menopausal syndrome 05/30/2014  . Raynaud's syndrome without gangrene 05/30/2014  . Chronic bronchitis ([place]) 01/26/2014    Patient's Medications  New Prescriptions   No medications on file  Previous Medications   ACETAMINOPHEN (TYLENOL) 500 MG TABLET    Take 1,000 mg by mouth daily as needed for fever.    ALPRAZOLAM (XANAX) 0.5 MG TABLET    Take 1 tablet (0.5 mg total) by mouth at bedtime as needed for sleep.   CETIRIZINE (ZYRTEC) 10 MG TABLET    Take 10 mg by mouth daily as needed for allergies. Reported on 10/03/2015   CITALOPRAM (CELEXA) 20 MG TABLET    Take 20 mg by mouth every morning.    FLUTICASONE-SALMETEROL (ADVAIR) 100-50 MCG/DOSE AEPB    Inhale 1 puff into the lungs every morning.    LETROZOLE (FEMARA) 2.5 MG TABLET    TAKE 1 TABLET ONE TIME DAILY   LEVOTHYROXINE (SYNTHROID, LEVOTHROID) 100 MCG TABLET    Take 100 mcg by mouth daily before breakfast.    LIDOCAINE (LIDODERM) 5 %    Place 1 patch onto the skin daily. Remove & Discard patch within 12 hours or as directed by MD   MONTELUKAST (SINGULAIR) 10 MG TABLET    Take 10 mg by mouth at bedtime.    MULTIPLE VITAMINS-MINERALS (CENTRUM SILVER PO)    Take 1 tablet by mouth every morning.   OMEPRAZOLE (PRILOSEC) 20 MG CAPSULE    Take 20 mg by mouth every morning.    OXYCODONE-ACETAMINOPHEN (PERCOCET) 10-325 MG TABLET    Take 1 tablet by mouth every 6 (six) hours as needed for pain.   POLYETHYLENE GLYCOL (MIRALAX / GLYCOLAX) PACKET    Take 17 g by mouth daily.   PRAVASTATIN (PRAVACHOL) 40 MG TABLET    Take 40 mg by mouth at bedtime.    ZOLPIDEM (AMBIEN) 10 MG TABLET    Take 1 tablet (10 mg total) by mouth at bedtime as needed. for sleep  Modified Medications   No medications on file  Discontinued Medications   OXYCODONE-ACETAMINOPHEN (PERCOCET/ROXICET) 5-325 MG TABLET    Take 2 tablets by mouth every 4 (four) hours as needed for moderate pain or severe pain.    Subjective: Ms. Ehresman is in for her follow-up visit. Sheis a 70 y.o.femalewith history of degenerative spine disease and metastatic breast cancer who developed acute on chronic low back pain in mid August after trying to get her 70 year old, demented mother out of the bathtub. The  pain was "20" out of 10 and radiated into her left buttock and down her left leg leading her to present to be admitted where she was found to be febrile with a temperature of 102. MRI showed new epidural enhancement at L3-4. Admission blood cultures were negative. Her sedimentation rate and C-reactive protein were elevated. Lumbar aspirate Gram stain showed abundant white blood cells but no organisms. Aspirate cultures were negative. I started her on empiric IV vancomycin and ceftriaxone and she improved over one week in the hospital. She defervesced and her back pain started to decrease. When I saw her back in clinic on 05/09/2016 she was still having pain but rated  it as 4 out of 10. She had been able to decrease her pain medication from every 4 hours to every 6 hours while awake. She had been able to be more active. She completed 6 weeks of IV antibiotic therapy on 05/15/2016 and had her PICC line removed.  She states that she has been doing worse since that time with increasing pain in her lower back and left hip radiating down her left leg. About one month ago her Percocet was increased to 10/325 which she is still taking about 3 times daily. She has not been able to be as active. She has not had any fever, chills or sweats.  Review of Systems: Review of Systems  Constitutional: Positive for malaise/fatigue. Negative for chills, diaphoresis, fever and weight loss.       She is appropriately upset and frustrated  HENT: Negative for sore throat.   Respiratory: Negative for cough, sputum production and shortness of breath.   Cardiovascular: Negative for chest pain.  Gastrointestinal: Negative for abdominal pain, diarrhea, nausea and vomiting.  Genitourinary: Negative for dysuria and frequency.  Musculoskeletal: Positive for back pain and joint pain. Negative for myalgias.  Skin: Negative for rash.  Neurological: Negative for dizziness, sensory change, focal weakness and headaches.    Past Medical History:  Diagnosis Date  . Arthritis    "back" (04/03/2016)  . Breast cancer ([place]) 2012    no lumpectomy. Treating with meds- Right  . Cancer ([place])    small intestines 2012, gallbladder 2016  . Chronic lower back pain   . COPD (chronic obstructive pulmonary disease) ([place])    "use inhalers for COPD that dx'd at one time" (04/03/2016)  . Family history of adverse reaction to anesthesia    daughter gets nauseated  . GERD (gastroesophageal reflux disease)   . High cholesterol   . Hypertension   . Hypothyroidism   . Skin cancer    "right neck; mid forehead"    Social History  Substance Use Topics  . Smoking status: Current Every Day Smoker     Packs/day: 1.00    Years: 54.00    Types: Cigarettes  . Smokeless tobacco: Never Used     Comment: not ready to quit  . Alcohol use No    Family History  Problem Relation Age of Onset  . Lung cancer Brother 60  . Heart disease Father   . Diabetes Father   . Breast cancer Neg Hx     Allergies  Allergen Reactions  . Carafate [Sucralfate] Nausea Only  . Diclofenac Sodium Nausea Only  . Erythromycin Diarrhea and Nausea And Vomiting  . Adhesive [Tape] Itching and Rash  . Wellbutrin [Bupropion] Itching and Rash    Objective: Vitals:   06/12/16 1446  BP: (!) 179/74  Pulse: 74  Weight: 145 lb 8 oz (  66 kg)   Body mass index is 24.98 kg/m.  Physical Exam  Constitutional: She is oriented to person, place, and time.  HENT:  Mouth/Throat: No oropharyngeal exudate.  Eyes: Conjunctivae are normal.  Cardiovascular: Normal rate and regular rhythm.   No murmur heard. Pulmonary/Chest: Effort normal and breath sounds normal. She has no wheezes. She has no rales.  Abdominal: Soft. She exhibits no mass. There is no tenderness.  Musculoskeletal: Normal range of motion.  Some tenderness with palpation over her lower spine and left hip laterally without other signs of inflammation.  Neurological: She is alert and oriented to person, place, and time.  She gets up slowly from her chair.  Skin: No rash noted.  Psychiatric: Mood and affect normal.      Problem List Items Addressed This Visit      High   Acute low back pain    Her back and left hip pain have gotten worse after stopping antibiotics one month ago. This certainly raises concern for ongoing infection. However there is also some possibility of progression of her degenerative arthritis. She had to have right hip replacement recently and was told that she would eventually need her left hip replaced. I will continue observation off of antibiotics for now but obtain blood work today and repeat MRI of lumbosacral spine and left  hip.      Relevant Medications   oxyCODONE-acetaminophen (PERCOCET) 10-325 MG tablet   Other Relevant Orders   CBC   Basic metabolic panel   Sed Rate (ESR)   Left hip pain - Primary   Relevant Orders   Sed Rate (ESR)    Other Visit Diagnoses    Inflammation around joint       Relevant Orders   Sed Rate (ESR)   Abnormal laboratory test       Elevated C-reactive protein (CRP)       At risk for osteomyelitis       Relevant Orders   C-reactive protein       Michel Bickers, MD [facility] for [facility] [phone] pager   [phone] cell 06/12/2016, 5:43 PM

## 2016-06-13 ENCOUNTER — Telehealth: Payer: Self-pay | Admitting: *Deleted

## 2016-06-13 ENCOUNTER — Other Ambulatory Visit: Payer: Self-pay | Admitting: Physician Assistant

## 2016-06-13 DIAGNOSIS — R928 Other abnormal and inconclusive findings on diagnostic imaging of breast: Secondary | ICD-10-CM

## 2016-06-13 LAB — SEDIMENTATION RATE: SED RATE: 14 mm/h (ref 0–30)

## 2016-06-13 NOTE — Addendum Note (Signed)
Addended by: Lorne Skeens D on: 06/13/2016 01:52 PM   Modules accepted: Orders

## 2016-06-13 NOTE — Telephone Encounter (Signed)
RN notified the patient.  MRIs scheduled for Nov 6, [facility] radiology, arrive @ 1545 at Radiology.  Patient verbalized back the appt.

## 2016-06-14 ENCOUNTER — Telehealth: Payer: Self-pay

## 2016-06-14 NOTE — Telephone Encounter (Signed)
Patient left message on triage voicemail requesting a sooner appointment for MRI. Called Radiology scheduling to check to see if there were any cancellations. No sooner appointments avaliable. Notified patient and explained that she could call back around Tuesday to see if there is a change. Patient stated understanding and will try back again. Rodman Key, LPN

## 2016-06-18 ENCOUNTER — Ambulatory Visit: Payer: Medicare Other | Admitting: Internal Medicine

## 2016-06-24 ENCOUNTER — Ambulatory Visit (HOSPITAL_COMMUNITY): Payer: Medicare Other

## 2016-06-24 ENCOUNTER — Ambulatory Visit (HOSPITAL_COMMUNITY)
Admission: RE | Admit: 2016-06-24 | Discharge: 2016-06-24 | Disposition: A | Discharge status: Home / Self Care | Payer: Medicare Other | Source: Ambulatory Visit | Attending: Internal Medicine | Admitting: Internal Medicine

## 2016-06-24 ENCOUNTER — Other Ambulatory Visit: Payer: Self-pay | Admitting: *Deleted

## 2016-06-24 DIAGNOSIS — M25552 Pain in left hip: Secondary | ICD-10-CM | POA: Diagnosis present

## 2016-06-24 DIAGNOSIS — M5440 Lumbago with sciatica, unspecified side: Secondary | ICD-10-CM | POA: Diagnosis present

## 2016-06-24 DIAGNOSIS — M2578 Osteophyte, vertebrae: Secondary | ICD-10-CM | POA: Diagnosis not present

## 2016-06-24 DIAGNOSIS — M1612 Unilateral primary osteoarthritis, left hip: Secondary | ICD-10-CM | POA: Diagnosis not present

## 2016-06-24 DIAGNOSIS — M48061 Spinal stenosis, lumbar region without neurogenic claudication: Secondary | ICD-10-CM | POA: Diagnosis not present

## 2016-06-24 DIAGNOSIS — C801 Malignant (primary) neoplasm, unspecified: Secondary | ICD-10-CM

## 2016-06-24 MED ORDER — GADOBENATE DIMEGLUMINE 529 MG/ML IV SOLN
15.0000 mL | Freq: Once | INTRAVENOUS | Status: AC
  Administered 2016-06-24: 14 mL via INTRAVENOUS

## 2016-06-24 MED ORDER — ZOLPIDEM TARTRATE 10 MG PO TABS: 10.0000 mg | ORAL_TABLET | Freq: Every evening | ORAL | 0 refills | Status: DC | PRN

## 2016-06-25 ENCOUNTER — Telehealth: Payer: Self-pay | Admitting: Internal Medicine

## 2016-06-25 NOTE — Telephone Encounter (Signed)
I called [facility] today and reviewed her recent lab work and MRIs. She completed 6 weeks of empiric vancomycin and ceftriaxone for possible lumbar infection on 05/15/2016. She has persistent low back and left hip pain. Her sedimentation rate has normalized and her lumbar MRI shows overall improvement in the L3-4 enhancement. I doubt that she has any active infection. Her left hip MRI shows a new left hip effusion associated with moderate to severe osteoarthritis and a torn labrum. I suggested reevaluation by her orthopedic surgeon. She will follow-up with me 07/09/2016.  Sed Rate (mm/hr)  Date Value  06/12/2016 14  04/10/2016 86 (H)  04/02/2016 60 (H)   CRP (mg/dL)  Date Value  04/10/2016 3.5 (H)  04/02/2016 9.4 (H)   MRI of lumbar spine with and without contrast 06/24/2016  IMPRESSION: Interval improvement with decreased L3-4 endplate enhancement and resolution of epidural enhancing soft tissue process. Minimal epidural enhancement at this level is symmetric and circumferential.  Paraspinal muscle region enhancement greatest on the left at the L3-4 and L4-5 level. This enhancement extends towards the left L3-4 and L4-5 facet joint. Minimal fluid within the left L4-5 facet joint and mild enhancement surrounding these facet joints. Findings may be related to degenerative changes and/or treatment of infection without significant change from the prior examination. Low level infection could not be entirely excluded if of high clinical concern.  Degenerative changes contribute to:  T11-12 slight narrowing ventral thecal sac.  L2-3 multifactorial moderate spinal stenosis.  L3-4 bulge/osteophyte with greater extension left foraminal/ lateral position with mild mass effect upon the exiting left L3 nerve root.  L4-5 right greater left foraminal narrowing with slight encroachment upon the exiting right L4 nerve root. Mild multifactorial spinal stenosis. Mild left lateral recess  narrowing greater on left.  L5-S1 broad-based disc osteophyte complex with foraminal/ lateral extension encroaching upon but not compressing exiting L5 nerve roots. No significant spinal stenosis.  Small sclerotic foci T12, L2 and S1 may represent treated metastatic lesions, unchanged.   Electronically Signed   By: Genia Del M.D.   On: 06/25/2016 10:01  MRI left hip with and without contrast 06/24/2016  IMPRESSION: 1. 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 Interval development of a left hip joint effusion with synovial enhancement which may be secondary to acute inflammatory process given the underlying osteoarthritis versus septic arthritis. If there is concern regarding septic arthritis, recommend arthrocentesis. 2. Superior left labral tear with a paralabral cyst.   Electronically Signed   By: Kathreen Devoid   On: 06/25/2016 10:09

## 2016-06-26 ENCOUNTER — Ambulatory Visit
Admission: RE | Admit: 2016-06-26 | Discharge: 2016-06-26 | Disposition: A | Discharge status: Home / Self Care | Payer: Medicare Other | Source: Ambulatory Visit | Attending: Physician Assistant | Admitting: Physician Assistant

## 2016-06-26 DIAGNOSIS — Z853 Personal history of malignant neoplasm of breast: Secondary | ICD-10-CM | POA: Diagnosis not present

## 2016-06-26 DIAGNOSIS — R928 Other abnormal and inconclusive findings on diagnostic imaging of breast: Secondary | ICD-10-CM | POA: Insufficient documentation

## 2016-06-26 HISTORY — PX: BREAST BIOPSY: SHX20

## 2016-07-01 LAB — SURGICAL PATHOLOGY

## 2016-07-05 ENCOUNTER — Telehealth: Payer: Self-pay | Admitting: *Deleted

## 2016-07-05 NOTE — Telephone Encounter (Signed)
Called to request results of her biopsy, her next appt is not until January  DIAGNOSIS:  A. RIGHT BREAST, 9:30, 3 CM FN; ULTRASOUND-GUIDED CORE BIOPSY:  - INVASIVE LOBULAR CARCINOMA.  - TUMOR SIZE IN THIS CORE SAMPLE: 8 MM.  - PRELIMINARY GRADE: 1 (NOT[place]HAM HISTOLOGIC GRADE)    TUBULAR/GLANDULAR DIFFERENTIATION SCORE: 3    NUCLEAR PLEOMORPHISM SCORE: 1    MITOTIC RATE SCORE: 1   Note: The history is noted. Results were called to Dr. Grayland Ormond at 3:00  pm on 06/27/16. Per clinician's request, ER, PR and HER2-neu  immunohistochemistry is obtained and results will be issued in an  addendum. HER2 FISH will be performed for equivocal results.

## 2016-07-05 NOTE — Telephone Encounter (Signed)
See can move her appoint up to December if pt wants.

## 2016-07-05 NOTE — Telephone Encounter (Signed)
Lattie Haw will call pt with appt

## 2016-07-09 ENCOUNTER — Ambulatory Visit (INDEPENDENT_AMBULATORY_CARE_PROVIDER_SITE_OTHER): Payer: Medicare Other | Admitting: Internal Medicine

## 2016-07-09 ENCOUNTER — Encounter: Payer: Self-pay | Admitting: Internal Medicine

## 2016-07-09 VITALS — BP 134/76 | Temp 97.8°F | Ht 64.0 in | Wt 144.8 lb

## 2016-07-09 DIAGNOSIS — M5442 Lumbago with sciatica, left side: Secondary | ICD-10-CM | POA: Diagnosis present

## 2016-07-09 NOTE — Progress Notes (Signed)
[facility] for [facility]ious [facility]  Patient Active Problem List   Diagnosis Date Noted  . Left hip pain 06/12/2016    Priority: High  . Acute low back pain 04/02/2016    Priority: High  . Asthma without status asthmaticus 10/17/2015  . Malignant neoplasm of upper-outer quadrant of right female breast ([place]) 10/17/2015  . Carpal tunnel syndrome 10/17/2015  . Cervical disc disease 10/17/2015  . Cervical pain 10/17/2015  . Benign essential HTN 10/17/2015  . Acid reflux 10/17/2015  . Combined fat and carbohydrate induced hyperlipemia 10/17/2015  . Adult hypothyroidism 10/17/2015  . Adaptive colitis 10/17/2015  . Primary osteoarthritis of right hip 10/03/2015  . Small bowel obstruction 01/30/2015  . B12 deficiency 01/30/2015  . Gastroesophageal reflux disease 01/30/2015  . IBS (irritable bowel syndrome) 01/30/2015  . Asthma 01/30/2015  . Hypothyroidism 01/30/2015  . FH: cholecystectomy 01/30/2015  . Breast cancer metastasized to multiple sites ([place]) 01/30/2015  . Bloodgood disease 05/30/2014  . Post menopausal syndrome 05/30/2014  . Raynaud's syndrome without gangrene 05/30/2014  . Chronic bronchitis ([place]) 01/26/2014    Patient's Medications  New Prescriptions   No medications on file  Previous Medications   ACETAMINOPHEN (TYLENOL) 500 MG TABLET    Take 1,000 mg by mouth daily as needed for fever.    ALPRAZOLAM (XANAX) 0.5 MG TABLET    Take 1 tablet (0.5 mg total) by mouth at bedtime as needed for sleep.   CETIRIZINE (ZYRTEC) 10 MG TABLET    Take 10 mg by mouth daily as needed for allergies. Reported on 10/03/2015   CITALOPRAM (CELEXA) 20 MG TABLET    Take 20 mg by mouth every morning.    FLUTICASONE-SALMETEROL (ADVAIR) 100-50 MCG/DOSE AEPB    Inhale 1 puff into the lungs every morning.    LETROZOLE (FEMARA) 2.5 MG TABLET    TAKE 1 TABLET ONE TIME DAILY   LEVOTHYROXINE (SYNTHROID, LEVOTHROID) 100 MCG TABLET    Take 100 mcg by mouth daily before breakfast.    LIDOCAINE (LIDODERM) 5 %    Place 1 patch onto the skin daily. Remove & Discard patch within 12 hours or as directed by MD   MONTELUKAST (SINGULAIR) 10 MG TABLET    Take 10 mg by mouth at bedtime.    MULTIPLE VITAMINS-MINERALS (CENTRUM SILVER PO)    Take 1 tablet by mouth every morning.   OMEPRAZOLE (PRILOSEC) 20 MG CAPSULE    Take 20 mg by mouth every morning.    OXYCODONE-ACETAMINOPHEN (PERCOCET) 10-325 MG TABLET    Take 1 tablet by mouth every 6 (six) hours as needed for pain.   POLYETHYLENE GLYCOL (MIRALAX / GLYCOLAX) PACKET    Take 17 g by mouth daily.   PRAVASTATIN (PRAVACHOL) 40 MG TABLET    Take 40 mg by mouth at bedtime.    ZOLPIDEM (AMBIEN) 10 MG TABLET    Take 1 tablet (10 mg total) by mouth at bedtime as needed. for sleep  Modified Medications   No medications on file  Discontinued Medications   No medications on file    Subjective: Ms. Sara [facility] is in for her routine follow-up visit. She completed 6 weeks of IV antibiotic therapy almost 2 months ago for her culture negative lumbar infection. She still has some low back pain and stiffness but it's much better than when she went in the hospital. She is having more pain in her left hip. At the time of her visit one month ago her  sedimentation rate had normalized and her lumbar MRI was showing some improvement. There was worsening degenerative arthritis and new left hip effusion noted on MRI. Since her last visit she has seen her orthopedic surgeon and is scheduled for total hip replacement on 07/26/2016.  Review of Systems: Review of Systems  Constitutional: Negative for chills, diaphoresis, fever, malaise/fatigue and weight loss.  Respiratory: Negative for cough, sputum production and shortness of breath.   Cardiovascular: Negative for chest pain.  Gastrointestinal: Negative for abdominal pain, diarrhea, nausea and vomiting.  Genitourinary: Negative for dysuria and frequency.  Musculoskeletal: Positive for back pain and joint pain.  Negative for myalgias.  Skin: Negative for rash.    Past Medical History:  Diagnosis Date  . Arthritis    "back" (04/03/2016)  . Breast cancer ([place]) 2012    no lumpectomy. Treating with meds- Right  . Cancer ([place])    small intestines 2012, gallbladder 2016  . Chronic lower back pain   . COPD (chronic obstructive pulmonary disease) ([place])    "use inhalers for COPD that dx'd at one time" (04/03/2016)  . Family history of adverse reaction to anesthesia    daughter gets nauseated  . GERD (gastroesophageal reflux disease)   . High cholesterol   . Hypertension   . Hypothyroidism   . Skin cancer    "right neck; mid forehead"    Social History  Substance Use Topics  . Smoking status: Current Every Day Smoker    Packs/day: 1.00    Years: 54.00    Types: Cigarettes  . Smokeless tobacco: Never Used     Comment: not ready to quit  . Alcohol use No    Family History  Problem Relation Age of Onset  . Lung cancer Brother 61  . Heart disease Father   . Diabetes Father   . Breast cancer Neg Hx     Allergies  Allergen Reactions  . Carafate [Sucralfate] Nausea Only  . Diclofenac Sodium Nausea Only  . Erythromycin Diarrhea and Nausea And Vomiting  . Adhesive [Tape] Itching and Rash  . Wellbutrin [Bupropion] Itching and Rash    Objective: Vitals:   07/09/16 1341  BP: 134/76  Temp: 97.8 F (36.6 C)  TempSrc: Oral  Weight: 144 lb 12.8 oz (65.7 kg)  Height: 5\' 4"  (1.626 m)   Body mass index is 24.85 kg/m.  Physical Exam  Constitutional: She is oriented to person, place, and time.  She is in with a friend today. She is in good spirits.  Musculoskeletal: Normal range of motion. She exhibits tenderness. She exhibits no edema.  Some pain with range of motion of left hip.  Neurological: She is oriented to person, place, and time. Gait normal.  Skin: No rash noted.  Psychiatric: Mood and affect normal.    Lab Results Sed Rate (mm/hr)  Date Value  06/12/2016 14    04/10/2016 86 (H)  04/02/2016 60 (H)   CRP (mg/dL)  Date Value  04/10/2016 3.5 (H)  04/02/2016 9.4 (H)   MRI of left hip 06/24/2016  IMPRESSION: 1. 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 Interval development of a left hip joint effusion with synovial enhancement which may be secondary to acute inflammatory process given the underlying osteoarthritis versus septic arthritis. If there is concern regarding septic arthritis, recommend arthrocentesis. 2. Superior left labral tear with a paralabral cyst.    By: Kathreen Devoid   On: 06/25/2016 10:09  MRI lumbar spine 06/24/2016  IMPRESSION: Interval improvement with decreased L3-4  endplate enhancement and resolution of epidural enhancing soft tissue process. Minimal epidural enhancement at this level is symmetric and circumferential.  Paraspinal muscle region enhancement greatest on the left at the L3-4 and L4-5 level. This enhancement extends towards the left L3-4 and L4-5 facet joint. Minimal fluid within the left L4-5 facet joint and mild enhancement surrounding these facet joints. Findings may be related to degenerative changes and/or treatment of infection without significant change from the prior examination. Low level infection could not be entirely excluded if of high clinical concern.  Degenerative changes contribute to:  T11-12 slight narrowing ventral thecal sac.  L2-3 multifactorial moderate spinal stenosis.  L3-4 bulge/osteophyte with greater extension left foraminal/ lateral position with mild mass effect upon the exiting left L3 nerve root.  L4-5 right greater left foraminal narrowing with slight encroachment upon the exiting right L4 nerve root. Mild multifactorial spinal stenosis. Mild left lateral recess narrowing greater on left.  L5-S1 broad-based disc osteophyte complex with foraminal/ lateral extension encroaching upon but not compressing exiting L5 nerve roots. No significant  spinal stenosis.  Small sclerotic foci T12, L2 and S1 may represent treated metastatic lesions, unchanged.    By: Genia Del M.D.   On: 06/25/2016 10:01   Problem List Items Addressed This Visit      High   Acute low back pain - Primary    I suspect that she did have lumbar infection in early fall and that it has now been cured. I suspect that most of her current pain is due to progressive, degenerative arthritis of her left hip. There is no indication for further antibiotic therapy. She can follow-up here as needed.          Michel Bickers, MD [facility] for [facility] [phone] pager   [phone] cell 07/09/2016, 2:06 PM

## 2016-07-09 NOTE — Assessment & Plan Note (Signed)
I suspect that she did have lumbar infection in early fall and that it has now been cured. I suspect that most of her current pain is due to progressive, degenerative arthritis of her left hip. There is no indication for further antibiotic therapy. She can follow-up here as needed.

## 2016-07-17 ENCOUNTER — Encounter: Payer: Self-pay | Admitting: *Deleted

## 2016-07-17 DIAGNOSIS — Z01810 Encounter for preprocedural cardiovascular examination: Secondary | ICD-10-CM | POA: Diagnosis present

## 2016-07-17 DIAGNOSIS — M1612 Unilateral primary osteoarthritis, left hip: Secondary | ICD-10-CM | POA: Diagnosis not present

## 2016-07-17 DIAGNOSIS — Z01812 Encounter for preprocedural laboratory examination: Secondary | ICD-10-CM | POA: Diagnosis present

## 2016-07-17 LAB — CBC
HCT: 38.4 % (ref 35.0–47.0)
HEMOGLOBIN: 13 g/dL (ref 12.0–16.0)
MCH: 30.4 pg (ref 26.0–34.0)
MCHC: 33.9 g/dL (ref 32.0–36.0)
MCV: 89.7 fL (ref 80.0–100.0)
Platelets: 259 10*3/uL (ref 150–440)
RBC: 4.28 MIL/uL (ref 3.80–5.20)
RDW: 14.2 % (ref 11.5–14.5)
WBC: 6.5 10*3/uL (ref 3.6–11.0)

## 2016-07-17 LAB — BASIC METABOLIC PANEL
Anion gap: 9 (ref 5–15)
BUN: 21 mg/dL — AB (ref 6–20)
CO2: 28 mmol/L (ref 22–32)
Calcium: 9.4 mg/dL (ref 8.9–10.3)
Chloride: 101 mmol/L (ref 101–111)
Creatinine, Ser: 0.95 mg/dL (ref 0.44–1.00)
GFR calc Af Amer: >60 mL/min (ref >60)
GFR, EST NON AFRICAN AMERICAN: 59 mL/min — AB (ref >60)
GLUCOSE: 91 mg/dL (ref 65–99)
POTASSIUM: 3.8 mmol/L (ref 3.5–5.1)
Sodium: 138 mmol/L (ref 135–145)

## 2016-07-17 LAB — URINALYSIS COMPLETE WITH MICROSCOPIC (ARMC ONLY)
BACTERIA UA: NONE SEEN
Bilirubin Urine: NEGATIVE
Glucose, UA: NEGATIVE mg/dL
Hgb urine dipstick: NEGATIVE
Ketones, ur: NEGATIVE mg/dL
Leukocytes, UA: NEGATIVE
Nitrite: NEGATIVE
PROTEIN: 30 mg/dL — AB
SPECIFIC GRAVITY, URINE: 1.021 (ref 1.005–1.030)
SQUAMOUS EPITHELIAL / LPF: NONE SEEN
pH: 6 (ref 5.0–8.0)

## 2016-07-17 LAB — TYPE AND SCREEN
ABO/RH(D): O NEG
Antibody Screen: NEGATIVE

## 2016-07-17 LAB — PROTIME-INR
INR: 0.85
PROTHROMBIN TIME: 11.6 s (ref 11.4–15.2)

## 2016-07-17 LAB — SURGICAL PCR SCREEN
MRSA, PCR: NEGATIVE
Staphylococcus aureus: NEGATIVE

## 2016-07-17 LAB — APTT: APTT: 29 s (ref 24–36)

## 2016-07-17 LAB — SEDIMENTATION RATE: SED RATE: 18 mm/h (ref 0–30)

## 2016-07-17 MED ORDER — FAMOTIDINE 20 MG PO TABS: 20.0000 mg | ORAL_TABLET | Freq: Once | ORAL | Status: DC

## 2016-07-17 NOTE — Patient Instructions (Signed)
  Your procedure is scheduled on: December 7th, 2017  Report to [facility] Floor  To find out your arrival time please call [phone] between 1PM - 3PM on Wednesday, December 6th, 2017.  Remember: Instructions that are not followed completely may result in serious medical risk, up to and including death, or upon the discretion of your surgeon and anesthesiologist your surgery may need to be rescheduled.    __X__ 1. Do not eat food or drink liquids after midnight. No gum chewing or hard candies.      ____    2. No Alcohol for 24 hours before or after surgery.   __X__  3. Do Not Smoke For 24 Hours Prior to Your Surgery.   ____    4. Bring all medications with you on the day of surgery if instructed.    ____    5. Notify your doctor if there is any change in your medical condition     (cold, fever, infections).       Do not wear jewelry, make-up, hairpins, clips or nail polish.  Do not wear lotions, powders, or perfumes. You may wear deodorant.  Do not shave 48 hours prior to surgery. Men may shave face and neck.  Do not bring valuables to the hospital.     [facility] is not responsible for any belongings or valuables.               Contacts, dentures or bridgework may not be worn into surgery.  Leave your suitcase in the car. After surgery it may be brought to your room.  For patients admitted to the hospital, discharge time is determined by your treatment team.   Patients discharged the day of surgery will not be allowed to drive home.   Please read over the following fact sheets that you were given:   Medication Instruction Sheet  __XX__ Take these medicines the morning of surgery with A SIP OF WATER:    1. Xanax  2. Zyrtec  3. Celexa  4. Advair  5. Synthroid  6. Omeprazole the night before surgery and the morning of surgery             7. Pain medication if needed in the morning of surgery  ____ Fleet Enema (as directed)   ____ Use CHG Soap as  directed  ___X Take inhalers on the morning of surgery.  ____ Take 1/2 of usual insulin dose the night before surgery and none on the morning of surgery.   __X__ Stop Coumadin/Plavix/aspirin as of today, July 17, 2016  __X__ Stop Anti-inflammatories as of today, July 17, 2016.   __X__ Stop supplements until after surgery.    ____ Bring C-Pap to the hospital.

## 2016-07-18 ENCOUNTER — Encounter
Admission: RE | Admit: 2016-07-18 | Discharge: 2016-07-18 | Disposition: A | Discharge status: Home / Self Care | Payer: Medicare Other | Source: Ambulatory Visit | Attending: Orthopedic Surgery | Admitting: Orthopedic Surgery

## 2016-07-18 DIAGNOSIS — Z01810 Encounter for preprocedural cardiovascular examination: Secondary | ICD-10-CM | POA: Insufficient documentation

## 2016-07-18 DIAGNOSIS — M1612 Unilateral primary osteoarthritis, left hip: Secondary | ICD-10-CM | POA: Insufficient documentation

## 2016-07-18 DIAGNOSIS — Z01812 Encounter for preprocedural laboratory examination: Secondary | ICD-10-CM | POA: Insufficient documentation

## 2016-07-18 HISTORY — DX: Anxiety disorder, unspecified: F41.9

## 2016-07-18 LAB — URINE CULTURE: CULTURE: NO GROWTH

## 2016-07-22 ENCOUNTER — Inpatient Hospital Stay (HOSPITAL_BASED_OUTPATIENT_CLINIC_OR_DEPARTMENT_OTHER): Payer: Medicare Other | Admitting: Oncology

## 2016-07-22 ENCOUNTER — Other Ambulatory Visit: Payer: Self-pay | Admitting: *Deleted

## 2016-07-22 ENCOUNTER — Inpatient Hospital Stay: Payer: Medicare Other | Attending: Oncology

## 2016-07-22 ENCOUNTER — Other Ambulatory Visit: Payer: Self-pay

## 2016-07-22 VITALS — BP 169/88 | HR 78 | Temp 99.0°F | Ht 64.0 in | Wt 144.6 lb

## 2016-07-22 DIAGNOSIS — F17210 Nicotine dependence, cigarettes, uncomplicated: Secondary | ICD-10-CM | POA: Insufficient documentation

## 2016-07-22 DIAGNOSIS — M2578 Osteophyte, vertebrae: Secondary | ICD-10-CM | POA: Insufficient documentation

## 2016-07-22 DIAGNOSIS — R531 Weakness: Secondary | ICD-10-CM

## 2016-07-22 DIAGNOSIS — Z85828 Personal history of other malignant neoplasm of skin: Secondary | ICD-10-CM | POA: Insufficient documentation

## 2016-07-22 DIAGNOSIS — C7951 Secondary malignant neoplasm of bone: Secondary | ICD-10-CM | POA: Diagnosis not present

## 2016-07-22 DIAGNOSIS — M25552 Pain in left hip: Secondary | ICD-10-CM

## 2016-07-22 DIAGNOSIS — C7889 Secondary malignant neoplasm of other digestive organs: Secondary | ICD-10-CM

## 2016-07-22 DIAGNOSIS — R978 Other abnormal tumor markers: Secondary | ICD-10-CM | POA: Insufficient documentation

## 2016-07-22 DIAGNOSIS — J449 Chronic obstructive pulmonary disease, unspecified: Secondary | ICD-10-CM

## 2016-07-22 DIAGNOSIS — R197 Diarrhea, unspecified: Secondary | ICD-10-CM

## 2016-07-22 DIAGNOSIS — C50411 Malignant neoplasm of upper-outer quadrant of right female breast: Secondary | ICD-10-CM | POA: Insufficient documentation

## 2016-07-22 DIAGNOSIS — R110 Nausea: Secondary | ICD-10-CM

## 2016-07-22 DIAGNOSIS — M5126 Other intervertebral disc displacement, lumbar region: Secondary | ICD-10-CM

## 2016-07-22 DIAGNOSIS — C801 Malignant (primary) neoplasm, unspecified: Secondary | ICD-10-CM

## 2016-07-22 DIAGNOSIS — E039 Hypothyroidism, unspecified: Secondary | ICD-10-CM | POA: Insufficient documentation

## 2016-07-22 DIAGNOSIS — M48061 Spinal stenosis, lumbar region without neurogenic claudication: Secondary | ICD-10-CM

## 2016-07-22 DIAGNOSIS — R5383 Other fatigue: Secondary | ICD-10-CM | POA: Insufficient documentation

## 2016-07-22 DIAGNOSIS — M549 Dorsalgia, unspecified: Secondary | ICD-10-CM

## 2016-07-22 DIAGNOSIS — K219 Gastro-esophageal reflux disease without esophagitis: Secondary | ICD-10-CM | POA: Insufficient documentation

## 2016-07-22 DIAGNOSIS — E7800 Pure hypercholesterolemia, unspecified: Secondary | ICD-10-CM | POA: Insufficient documentation

## 2016-07-22 DIAGNOSIS — F419 Anxiety disorder, unspecified: Secondary | ICD-10-CM | POA: Insufficient documentation

## 2016-07-22 DIAGNOSIS — C784 Secondary malignant neoplasm of small intestine: Secondary | ICD-10-CM

## 2016-07-22 DIAGNOSIS — C50911 Malignant neoplasm of unspecified site of right female breast: Secondary | ICD-10-CM

## 2016-07-22 DIAGNOSIS — Z79811 Long term (current) use of aromatase inhibitors: Secondary | ICD-10-CM | POA: Insufficient documentation

## 2016-07-22 DIAGNOSIS — I10 Essential (primary) hypertension: Secondary | ICD-10-CM

## 2016-07-22 DIAGNOSIS — Z79899 Other long term (current) drug therapy: Secondary | ICD-10-CM | POA: Insufficient documentation

## 2016-07-22 DIAGNOSIS — M5137 Other intervertebral disc degeneration, lumbosacral region: Secondary | ICD-10-CM | POA: Insufficient documentation

## 2016-07-22 DIAGNOSIS — Z170 Estrogen receptor positive status [ER+]: Principal | ICD-10-CM

## 2016-07-22 MED ORDER — ZOLPIDEM TARTRATE 10 MG PO TABS: 10.0000 mg | ORAL_TABLET | Freq: Every evening | ORAL | 0 refills | Status: DC | PRN

## 2016-07-22 NOTE — Progress Notes (Signed)
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  INTERVAL HISTORY: Patient returns to clinic today for routine follow-up. She recently had a breast biopsy that was positive and consistent with her known primary of lobular carcinoma. She continues to have significant back pain and left hip and leg pain, but states she is having a hip replacement tomorrow. She had a right hip replacement earlier this year. She otherwise feels well.  She has no neurologic complaints.  She denies any chest pain or shortness of breath. She has a fair appetite and denies weight loss. She denies vomiting or constipation.  She has no melena or hematochezia.  She has no urinary complaints.  Patient offers no further specific complaints today.  REVIEW OF SYSTEMS:   Review of Systems  Constitutional: Positive for malaise/fatigue. Negative for fever and weight loss.  Respiratory: Negative.  Negative for cough and shortness of breath.   Cardiovascular: Negative.  Negative for chest pain and leg swelling.  Gastrointestinal: Negative for abdominal pain, blood in stool and melena.  Genitourinary: Negative.   Musculoskeletal: Positive for back pain and joint pain.  Neurological: Positive for weakness.  Psychiatric/Behavioral: Negative.  The patient is not nervous/anxious.     As per HPI. Otherwise, a complete review of systems is negative.  PAST MEDICAL HISTORY: Past Medical History:  Diagnosis Date  . Anxiety   . Arthritis    "back" (04/03/2016)  . Breast cancer ([place]) 2012    no lumpectomy. Treating with meds- Right  . Cancer ([place])    small  intestines 2012, gallbladder 2016  . Chronic lower back pain   . COPD (chronic obstructive pulmonary disease) ([place])    "use inhalers for COPD that dx'd at one time" (04/03/2016)  . Family history of adverse reaction to anesthesia    daughter gets nauseated  . GERD (gastroesophageal reflux disease)   . High cholesterol   . Hypertension   . Hypothyroidism   . Skin cancer    "right neck; mid forehead"    PAST SURGICAL HISTORY: Past Surgical History:  Procedure Laterality Date  . ABDOMINAL HERNIA REPAIR     "after they took tumor out of my colon I developed a hernia"  . ABDOMINAL HYSTERECTOMY    . BACK SURGERY    . BREAST BIOPSY Right 2012   positive  . BREAST BIOPSY Right 2014   positive  . BREAST BIOPSY Right 06/26/2016   US guided - waiting for pathology  . BREAST EXCISIONAL BIOPSY Right 1995   benign  . CARPAL TUNNEL RELEASE Left   . DILATION AND CURETTAGE OF UTERUS     S/P miscarriage  . IR GENERIC HISTORICAL  04/04/2016   IR LUMBAR DISC ASPIRATION W/IMG GUIDE 04/04/2016 [name], MD [facility]  . JOINT REPLACEMENT    . LAPAROSCOPIC CHOLECYSTECTOMY  2016  . SKIN CANCER EXCISION     "right neck; mid forehead"  . TOTAL HIP ARTHROPLASTY Right 10/03/2015   Procedure: TOTAL HIP ARTHROPLASTY ANTERIOR APPROACH;  Surgeon: Hessie Knows, MD;  Location: [facility] ORS;  Service: Orthopedics;  Laterality: Right;  . TUBAL LIGATION    . TUMOR EXCISION  2012   "small intestine; came  from the breast cancer"    FAMILY HISTORY: Reviewed and unchanged. No reported history of breast cancer or other chronic diseases.     ADVANCED DIRECTIVES:    HEALTH MAINTENANCE: Social History  Substance Use Topics  . Smoking status: Current Every Day Smoker    Packs/day: 1.00    Years: 54.00    Types: Cigarettes  . Smokeless tobacco: Never Used     Comment: not ready to quit  . Alcohol use No     Colonoscopy:  PAP:  Bone density:  Lipid panel:  Allergies  Allergen Reactions  .  Carafate [Sucralfate] Nausea Only  . Diclofenac Sodium Nausea Only  . Erythromycin Diarrhea and Nausea And Vomiting  . Adhesive [Tape] Itching and Rash  . Wellbutrin [Bupropion] Itching and Rash    Current Outpatient Prescriptions  Medication Sig Dispense Refill  . acetaminophen (TYLENOL) 500 MG tablet Take 1,000 mg by mouth daily as needed for mild pain or fever.     . ALPRAZolam (XANAX) 0.5 MG tablet Take 1 tablet (0.5 mg total) by mouth at bedtime as needed for sleep. 20 tablet 0  . cetirizine (ZYRTEC) 10 MG tablet Take 10 mg by mouth daily as needed for allergies. Reported on 10/03/2015    . citalopram (CELEXA) 20 MG tablet Take 20 mg by mouth every morning.     . dicyclomine (BENTYL) 10 MG capsule Take 10 mg by mouth 3 (three) times daily as needed for spasms.    . Fluticasone-Salmeterol (ADVAIR) 100-50 MCG/DOSE AEPB Inhale 1 puff into the lungs every morning.     Marland Kitchen letrozole (FEMARA) 2.5 MG tablet TAKE 1 TABLET ONE TIME DAILY 90 tablet 4  . levothyroxine (SYNTHROID, LEVOTHROID) 100 MCG tablet Take 100 mcg by mouth daily before breakfast.     . montelukast (SINGULAIR) 10 MG tablet Take 10 mg by mouth at bedtime.     . Multiple Vitamins-Minerals (CENTRUM SILVER PO) Take 1 tablet by mouth every morning.    Marland Kitchen omeprazole (PRILOSEC) 20 MG capsule Take 20 mg by mouth every morning.     Marland Kitchen oxyCODONE-acetaminophen (PERCOCET) 10-325 MG tablet Take 1 tablet by mouth every 6 (six) hours as needed for pain.    . polyethylene glycol (MIRALAX / GLYCOLAX) packet Take 17 g by mouth daily. (Patient taking differently: Take 17 g by mouth daily as needed for mild constipation. ) 14 each 0  . pravastatin (PRAVACHOL) 40 MG tablet Take 40 mg by mouth at bedtime.     Marland Kitchen zolpidem (AMBIEN) 10 MG tablet Take 1 tablet (10 mg total) by mouth at bedtime as needed. for sleep 30 tablet 0   No current facility-administered medications for this visit.     OBJECTIVE: Vitals:   07/22/16 1506  BP: (!) 169/88  Pulse:  78  Temp: 99 F (37.2 C)     Body mass index is 24.82 kg/m.    ECOG FS:1 - Symptomatic but completely ambulatory  General: Well-developed, well-nourished, no acute distress. Eyes: Pink conjunctiva, anicteric sclera. Breasts: Patient requested exam be deferred today. Lungs: Clear to auscultation bilaterally. Heart: Regular rate and rhythm. No rubs, murmurs, or gallops. Abdomen: Soft, nontender, nondistended. No organomegaly noted, normoactive bowel sounds. Musculoskeletal: No edema, cyanosis, or clubbing.  Well-healing surgical scar on lumbar spine without erythema. Neuro: Alert, answering all questions appropriately. Cranial nerves grossly intact. Skin: No rashes or petechiae noted. Psych: Normal affect.   LAB RESULTS:  Lab Results  Component Value Date   NA 138 07/17/2016  K 3.8 07/17/2016   CL 101 07/17/2016   CO2 28 07/17/2016   GLUCOSE 91 07/17/2016   BUN 21 (H) 07/17/2016   CREATININE 0.95 07/17/2016   CALCIUM 9.4 07/17/2016   PROT 7.2 05/30/2016   ALBUMIN 4.2 05/30/2016   AST 24 05/30/2016   ALT 17 05/30/2016   ALKPHOS 89 05/30/2016   BILITOT 0.5 05/30/2016   GFRNONAA 59 (L) 07/17/2016   GFRAA >60 07/17/2016    Lab Results  Component Value Date   WBC 6.5 07/17/2016   NEUTROABS 4.4 02/26/2016   HGB 13.0 07/17/2016   HCT 38.4 07/17/2016   MCV 89.7 07/17/2016   PLT 259 07/17/2016   Lab Results  Component Value Date   LABCA2 39.5 (H) 07/22/2016     STUDIES: Mr Lumbar Spine W Wo Contrast  Result Date: 06/25/2016 CLINICAL DATA:  70 year old female with history of breast cancer. Treated for discitis. Back and left hip pain getting worse after stopping antibiotics 1 month ago. Concern for ongoing infection. Subsequent encounter. EXAM: MRI LUMBAR SPINE WITHOUT AND WITH CONTRAST TECHNIQUE: Multiplanar and multiecho pulse sequences of the lumbar spine were obtained without and with intravenous contrast. CONTRAST:  40m MULTIHANCE GADOBENATE DIMEGLUMINE 529 MG/ML  IV SOLN COMPARISON:  04/08/2016, 04/02/2016 and 09/01/2015 MR. FINDINGS: Segmentation: Last fully open disk space is labeled L5-S1. Present examination incorporates from T10-11 disc space through lower sacrum. Alignment:  Mild curvature. Vertebrae: Small sclerotic foci T12, L2 and S1 may represent treated metastatic lesions. L3-4 endplate reactive changes greater on the left with decreased level of enhancement. This may represent sequela of discitis. Please see below. L4-5 degenerative changes in enhancement greater on the right unchanged. T11-12 degenerative changes with minimal endplate reactive changes/enhancement stable. Conus medullaris: Extends to the L1 level without enhancement. Paraspinal and other soft tissues: Paraspinal muscle region enhancement greatest on the left at the L3-4 and L4-5 level. This enhancement extends towards the left L3-4 and L4-5 facet joint. Minimal fluid within the left L4-5 facet joint and mild enhancement surrounding these facet joints. Findings may be related to degenerative changes and/or treatment of infection without significant change from the prior examination. Low level infection without drainable abscess cannot be entirely excluded if of high clinical concern. Disc levels: T10-11:  Negative. T11-12: Disc degeneration with minimal retrolisthesis T11. Anterior osteophyte endplate reactive changes. Minimal bulge. Slight narrowing ventral thecal sac. Minimal facet degenerative changes. T12-L1:  Negative. L1-2:  Mild facet bony overgrowth.  Minimal bulge. L2-3: Moderate facet bony overgrowth and ligamentum flavum hypertrophy. Moderate bulge/spur. Multifactorial moderate spinal stenosis. Very mild foraminal narrowing slightly greater on the right. L3-4: Prior left hemilaminectomy. Interval improvement with clearing of masslike enhancing tissue previously noted. Now with diffuse low level enhancement surrounding the thecal sac and operative site. Bulge/osteophyte with greater  extension left foraminal/ lateral position with mild mass effect upon the exiting left L3 nerve root. L4-5: Facet bony overgrowth. Ligamentum flavum hypertrophy greater on the right. Bulge/ osteophyte with foraminal/ lateral extension greater to the right. Right greater left foraminal narrowing with slight encroachment upon the exiting right L4 nerve root. Mild multifactorial spinal stenosis. Mild left lateral recess narrowing greater on left. L5-S1: Broad-based disc osteophyte complex with foraminal/ lateral extension encroaching upon but not compressing exiting L5 nerve roots. No significant spinal stenosis. Atherosclerotic changes aorta with bulge lower abdominal aorta measuring up to 2.6 cm. Ectatic left common iliac artery measuring up to 1.4 cm. IMPRESSION: Interval improvement with decreased L3-4 endplate enhancement and resolution of epidural enhancing  soft tissue process. Minimal epidural enhancement at this level is symmetric and circumferential. Paraspinal muscle region enhancement greatest on the left at the L3-4 and L4-5 level. This enhancement extends towards the left L3-4 and L4-5 facet joint. Minimal fluid within the left L4-5 facet joint and mild enhancement surrounding these facet joints. Findings may be related to degenerative changes and/or treatment of infection without significant change from the prior examination. Low level infection could not be entirely excluded if of high clinical concern. Degenerative changes contribute to: T11-12 slight narrowing ventral thecal sac. L2-3 multifactorial moderate spinal stenosis. L3-4 bulge/osteophyte with greater extension left foraminal/ lateral position with mild mass effect upon the exiting left L3 nerve root. L4-5 right greater left foraminal narrowing with slight encroachment upon the exiting right L4 nerve root. Mild multifactorial spinal stenosis. Mild left lateral recess narrowing greater on left. L5-S1 broad-based disc osteophyte complex with  foraminal/ lateral extension encroaching upon but not compressing exiting L5 nerve roots. No significant spinal stenosis. Small sclerotic foci T12, L2 and S1 may represent treated metastatic lesions, unchanged. Electronically Signed   By: Lacy Duverney M.D.   On: 06/25/2016 10:01   Mr Hip Left W Wo Contrast  Result Date: 06/25/2016 CLINICAL DATA:  Low back pain, left hip pain EXAM: MRI OF THE LEFT HIP WITHOUT AND WITH CONTRAST TECHNIQUE: Multiplanar, multisequence MR imaging was performed both before and after administration of intravenous contrast. CONTRAST:  57mL MULTIHANCE GADOBENATE DIMEGLUMINE 529 MG/ML IV SOLN COMPARISON:  04/08/2016 FINDINGS: Bones: Right total hip arthroplasty with susceptibility artifact partially obscuring the adjacent soft tissue and osseous structures. No periarticular fluid collection or osteolysis. No left hip fracture, dislocation or avascular necrosis. Subchondral reactive marrow changes. Degenerative disc disease with disc height loss at L3-4, L4-5 and L5-S1. No SI joint widening or erosive changes. Articular cartilage and labrum Articular cartilage: High-grade partial-thickness cartilage loss with areas of full-thickness cartilage loss of the left femoral head and acetabulum. Labrum: Superior left labral tear with a 14 x 26 mm paralabral cyst. Joint or bursal effusion Joint effusion: Small left hip joint effusion with synovial enhancement and periarticular enhancement. Bursae:  No bursa formation. Muscles and tendons Muscles and tendons: Flexors: Normal. Extensors: Normal. Abductors: Normal. Adductors: Normal. Rotators: Normal. Hamstrings: Normal. Other findings Miscellaneous: No fluid collection or hematoma. Small amount of pelvic free fluid. Diverticulosis without evidence of diverticulitis. No inguinal lymphadenopathy. No inguinal hernia. New IMPRESSION: 1. 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 Interval development of a left hip joint effusion with synovial  enhancement which may be secondary to acute inflammatory process given the underlying osteoarthritis versus septic arthritis. If there is concern regarding septic arthritis, recommend arthrocentesis. 2. Superior left labral tear with a paralabral cyst. Electronically Signed   By: Elige Ko   On: 06/25/2016 10:09   Mm Clip Placement Right  Result Date: 06/26/2016 CLINICAL DATA:  Status post ultrasound-guided core biopsy mass in the 930 o'clock location of the right breast. EXAM: DIAGNOSTIC RIGHT MAMMOGRAM POST ULTRASOUND BIOPSY COMPARISON:  Previous exam(s). FINDINGS: Mammographic images were obtained following ultrasound guided biopsy of mass in the 930 o'clock location of the right breast. A coil shaped clip is identified in the upper-outer quadrant of the right breast following biopsy. A T-shaped clip is identified at the site of remote stereotactic biopsy. IMPRESSION: Coil shaped tissue marker clip in the expected location following biopsy. Final Assessment: Post Procedure Mammograms for Marker Placement Electronically Signed   By: Norva Pavlov M.D.   On: 06/26/2016  15:09   [place] Rt Breast Bx W Loc Dev 1st Lesion Img Bx Spec US Guide  Addendum Date: 07/01/2016   ADDENDUM REPORT: 06/28/2016 11:42 ADDENDUM: Pathology of the right breast biopsy revealed A. RIGHT BREAST, 9:30, 3 CM FN; ULTRASOUND-GUIDED CORE BIOPSY: INVASIVE LOBULAR CARCINOMA. TUMOR SIZE IN THIS CORE SAMPLE: 8 MM. PRELIMINARY GRADE: 1 ([place] HISTOLOGIC GRADE). Note: The history is noted. Results were called to Dr. Grayland Ormond at 3:00 pm on 06/27/16. Per clinician's request, ER, PR and HER2-neu immunohistochemistry is obtained and results will be issued in an addendum. HER2 FISH will be performed for equivocal results. This was found to be concordant by Dr. Owens Shark. Recommendation: Clinical follow-up with oncologist and primary care provider. Results were relayed to Paulita Cradle, PA's nurse [place] on 06/28/16 by Jetta Lout, [place]  [facility]). She stated they have received the results and that Paulita Cradle will contact the patient with the results. Attempts were made on 06/27/16 and 06/28/16 to contact the patient for a post biopsy check by Jetta Lout, RRA but were unsuccessful. Messages to return the calls were not answered. Addendum by Jetta Lout, RRA on 06/28/16. Electronically Signed   By: Nolon Nations M.D.   On: 06/28/2016 11:42   Result Date: 07/01/2016 CLINICAL DATA:  Patient presents for ultrasound-guided core biopsy of the upper-outer quadrant of the right breast. History of lobular carcinoma of the right breast, metastatic to bowel and gallbladder. Patient has had diagnostic excisional biopsy of the right breast with positive margins. She is not had definitive surgery of the right breast. Palpable abnormality in the right breast is suspicious for enlarging primary or new phenotype. Biopsy is requested to guide therapy. Patient is currently on Letrozole. EXAM: ULTRASOUND GUIDED RIGHT BREAST CORE NEEDLE BIOPSY COMPARISON:  Previous exam(s). FINDINGS: I met with the patient and we discussed the procedure of ultrasound-guided biopsy, including benefits and alternatives. We discussed the high likelihood of a successful procedure. We discussed the risks of the procedure, including infection, bleeding, tissue injury, clip migration, and inadequate sampling. Informed written consent was given. The usual time-out protocol was performed immediately prior to the procedure. Using sterile technique and 1% Lidocaine as local anesthetic, under direct ultrasound visualization, a 12 gauge spring-loaded device was used to perform biopsy of mass in the 930 o'clock location of the right breast using a caudal approach. At the conclusion of the procedure a coil shaped tissue marker clip was deployed into the biopsy cavity. Follow up 2 view mammogram was performed and dictated separately. IMPRESSION: Ultrasound guided biopsy of  right breast mass. No apparent complications. Electronically Signed: By: Nolon Nations M.D. 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  PLAN:    1.  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 Biopsy of right breast consistent with patient's known primary. We discussed the possibility of adding Ibrance to her treatment regimen, but ultimately agreed to not initiate any treatment until her orthopedic issues have resolved.  Continue letrozole, which she will require indefinitely or until significant progression of disease. Return to clinic in 3 months with repeat laboratory work further evaluation. 2.  Hip/back pain: MRI results as above, patient has left hip replacement scheduled for tomorrow.  She has a history of a right hip replacement. Treatment per orthopedics.  3.  Diarrhea: Resolved. Continue Imodium as needed.  4.  Nausea: Phenergan, take as needed.  Approximately 30 minutes was spent in discussion of which grade and 50% was consultation.  Patient expressed understanding and was in agreement with this plan. She also understands that She can call clinic at any time with any questions, concerns, or complaints.    Lloyd Huger, MD   07/24/2016 3:49 PM

## 2016-07-22 NOTE — Progress Notes (Signed)
Patient here for follow up. No changes since last appt. 

## 2016-07-23 LAB — CANCER ANTIGEN 27.29: CA 27.29: 39.5 U/mL — ABNORMAL HIGH (ref 0.0–38.6)

## 2016-07-24 ENCOUNTER — Other Ambulatory Visit: Payer: Medicare Other

## 2016-07-25 ENCOUNTER — Inpatient Hospital Stay: Payer: Medicare Other

## 2016-07-25 ENCOUNTER — Inpatient Hospital Stay: Payer: Medicare Other | Admitting: Anesthesiology

## 2016-07-25 ENCOUNTER — Encounter: Payer: Self-pay | Admitting: Anesthesiology

## 2016-07-25 ENCOUNTER — Encounter
Admission: RE | Disposition: A | Discharge status: Home Health | Payer: Self-pay | Source: Ambulatory Visit | Attending: Orthopedic Surgery

## 2016-07-25 ENCOUNTER — Inpatient Hospital Stay
Admission: RE | Admit: 2016-07-25 | Discharge: 2016-07-27 | DRG: 470 | Disposition: A | Discharge status: Home Health | Payer: Medicare Other | Source: Ambulatory Visit | Attending: Orthopedic Surgery | Admitting: Orthopedic Surgery

## 2016-07-25 DIAGNOSIS — R262 Difficulty in walking, not elsewhere classified: Secondary | ICD-10-CM

## 2016-07-25 DIAGNOSIS — I10 Essential (primary) hypertension: Secondary | ICD-10-CM | POA: Diagnosis present

## 2016-07-25 DIAGNOSIS — Z853 Personal history of malignant neoplasm of breast: Secondary | ICD-10-CM | POA: Diagnosis not present

## 2016-07-25 DIAGNOSIS — E7800 Pure hypercholesterolemia, unspecified: Secondary | ICD-10-CM | POA: Diagnosis present

## 2016-07-25 DIAGNOSIS — K0889 Other specified disorders of teeth and supporting structures: Secondary | ICD-10-CM | POA: Diagnosis present

## 2016-07-25 DIAGNOSIS — Z87891 Personal history of nicotine dependence: Secondary | ICD-10-CM | POA: Diagnosis not present

## 2016-07-25 DIAGNOSIS — E039 Hypothyroidism, unspecified: Secondary | ICD-10-CM | POA: Diagnosis present

## 2016-07-25 DIAGNOSIS — F419 Anxiety disorder, unspecified: Secondary | ICD-10-CM | POA: Diagnosis present

## 2016-07-25 DIAGNOSIS — G8918 Other acute postprocedural pain: Secondary | ICD-10-CM

## 2016-07-25 DIAGNOSIS — J449 Chronic obstructive pulmonary disease, unspecified: Secondary | ICD-10-CM | POA: Diagnosis present

## 2016-07-25 DIAGNOSIS — Z85828 Personal history of other malignant neoplasm of skin: Secondary | ICD-10-CM

## 2016-07-25 DIAGNOSIS — M1612 Unilateral primary osteoarthritis, left hip: Principal | ICD-10-CM | POA: Diagnosis present

## 2016-07-25 DIAGNOSIS — K219 Gastro-esophageal reflux disease without esophagitis: Secondary | ICD-10-CM | POA: Diagnosis present

## 2016-07-25 DIAGNOSIS — Z419 Encounter for procedure for purposes other than remedying health state, unspecified: Secondary | ICD-10-CM

## 2016-07-25 DIAGNOSIS — M6281 Muscle weakness (generalized): Secondary | ICD-10-CM

## 2016-07-25 HISTORY — PX: TOTAL HIP ARTHROPLASTY: SHX124

## 2016-07-25 LAB — CBC
HEMATOCRIT: 35.6 % (ref 35.0–47.0)
Hemoglobin: 12 g/dL (ref 12.0–16.0)
MCH: 30.1 pg (ref 26.0–34.0)
MCHC: 33.7 g/dL (ref 32.0–36.0)
MCV: 89.4 fL (ref 80.0–100.0)
PLATELETS: 214 10*3/uL (ref 150–440)
RBC: 3.98 MIL/uL (ref 3.80–5.20)
RDW: 14 % (ref 11.5–14.5)
WBC: 10.7 10*3/uL (ref 3.6–11.0)

## 2016-07-25 LAB — CREATININE, SERUM: Creatinine, Ser: 0.9 mg/dL (ref 0.44–1.00)

## 2016-07-25 SURGERY — ARTHROPLASTY, HIP, TOTAL, ANTERIOR APPROACH
Anesthesia: General | Laterality: Left

## 2016-07-25 MED ORDER — MAGNESIUM CITRATE PO SOLN
1.0000 | Freq: Once | ORAL | Status: DC | PRN
Start: 2016-07-25 — End: 2016-07-27

## 2016-07-25 MED ORDER — BISACODYL 10 MG RE SUPP
10.0000 mg | Freq: Every day | RECTAL | Status: DC | PRN
  Administered 2016-07-27: 10 mg via RECTAL
  Filled 2016-07-25: qty 1

## 2016-07-25 MED ORDER — LORATADINE 10 MG PO TABS
10.0000 mg | ORAL_TABLET | Freq: Every day | ORAL | Status: DC
  Administered 2016-07-26 – 2016-07-27 (×2): 10 mg via ORAL
  Filled 2016-07-25 (×2): qty 1

## 2016-07-25 MED ORDER — PROPOFOL 10 MG/ML IV BOLUS
INTRAVENOUS | Status: DC | PRN
  Administered 2016-07-25: 100 mg via INTRAVENOUS

## 2016-07-25 MED ORDER — PANTOPRAZOLE SODIUM 40 MG PO TBEC
40.0000 mg | DELAYED_RELEASE_TABLET | Freq: Every day | ORAL | Status: DC
  Administered 2016-07-26 – 2016-07-27 (×2): 40 mg via ORAL
  Filled 2016-07-25 (×2): qty 1

## 2016-07-25 MED ORDER — ADULT MULTIVITAMIN W/MINERALS CH
1.0000 | ORAL_TABLET | Freq: Every morning | ORAL | Status: DC
  Administered 2016-07-27: 1 via ORAL
  Filled 2016-07-25 (×2): qty 1

## 2016-07-25 MED ORDER — MORPHINE SULFATE (PF) 2 MG/ML IV SOLN: 2.0000 mg | INTRAVENOUS | Status: DC | PRN

## 2016-07-25 MED ORDER — LACTATED RINGERS IV SOLN: INTRAVENOUS | Status: DC

## 2016-07-25 MED ORDER — BUPIVACAINE-EPINEPHRINE 0.25% -1:200000 IJ SOLN
INTRAMUSCULAR | Status: DC | PRN
  Administered 2016-07-25: 30 mL

## 2016-07-25 MED ORDER — ACETAMINOPHEN 650 MG RE SUPP: 650.0000 mg | Freq: Four times a day (QID) | RECTAL | Status: DC | PRN

## 2016-07-25 MED ORDER — FENTANYL CITRATE (PF) 100 MCG/2ML IJ SOLN
INTRAMUSCULAR | Status: DC | PRN
  Administered 2016-07-25: 150 ug via INTRAVENOUS
  Administered 2016-07-25 (×2): 50 ug via INTRAVENOUS

## 2016-07-25 MED ORDER — ALPRAZOLAM 0.5 MG PO TABS
0.5000 mg | ORAL_TABLET | Freq: Every evening | ORAL | Status: DC | PRN
  Filled 2016-07-25: qty 1

## 2016-07-25 MED ORDER — DOCUSATE SODIUM 100 MG PO CAPS
100.0000 mg | ORAL_CAPSULE | Freq: Two times a day (BID) | ORAL | Status: DC
Start: 2016-07-25 — End: 2016-07-27
  Administered 2016-07-25 – 2016-07-27 (×3): 100 mg via ORAL
  Filled 2016-07-25 (×4): qty 1

## 2016-07-25 MED ORDER — ROCURONIUM BROMIDE 100 MG/10ML IV SOLN
INTRAVENOUS | Status: DC | PRN
  Administered 2016-07-25: 30 mg via INTRAVENOUS
  Administered 2016-07-25: 20 mg via INTRAVENOUS

## 2016-07-25 MED ORDER — FENTANYL CITRATE (PF) 100 MCG/2ML IJ SOLN
INTRAMUSCULAR | Status: AC
  Administered 2016-07-25: 25 ug via INTRAVENOUS
  Filled 2016-07-25: qty 2

## 2016-07-25 MED ORDER — TRANEXAMIC ACID 1000 MG/10ML IV SOLN
1000.0000 mg | INTRAVENOUS | Status: AC
  Administered 2016-07-25: 1000 mg via INTRAVENOUS
  Filled 2016-07-25: qty 10

## 2016-07-25 MED ORDER — LEVOTHYROXINE SODIUM 100 MCG PO TABS
100.0000 ug | ORAL_TABLET | Freq: Every day | ORAL | Status: DC
  Administered 2016-07-27: 100 ug via ORAL
  Filled 2016-07-25: qty 1

## 2016-07-25 MED ORDER — CEFAZOLIN SODIUM-DEXTROSE 2-4 GM/100ML-% IV SOLN
INTRAVENOUS | Status: AC
  Filled 2016-07-25: qty 100

## 2016-07-25 MED ORDER — ENOXAPARIN SODIUM 40 MG/0.4ML ~~LOC~~ SOLN
40.0000 mg | SUBCUTANEOUS | Status: DC
  Administered 2016-07-26 – 2016-07-27 (×2): 40 mg via SUBCUTANEOUS
  Filled 2016-07-25 (×2): qty 0.4

## 2016-07-25 MED ORDER — LETROZOLE 2.5 MG PO TABS
2.5000 mg | ORAL_TABLET | Freq: Every day | ORAL | Status: DC
  Administered 2016-07-25 – 2016-07-27 (×3): 2.5 mg via ORAL
  Filled 2016-07-25 (×3): qty 1

## 2016-07-25 MED ORDER — ONDANSETRON HCL 4 MG/2ML IJ SOLN: 4.0000 mg | Freq: Four times a day (QID) | INTRAMUSCULAR | Status: DC | PRN

## 2016-07-25 MED ORDER — MENTHOL 3 MG MT LOZG: 1.0000 | LOZENGE | OROMUCOSAL | Status: DC | PRN

## 2016-07-25 MED ORDER — DIPHENHYDRAMINE HCL 12.5 MG/5ML PO ELIX: 12.5000 mg | ORAL_SOLUTION | ORAL | Status: DC | PRN

## 2016-07-25 MED ORDER — CITALOPRAM HYDROBROMIDE 20 MG PO TABS
20.0000 mg | ORAL_TABLET | Freq: Every morning | ORAL | Status: DC
  Administered 2016-07-26 – 2016-07-27 (×2): 20 mg via ORAL
  Filled 2016-07-25 (×2): qty 1

## 2016-07-25 MED ORDER — CEFAZOLIN SODIUM-DEXTROSE 2-4 GM/100ML-% IV SOLN
2.0000 g | Freq: Once | INTRAVENOUS | Status: AC
  Administered 2016-07-25: 2 g via INTRAVENOUS

## 2016-07-25 MED ORDER — ACETAMINOPHEN 325 MG PO TABS
650.0000 mg | ORAL_TABLET | Freq: Four times a day (QID) | ORAL | Status: DC | PRN
  Administered 2016-07-25: 650 mg via ORAL
  Filled 2016-07-25: qty 2

## 2016-07-25 MED ORDER — ALUM & MAG HYDROXIDE-SIMETH 200-200-20 MG/5ML PO SUSP: 30.0000 mL | ORAL | Status: DC | PRN

## 2016-07-25 MED ORDER — METOCLOPRAMIDE HCL 5 MG/ML IJ SOLN: 5.0000 mg | Freq: Three times a day (TID) | INTRAMUSCULAR | Status: DC | PRN

## 2016-07-25 MED ORDER — MONTELUKAST SODIUM 10 MG PO TABS
10.0000 mg | ORAL_TABLET | Freq: Every day | ORAL | Status: DC
  Administered 2016-07-25 – 2016-07-26 (×2): 10 mg via ORAL
  Filled 2016-07-25 (×2): qty 1

## 2016-07-25 MED ORDER — METOCLOPRAMIDE HCL 10 MG PO TABS: 5.0000 mg | ORAL_TABLET | Freq: Three times a day (TID) | ORAL | Status: DC | PRN

## 2016-07-25 MED ORDER — SUGAMMADEX SODIUM 200 MG/2ML IV SOLN
INTRAVENOUS | Status: DC | PRN
  Administered 2016-07-25: 130 mg via INTRAVENOUS

## 2016-07-25 MED ORDER — ONDANSETRON HCL 4 MG PO TABS: 4.0000 mg | ORAL_TABLET | Freq: Four times a day (QID) | ORAL | Status: DC | PRN

## 2016-07-25 MED ORDER — SODIUM CHLORIDE 0.9 % IV SOLN
INTRAVENOUS | Status: DC
  Administered 2016-07-25 (×2): via INTRAVENOUS

## 2016-07-25 MED ORDER — ONDANSETRON HCL 4 MG/2ML IJ SOLN
4.0000 mg | Freq: Once | INTRAMUSCULAR | Status: AC | PRN
  Administered 2016-07-25: 4 mg via INTRAVENOUS

## 2016-07-25 MED ORDER — MORPHINE SULFATE (PF) 2 MG/ML IV SOLN
INTRAVENOUS | Status: AC
  Administered 2016-07-25: 2 mg via INTRAMUSCULAR
  Filled 2016-07-25: qty 1

## 2016-07-25 MED ORDER — EPHEDRINE SULFATE 50 MG/ML IJ SOLN
INTRAMUSCULAR | Status: DC | PRN
  Administered 2016-07-25 (×2): 10 mg via INTRAVENOUS

## 2016-07-25 MED ORDER — FLUTICASONE FUROATE-VILANTEROL 100-25 MCG/INH IN AEPB
1.0000 | INHALATION_SPRAY | Freq: Every day | RESPIRATORY_TRACT | Status: DC
  Administered 2016-07-26 – 2016-07-27 (×2): 1 via RESPIRATORY_TRACT
  Filled 2016-07-25: qty 28

## 2016-07-25 MED ORDER — PHENOL 1.4 % MT LIQD: 1.0000 | OROMUCOSAL | Status: DC | PRN

## 2016-07-25 MED ORDER — POLYETHYLENE GLYCOL 3350 17 G PO PACK: 17.0000 g | PACK | Freq: Every day | ORAL | Status: DC | PRN

## 2016-07-25 MED ORDER — ZOLPIDEM TARTRATE 5 MG PO TABS
10.0000 mg | ORAL_TABLET | Freq: Every evening | ORAL | Status: DC | PRN
  Administered 2016-07-25 – 2016-07-26 (×2): 10 mg via ORAL
  Filled 2016-07-25 (×2): qty 2

## 2016-07-25 MED ORDER — PRAVASTATIN SODIUM 40 MG PO TABS
40.0000 mg | ORAL_TABLET | Freq: Every day | ORAL | Status: DC
  Administered 2016-07-25 – 2016-07-26 (×2): 40 mg via ORAL
  Filled 2016-07-25 (×2): qty 1

## 2016-07-25 MED ORDER — BUPIVACAINE-EPINEPHRINE (PF) 0.25% -1:200000 IJ SOLN
INTRAMUSCULAR | Status: AC
  Filled 2016-07-25: qty 30

## 2016-07-25 MED ORDER — LACTATED RINGERS IV SOLN
INTRAVENOUS | Status: DC
  Administered 2016-07-25: 07:00:00 via INTRAVENOUS

## 2016-07-25 MED ORDER — NEOMYCIN-POLYMYXIN B GU 40-200000 IR SOLN
Status: DC | PRN
  Administered 2016-07-25: 4 mL

## 2016-07-25 MED ORDER — ONDANSETRON HCL 4 MG/2ML IJ SOLN
INTRAMUSCULAR | Status: AC
  Administered 2016-07-25: 4 mg via INTRAVENOUS
  Filled 2016-07-25: qty 2

## 2016-07-25 MED ORDER — ONDANSETRON HCL 4 MG/2ML IJ SOLN
INTRAMUSCULAR | Status: DC | PRN
  Administered 2016-07-25: 4 mg via INTRAVENOUS

## 2016-07-25 MED ORDER — NICOTINE 14 MG/24HR TD PT24
14.0000 mg | MEDICATED_PATCH | Freq: Every day | TRANSDERMAL | Status: DC
  Administered 2016-07-25 – 2016-07-27 (×3): 14 mg via TRANSDERMAL
  Filled 2016-07-25 (×3): qty 1

## 2016-07-25 MED ORDER — FENTANYL CITRATE (PF) 100 MCG/2ML IJ SOLN
25.0000 ug | INTRAMUSCULAR | Status: AC | PRN
  Administered 2016-07-25 (×6): 25 ug via INTRAVENOUS

## 2016-07-25 MED ORDER — DEXAMETHASONE SODIUM PHOSPHATE 10 MG/ML IJ SOLN
INTRAMUSCULAR | Status: DC | PRN
  Administered 2016-07-25: 10 mg via INTRAVENOUS

## 2016-07-25 MED ORDER — DICYCLOMINE HCL 10 MG PO CAPS
10.0000 mg | ORAL_CAPSULE | Freq: Three times a day (TID) | ORAL | Status: DC | PRN
  Administered 2016-07-27: 10 mg via ORAL
  Filled 2016-07-25: qty 1

## 2016-07-25 MED ORDER — MAGNESIUM HYDROXIDE 400 MG/5ML PO SUSP
30.0000 mL | Freq: Every day | ORAL | Status: DC | PRN
  Administered 2016-07-26: 30 mL via ORAL
  Filled 2016-07-25: qty 30

## 2016-07-25 MED ORDER — NEOMYCIN-POLYMYXIN B GU 40-200000 IR SOLN
Status: AC
  Filled 2016-07-25: qty 4

## 2016-07-25 MED ORDER — HYDROCODONE-ACETAMINOPHEN 10-325 MG PO TABS
1.0000 | ORAL_TABLET | ORAL | Status: DC | PRN
  Administered 2016-07-25: 2 via ORAL
  Administered 2016-07-25 (×2): 1 via ORAL
  Administered 2016-07-26 – 2016-07-27 (×5): 2 via ORAL
  Administered 2016-07-27: 1 via ORAL
  Filled 2016-07-25: qty 1
  Filled 2016-07-25 (×6): qty 2
  Filled 2016-07-25: qty 1
  Filled 2016-07-25: qty 2

## 2016-07-25 SURGICAL SUPPLY — 52 items
BLADE SAW SAG 18.5X105 (BLADE) ×3 IMPLANT
BNDG COHESIVE 6X5 TAN STRL LF (GAUZE/BANDAGES/DRESSINGS) ×6 IMPLANT
CANISTER SUCT 1200ML W/VALVE (MISCELLANEOUS) ×3 IMPLANT
CAPT HIP TOTAL 3 ×3 IMPLANT
CATH FOL LEG HOLDER (MISCELLANEOUS) ×3 IMPLANT
CATH TRAY METER 16FR LF (MISCELLANEOUS) ×3 IMPLANT
CHLORAPREP W/TINT 26ML (MISCELLANEOUS) ×3 IMPLANT
DRAPE C-ARM XRAY 36X54 (DRAPES) ×3 IMPLANT
DRAPE INCISE IOBAN 66X60 STRL (DRAPES) IMPLANT
DRAPE POUCH INSTRU U-SHP 10X18 (DRAPES) IMPLANT
DRAPE SHEET LG 3/4 BI-LAMINATE (DRAPES) ×9 IMPLANT
DRAPE STERI IOBAN 125X83 (DRAPES) ×3 IMPLANT
DRAPE TABLE BACK 80X90 (DRAPES) ×3 IMPLANT
DRSG OPSITE POSTOP 4X8 (GAUZE/BANDAGES/DRESSINGS) ×6 IMPLANT
ELECT BLADE 6.5 EXT (BLADE) ×3 IMPLANT
ELECT REM PT RETURN 9FT ADLT (ELECTROSURGICAL) ×3
ELECTRODE REM PT RTRN 9FT ADLT (ELECTROSURGICAL) ×1 IMPLANT
GAUZE SPONGE 4X4 12PLY STRL (GAUZE/BANDAGES/DRESSINGS) ×3 IMPLANT
GLOVE BIOGEL PI IND STRL 9 (GLOVE) ×1 IMPLANT
GLOVE BIOGEL PI INDICATOR 9 (GLOVE) ×2
GLOVE SURG SYN 9.0  PF PI (GLOVE) ×4
GLOVE SURG SYN 9.0 PF PI (GLOVE) ×2 IMPLANT
GOWN SRG 2XL LVL 4 RGLN SLV (GOWNS) ×1 IMPLANT
GOWN STRL NON-REIN 2XL LVL4 (GOWNS) ×2
GOWN STRL REUS W/ TWL LRG LVL3 (GOWN DISPOSABLE) ×1 IMPLANT
GOWN STRL REUS W/TWL LRG LVL3 (GOWN DISPOSABLE) ×2
HEMOVAC 400CC 10FR (MISCELLANEOUS) IMPLANT
HIP DBL LINER 54X28 (Liner) ×3 IMPLANT
HIP FEM HD L 28 (Head) ×3 IMPLANT
HIP STEM FEM 1 STD 0118231 (Stem) ×3 IMPLANT
HOOD PEEL AWAY FLYTE STAYCOOL (MISCELLANEOUS) ×3 IMPLANT
MAT BLUE FLOOR 46X72 FLO (MISCELLANEOUS) ×3 IMPLANT
NDL SAFETY 18GX1.5 (NEEDLE) ×3 IMPLANT
NEEDLE SPNL 18GX3.5 QUINCKE PK (NEEDLE) ×3 IMPLANT
NS IRRIG 1000ML POUR BTL (IV SOLUTION) ×3 IMPLANT
PACK HIP COMPR (MISCELLANEOUS) ×3 IMPLANT
SHELL ACETABULAR SZ 54 DM (Shell) ×3 IMPLANT
SOL PREP PVP 2OZ (MISCELLANEOUS) ×3
SOLUTION PREP PVP 2OZ (MISCELLANEOUS) ×1 IMPLANT
STAPLER SKIN PROX 35W (STAPLE) ×3 IMPLANT
STRAP SAFETY BODY (MISCELLANEOUS) ×3 IMPLANT
SUT DVC 2 QUILL PDO  T11 36X36 (SUTURE) ×2
SUT DVC 2 QUILL PDO T11 36X36 (SUTURE) ×1 IMPLANT
SUT DVC QUILL MONODERM 30X30 (SUTURE) ×3 IMPLANT
SUT SILK 0 (SUTURE) ×2
SUT SILK 0 30XBRD TIE 6 (SUTURE) ×1 IMPLANT
SUT VIC AB 1 CT1 36 (SUTURE) ×3 IMPLANT
SYR 20CC LL (SYRINGE) ×3 IMPLANT
SYR 30ML LL (SYRINGE) ×3 IMPLANT
TAPE MICROFOAM 4IN (TAPE) IMPLANT
TOWEL OR 17X26 4PK STRL BLUE (TOWEL DISPOSABLE) ×3 IMPLANT
TUBE KAMVAC SUCTION (TUBING) IMPLANT

## 2016-07-25 NOTE — Progress Notes (Signed)
Patient was admitted to room 158 from surgery via room bed.TEDS, foot pumps, Foley, NSL, and prevena wound vac in place. Bed alarm on for safety. Pain 6/10. IV fluids started and IV pain med given.

## 2016-07-25 NOTE — Anesthesia Procedure Notes (Signed)
Procedure Name: Intubation Date/Time: 07/25/2016 7:35 AM Performed by: Jonna Clark Pre-anesthesia Checklist: Patient identified, Patient being monitored, Timeout performed, Emergency Drugs available and Suction available Patient Re-evaluated:Patient Re-evaluated prior to inductionOxygen Delivery Method: Circle system utilized Preoxygenation: Pre-oxygenation with 100% oxygen Intubation Type: IV induction Ventilation: Mask ventilation without difficulty Laryngoscope Size: Miller and 2 Grade View: Grade I Tube type: Oral Tube size: 7.0 mm Number of attempts: 1 Placement Confirmation: ETT inserted through vocal cords under direct vision,  positive ETCO2 and breath sounds checked- equal and bilateral Secured at: 21 cm Tube secured with: Tape Dental Injury: Teeth and Oropharynx as per pre-operative assessment

## 2016-07-25 NOTE — H&P (Signed)
Reviewed paper H+P, will be scanned into chart. No changes noted.  

## 2016-07-25 NOTE — Transfer of Care (Signed)
Immediate Anesthesia Transfer of Care Note  Patient: Sara David  Procedure(s) Performed: Procedure(s): TOTAL HIP ARTHROPLASTY ANTERIOR APPROACH (Left)  Patient Location: PACU  Anesthesia Type:General  Level of Consciousness: sedated and responds to stimulation  Airway & Oxygen Therapy: Patient Spontanous Breathing and Patient connected to face mask oxygen  Post-op Assessment: Report given to RN and Post -op Vital signs reviewed and stable  Post vital signs: Reviewed and stable  Last Vitals:  Vitals:   07/25/16 0619 07/25/16 0910  BP: (!) 106/31 (!) 134/59  Pulse: 63 68  Resp: 16 (!) 28  Temp: (!) 35.9 C 36.4 C    Last Pain:  Vitals:   07/25/16 0619  TempSrc: Tympanic  PainSc: 4          Complications: No apparent anesthesia complications

## 2016-07-25 NOTE — Progress Notes (Signed)
Patient is A&O x4. Up with assist x1 and walker. Got up to chair for a a short period of time, tolerated well. Gave oral pain meds x2, with good relief. Foley, Prevena wound vac in place to left hip. Bed alarm on for safety.

## 2016-07-25 NOTE — NC FL2 (Signed)
[place] LEVEL OF CARE SCREENING TOOL     IDENTIFICATION  Patient Name: Sara David Birthdate: 04-19-1946 Sex: female Admission Date (Current Location): 07/25/2016  [place] and [place] Number:  Engineering geologist and Address:  [facility], [address], [place], [place] [address]      Provider Number: [id number]  Attending Physician Name and Address:  Hessie Knows, MD  Relative Name and Phone Number:       Current Level of Care: Hospital Recommended Level of Care: [place] Prior Approval Number:    Date Approved/Denied:   PASRR Number:  ([id number])  Discharge Plan: SNF    Current Diagnoses: Patient Active Problem List   Diagnosis Date Noted  . Primary localized osteoarthritis of left hip 07/25/2016  . Left hip pain 06/12/2016  . Acute low back pain 04/02/2016  . Asthma without status asthmaticus 10/17/2015  . Malignant neoplasm of upper-outer quadrant of right female breast ([place]) 10/17/2015  . Carpal tunnel syndrome 10/17/2015  . Cervical disc disease 10/17/2015  . Cervical pain 10/17/2015  . Benign essential HTN 10/17/2015  . Acid reflux 10/17/2015  . Combined fat and carbohydrate induced hyperlipemia 10/17/2015  . Adult hypothyroidism 10/17/2015  . Adaptive colitis 10/17/2015  . Primary osteoarthritis of right hip 10/03/2015  . Small bowel obstruction 01/30/2015  . B12 deficiency 01/30/2015  . Gastroesophageal reflux disease 01/30/2015  . IBS (irritable bowel syndrome) 01/30/2015  . Asthma 01/30/2015  . Hypothyroidism 01/30/2015  . FH: cholecystectomy 01/30/2015  . Breast cancer metastasized to multiple sites ([place]) 01/30/2015  . Bloodgood disease 05/30/2014  . Post menopausal syndrome 05/30/2014  . Raynaud's syndrome without gangrene 05/30/2014  . Chronic bronchitis ([place]) 01/26/2014    Orientation RESPIRATION BLADDER Height & Weight     Self, Time, Situation, Place  O2 (2 Liters Oxygen )  Continent Weight: 150 lb 12.8 oz (68.4 kg) Height:  5\' 4"  (162.6 cm)  BEHAVIORAL SYMPTOMS/MOOD NEUROLOGICAL BOWEL NUTRITION STATUS   (none)  (none) Continent Diet (Diet: Clear Liquid to be advanced. (Please see D/C Summary for updated diet.) )  AMBULATORY STATUS COMMUNICATION OF NEEDS Skin   Extensive Assist Verbally Surgical wounds (Incision: Left Hip. )                       Personal Care Assistance Level of Assistance  Bathing, Feeding, Dressing Bathing Assistance: Limited assistance Feeding assistance: Independent Dressing Assistance: Limited assistance     Functional Limitations Info  Sight, Hearing, Speech Sight Info: Adequate Hearing Info: Adequate Speech Info: Adequate    SPECIAL CARE FACTORS FREQUENCY  PT (By licensed PT), OT (By licensed OT)     PT Frequency:  (5) OT Frequency:  (5)            Contractures      Additional Factors Info  Code Status, Allergies Code Status Info:  (DNR ) Allergies Info:  (Carafate Sucralfate, Diclofenac Sodium, Erythromycin, Adhesive Tape, Wellbutrin Bupropion)           Current Medications (07/25/2016):  This is the current hospital active medication list Current Facility-Administered Medications  Medication Dose Route Frequency Provider Last Rate Last Dose  . 0.9 %  sodium chloride infusion   Intravenous Continuous Hessie Knows, MD 75 mL/hr at 07/25/16 1000    . acetaminophen (TYLENOL) tablet 650 mg  650 mg Oral Q6H PRN Hessie Knows, MD       Or  . acetaminophen (TYLENOL) suppository 650 mg  650  mg Rectal Q6H PRN Hessie Knows, MD      . ALPRAZolam Duanne Moron) tablet 0.5 mg  0.5 mg Oral QHS PRN Hessie Knows, MD      . alum & mag hydroxide-simeth (MAALOX/MYLANTA) 200-200-20 MG/5ML suspension 30 mL  30 mL Oral Q4H PRN Hessie Knows, MD      . bisacodyl (DULCOLAX) suppository 10 mg  10 mg Rectal Daily PRN Hessie Knows, MD      . Derrill Memo ON 07/26/2016] citalopram (CELEXA) tablet 20 mg  20 mg Oral q morning - 10a Hessie Knows, MD       . dicyclomine (BENTYL) capsule 10 mg  10 mg Oral TID PRN Hessie Knows, MD      . diphenhydrAMINE (BENADRYL) 12.5 MG/5ML elixir 12.5-25 mg  12.5-25 mg Oral Q4H PRN Hessie Knows, MD      . docusate sodium (COLACE) capsule 100 mg  100 mg Oral BID Hessie Knows, MD      . Derrill Memo ON 07/26/2016] enoxaparin (LOVENOX) injection 40 mg  40 mg Subcutaneous Q24H Hessie Knows, MD      . Derrill Memo ON 07/26/2016] fluticasone furoate-vilanterol (BREO ELLIPTA) 100-25 MCG/INH 1 puff  1 puff Inhalation Daily Hessie Knows, MD      . HYDROcodone-acetaminophen [facility]) 10-325 MG per tablet 1-2 tablet  1-2 tablet Oral Q4H PRN Hessie Knows, MD   1 tablet at 07/25/16 1208  . lactated ringers infusion   Intravenous Continuous Molli Barrows, MD 100 mL/hr at 07/25/16 0636    . letrozole [facility]) tablet 2.5 mg  2.5 mg Oral Daily Hessie Knows, MD   2.5 mg at 07/25/16 1208  . [START ON 07/26/2016] levothyroxine (SYNTHROID, LEVOTHROID) tablet 100 mcg  100 mcg Oral QAC breakfast Hessie Knows, MD      . loratadine (CLARITIN) tablet 10 mg  10 mg Oral Daily Hessie Knows, MD      . magnesium citrate solution 1 Bottle  1 Bottle Oral Once PRN Hessie Knows, MD      . magnesium hydroxide (MILK OF MAGNESIA) suspension 30 mL  30 mL Oral Daily PRN Hessie Knows, MD      . menthol-cetylpyridinium (CEPACOL) lozenge 3 mg  1 lozenge Oral PRN Hessie Knows, MD       Or  . phenol (CHLORASEPTIC) mouth spray 1 spray  1 spray Mouth/Throat PRN Hessie Knows, MD      . metoCLOPramide (REGLAN) tablet 5-10 mg  5-10 mg Oral Q8H PRN Hessie Knows, MD       Or  . metoCLOPramide (REGLAN) injection 5-10 mg  5-10 mg Intravenous Q8H PRN Hessie Knows, MD      . montelukast (SINGULAIR) tablet 10 mg  10 mg Oral QHS Hessie Knows, MD      . morphine 2 MG/ML injection 2 mg  2 mg Intravenous Q2H PRN Hessie Knows, MD      . multivitamin with minerals tablet 1 tablet  1 tablet Oral q morning - 10a Hessie Knows, MD      . nicotine (NICODERM CQ - dosed in mg/24 hours) patch 14  mg  14 mg Transdermal Daily Hessie Knows, MD   14 mg at 07/25/16 1208  . ondansetron (ZOFRAN) tablet 4 mg  4 mg Oral Q6H PRN Hessie Knows, MD       Or  . ondansetron [facility]) injection 4 mg  4 mg Intravenous Q6H PRN Hessie Knows, MD      . Derrill Memo ON 07/26/2016] pantoprazole (PROTONIX) EC tablet 40 mg  40 mg Oral  Daily Hessie Knows, MD      . polyethylene glycol [facility] / GLYCOLAX) packet 17 g  17 g Oral Daily PRN Hessie Knows, MD      . pravastatin (PRAVACHOL) tablet 40 mg  40 mg Oral QHS Hessie Knows, MD         Discharge Medications: Please see discharge summary for a list of discharge medications.  Relevant Imaging Results:  Relevant Lab Results:   Additional Information  (SSN: [id number])  Reyli Schroth, Veronia Beets, LCSW

## 2016-07-25 NOTE — Op Note (Signed)
07/25/2016  9:11 AM  PATIENT:  Sara David  70 y.o. female  PRE-OPERATIVE DIAGNOSIS:  PRIMARY OSTEOARTHRITIS LEFT HIP  POST-OPERATIVE DIAGNOSIS:  PRIMARY OSTEOARTHRITIS LEFT HIP  PROCEDURE:  Procedure(s): TOTAL HIP ARTHROPLASTY ANTERIOR APPROACH (Left)  SURGEON: Laurene Footman, MD  ASSISTANTS: None  ANESTHESIA:   general  EBL:  Total I/O In: -  Out: 700 [Urine:200; Blood:500]  BLOOD ADMINISTERED:none  DRAINS: none   LOCAL MEDICATIONS USED:  MARCAINE     SPECIMEN:  Source of Specimen:  Left femoral head  DISPOSITION OF SPECIMEN:  PATHOLOGY  COUNTS:  YES  TOURNIQUET:  * No tourniquets in log *  IMPLANTS: Medacta AMIS 1 standard femur, 54 mm Mpact DM cup and liner with L 28 mm head  DICTATION: .Dragon Dictation   The patient was brought to the operating room and after general anesthesia was obtained patient was placed on the operative table with the ipsilateral foot into the Medacta attachment, contralateral leg on a well-padded table. C-arm was brought in and preop template x-ray taken. After prepping and draping in usual sterile fashion appropriate patient identification and timeout procedures were completed. Anterior approach to the hip was obtained and centered over the greater trochanter and TFL muscle. The subcutaneous tissue was incised hemostasis being achieved by electrocautery. TFL fascia was incised and the muscle retracted laterally deep retractor placed. The lateral femoral circumflex vessels were identified and ligated. The anterior capsule was exposed and a capsulotomy performed. The neck was identified and a femoral neck cut carried out with a saw. The head was removed without difficulty and showed sclerotic femoral head and acetabulum. Reaming was carried out to 52 mm and a 54 mm cup trial gave appropriate tightness to the acetabular component a 54 DM cup was impacted into position. The leg was then externally rotated and ischiofemoral and pubofemoral releases  carried out. The femur was sequentially broached to a size 1, size 1 standard and S head trials were placed and the final components chosen. The 1 standard stem was inserted along with a L 28 mm head and 54 mm liner. The hip was reduced and was stable the wound was thoroughly irrigated. The deep fascia was closed using a heavy Quill after infiltration of 30 cc of quarter percent Sensorcaine with epinephrine. 3-0V-loc subcutaneous changes closure followed by skin staples and Xeroform with incisional wound VAC  PLAN OF CARE: Admit to inpatient

## 2016-07-25 NOTE — Anesthesia Preprocedure Evaluation (Signed)
Anesthesia Evaluation  Patient identified by MRN, date of birth, ID band Patient awake    Reviewed: Allergy & Precautions, H&P , NPO status , Patient's Chart, lab work & pertinent test results, reviewed documented beta blocker date and time   History of Anesthesia Complications (+) Family history of anesthesia reaction and history of anesthetic complications  Airway Mallampati: II  TM Distance: >3 FB Neck ROM: full    Dental  (+) Caps, Poor Dentition   Pulmonary neg shortness of breath, asthma , neg sleep apnea, COPD,  COPD inhaler, neg recent URI, Current Smoker,    Pulmonary exam normal breath sounds clear to auscultation + decreased breath sounds      Cardiovascular Exercise Tolerance: Good hypertension, (-) angina(-) CAD, (-) Past MI, (-) Cardiac Stents and (-) CABG Normal cardiovascular exam(-) dysrhythmias (-) Valvular Problems/Murmurs Rhythm:regular Rate:Normal     Neuro/Psych negative neurological ROS  negative psych ROS   GI/Hepatic Neg liver ROS, GERD  ,  Endo/Other  neg diabetesHypothyroidism   Renal/GU negative Renal ROS  negative genitourinary   Musculoskeletal   Abdominal   Peds  Hematology negative hematology ROS (+)   Anesthesia Other Findings Past Medical History: No date: Anxiety No date: Arthritis     Comment: "back" (04/03/2016) 2012 : Breast cancer ([place])     Comment: no lumpectomy. Treating with meds- Right No date: Cancer [facility])     Comment: small intestines 2012, gallbladder 2016 No date: Chronic lower back pain No date: COPD (chronic obstructive pulmonary disease) (*     Comment: "use inhalers for COPD that dx'd at one time"               (04/03/2016) No date: Family history of adverse reaction to anesthes*     Comment: daughter gets nauseated No date: GERD (gastroesophageal reflux disease) No date: High cholesterol No date: Hypertension No date: Hypothyroidism No date: Skin cancer   Comment: "right neck; mid forehead"   Reproductive/Obstetrics negative OB ROS                             Anesthesia Physical Anesthesia Plan  ASA: III  Anesthesia Plan: General   Post-op Pain Management:    Induction:   Airway Management Planned:   Additional Equipment:   Intra-op Plan:   Post-operative Plan:   Informed Consent: I have reviewed the patients History and Physical, chart, labs and discussed the procedure including the risks, benefits and alternatives for the proposed anesthesia with the patient or authorized representative who has indicated his/her understanding and acceptance.   Dental Advisory Given  Plan Discussed with: Anesthesiologist, CRNA and Surgeon  Anesthesia Plan Comments:         Anesthesia Quick Evaluation

## 2016-07-25 NOTE — Evaluation (Signed)
Physical Therapy Evaluation Patient Details Name: Sara David MRN: [id number] DOB: [birth year] Today's Date: 07/25/2016   History of Present Illness  70 y/o female s/p L anterior hip replacement (12/7)  Clinical Impression  Pt was able to participate well with POD0 PT exam.  She had expected pain and hesitation, but overall was able to do bed mobility and get to standing w/o direct assist and was able to walk ~15 ft.  She struggled to take a lot of weight through the L LE but was safe and able to take a few steps and get to the recliner.  Pt's O2 remained in the high 90s on room air t/o the effort and HR was in the 70s the entire time. Pt did well with ~10 minutes of exercises apart from the exam and reports that the leg is moving much better than her R hip after that replacement in February.    Follow Up Recommendations Home health PT (per continued improvement)    Equipment Recommendations  None recommended by PT    Recommendations for Other Services       Precautions / Restrictions Precautions Precautions: Fall;Anterior Hip Restrictions Weight Bearing Restrictions: Yes LLE Weight Bearing: Weight bearing as tolerated      Mobility  Bed Mobility Overal bed mobility: Modified Independent             General bed mobility comments: Pt is slow with getting to EOB, minimal hand rail use   Transfers Overall transfer level: Modified independent Equipment used: Rolling walker (2 wheeled)             General transfer comment: Pt is able to rise to standing without direct assist, she did need a lot of cuing for set up and encouragement, but overall did well.  Ambulation/Gait Ambulation/Gait assistance: Min guard Ambulation Distance (Feet): 15 Feet Assistive device: Rolling walker (2 wheeled)       General Gait Details: Pt is able to ambulate w/o LOBs and good general safety.  She has hesistant to take a lot of weight through the R LE and was reliant on the walker.  She  did state that secondary to pain (no buckling) she could not trust the R knee  Stairs            Wheelchair Mobility    Modified Rankin (Stroke Patients Only)       Balance                                             Pertinent Vitals/Pain Pain Assessment: 0-10 Pain Score: 5     Home Living Family/patient expects to be discharged to:: Private residence Living Arrangements: Spouse/significant other;Children;Parent Available Help at Discharge: Family;Available 24 hours/day Type of Home: House Home Access: Stairs to enter Entrance Stairs-Rails:  (post/column to hold onto) Entrance Stairs-Number of Steps: 2 Home Layout: One level Home Equipment: Hand held shower head;Tub bench;Cane - single point;Walker - 4 wheels;Walker - 2 wheels      Prior Function Level of Independence: Independent with assistive device(s)         Comments: Pt has been needing to use SPC secondary to L hip pain     Hand Dominance        Extremity/Trunk Assessment   Upper Extremity Assessment: Overall WFL for tasks assessed           Lower Extremity  Assessment: LLE deficits/detail   LLE Deficits / Details: expected post-op weakness, at least 3/5 overall except inability to do SLR     Communication   Communication: No difficulties  Cognition Arousal/Alertness: Awake/alert Behavior During Therapy: WFL for tasks assessed/performed Overall Cognitive Status: Within Functional Limits for tasks assessed                      General Comments      Exercises Total Joint Exercises Ankle Circles/Pumps: AROM;10 reps Quad Sets: Strengthening;10 reps Gluteal Sets: Strengthening;10 reps Short Arc Quad: Strengthening;10 reps Heel Slides: AROM;10 reps Hip ABduction/ADduction: AROM;10 reps   Assessment/Plan    PT Assessment Patient needs continued PT services  PT Problem List Decreased strength;Decreased range of motion;Decreased activity tolerance;Decreased  balance;Decreased mobility;Decreased coordination;Decreased safety awareness;Pain;Decreased knowledge of use of DME;Decreased cognition          PT Treatment Interventions DME instruction;Gait training;Stair training;Functional mobility training;Therapeutic activities;Therapeutic exercise;Balance training;Neuromuscular re-education;Patient/family education    PT Goals (Current goals can be found in the Care Plan section)  Acute Rehab PT Goals Patient Stated Goal: go home PT Goal Formulation: With patient Time For Goal Achievement: 08/08/16 Potential to Achieve Goals: Good    Frequency BID   Barriers to discharge        Co-evaluation               End of Session Equipment Utilized During Treatment: Gait belt Activity Tolerance: Patient limited by fatigue;Patient limited by pain Patient left: with chair alarm set;with call bell/phone within reach;with nursing/sitter in room Nurse Communication: Mobility status         Time: [id number] PT Time Calculation (min) (ACUTE ONLY): 30 min   Charges:   PT Evaluation $PT Eval Low Complexity: 1 Procedure PT Treatments $Therapeutic Exercise: 8-22 mins   PT G Codes:        Kreg Shropshire, DPT 07/25/2016, 4:49 PM

## 2016-07-26 ENCOUNTER — Encounter: Payer: Self-pay | Admitting: Orthopedic Surgery

## 2016-07-26 LAB — BASIC METABOLIC PANEL
Anion gap: 7 (ref 5–15)
BUN: 13 mg/dL (ref 6–20)
CHLORIDE: 106 mmol/L (ref 101–111)
CO2: 25 mmol/L (ref 22–32)
Calcium: 8.3 mg/dL — ABNORMAL LOW (ref 8.9–10.3)
Creatinine, Ser: 0.79 mg/dL (ref 0.44–1.00)
GFR calc non Af Amer: >60 mL/min (ref >60)
Glucose, Bld: 110 mg/dL — ABNORMAL HIGH (ref 65–99)
Potassium: 4 mmol/L (ref 3.5–5.1)
SODIUM: 138 mmol/L (ref 135–145)

## 2016-07-26 NOTE — Evaluation (Signed)
Occupational Therapy Evaluation Patient Details Name: Sara David MRN: [id number] DOB: 07-May-1946 Today's Date: 07/26/2016    History of Present Illness Pt. is a 70 y.o. female who was admitted to [facility] with a Left Anterior Hip Replacement.   Clinical Impression   Pt. is a 70 y.o. female who was admitted to [facility] with a left Anterior Hip Replacement. Pt. and husband report pt. has equipment in place from a previous right hip replacement. Pt. Reports having everything in place for meals, and IADL tasks at this time. Pt. plans to return home with husband assist upon discharge. No further OT services are warranted at this time.     Follow Up Recommendations  No OT follow up    Equipment Recommendations       Recommendations for Other Services       Precautions / Restrictions Precautions Precautions: Fall;Anterior Hip Restrictions Weight Bearing Restrictions: Yes LLE Weight Bearing: Weight bearing as tolerated         Balance Overall balance assessment: Modified Independent                                          ADL Overall ADL's : Needs assistance/impaired                                       General ADL Comments: Pt. edcuation was provided about A/E use for LE ADLs. Pt. reports having several A/E items including a reacher, and Long handled shoe horn, Pt. reports having everything in place at home for meals, and IADLs as pt. has previously had a right Hip replacement.     Vision     Perception     Praxis      Pertinent Vitals/Pain Pain Assessment: 0-10 Pain Score: 4  Pain Location: Left Hip Pain Intervention(s): Limited activity within patient's tolerance;Monitored during session     Hand Dominance Right   Extremity/Trunk Assessment Upper Extremity Assessment Upper Extremity Assessment: Overall WFL for tasks assessed           Communication Communication Communication: No difficulties   Cognition  Arousal/Alertness: Awake/alert Behavior During Therapy: WFL for tasks assessed/performed Overall Cognitive Status: Within Functional Limits for tasks assessed                     General Comments       Exercises   Shoulder Instructions      Home Living Family/patient expects to be discharged to:: Private residence Living Arrangements: Spouse/significant other Available Help at Discharge: Family;Available 24 hours/day Type of Home: House Home Access: Stairs to enter [employer] of Steps: 2   Home Layout: One level     Bathroom Shower/Tub: Curtain   Biochemist, clinical: Handicapped height     Home Equipment: Hand held shower head;Tub bench;Walker - 4 wheels;Walker - 2 wheels          Prior Functioning/Environment Level of Independence: Independent with assistive device(s)                 OT Problem List: Decreased strength;Decreased knowledge of use of DME or AE;Decreased safety awareness;Decreased activity tolerance   OT Treatment/Interventions:      OT Goals(Current goals can be found in the care plan section) Acute Rehab OT Goals Patient Stated Goal: To return home  OT Goal Formulation: With patient Potential to Achieve Goals: Good  OT Frequency:     Barriers to D/C:            Co-evaluation              End of Session    Activity Tolerance: Patient tolerated treatment well Patient left: in chair;with call bell/phone within reach;with chair alarm set   Time: 0956-1010 OT Time Calculation (min): 14 min Charges:  OT General Charges $OT Visit: 1 Procedure OT Evaluation $OT Eval Low Complexity: 1 Procedure G-Codes:    Harrel Carina, MS, OTR/L 07/26/2016, 11:52 AM

## 2016-07-26 NOTE — Progress Notes (Signed)
Physical Therapy Treatment Patient Details Name: Sara David MRN: [id number] DOB: 08/05/1946 Today's Date: 07/26/2016    History of Present Illness Pt. is a 70 y.o. female who was admitted to [facility] with a Left Anterior Hip Replacement.    PT Comments    Pt does well with PT this afternoon, she needed only light cuing with gait training and was able to increase distance, cadence, speed, step length and overall confidence well.  She did well with resisted supine exercises and though she has some pain she is not functionally limited by it.  Pt eager to work with PT and is hoping to be able to go home tomorrow.   Follow Up Recommendations  Home health PT     Equipment Recommendations  None recommended by PT    Recommendations for Other Services       Precautions / Restrictions Precautions Precautions: Fall;Anterior Hip Restrictions LLE Weight Bearing: Weight bearing as tolerated    Mobility  Bed Mobility Overal bed mobility: Needs Assistance Bed Mobility: Sit to Supine       Sit to supine: Min assist   General bed mobility comments: Pt showed good effort in trying to get LEs back up into bed, but needed assist to get L LE up  Transfers Overall transfer level: Independent Equipment used: Rolling walker (2 wheeled)             General transfer comment: Pt showed good ability to rise to standing and maintain balance with minimal UE use  Ambulation/Gait Ambulation/Gait assistance: Min guard Ambulation Distance (Feet): 150 Feet Assistive device: Rolling walker (2 wheeled)       General Gait Details: Pt shows greatly increased confidence and safety with ambulation and overall did very well.  She had only minimal expected fatigue with the effort and was able to maintain relatively consistent cadence and even was able to maintain forward walker momentum the last 30-40 ft showing good WBing tolerance on the L and improved overall confidence.  No LOBs or safety concerns.     Stairs            Wheelchair Mobility    Modified Rankin (Stroke Patients Only)       Balance Overall balance assessment: Modified Independent                                  Cognition Arousal/Alertness: Awake/alert Behavior During Therapy: WFL for tasks assessed/performed Overall Cognitive Status: Within Functional Limits for tasks assessed                      Exercises Total Joint Exercises Ankle Circles/Pumps: AROM;10 reps Quad Sets: Strengthening;10 reps Gluteal Sets: Strengthening;10 reps Short Arc Quad: Strengthening;15 reps Heel Slides: AROM;10 reps Hip ABduction/ADduction: AROM;10 reps    General Comments        Pertinent Vitals/Pain Pain Score: 5     Home Living                      Prior Function            PT Goals (current goals can now be found in the care plan section) Progress towards PT goals: Progressing toward goals    Frequency    BID      PT Plan Current plan remains appropriate    Co-evaluation             End  of Session Equipment Utilized During Treatment: Gait belt Activity Tolerance: Patient tolerated treatment well Patient left: with call bell/phone within reach;with bed alarm set     Time: [id number] PT Time Calculation (min) (ACUTE ONLY): 31 min  Charges:  $Gait Training: 8-22 mins $Therapeutic Exercise: 8-22 mins                    G Codes:      Kreg Shropshire, DPT 07/26/2016, 3:57 PM

## 2016-07-26 NOTE — Progress Notes (Signed)
   Subjective: 1 Day Post-Op Procedure(s) (LRB): TOTAL HIP ARTHROPLASTY ANTERIOR APPROACH (Left) Patient reports pain as moderate.   Patient is well, but has had some minor complaints of low back pain. Pain improved with sittup up. No radicular symptoms. Denies any CP, SOB, ABD pain. We will continue therapy today.  Plan is to go Home after hospital stay.  Objective: Vital signs in last 24 hours: Temp:  [97.1 F (36.2 C)-99 F (37.2 C)] 98.3 F (36.8 C) (12/08 0737) Pulse Rate:  [68-98] 91 (12/08 0737) Resp:  [0-28] 18 (12/08 0737) BP: (103-152)/(44-79) 117/45 (12/08 0737) SpO2:  [94 %-100 %] 100 % (12/08 0737) FiO2 (%):  [28 %] 28 % (12/07 0926) Weight:  [68.4 kg (150 lb 12.8 oz)] 68.4 kg (150 lb 12.8 oz) (12/07 1043)  Intake/Output from previous day: 12/07 0701 - 12/08 0700 In: 1967.5 [I.V.:1967.5] Out: 3100 [Urine:2600; Blood:500] Intake/Output this shift: No intake/output data recorded.   Recent Labs  07/25/16 1101  HGB 12.0    Recent Labs  07/25/16 1101  WBC 10.7  RBC 3.98  HCT 35.6  PLT 214    Recent Labs  07/25/16 1101  CREATININE 0.90   No results for input(s): LABPT, INR in the last 72 hours.  EXAM General - Patient is Alert, Appropriate and Oriented Extremity - Neurovascular intact Sensation intact distally Intact pulses distally Dorsiflexion/Plantar flexion intact No cellulitis present Compartment soft Dressing - dressing C/D/I and no drainage, wound vac intact Motor Function - intact, moving foot and toes well on exam.   Past Medical History:  Diagnosis Date  . Anxiety   . Arthritis    "back" (04/03/2016)  . Breast cancer ([place]) 2012    no lumpectomy. Treating with meds- Right  . Cancer ([place])    small intestines 2012, gallbladder 2016  . Chronic lower back pain   . COPD (chronic obstructive pulmonary disease) ([place])    "use inhalers for COPD that dx'd at one time" (04/03/2016)  . Family history of adverse reaction to anesthesia    daughter gets nauseated  . GERD (gastroesophageal reflux disease)   . High cholesterol   . Hypertension   . Hypothyroidism   . Skin cancer    "right neck; mid forehead"    Assessment/Plan:   1 Day Post-Op Procedure(s) (LRB): TOTAL HIP ARTHROPLASTY ANTERIOR APPROACH (Left) Active Problems:   Primary localized osteoarthritis of left hip  Estimated body mass index is 25.88 kg/m as calculated from the following:   Height as of this encounter: 5\' 4"  (1.626 m).   Weight as of this encounter: 68.4 kg (150 lb 12.8 oz). Advance diet Up with therapy  Needs BM Recheck labs in the am CM to assist with discharge  DVT Prophylaxis - Lovenox, Foot Pumps and TED hose Weight-Bearing as tolerated to left leg   T. Rachelle Hora, PA-C [place] 07/26/2016, 8:04 AM

## 2016-07-26 NOTE — Progress Notes (Signed)
Physical Therapy Treatment Patient Details Name: Shermaine Heaps MRN: [id number] DOB: 1945-10-28 Today's Date: 07/26/2016    History of Present Illness 70 y/o female s/p L anterior hip replacement (12/7)    PT Comments    Pt eager to work with PT and shows good effort and good overall progression on POD1.  She was able to take weight much more comfortably during ambulation and walked into the hallway with good relative confidence and safety.  She is showing improved AROM and strength with exercises and reports that she is moving much better than she did after her other hip replacement.  Pt pleased with her progression and is motivated t/o the session.    Follow Up Recommendations  Home health PT     Equipment Recommendations  None recommended by PT    Recommendations for Other Services       Precautions / Restrictions Precautions Precautions: Fall;Anterior Hip Restrictions LLE Weight Bearing: Weight bearing as tolerated    Mobility  Bed Mobility Overal bed mobility: Modified Independent             General bed mobility comments: Pt is slow with getting to EOB, minimal hand rail use - getting up to L side of bed (R side yesterday)  Transfers Overall transfer level: Modified independent Equipment used: Rolling walker (2 wheeled)             General transfer comment: Pt needing less cuing and set-up and overall showed good stability and confidence with walker  Ambulation/Gait Ambulation/Gait assistance: Min guard Ambulation Distance (Feet): 50 Feet Assistive device: Rolling walker (2 wheeled)       General Gait Details: Pt much more comfortable taking weigh through L LE today and was able to maintain relatively consistent cadence and more smooth walker use.  She had some minimal fatigue and pain with the effort but ultimately was safe and able appropriate with ability to ambulate per POD1 expectations.    Stairs            Wheelchair Mobility    Modified  [place] (Stroke Patients Only)       Balance Overall balance assessment: Modified Independent                                  Cognition Arousal/Alertness: Awake/alert Behavior During Therapy: WFL for tasks assessed/performed Overall Cognitive Status: Within Functional Limits for tasks assessed                      Exercises Total Joint Exercises Ankle Circles/Pumps: AROM;10 reps Quad Sets: Strengthening;10 reps Gluteal Sets: Strengthening;10 reps Short Arc Quad: Strengthening;15 reps Heel Slides: AROM;10 reps Hip ABduction/ADduction: AROM;10 reps Straight Leg Raises:  (good effort on 2 reps, unable to do AROM, some pain)    General Comments        Pertinent Vitals/Pain Pain Score: 4     Home Living                      Prior Function            PT Goals (current goals can now be found in the care plan section) Progress towards PT goals: Progressing toward goals    Frequency    BID      PT Plan Current plan remains appropriate    Co-evaluation             End  of Session Equipment Utilized During Treatment: Gait belt Activity Tolerance: Patient tolerated treatment well Patient left: with chair alarm set;with call bell/phone within reach;with nursing/sitter in room     Time: 0805-0847 PT Time Calculation (min) (ACUTE ONLY): 42 min  Charges:  $Gait Training: 8-22 mins $Therapeutic Exercise: 23-37 mins                    G Codes:      Kreg Shropshire, DPT 07/26/2016, 9:44 AM

## 2016-07-26 NOTE — Anesthesia Postprocedure Evaluation (Signed)
Anesthesia Post Note  Patient: Amberdawn Rosenburg  Procedure(s) Performed: Procedure(s) (LRB): TOTAL HIP ARTHROPLASTY ANTERIOR APPROACH (Left)  Patient location during evaluation: PACU Anesthesia Type: General Level of consciousness: awake and alert Pain management: pain level controlled Vital Signs Assessment: post-procedure vital signs reviewed and stable Respiratory status: spontaneous breathing, nonlabored ventilation, respiratory function stable and patient connected to nasal cannula oxygen Cardiovascular status: blood pressure returned to baseline and stable Postop Assessment: no signs of nausea or vomiting Anesthetic complications: no    Last Vitals:  Vitals:   07/26/16 0737 07/26/16 1517  BP: (!) 117/45 95/81  Pulse: 91 82  Resp: 18 18  Temp: 36.8 C 36.8 C    Last Pain:  Vitals:   07/26/16 1517  TempSrc: Oral  PainSc:                  Martha Clan

## 2016-07-26 NOTE — Care Management Note (Signed)
Case Management Note  Patient Details  Name: Semya Klinke MRN: [id number] Date of Birth: 1946/03/03  Subjective/Objective:  POD # 1 left THA. Met with patient at bedside to discuss discharge planning. She lives at home with her spouse and mother. Daughter lives about 10 minutes away. PCP is Emily Filbert. Pharmacy is [facility]: [phone]. Called Lovenox 40 mg # 14, no refills .  Offered choice of home health agencies. [facility]. [facility] called to [facility].  Patient has a walker and BSC.                Action/Plan: Lovenox called in. [facility] for HHPT.   Expected Discharge Date:  07/27/16               Expected Discharge Plan:  [place]  In-House Referral:     Discharge planning Services  CM Consult  Post Acute Care Choice:  Home Health Choice offered to:     DME Arranged:    DME Agency:     HH Arranged:  PT [place]:  [place]  Status of Service:  In process, will continue to follow  If discussed at Long Length of Stay Meetings, dates discussed:    Additional Comments:  Jolly Mango, RN 07/26/2016, 9:44 AM

## 2016-07-26 NOTE — Care Management Important Message (Signed)
Important Message  Patient Details  Name: Shellbie Bentley MRN: [id number] Date of Birth: 1946-07-04   Medicare Important Message Given:  Yes    Jolly Mango, RN 07/26/2016, 9:53 AM

## 2016-07-26 NOTE — Progress Notes (Signed)
Clinical Social Worker (CSW) received SNF consult. PT is recommending home health. RN case manager aware of above. Please reconsult if future social work needs arise. CSW signing off.   Brysen Shankman, LCSW [phone] 

## 2016-07-27 LAB — CBC
HEMATOCRIT: 29.1 % — AB (ref 35.0–47.0)
HEMOGLOBIN: 10.2 g/dL — AB (ref 12.0–16.0)
MCH: 31.4 pg (ref 26.0–34.0)
MCHC: 35.1 g/dL (ref 32.0–36.0)
MCV: 89.6 fL (ref 80.0–100.0)
Platelets: 180 10*3/uL (ref 150–440)
RBC: 3.25 MIL/uL — ABNORMAL LOW (ref 3.80–5.20)
RDW: 13.6 % (ref 11.5–14.5)
WBC: 6.2 10*3/uL (ref 3.6–11.0)

## 2016-07-27 LAB — BASIC METABOLIC PANEL
ANION GAP: 5 (ref 5–15)
BUN: 15 mg/dL (ref 6–20)
CHLORIDE: 104 mmol/L (ref 101–111)
CO2: 28 mmol/L (ref 22–32)
Calcium: 8.6 mg/dL — ABNORMAL LOW (ref 8.9–10.3)
Creatinine, Ser: 0.81 mg/dL (ref 0.44–1.00)
GFR calc non Af Amer: >60 mL/min (ref >60)
GLUCOSE: 118 mg/dL — AB (ref 65–99)
Potassium: 3.8 mmol/L (ref 3.5–5.1)
Sodium: 137 mmol/L (ref 135–145)

## 2016-07-27 MED ORDER — ENOXAPARIN SODIUM 40 MG/0.4ML ~~LOC~~ SOLN: 40.0000 mg | SUBCUTANEOUS | 0 refills | Status: DC

## 2016-07-27 MED ORDER — OXYCODONE-ACETAMINOPHEN 10-325 MG PO TABS: 1.0000 | ORAL_TABLET | Freq: Four times a day (QID) | ORAL | 0 refills | Status: DC | PRN

## 2016-07-27 NOTE — Discharge Summary (Signed)
Physician Discharge Summary  Subjective: 2 Days Post-Op Procedure(s) (LRB): TOTAL HIP ARTHROPLASTY ANTERIOR APPROACH (Left) Patient reports pain as mild.   Patient seen in rounds with Dr. Roland Rack. Patient is well, and has had no acute complaints or problems Patient is ready to go Home with home health physical therapy  Physician Discharge Summary  Patient ID: Sara David MRN: [id number] DOB/AGE: 12/18/1945 70 y.o.  Admit date: 07/25/2016 Discharge date: 07/27/2016  Admission Diagnoses:  Discharge Diagnoses:  Active Problems:   Primary localized osteoarthritis of left hip   Discharged Condition: good  Hospital Course: The patient is postop day 2 from a left anterior approach total hip replacement. She is doing very well since surgery. She ambulated 150 feet on postop day 1. She is ready to go home after she has a bowel movement today. Her labs appear to be normal and her vitals are normal.  Treatments: surgery:  TOTAL HIP ARTHROPLASTY ANTERIOR APPROACH (Left)  SURGEON: Laurene Footman, MD  ASSISTANTS: None  ANESTHESIA:   general  EBL:  Total I/O In: -  Out: 700 [Urine:200; Blood:500]  BLOOD ADMINISTERED:none  DRAINS: none   LOCAL MEDICATIONS USED:  MARCAINE     SPECIMEN:  Source of Specimen:  Left femoral head  DISPOSITION OF SPECIMEN:  PATHOLOGY  COUNTS:  YES  TOURNIQUET:  * No tourniquets in log *  IMPLANTS: Medacta AMIS 1 standard femur, 54 mm Mpact DM cup and liner with L 28 mm head  Discharge Exam: Blood pressure 126/72, pulse 80, temperature 98 F (36.7 C), temperature source Oral, resp. rate 18, height 5\' 4"  (1.626 m), weight 68.4 kg (150 lb 12.8 oz), SpO2 95 %.   Disposition: 06-Home-Health Care Svc     Medication List    TAKE these medications   acetaminophen 500 MG tablet Commonly known as:  TYLENOL Take 1,000 mg by mouth daily as needed for mild pain or fever.   ALPRAZolam 0.5 MG tablet Commonly known as:  XANAX Take 1 tablet  (0.5 mg total) by mouth at bedtime as needed for sleep.   CENTRUM SILVER PO Take 1 tablet by mouth every morning.   cetirizine 10 MG tablet Commonly known as:  ZYRTEC Take 10 mg by mouth daily as needed for allergies. Reported on 10/03/2015   citalopram 20 MG tablet Commonly known as:  CELEXA Take 20 mg by mouth every morning.   dicyclomine 10 MG capsule Commonly known as:  BENTYL Take 10 mg by mouth 3 (three) times daily as needed for spasms.   enoxaparin 40 MG/0.4ML injection Commonly known as:  LOVENOX Inject 0.4 mLs (40 mg total) into the skin daily.   Fluticasone-Salmeterol 100-50 MCG/DOSE Aepb Commonly known as:  ADVAIR Inhale 1 puff into the lungs every morning.   letrozole 2.5 MG tablet Commonly known as:  FEMARA TAKE 1 TABLET ONE TIME DAILY   levothyroxine 100 MCG tablet Commonly known as:  SYNTHROID, LEVOTHROID Take 100 mcg by mouth daily before breakfast.   montelukast 10 MG tablet Commonly known as:  SINGULAIR Take 10 mg by mouth at bedtime.   omeprazole 20 MG capsule Commonly known as:  PRILOSEC Take 20 mg by mouth every morning.   oxyCODONE-acetaminophen 10-325 MG tablet Commonly known as:  PERCOCET Take 1 tablet by mouth every 6 (six) hours as needed for pain.   polyethylene glycol packet Commonly known as:  MIRALAX / GLYCOLAX Take 17 g by mouth daily. What changed:  when to take this  reasons to take this  pravastatin 40 MG tablet Commonly known as:  PRAVACHOL Take 40 mg by mouth at bedtime.   zolpidem 10 MG tablet Commonly known as:  AMBIEN Take 1 tablet (10 mg total) by mouth at bedtime as needed. for sleep      Follow-up Information    MENZ,MICHAEL, MD Follow up in 2 week(s).   Specialty:  Orthopedic Surgery Why:  For staple removal Contact information: [place] [address] [phone]           Signed: Prescott Parma, Giancarlos Berendt 07/27/2016, 7:04 AM   Objective: Vital signs in  last 24 hours: Temp:  [98 F (36.7 C)-99 F (37.2 C)] 98 F (36.7 C) (12/09 0334) Pulse Rate:  [80-94] 80 (12/09 0334) Resp:  [18] 18 (12/09 0334) BP: (95-144)/(45-81) 126/72 (12/09 0334) SpO2:  [95 %-100 %] 95 % (12/09 0334)  Intake/Output from previous day:  Intake/Output Summary (Last 24 hours) at 07/27/16 0704 Last data filed at 07/26/16 1751  Gross per 24 hour  Intake              360 ml  Output              200 ml  Net              160 ml    Intake/Output this shift: No intake/output data recorded.  Labs:  Recent Labs  07/25/16 1101 07/27/16 0426  HGB 12.0 10.2*    Recent Labs  07/25/16 1101 07/27/16 0426  WBC 10.7 6.2  RBC 3.98 3.25*  HCT 35.6 29.1*  PLT 214 180    Recent Labs  07/26/16 0838 07/27/16 0426  NA 138 137  K 4.0 3.8  CL 106 104  CO2 25 28  BUN 13 15  CREATININE 0.79 0.81  GLUCOSE 110* 118*  CALCIUM 8.3* 8.6*   No results for input(s): LABPT, INR in the last 72 hours.  EXAM: General - Patient is Alert and Oriented Extremity - Intact pulses distally. Neurological intact. Compartments soft. No cellulitis. Incision - clean, dry, wound VAC in place. Motor Function -  plantarflexion and dorsiflexion of the feet is intact. The patient ambulated 150 feet with physical therapy.  Assessment/Plan: 2 Days Post-Op Procedure(s) (LRB): TOTAL HIP ARTHROPLASTY ANTERIOR APPROACH (Left) Procedure(s) (LRB): TOTAL HIP ARTHROPLASTY ANTERIOR APPROACH (Left) Past Medical History:  Diagnosis Date  . Anxiety   . Arthritis    "back" (04/03/2016)  . Breast cancer ([place]) 2012    no lumpectomy. Treating with meds- Right  . Cancer ([place])    small intestines 2012, gallbladder 2016  . Chronic lower back pain   . COPD (chronic obstructive pulmonary disease) ([place])    "use inhalers for COPD that dx'd at one time" (04/03/2016)  . Family history of adverse reaction to anesthesia    daughter gets nauseated  . GERD (gastroesophageal reflux disease)   . High  cholesterol   . Hypertension   . Hypothyroidism   . Skin cancer    "right neck; mid forehead"   Active Problems:   Primary localized osteoarthritis of left hip  Estimated body mass index is 25.88 kg/m as calculated from the following:   Height as of this encounter: 5\' 4"  (1.626 m).   Weight as of this encounter: 68.4 kg (150 lb 12.8 oz). Up with therapy Discharge home with home health  Bowel management. Diet - Regular diet Follow up - in 2 weeks Activity - WBAT Disposition - Home Condition Upon Discharge -  Good DVT Prophylaxis - Lovenox and TED hose  Reche Dixon, PA-C Orthopaedic Surgery 07/27/2016, 7:04 AM

## 2016-07-27 NOTE — Progress Notes (Signed)
   Subjective: 2 Days Post-Op Procedure(s) (LRB): TOTAL HIP ARTHROPLASTY ANTERIOR APPROACH (Left) Patient reports pain as mild.   Patient is well, but has had some minor complaints of low back pain. Pain improved with sittup up. No radicular symptoms. Denies any CP, SOB, ABD pain. We will continue therapy today.  Plan is to go Home after hospital stay. Plan to go home today.  Objective: Vital signs in last 24 hours: Temp:  [98 F (36.7 C)-99 F (37.2 C)] 98 F (36.7 C) (12/09 0334) Pulse Rate:  [80-94] 80 (12/09 0334) Resp:  [18] 18 (12/09 0334) BP: (95-144)/(45-81) 126/72 (12/09 0334) SpO2:  [95 %-100 %] 95 % (12/09 0334)  Intake/Output from previous day: 12/08 0701 - 12/09 0700 In: 360 [P.O.:360] Out: 200 [Urine:200] Intake/Output this shift: No intake/output data recorded.   Recent Labs  07/25/16 1101 07/27/16 0426  HGB 12.0 10.2*    Recent Labs  07/25/16 1101 07/27/16 0426  WBC 10.7 6.2  RBC 3.98 3.25*  HCT 35.6 29.1*  PLT 214 180    Recent Labs  07/26/16 0838 07/27/16 0426  NA 138 137  K 4.0 3.8  CL 106 104  CO2 25 28  BUN 13 15  CREATININE 0.79 0.81  GLUCOSE 110* 118*  CALCIUM 8.3* 8.6*   No results for input(s): LABPT, INR in the last 72 hours.  EXAM General - Patient is Alert, Appropriate and Oriented Extremity - Neurovascular intact Sensation intact distally Intact pulses distally Dorsiflexion/Plantar flexion intact No cellulitis present Compartment soft Dressing - dressing C/D/I and no drainage, wound vac intact Motor Function - intact, moving foot and toes well on exam. The patient ambulated 150 feet.  Past Medical History:  Diagnosis Date  . Anxiety   . Arthritis    "back" (04/03/2016)  . Breast cancer ([place]) 2012    no lumpectomy. Treating with meds- Right  . Cancer ([place])    small intestines 2012, gallbladder 2016  . Chronic lower back pain   . COPD (chronic obstructive pulmonary disease) ([place])    "use inhalers for COPD  that dx'd at one time" (04/03/2016)  . Family history of adverse reaction to anesthesia    daughter gets nauseated  . GERD (gastroesophageal reflux disease)   . High cholesterol   . Hypertension   . Hypothyroidism   . Skin cancer    "right neck; mid forehead"    Assessment/Plan:   2 Days Post-Op Procedure(s) (LRB): TOTAL HIP ARTHROPLASTY ANTERIOR APPROACH (Left) Active Problems:   Primary localized osteoarthritis of left hip  Estimated body mass index is 25.88 kg/m as calculated from the following:   Height as of this encounter: 5\' 4"  (1.626 m).   Weight as of this encounter: 68.4 kg (150 lb 12.8 oz). Advance diet Up with therapy  Needs BMBefore discharge Plan to discharge home today.  DVT Prophylaxis - Lovenox, Foot Pumps and TED hose Weight-Bearing as tolerated to left leg   Sara Dixon, PA-C [place] 07/27/2016, 6:59 AM

## 2016-07-27 NOTE — Care Management Note (Signed)
Case Management Note  Patient Details  Name: Sara David MRN: [id number] Date of Birth: 06-08-1946  Subjective/Objective:      A referral for home health PT was faxed to [facility].               Action/Plan:   Expected Discharge Date:  07/27/16               Expected Discharge Plan:  [place]  In-House Referral:     Discharge planning Services  CM Consult  Post Acute Care Choice:  Home Health Choice offered to:     DME Arranged:    DME Agency:     HH Arranged:  PT [place]:  [place] [facility]  Status of Service:  In process, will continue to follow  If discussed at Long Length of Stay Meetings, dates discussed:    Additional Comments:  Axel Meas A, RN 07/27/2016, 9:02 AM

## 2016-07-27 NOTE — Discharge Instructions (Signed)
ANTERIOR APPROACH TOTAL HIP REPLACEMENT POSTOPERATIVE DIRECTIONS   Hip Rehabilitation, Guidelines Following Surgery  The results of a hip operation are greatly improved after range of motion and muscle strengthening exercises. Follow all safety measures which are given to protect your hip. If any of these exercises cause increased pain or swelling in your joint, decrease the amount until you are comfortable again. Then slowly increase the exercises. Call your caregiver if you have problems or questions.   HOME CARE INSTRUCTIONS  Remove items at home which could result in a fall. This includes throw rugs or furniture in walking pathways.   ICE to the affected hip every three hours for 30 minutes at a time and then as needed for pain and swelling.  Continue to use ice on the hip for pain and swelling from surgery. You may notice swelling that will progress down to the foot and ankle.  This is normal after surgery.  Elevate the leg when you are not up walking on it.    Continue to use the breathing machine which will help keep your temperature down.  It is common for your temperature to cycle up and down following surgery, especially at night when you are not up moving around and exerting yourself.  The breathing machine keeps your lungs expanded and your temperature down.  Do not place pillow under knee, focus on keeping the knee straight while resting  DIET You may resume your previous home diet once your are discharged from the hospital.  DRESSING / WOUND CARE / SHOWERING Keep the surgical dressing until follow up.  The dressing is water proof, so you can shower without any extra covering.  IF THE DRESSING FALLS OFF or the wound gets wet inside, change the dressing with sterile gauze.  Please use good hand washing techniques before changing the dressing.  Do not use any lotions or creams on the incision until instructed by your surgeon.     Wound VAC on left hip can be discontinued at 1 week.  Apply bandage to cover wound. Keep your dressing dry with showering.  You can keep it covered and pat dry. Change the surgical dressing daily and reapply a dry dressing each time.  ACTIVITY Walk with your walker as instructed. Use walker as long as suggested by your caregivers. Avoid periods of inactivity such as sitting longer than an hour when not asleep. This helps prevent blood clots.  You may resume a sexual relationship in one month or when given the OK by your doctor.  You may return to work once you are cleared by your doctor.  Do not drive a car for 6 weeks or until released by you surgeon.  Do not drive while taking narcotics.  WEIGHT BEARING Weight bearing as tolerated with assist device (walker, cane, etc) as directed, use it as long as suggested by your surgeon or therapist, typically at least 4-6 weeks.  POSTOPERATIVE CONSTIPATION PROTOCOL Constipation - defined medically as fewer than three stools per week and severe constipation as less than one stool per week.  One of the most common issues patients have following surgery is constipation.  Even if you have a regular bowel pattern at home, your normal regimen is likely to be disrupted due to multiple reasons following surgery.  Combination of anesthesia, postoperative narcotics, change in appetite and fluid intake all can affect your bowels.  In order to avoid complications following surgery, here are some recommendations in order to help you during your recovery period.  Colace (docusate) - Pick up an over-the-counter form of Colace or another stool softener and take twice a day as long as you are requiring postoperative pain medications.  Take with a full glass of water daily.  If you experience loose stools or diarrhea, hold the colace until you stool forms back up.  If your symptoms do not get better within 1 week or if they get worse, check with your doctor.  Dulcolax (bisacodyl) - Pick up over-the-counter and take as  directed by the product packaging as needed to assist with the movement of your bowels.  Take with a full glass of water.  Use this product as needed if not relieved by Colace only.   MiraLax (polyethylene glycol) - Pick up over-the-counter to have on hand.  MiraLax is a solution that will increase the amount of water in your bowels to assist with bowel movements.  Take as directed and can mix with a glass of water, juice, soda, coffee, or tea.  Take if you go more than two days without a movement. Do not use MiraLax more than once per day. Call your doctor if you are still constipated or irregular after using this medication for 7 days in a row.  If you continue to have problems with postoperative constipation, please contact the office for further assistance and recommendations.  If you experience "the worst abdominal pain ever" or develop nausea or vomiting, please contact the office immediatly for further recommendations for treatment.  ITCHING  If you experience itching with your medications, try taking only a single pain pill, or even half a pain pill at a time.  You can also use Benadryl over the counter for itching or also to help with sleep.   TED HOSE STOCKINGS Wear the elastic stockings on both legs for three weeks following surgery during the day but you may remove then at night for sleeping.  MEDICATIONS See your medication summary on the After Visit Summary that the nursing staff will review with you prior to discharge.  You may have some home medications which will be placed on hold until you complete the course of blood thinner medication.  It is important for you to complete the blood thinner medication as prescribed by your surgeon.  Continue your approved medications as instructed at time of discharge.  PRECAUTIONS If you experience chest pain or shortness of breath - call 911 immediately for transfer to the hospital emergency department.  If you develop a fever greater that  101 F, purulent drainage from wound, increased redness or drainage from wound, foul odor from the wound/dressing, or calf pain - CONTACT YOUR SURGEON.                                                   FOLLOW-UP APPOINTMENTS Make sure you keep all of your appointments after your operation with your surgeon and caregivers. You should call the office at the above phone number and make an appointment for approximately two weeks after the date of your surgery or on the date instructed by your surgeon outlined in the "After Visit Summary".  RANGE OF MOTION AND STRENGTHENING EXERCISES  These exercises are designed to help you keep full movement of your hip joint. Follow your caregiver's or physical therapist's instructions. Perform all exercises about fifteen times, three times per day or as  directed. Exercise both hips, even if you have had only one joint replacement. These exercises can be done on a training (exercise) mat, on the floor, on a table or on a bed. Use whatever works the best and is most comfortable for you. Use music or television while you are exercising so that the exercises are a pleasant break in your day. This will make your life better with the exercises acting as a break in routine you can look forward to.  Lying on your back, slowly slide your foot toward your buttocks, raising your knee up off the floor. Then slowly slide your foot back down until your leg is straight again.  Lying on your back spread your legs as far apart as you can without causing discomfort.  Lying on your side, raise your upper leg and foot straight up from the floor as far as is comfortable. Slowly lower the leg and repeat.  Lying on your back, tighten up the muscle in the front of your thigh (quadriceps muscles). You can do this by keeping your leg straight and trying to raise your heel off the floor. This helps strengthen the largest muscle supporting your knee.  Lying on your back, tighten up the muscles of your  buttocks both with the legs straight and with the knee bent at a comfortable angle while keeping your heel on the floor.   IF YOU ARE TRANSFERRED TO A SKILLED REHAB FACILITY If the patient is transferred to a skilled rehab facility following release from the hospital, a list of the current medications will be sent to the facility for the patient to continue.  When discharged from the skilled rehab facility, please have the facility set up the patient's [place] prior to being released. Also, the skilled facility will be responsible for providing the patient with their medications at time of release from the facility to include their pain medication, the muscle relaxants, and their blood thinner medication. If the patient is still at the rehab facility at time of the two week follow up appointment, the skilled rehab facility will also need to assist the patient in arranging follow up appointment in our office and any transportation needs.  MAKE SURE YOU:  Understand these instructions.  Get help right away if you are not doing well or get worse.    Pick up stool softner and laxative for home use following surgery while on pain medications. Do not submerge incision under water. Please use good hand washing techniques while changing dressing each day. May shower starting three days after surgery. Please use a clean towel to pat the incision dry following showers. Continue to use ice for pain and swelling after surgery. Do not use any lotions or creams on the incision until instructed by your surgeon.

## 2016-07-27 NOTE — Progress Notes (Signed)
Physical Therapy Treatment Patient Details Name: Sara David MRN: [id number] DOB: Mar 11, 1946 Today's Date: 07/27/2016    History of Present Illness Pt. is a 70 y.o. female who was admitted to [facility] with a Left Anterior Hip Replacement.    PT Comments    Pt was able to ambulate to/from rehab gym and participate in stair training.  She reports 2 steps into home without rails.  Encouraged her to have rails installed for safety. Stated she uses a cane at home and holds onto doorframe as needed.  She was able to go up/dow 2 steps with bilateral rails and 2 steps with right rail and left cane.  No lob's but required verbal cues for sequencing.  Pt stated she felt confident with them and would work on having rails installed.   Follow Up Recommendations  Home health PT     Equipment Recommendations  None recommended by PT    Recommendations for Other Services       Precautions / Restrictions Precautions Precautions: Fall;Anterior Hip Restrictions Weight Bearing Restrictions: Yes LLE Weight Bearing: Weight bearing as tolerated    Mobility  Bed Mobility Overal bed mobility: Needs Assistance Bed Mobility: Sit to Supine       Sit to supine: Min assist   General bed mobility comments: Pt showed good effort in trying to get LEs back up into bed, but needed assist to get L LE up  Transfers Overall transfer level: Independent Equipment used: Rolling walker (2 wheeled)                Ambulation/Gait Ambulation/Gait assistance: Min guard Ambulation Distance (Feet): 150 Feet Assistive device: Rolling walker (2 wheeled) Gait Pattern/deviations: Step-through pattern;Decreased step length - right;Decreased stance time - left   Gait velocity interpretation: <1.8 ft/sec, indicative of risk for recurrent falls General Gait Details: no lob and good confidence.   Stairs Stairs: Yes   Stair Management: One rail Right;With cane Number of Stairs: 4 General stair comments:  education for sequencing  Wheelchair Mobility    Modified Rankin (Stroke Patients Only)       Balance Overall balance assessment: Modified Independent                                  Cognition Arousal/Alertness: Awake/alert Behavior During Therapy: WFL for tasks assessed/performed Overall Cognitive Status: Within Functional Limits for tasks assessed                      Exercises Other Exercises Other Exercises: to bathroom with supervision Other Exercises: verbal review of HEP    General Comments        Pertinent Vitals/Pain Pain Assessment: 0-10 Pain Score: 4  Pain Location: Left Hip Pain Descriptors / Indicators: Operative site guarding;Sore Pain Intervention(s): Limited activity within patient's tolerance    Home Living                      Prior Function            PT Goals (current goals can now be found in the care plan section)      Frequency    BID      PT Plan Current plan remains appropriate    Co-evaluation             End of Session Equipment Utilized During Treatment: Gait belt Activity Tolerance: Patient tolerated treatment well Patient left: with  call bell/phone within reach;with bed alarm set     Time: [id number] PT Time Calculation (min) (ACUTE ONLY): 26 min  Charges:  $Gait Training: 8-22 mins $Therapeutic Activity: 8-22 mins                    G Codes:      Chesley Noon August 04, 2016, 10:43 AM

## 2016-07-27 NOTE — Progress Notes (Signed)
Patient is discharged home with family. IV removed with cath intact. Prevena wound vac in place. Scripts, last dose given, reviewed with patient. Husband at bedside during instructions.

## 2016-07-29 LAB — SURGICAL PATHOLOGY

## 2016-08-26 ENCOUNTER — Other Ambulatory Visit: Payer: Self-pay | Admitting: *Deleted

## 2016-08-26 DIAGNOSIS — C801 Malignant (primary) neoplasm, unspecified: Secondary | ICD-10-CM

## 2016-08-26 DIAGNOSIS — C50911 Malignant neoplasm of unspecified site of right female breast: Secondary | ICD-10-CM

## 2016-08-26 MED ORDER — ZOLPIDEM TARTRATE 10 MG PO TABS: 10.0000 mg | ORAL_TABLET | Freq: Every evening | ORAL | 0 refills | Status: DC | PRN

## 2016-09-04 ENCOUNTER — Ambulatory Visit: Payer: Medicare Other | Admitting: Oncology

## 2016-09-04 ENCOUNTER — Other Ambulatory Visit: Payer: Medicare Other

## 2016-09-25 ENCOUNTER — Other Ambulatory Visit: Payer: Self-pay | Admitting: *Deleted

## 2016-09-25 DIAGNOSIS — C50911 Malignant neoplasm of unspecified site of right female breast: Secondary | ICD-10-CM

## 2016-09-25 DIAGNOSIS — C801 Malignant (primary) neoplasm, unspecified: Secondary | ICD-10-CM

## 2016-09-25 MED ORDER — ZOLPIDEM TARTRATE 10 MG PO TABS: 10.0000 mg | ORAL_TABLET | Freq: Every evening | ORAL | 0 refills | Status: DC | PRN

## 2016-10-23 NOTE — Progress Notes (Signed)
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  INTERVAL HISTORY: Patient returns to clinic today for routine follow-up. She has now had bilateral hip replacement with significant improvement of her pain and performance status.  She continues to tolerate letrozole well without significant side effects.  She has no neurologic complaints.  She denies any chest pain or shortness of breath. She has a fair appetite and denies weight loss. She denies vomiting or constipation.  She has no melena or hematochezia.  She has no urinary complaints.  Patient offers no further specific complaints today.  REVIEW OF SYSTEMS:   Review of Systems  Constitutional: Negative for fever, malaise/fatigue and weight loss.  Respiratory: Negative.  Negative for cough and shortness of breath.   Cardiovascular: Negative.  Negative for chest pain and leg swelling.  Gastrointestinal: Negative for abdominal pain, blood in stool and melena.  Genitourinary: Negative.   Musculoskeletal: Negative for back pain and joint pain.  Neurological: Negative for sensory change and weakness.  Psychiatric/Behavioral: Negative.  The patient is not nervous/anxious.     As per HPI. Otherwise, a complete review of systems is negative.  PAST MEDICAL HISTORY: Past Medical History:  Diagnosis Date  . Anxiety   . Arthritis    "back" (04/03/2016)  . Breast cancer ([place]) 2012    no lumpectomy. Treating with meds- Right  . Cancer ([place])    small intestines 2012, gallbladder 2016  . Chronic lower back pain   . COPD (chronic obstructive pulmonary disease) ([employer])    "use inhalers for COPD that dx'd at one time" (04/03/2016)  . Family history of adverse reaction to anesthesia    daughter gets nauseated  . GERD (gastroesophageal reflux disease)   . High cholesterol   . Hypertension   . Hypothyroidism   . Skin cancer    "right neck; mid forehead"    PAST SURGICAL HISTORY: Past Surgical History:  Procedure Laterality Date  . ABDOMINAL HERNIA REPAIR     "after they took tumor out of my colon I developed a hernia"  . ABDOMINAL HYSTERECTOMY    . BACK SURGERY    . BREAST BIOPSY Right 2012   positive  . BREAST BIOPSY Right 2014   positive  . BREAST BIOPSY Right 06/26/2016   US guided - waiting for pathology  . BREAST EXCISIONAL BIOPSY Right 1995   benign  . CARPAL TUNNEL RELEASE Left   . DILATION AND CURETTAGE OF UTERUS     S/P miscarriage  . IR GENERIC HISTORICAL  04/04/2016   IR LUMBAR DISC ASPIRATION W/IMG GUIDE 04/04/2016 [name], MD [facility]-INTERV [facility]  . JOINT REPLACEMENT    . LAPAROSCOPIC CHOLECYSTECTOMY  2016  . SKIN CANCER EXCISION     "right neck; mid forehead"  . TOTAL HIP ARTHROPLASTY Right 10/03/2015   Procedure: TOTAL HIP ARTHROPLASTY ANTERIOR APPROACH;  Surgeon: Hessie Knows, MD;  Location: [facility] ORS;  Service: Orthopedics;  Laterality: Right;  . TOTAL HIP ARTHROPLASTY Left 07/25/2016   Procedure: TOTAL HIP ARTHROPLASTY ANTERIOR APPROACH;  Surgeon: Hessie Knows, MD;  Location: [facility] ORS;  Service: Orthopedics;  Laterality: Left;  . TUBAL LIGATION    . TUMOR EXCISION  2012   "  small intestine; came from the breast cancer"    FAMILY HISTORY: Reviewed and unchanged. No reported history of breast cancer or other chronic diseases.     ADVANCED DIRECTIVES:    HEALTH MAINTENANCE: Social History  Substance Use Topics  . Smoking status: Current Every Day Smoker    Packs/day: 1.00    Years: 54.00    Types: Cigarettes  . Smokeless tobacco: Never Used     Comment: not ready to quit  . Alcohol use No      Colonoscopy:  PAP:  Bone density:  Lipid panel:  Allergies  Allergen Reactions  . Carafate [Sucralfate] Nausea Only  . Diclofenac Sodium Nausea Only  . Erythromycin Diarrhea and Nausea And Vomiting  . Adhesive [Tape] Itching and Rash  . Wellbutrin [Bupropion] Itching and Rash    Current Outpatient Prescriptions  Medication Sig Dispense Refill  . acetaminophen (TYLENOL) 500 MG tablet Take 1,000 mg by mouth daily as needed for mild pain or fever.     . cetirizine (ZYRTEC) 10 MG tablet Take 10 mg by mouth daily as needed for allergies. Reported on 10/03/2015    . citalopram (CELEXA) 20 MG tablet Take 20 mg by mouth every morning.     . dicyclomine (BENTYL) 10 MG capsule Take 10 mg by mouth 3 (three) times daily as needed for spasms.    . Fluticasone-Salmeterol (ADVAIR) 100-50 MCG/DOSE AEPB Inhale 1 puff into the lungs every morning.     Marland Kitchen letrozole (FEMARA) 2.5 MG tablet TAKE 1 TABLET ONE TIME DAILY 90 tablet 4  . levothyroxine (SYNTHROID, LEVOTHROID) 100 MCG tablet Take 100 mcg by mouth daily before breakfast.     . montelukast (SINGULAIR) 10 MG tablet Take 10 mg by mouth at bedtime.     . Multiple Vitamins-Minerals (CENTRUM SILVER PO) Take 1 tablet by mouth every morning.    Marland Kitchen omeprazole (PRILOSEC) 20 MG capsule Take 20 mg by mouth every morning.     . polyethylene glycol (MIRALAX / GLYCOLAX) packet Take 17 g by mouth daily. (Patient taking differently: Take 17 g by mouth daily as needed for mild constipation. ) 14 each 0  . pravastatin (PRAVACHOL) 40 MG tablet Take 40 mg by mouth at bedtime.     Marland Kitchen zolpidem (AMBIEN) 10 MG tablet Take 1 tablet (10 mg total) by mouth at bedtime as needed. for sleep 30 tablet 0   No current facility-administered medications for this visit.     OBJECTIVE: Vitals:   10/24/16 1124  BP: (!) 174/94  Pulse: 72  Resp: 18  Temp: 98.5 F (36.9 C)     Body mass index is 23.99 kg/m.    ECOG FS:1 - Symptomatic but completely ambulatory  General:  Well-developed, well-nourished, no acute distress. Eyes: Pink conjunctiva, anicteric sclera. Breasts: Patient requested exam be deferred today. Lungs: Clear to auscultation bilaterally. Heart: Regular rate and rhythm. No rubs, murmurs, or gallops. Abdomen: Soft, nontender, nondistended. No organomegaly noted, normoactive bowel sounds. Musculoskeletal: No edema, cyanosis, or clubbing.  Well-healing surgical scar on lumbar spine without erythema. Neuro: Alert, answering all questions appropriately. Cranial nerves grossly intact. Skin: No rashes or petechiae noted. Psych: Normal affect.   LAB RESULTS:  Lab Results  Component Value Date   NA 137 07/27/2016   K 3.8 07/27/2016   CL 104 07/27/2016   CO2 28 07/27/2016   GLUCOSE 118 (H) 07/27/2016   BUN 15 07/27/2016   CREATININE 0.81 07/27/2016   CALCIUM 8.6 (L) 07/27/2016   PROT 7.2  05/30/2016   ALBUMIN 4.2 05/30/2016   AST 24 05/30/2016   ALT 17 05/30/2016   ALKPHOS 89 05/30/2016   BILITOT 0.5 05/30/2016   GFRNONAA >60 07/27/2016   GFRAA >60 07/27/2016    Lab Results  Component Value Date   WBC 6.2 07/27/2016   NEUTROABS 4.4 02/26/2016   HGB 10.2 (L) 07/27/2016   HCT 29.1 (L) 07/27/2016   MCV 89.6 07/27/2016   PLT 180 07/27/2016   Lab Results  Component Value Date   LABCA2 61.1 (H) 10/24/2016     STUDIES: No results found.  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  PLAN:    1.  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 Recent biopsy of right breast consistent with patient's known primary. We discussed the possibility of adding Ibrance to her treatment regimen once again, but ultimately agreed to not initiate any treatment at this time. Patient expressed understanding that she likely will need to initiate  treatment in the near future given her slowly increasing CA-27-29. Continue letrozole, which she will require indefinitely or until significant progression of disease. Return to clinic in 3 months with repeat laboratory work, further evaluation, and possible initiation of Ibrance. 2.  Hip/back pain: Significantly improved. Patient is status post bilateral hip replacement. 3.  Diarrhea: Resolved. Continue Imodium as needed.  4.  Nausea: Phenergan, take as needed.  Approximately 30 minutes was spent in discussion of which grade and 50% was consultation.  Patient expressed understanding and was in agreement with this plan. She also understands that She can call clinic at any time with any questions, concerns, or complaints.    Lloyd Huger, MD   10/27/2016 7:17 PM

## 2016-10-24 ENCOUNTER — Inpatient Hospital Stay: Payer: Medicare Other

## 2016-10-24 ENCOUNTER — Inpatient Hospital Stay: Payer: Medicare Other | Attending: Oncology | Admitting: Oncology

## 2016-10-24 VITALS — BP 174/94 | HR 72 | Temp 98.5°F | Resp 18 | Wt 139.8 lb

## 2016-10-24 DIAGNOSIS — E7800 Pure hypercholesterolemia, unspecified: Secondary | ICD-10-CM

## 2016-10-24 DIAGNOSIS — C784 Secondary malignant neoplasm of small intestine: Secondary | ICD-10-CM | POA: Diagnosis not present

## 2016-10-24 DIAGNOSIS — Z170 Estrogen receptor positive status [ER+]: Secondary | ICD-10-CM | POA: Diagnosis not present

## 2016-10-24 DIAGNOSIS — C7951 Secondary malignant neoplasm of bone: Secondary | ICD-10-CM | POA: Diagnosis not present

## 2016-10-24 DIAGNOSIS — J449 Chronic obstructive pulmonary disease, unspecified: Secondary | ICD-10-CM | POA: Insufficient documentation

## 2016-10-24 DIAGNOSIS — M129 Arthropathy, unspecified: Secondary | ICD-10-CM | POA: Diagnosis not present

## 2016-10-24 DIAGNOSIS — C50411 Malignant neoplasm of upper-outer quadrant of right female breast: Secondary | ICD-10-CM | POA: Insufficient documentation

## 2016-10-24 DIAGNOSIS — F419 Anxiety disorder, unspecified: Secondary | ICD-10-CM | POA: Diagnosis not present

## 2016-10-24 DIAGNOSIS — G8929 Other chronic pain: Secondary | ICD-10-CM | POA: Diagnosis not present

## 2016-10-24 DIAGNOSIS — Z85828 Personal history of other malignant neoplasm of skin: Secondary | ICD-10-CM

## 2016-10-24 DIAGNOSIS — C7889 Secondary malignant neoplasm of other digestive organs: Secondary | ICD-10-CM | POA: Diagnosis not present

## 2016-10-24 DIAGNOSIS — R110 Nausea: Secondary | ICD-10-CM | POA: Insufficient documentation

## 2016-10-24 DIAGNOSIS — F17210 Nicotine dependence, cigarettes, uncomplicated: Secondary | ICD-10-CM | POA: Diagnosis not present

## 2016-10-24 DIAGNOSIS — E039 Hypothyroidism, unspecified: Secondary | ICD-10-CM | POA: Insufficient documentation

## 2016-10-24 DIAGNOSIS — Z79811 Long term (current) use of aromatase inhibitors: Secondary | ICD-10-CM | POA: Diagnosis not present

## 2016-10-24 DIAGNOSIS — Z96643 Presence of artificial hip joint, bilateral: Secondary | ICD-10-CM | POA: Insufficient documentation

## 2016-10-24 DIAGNOSIS — C50911 Malignant neoplasm of unspecified site of right female breast: Secondary | ICD-10-CM

## 2016-10-24 DIAGNOSIS — C801 Malignant (primary) neoplasm, unspecified: Secondary | ICD-10-CM

## 2016-10-24 DIAGNOSIS — I10 Essential (primary) hypertension: Secondary | ICD-10-CM | POA: Insufficient documentation

## 2016-10-24 DIAGNOSIS — M545 Low back pain: Secondary | ICD-10-CM | POA: Diagnosis not present

## 2016-10-24 DIAGNOSIS — K219 Gastro-esophageal reflux disease without esophagitis: Secondary | ICD-10-CM | POA: Diagnosis not present

## 2016-10-24 MED ORDER — ZOLPIDEM TARTRATE 10 MG PO TABS: 10.0000 mg | ORAL_TABLET | Freq: Every evening | ORAL | 0 refills | Status: DC | PRN

## 2016-10-24 NOTE — Progress Notes (Signed)
Complains of moderate back pain that states will ask PCP for further management. Last breast exam was in Dec 2017 per pt.

## 2016-10-25 LAB — CANCER ANTIGEN 27.29: CA 27.29: 61.1 U/mL — ABNORMAL HIGH (ref 0.0–38.6)

## 2016-11-25 ENCOUNTER — Other Ambulatory Visit: Payer: Self-pay | Admitting: *Deleted

## 2016-11-25 DIAGNOSIS — C801 Malignant (primary) neoplasm, unspecified: Secondary | ICD-10-CM

## 2016-11-25 DIAGNOSIS — C50911 Malignant neoplasm of unspecified site of right female breast: Secondary | ICD-10-CM

## 2016-11-25 MED ORDER — ZOLPIDEM TARTRATE 10 MG PO TABS: 10.0000 mg | ORAL_TABLET | Freq: Every evening | ORAL | 0 refills | Status: DC | PRN

## 2016-12-05 ENCOUNTER — Inpatient Hospital Stay: Payer: Medicare Other | Attending: Oncology

## 2016-12-05 DIAGNOSIS — Z170 Estrogen receptor positive status [ER+]: Secondary | ICD-10-CM | POA: Insufficient documentation

## 2016-12-05 DIAGNOSIS — C50411 Malignant neoplasm of upper-outer quadrant of right female breast: Secondary | ICD-10-CM | POA: Insufficient documentation

## 2016-12-06 LAB — CANCER ANTIGEN 27.29: CA 27.29: 61.3 U/mL — AB (ref 0.0–38.6)

## 2016-12-23 ENCOUNTER — Other Ambulatory Visit: Payer: Self-pay | Admitting: *Deleted

## 2016-12-23 DIAGNOSIS — C801 Malignant (primary) neoplasm, unspecified: Secondary | ICD-10-CM

## 2016-12-23 DIAGNOSIS — C50911 Malignant neoplasm of unspecified site of right female breast: Secondary | ICD-10-CM

## 2016-12-23 MED ORDER — ZOLPIDEM TARTRATE 10 MG PO TABS: 10.0000 mg | ORAL_TABLET | Freq: Every evening | ORAL | 0 refills | Status: DC | PRN

## 2017-01-15 NOTE — Progress Notes (Signed)
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  INTERVAL HISTORY: Patient returns to clinic today as an add-on with concerns of a new lump in her right breast. She otherwise feels well and is asymptomatic. She continues to tolerate letrozole well without significant side effects.  She has no neurologic complaints.  She denies any chest pain or shortness of breath. She has a fair appetite and denies weight loss. She denies vomiting or constipation.  She has no melena or hematochezia.  She has no urinary complaints.  Patient offers no further specific complaints today.  REVIEW OF SYSTEMS:   Review of Systems  Constitutional: Negative for fever, malaise/fatigue and weight loss.  Respiratory: Negative.  Negative for cough and shortness of breath.   Cardiovascular: Negative.  Negative for chest pain and leg swelling.  Gastrointestinal: Negative for abdominal pain, blood in stool and melena.  Genitourinary: Negative.   Musculoskeletal: Negative for back pain and joint pain.  Skin: Negative.  Negative for rash.  Neurological: Negative for sensory change and weakness.  Psychiatric/Behavioral: The patient is nervous/anxious.     As per HPI. Otherwise, a complete review of systems is negative.  PAST MEDICAL HISTORY: Past Medical History:  Diagnosis Date  . Anxiety   . Arthritis    "back" (04/03/2016)  . Breast cancer ([place]) 2012    no lumpectomy. Treating with meds- Right  . Cancer ([place])    small intestines 2012, gallbladder 2016  . Chronic lower back pain   . COPD (chronic obstructive pulmonary disease) ([place])    "use inhalers for COPD that dx'd at one time" (04/03/2016)  . Family history of adverse reaction to anesthesia    daughter gets nauseated  . GERD (gastroesophageal reflux disease)   . High cholesterol   . Hypertension   . Hypothyroidism   . Skin cancer    "right neck; mid forehead"    PAST SURGICAL HISTORY: Past Surgical History:  Procedure Laterality Date  . ABDOMINAL HERNIA REPAIR     "after they took tumor out of my colon I developed a hernia"  . ABDOMINAL HYSTERECTOMY    . BACK SURGERY    . BREAST BIOPSY Right 2012   positive  . BREAST BIOPSY Right 2014   positive  . BREAST BIOPSY Right 06/26/2016   US guided - waiting for pathology  . BREAST EXCISIONAL BIOPSY Right 1995   benign  . CARPAL TUNNEL RELEASE Left   . DILATION AND CURETTAGE OF UTERUS     S/P miscarriage  . IR GENERIC HISTORICAL  04/04/2016   IR LUMBAR DISC ASPIRATION W/IMG GUIDE 04/04/2016 [name], MD [facility]  . JOINT REPLACEMENT    . LAPAROSCOPIC CHOLECYSTECTOMY  2016  . SKIN CANCER EXCISION     "right neck; mid forehead"  . TOTAL HIP ARTHROPLASTY Right 10/03/2015   Procedure: TOTAL HIP ARTHROPLASTY ANTERIOR APPROACH;  Surgeon: Hessie Knows, MD;  Location: [facility] ORS;  Service: Orthopedics;  Laterality: Right;  . TOTAL HIP ARTHROPLASTY Left 07/25/2016   Procedure: TOTAL HIP ARTHROPLASTY ANTERIOR APPROACH;  Surgeon: Hessie Knows, MD;  Location: [facility] ORS;  Service: Orthopedics;  Laterality: Left;  . TUBAL LIGATION    . TUMOR  EXCISION  2012   "small intestine; came from the breast cancer"    FAMILY HISTORY: Reviewed and unchanged. No reported history of breast cancer or other chronic diseases.     ADVANCED DIRECTIVES:    HEALTH MAINTENANCE: Social History  Substance Use Topics  . Smoking status: Current Every Day Smoker    Packs/day: 1.00    Years: 54.00    Types: Cigarettes  . Smokeless tobacco: Never Used     Comment: not ready to quit  . Alcohol use No      Colonoscopy:  PAP:  Bone density:  Lipid panel:  Allergies  Allergen Reactions  . Carafate [Sucralfate] Nausea Only  . Diclofenac Sodium Nausea Only  . Erythromycin Diarrhea and Nausea And Vomiting  . Adhesive [Tape] Itching and Rash  . Wellbutrin [Bupropion] Itching and Rash    Current Outpatient Prescriptions  Medication Sig Dispense Refill  . acetaminophen (TYLENOL) 500 MG tablet Take 1,000 mg by mouth daily as needed for mild pain or fever.     . cetirizine (ZYRTEC) 10 MG tablet Take 10 mg by mouth daily as needed for allergies. Reported on 10/03/2015    . citalopram (CELEXA) 20 MG tablet Take 20 mg by mouth every morning.     . dicyclomine (BENTYL) 10 MG capsule Take 10 mg by mouth 3 (three) times daily as needed for spasms.    . Fluticasone-Salmeterol (ADVAIR) 100-50 MCG/DOSE AEPB Inhale 1 puff into the lungs every morning.     Marland Kitchen letrozole (FEMARA) 2.5 MG tablet TAKE 1 TABLET ONE TIME DAILY 90 tablet 4  . levothyroxine (SYNTHROID, LEVOTHROID) 100 MCG tablet Take 100 mcg by mouth daily before breakfast.     . montelukast (SINGULAIR) 10 MG tablet Take 10 mg by mouth at bedtime.     . Multiple Vitamins-Minerals (CENTRUM SILVER PO) Take 1 tablet by mouth every morning.    Marland Kitchen omeprazole (PRILOSEC) 20 MG capsule Take 20 mg by mouth every morning.     . polyethylene glycol (MIRALAX / GLYCOLAX) packet Take 17 g by mouth daily. (Patient taking differently: Take 17 g by mouth daily as needed for mild constipation. ) 14 each 0  . pravastatin (PRAVACHOL) 40 MG tablet Take 40 mg by mouth at bedtime.     Marland Kitchen zolpidem (AMBIEN) 10 MG tablet Take 1 tablet (10 mg total) by mouth at bedtime as needed. for sleep 30 tablet 0  . palbociclib (IBRANCE) 125 MG capsule Take one tablet daily for 21 days with 7 days off. Take whole with food. 21 capsule 2   No current facility-administered medications for this visit.     OBJECTIVE: Vitals:   01/16/17 1147  BP: (!) 141/88  Pulse: 67  Resp: 18   Temp: 98 F (36.7 C)     Body mass index is 24.29 kg/m.    ECOG FS:1 - Symptomatic but completely ambulatory  General: Well-developed, well-nourished, no acute distress. Eyes: Pink conjunctiva, anicteric sclera. Breasts: Right breast with new lump at approximately 1:00. Left breast without lumps or masses. Lungs: Clear to auscultation bilaterally. Heart: Regular rate and rhythm. No rubs, murmurs, or gallops. Abdomen: Soft, nontender, nondistended. No organomegaly noted, normoactive bowel sounds. Musculoskeletal: No edema, cyanosis, or clubbing.   Neuro: Alert, answering all questions appropriately. Cranial nerves grossly intact. Skin: No rashes or petechiae noted. Psych: Normal affect.   LAB RESULTS:  Lab Results  Component Value Date   NA 137 07/27/2016   K 3.8 07/27/2016   CL 104 07/27/2016  CO2 28 07/27/2016   GLUCOSE 118 (H) 07/27/2016   BUN 15 07/27/2016   CREATININE 0.81 07/27/2016   CALCIUM 8.6 (L) 07/27/2016   PROT 7.2 05/30/2016   ALBUMIN 4.2 05/30/2016   AST 24 05/30/2016   ALT 17 05/30/2016   ALKPHOS 89 05/30/2016   BILITOT 0.5 05/30/2016   GFRNONAA >60 07/27/2016   GFRAA >60 07/27/2016    Lab Results  Component Value Date   WBC 6.2 07/27/2016   NEUTROABS 4.4 02/26/2016   HGB 10.2 (L) 07/27/2016   HCT 29.1 (L) 07/27/2016   MCV 89.6 07/27/2016   PLT 180 07/27/2016   Lab Results  Component Value Date   LABCA2 61.3 (H) 12/05/2016     STUDIES: No results found.  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  PLAN:    1.  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 Recent biopsy of right breast consistent with patient's known primary. Newly palpated lump on right breast is also likely progression of disease. After lengthy  discussion with the patient, she has agreed to add Ibrance 120 mg for 21 days with 7 days off to her treatment regimen. She has been instructed to continue letrozole. Return to clinic in 6 weeks with repeat laboratory work and further evaluation.  2.  Hip/back pain: Significantly improved. Patient is status post bilateral hip replacement. 3.  Anemia: Mild, monitor.  Approximately 30 minutes was spent in discussion of which grade and 50% was consultation.  Patient expressed understanding and was in agreement with this plan. She also understands that She can call clinic at any time with any questions, concerns, or complaints.    Lloyd Huger, MD   01/19/2017 11:08 PM

## 2017-01-16 ENCOUNTER — Inpatient Hospital Stay: Payer: Medicare Other | Attending: Oncology | Admitting: Oncology

## 2017-01-16 VITALS — BP 141/88 | HR 67 | Temp 98.0°F | Resp 18 | Wt 141.5 lb

## 2017-01-16 DIAGNOSIS — F419 Anxiety disorder, unspecified: Secondary | ICD-10-CM | POA: Insufficient documentation

## 2017-01-16 DIAGNOSIS — M129 Arthropathy, unspecified: Secondary | ICD-10-CM | POA: Diagnosis not present

## 2017-01-16 DIAGNOSIS — C7889 Secondary malignant neoplasm of other digestive organs: Secondary | ICD-10-CM

## 2017-01-16 DIAGNOSIS — C7951 Secondary malignant neoplasm of bone: Secondary | ICD-10-CM | POA: Insufficient documentation

## 2017-01-16 DIAGNOSIS — F17210 Nicotine dependence, cigarettes, uncomplicated: Secondary | ICD-10-CM | POA: Insufficient documentation

## 2017-01-16 DIAGNOSIS — J449 Chronic obstructive pulmonary disease, unspecified: Secondary | ICD-10-CM | POA: Diagnosis not present

## 2017-01-16 DIAGNOSIS — Z79811 Long term (current) use of aromatase inhibitors: Secondary | ICD-10-CM | POA: Insufficient documentation

## 2017-01-16 DIAGNOSIS — M549 Dorsalgia, unspecified: Secondary | ICD-10-CM | POA: Diagnosis not present

## 2017-01-16 DIAGNOSIS — Z85828 Personal history of other malignant neoplasm of skin: Secondary | ICD-10-CM | POA: Insufficient documentation

## 2017-01-16 DIAGNOSIS — C50411 Malignant neoplasm of upper-outer quadrant of right female breast: Secondary | ICD-10-CM | POA: Insufficient documentation

## 2017-01-16 DIAGNOSIS — K219 Gastro-esophageal reflux disease without esophagitis: Secondary | ICD-10-CM | POA: Diagnosis not present

## 2017-01-16 DIAGNOSIS — E7800 Pure hypercholesterolemia, unspecified: Secondary | ICD-10-CM | POA: Diagnosis not present

## 2017-01-16 DIAGNOSIS — I10 Essential (primary) hypertension: Secondary | ICD-10-CM | POA: Insufficient documentation

## 2017-01-16 DIAGNOSIS — Z79899 Other long term (current) drug therapy: Secondary | ICD-10-CM | POA: Insufficient documentation

## 2017-01-16 DIAGNOSIS — D649 Anemia, unspecified: Secondary | ICD-10-CM | POA: Insufficient documentation

## 2017-01-16 DIAGNOSIS — C784 Secondary malignant neoplasm of small intestine: Secondary | ICD-10-CM | POA: Insufficient documentation

## 2017-01-16 DIAGNOSIS — Z170 Estrogen receptor positive status [ER+]: Secondary | ICD-10-CM | POA: Insufficient documentation

## 2017-01-16 DIAGNOSIS — C50911 Malignant neoplasm of unspecified site of right female breast: Secondary | ICD-10-CM

## 2017-01-16 MED ORDER — PALBOCICLIB 125 MG PO CAPS: ORAL_CAPSULE | ORAL | 2 refills | Status: DC

## 2017-01-16 NOTE — Progress Notes (Signed)
Recently noticed new lump in right breast. Lump is not painful or tender.

## 2017-01-21 ENCOUNTER — Other Ambulatory Visit: Payer: Self-pay | Admitting: *Deleted

## 2017-01-21 DIAGNOSIS — C50911 Malignant neoplasm of unspecified site of right female breast: Secondary | ICD-10-CM

## 2017-01-21 DIAGNOSIS — C801 Malignant (primary) neoplasm, unspecified: Secondary | ICD-10-CM

## 2017-01-21 MED ORDER — ZOLPIDEM TARTRATE 10 MG PO TABS: 10.0000 mg | ORAL_TABLET | Freq: Every evening | ORAL | 2 refills | Status: DC | PRN

## 2017-01-27 ENCOUNTER — Ambulatory Visit: Payer: Medicare Other | Admitting: Oncology

## 2017-01-27 ENCOUNTER — Other Ambulatory Visit: Payer: Medicare Other

## 2017-02-03 ENCOUNTER — Telehealth: Payer: Self-pay | Admitting: *Deleted

## 2017-02-03 NOTE — Telephone Encounter (Signed)
Called and spoke with patient. Gave her the correct fax number. She will call them and give them the correct fax number

## 2017-02-03 NOTE — Telephone Encounter (Signed)
Patient called asking if Sara David faxed the form for patient assistance to you and would like a return call to let her know if we received form or not. Please return her call [phone]

## 2017-02-10 ENCOUNTER — Other Ambulatory Visit: Payer: Medicare Other

## 2017-02-10 ENCOUNTER — Ambulatory Visit: Payer: Medicare Other | Admitting: Oncology

## 2017-02-25 NOTE — Progress Notes (Signed)
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  INTERVAL HISTORY: Patient returns to clinic today for further evaluation. She was supposed to initiate Ibrance, but the medication was never delivered. She currently feels well and is asymptomatic. She continues to tolerate letrozole well without significant side effects.  She has no neurologic complaints.  She denies any chest pain or shortness of breath. She has a fair appetite and denies weight loss. She denies vomiting or constipation.  She has no melena or hematochezia.  She has no urinary complaints.  Patient offers no further specific complaints today.  REVIEW OF SYSTEMS:   Review of Systems  Constitutional: Negative for fever, malaise/fatigue and weight loss.  Respiratory: Negative.  Negative for cough and shortness of breath.   Cardiovascular: Negative.  Negative for chest pain and leg swelling.  Gastrointestinal: Negative for abdominal pain, blood in stool and melena.  Genitourinary: Negative.   Musculoskeletal: Negative for back pain and joint pain.  Skin: Negative.  Negative for rash.  Neurological: Negative for sensory change and weakness.  Psychiatric/Behavioral: The patient is nervous/anxious.     As per HPI. Otherwise, a complete review of systems is negative.  PAST MEDICAL HISTORY: Past Medical History:  Diagnosis Date  . Anxiety   . Arthritis    "back" (04/03/2016)  . Breast cancer ([place]) 2012    no lumpectomy. Treating with meds- Right  . Cancer ([place])    small intestines 2012, gallbladder 2016  . Chronic lower back pain   . COPD (chronic  obstructive pulmonary disease) ([place])    "use inhalers for COPD that dx'd at one time" (04/03/2016)  . Family history of adverse reaction to anesthesia    daughter gets nauseated  . GERD (gastroesophageal reflux disease)   . High cholesterol   . Hypertension   . Hypothyroidism   . Skin cancer    "right neck; mid forehead"    PAST SURGICAL HISTORY: Past Surgical History:  Procedure Laterality Date  . ABDOMINAL HERNIA REPAIR     "after they took tumor out of my colon I developed a hernia"  . ABDOMINAL HYSTERECTOMY    . BACK SURGERY    . BREAST BIOPSY Right 2012   positive  . BREAST BIOPSY Right 2014   positive  . BREAST BIOPSY Right 06/26/2016   US guided - waiting for pathology  . BREAST EXCISIONAL BIOPSY Right 1995   benign  . CARPAL TUNNEL RELEASE Left   . DILATION AND CURETTAGE OF UTERUS     S/P miscarriage  . IR GENERIC HISTORICAL  04/04/2016   IR LUMBAR DISC ASPIRATION W/IMG GUIDE 04/04/2016 [name], MD [facility]  . JOINT REPLACEMENT    . LAPAROSCOPIC CHOLECYSTECTOMY  2016  . SKIN CANCER EXCISION     "right neck; mid forehead"  . TOTAL HIP ARTHROPLASTY Right 10/03/2015   Procedure: TOTAL HIP ARTHROPLASTY ANTERIOR APPROACH;  Surgeon: Hessie Knows, MD;  Location: [facility] ORS;  Service: Orthopedics;  Laterality: Right;  . TOTAL HIP ARTHROPLASTY Left 07/25/2016   Procedure: TOTAL HIP ARTHROPLASTY ANTERIOR APPROACH;  Surgeon: Hessie Knows, MD;  Location: [facility] ORS;  Service: Orthopedics;  Laterality: Left;  . TUBAL LIGATION    .  TUMOR EXCISION  2012   "small intestine; came from the breast cancer"    FAMILY HISTORY: Reviewed and unchanged. No reported history of breast cancer or other chronic diseases.     ADVANCED DIRECTIVES:    HEALTH MAINTENANCE: Social History  Substance Use Topics  . Smoking status: Current Every Day Smoker    Packs/day: 1.00    Years: 54.00    Types: Cigarettes  . Smokeless tobacco: Never Used     Comment: not ready to quit  .  Alcohol use No     Colonoscopy:  PAP:  Bone density:  Lipid panel:  Allergies  Allergen Reactions  . Carafate [Sucralfate] Nausea Only  . Diclofenac Sodium Nausea Only  . Erythromycin Diarrhea and Nausea And Vomiting  . Adhesive [Tape] Itching and Rash  . Wellbutrin [Bupropion] Itching and Rash    Current Outpatient Prescriptions  Medication Sig Dispense Refill  . acetaminophen (TYLENOL) 500 MG tablet Take 1,000 mg by mouth daily as needed for mild pain or fever.     . cetirizine (ZYRTEC) 10 MG tablet Take 10 mg by mouth daily as needed for allergies. Reported on 10/03/2015    . citalopram (CELEXA) 20 MG tablet Take 20 mg by mouth every morning.     . dicyclomine (BENTYL) 10 MG capsule Take 10 mg by mouth 3 (three) times daily as needed for spasms.    . Fluticasone-Salmeterol (ADVAIR) 100-50 MCG/DOSE AEPB Inhale 1 puff into the lungs every morning.     Marland Kitchen letrozole (FEMARA) 2.5 MG tablet TAKE 1 TABLET ONE TIME DAILY 90 tablet 4  . levothyroxine (SYNTHROID, LEVOTHROID) 100 MCG tablet Take 100 mcg by mouth daily before breakfast.     . montelukast (SINGULAIR) 10 MG tablet Take 10 mg by mouth at bedtime.     . Multiple Vitamins-Minerals (CENTRUM SILVER PO) Take 1 tablet by mouth every morning.    Marland Kitchen omeprazole (PRILOSEC) 20 MG capsule Take 20 mg by mouth every morning.     . palbociclib (IBRANCE) 125 MG capsule Take one tablet daily for 21 days with 7 days off. Take whole with food. 21 capsule 2  . polyethylene glycol (MIRALAX / GLYCOLAX) packet Take 17 g by mouth daily. (Patient taking differently: Take 17 g by mouth daily as needed for mild constipation. ) 14 each 0  . pravastatin (PRAVACHOL) 40 MG tablet Take 40 mg by mouth at bedtime.     Marland Kitchen zolpidem (AMBIEN) 10 MG tablet Take 1 tablet (10 mg total) by mouth at bedtime as needed. for sleep 30 tablet 2   No current facility-administered medications for this visit.     OBJECTIVE: Vitals:   02/27/17 1355  BP: (!) 174/74  Pulse: 65    Temp: 97.7 F (36.5 C)     Body mass index is 24.84 kg/m.    ECOG FS:1 - Symptomatic but completely ambulatory  General: Well-developed, well-nourished, no acute distress. Eyes: Pink conjunctiva, anicteric sclera. Breasts: Right breast with new lump at approximately 1:00. Left breast without lumps or masses. Exam deferred today. Lungs: Clear to auscultation bilaterally. Heart: Regular rate and rhythm. No rubs, murmurs, or gallops. Abdomen: Soft, nontender, nondistended. No organomegaly noted, normoactive bowel sounds. Musculoskeletal: No edema, cyanosis, or clubbing.   Neuro: Alert, answering all questions appropriately. Cranial nerves grossly intact. Skin: No rashes or petechiae noted. Psych: Normal affect.   LAB RESULTS:  Lab Results  Component Value Date   NA 137 07/27/2016   K 3.8 07/27/2016   CL  104 07/27/2016   CO2 28 07/27/2016   GLUCOSE 118 (H) 07/27/2016   BUN 15 07/27/2016   CREATININE 0.81 07/27/2016   CALCIUM 8.6 (L) 07/27/2016   PROT 7.2 05/30/2016   ALBUMIN 4.2 05/30/2016   AST 24 05/30/2016   ALT 17 05/30/2016   ALKPHOS 89 05/30/2016   BILITOT 0.5 05/30/2016   GFRNONAA >60 07/27/2016   GFRAA >60 07/27/2016    Lab Results  Component Value Date   WBC 6.2 07/27/2016   NEUTROABS 4.4 02/26/2016   HGB 10.2 (L) 07/27/2016   HCT 29.1 (L) 07/27/2016   MCV 89.6 07/27/2016   PLT 180 07/27/2016   Lab Results  Component Value Date   LABCA2 61.3 (H) 12/05/2016     STUDIES: No results found.  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  PLAN:    1.  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 Recent biopsy of right breast consistent with patient's known primary. Newly palpated lump on right breast is also likely progression of  disease. After lengthy discussion with the patient, she has agreed to add Ibrance 125 mg for 21 days with 7 days off to her treatment regimen once the medication arrives. She has been instructed to continue letrozole. Return to clinic in 6 weeks with repeat laboratory work and further evaluation.  2.  Hip/back pain: Significantly improved. Patient is status post bilateral hip replacement. 3.  Anemia: Mild, monitor.  Approximately 20 minutes was spent in discussion of which grade and 50% was consultation.  Patient expressed understanding and was in agreement with this plan. She also understands that She can call clinic at any time with any questions, concerns, or complaints.    Lloyd Huger, MD   03/02/2017 7:28 AM

## 2017-02-27 ENCOUNTER — Inpatient Hospital Stay (HOSPITAL_BASED_OUTPATIENT_CLINIC_OR_DEPARTMENT_OTHER): Payer: Medicare Other | Admitting: Oncology

## 2017-02-27 ENCOUNTER — Inpatient Hospital Stay: Payer: Medicare Other | Attending: Oncology

## 2017-02-27 VITALS — BP 174/74 | HR 65 | Temp 97.7°F | Wt 144.7 lb

## 2017-02-27 DIAGNOSIS — G8929 Other chronic pain: Secondary | ICD-10-CM | POA: Insufficient documentation

## 2017-02-27 DIAGNOSIS — F17210 Nicotine dependence, cigarettes, uncomplicated: Secondary | ICD-10-CM | POA: Diagnosis not present

## 2017-02-27 DIAGNOSIS — C50411 Malignant neoplasm of upper-outer quadrant of right female breast: Secondary | ICD-10-CM | POA: Diagnosis not present

## 2017-02-27 DIAGNOSIS — E039 Hypothyroidism, unspecified: Secondary | ICD-10-CM | POA: Diagnosis not present

## 2017-02-27 DIAGNOSIS — D649 Anemia, unspecified: Secondary | ICD-10-CM | POA: Insufficient documentation

## 2017-02-27 DIAGNOSIS — E7800 Pure hypercholesterolemia, unspecified: Secondary | ICD-10-CM

## 2017-02-27 DIAGNOSIS — J449 Chronic obstructive pulmonary disease, unspecified: Secondary | ICD-10-CM

## 2017-02-27 DIAGNOSIS — C7889 Secondary malignant neoplasm of other digestive organs: Secondary | ICD-10-CM

## 2017-02-27 DIAGNOSIS — M129 Arthropathy, unspecified: Secondary | ICD-10-CM | POA: Diagnosis not present

## 2017-02-27 DIAGNOSIS — K219 Gastro-esophageal reflux disease without esophagitis: Secondary | ICD-10-CM | POA: Diagnosis not present

## 2017-02-27 DIAGNOSIS — F419 Anxiety disorder, unspecified: Secondary | ICD-10-CM | POA: Diagnosis not present

## 2017-02-27 DIAGNOSIS — C50911 Malignant neoplasm of unspecified site of right female breast: Secondary | ICD-10-CM

## 2017-02-27 DIAGNOSIS — Z170 Estrogen receptor positive status [ER+]: Secondary | ICD-10-CM | POA: Diagnosis not present

## 2017-02-27 DIAGNOSIS — C7951 Secondary malignant neoplasm of bone: Secondary | ICD-10-CM

## 2017-02-27 DIAGNOSIS — I10 Essential (primary) hypertension: Secondary | ICD-10-CM | POA: Insufficient documentation

## 2017-02-27 DIAGNOSIS — C784 Secondary malignant neoplasm of small intestine: Secondary | ICD-10-CM | POA: Insufficient documentation

## 2017-02-27 DIAGNOSIS — M545 Low back pain: Secondary | ICD-10-CM | POA: Insufficient documentation

## 2017-02-27 DIAGNOSIS — Z85828 Personal history of other malignant neoplasm of skin: Secondary | ICD-10-CM

## 2017-02-27 DIAGNOSIS — Z79811 Long term (current) use of aromatase inhibitors: Secondary | ICD-10-CM | POA: Insufficient documentation

## 2017-02-28 LAB — CANCER ANTIGEN 27.29: CA 27.29: 66.8 U/mL — ABNORMAL HIGH (ref 0.0–38.6)

## 2017-03-26 ENCOUNTER — Other Ambulatory Visit: Payer: Self-pay | Admitting: *Deleted

## 2017-03-26 MED ORDER — PALBOCICLIB 125 MG PO CAPS
ORAL_CAPSULE | ORAL | 2 refills | Status: DC
Start: 2017-03-26 — End: 2017-03-28

## 2017-03-28 ENCOUNTER — Encounter: Payer: Self-pay | Admitting: *Deleted

## 2017-03-28 ENCOUNTER — Other Ambulatory Visit: Payer: Self-pay | Admitting: *Deleted

## 2017-03-28 MED ORDER — PALBOCICLIB 125 MG PO CAPS: ORAL_CAPSULE | ORAL | 2 refills | Status: DC

## 2017-03-28 NOTE — Patient Instructions (Signed)
New RX for [employer] faxed to [facility] program, fax # [phone]

## 2017-04-08 NOTE — Progress Notes (Signed)
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  INTERVAL HISTORY: Patient returns to clinic today for further evaluation. She has now initiated [place] and is tolerating treatment well. She currently feels well and is asymptomatic. She continues to tolerate letrozole well without significant side effects.  She has no neurologic complaints.  She denies any chest pain or shortness of breath. She has a fair appetite and denies weight loss. She denies vomiting or constipation.  She has no melena or hematochezia.  She has no urinary complaints.  Patient offers no further specific complaints today.  REVIEW OF SYSTEMS:   Review of Systems  Constitutional: Negative for fever, malaise/fatigue and weight loss.  Respiratory: Negative.  Negative for cough and shortness of breath.   Cardiovascular: Negative.  Negative for chest pain and leg swelling.  Gastrointestinal: Negative for abdominal pain, blood in stool and melena.  Genitourinary: Negative.   Musculoskeletal: Negative for back pain and joint pain.  Skin: Negative.  Negative for rash.  Neurological: Negative for sensory change and weakness.  Psychiatric/Behavioral: The patient is nervous/anxious.     As per HPI. Otherwise, a complete review of systems is negative.  PAST MEDICAL HISTORY: Past Medical History:  Diagnosis Date  . Anxiety   . Arthritis    "back" (04/03/2016)  . Breast cancer ([place]) 2012    no lumpectomy. Treating with meds- Right  . Cancer ([place])    small intestines 2012, gallbladder 2016  . Chronic lower back pain   . COPD (chronic obstructive  pulmonary disease) ([place])    "use inhalers for COPD that dx'd at one time" (04/03/2016)  . Family history of adverse reaction to anesthesia    daughter gets nauseated  . GERD (gastroesophageal reflux disease)   . High cholesterol   . Hypertension   . Hypothyroidism   . Skin cancer    "right neck; mid forehead"    PAST SURGICAL HISTORY: Past Surgical History:  Procedure Laterality Date  . ABDOMINAL HERNIA REPAIR     "after they took tumor out of my colon I developed a hernia"  . ABDOMINAL HYSTERECTOMY    . BACK SURGERY    . BREAST BIOPSY Right 2012   positive  . BREAST BIOPSY Right 2014   positive  . BREAST BIOPSY Right 06/26/2016   US guided - waiting for pathology  . BREAST EXCISIONAL BIOPSY Right 1995   benign  . CARPAL TUNNEL RELEASE Left   . DILATION AND CURETTAGE OF UTERUS     S/P miscarriage  . IR GENERIC HISTORICAL  04/04/2016   IR LUMBAR DISC ASPIRATION W/IMG GUIDE 04/04/2016 [name], MD [facility]  . JOINT REPLACEMENT    . LAPAROSCOPIC CHOLECYSTECTOMY  2016  . SKIN CANCER EXCISION     "right neck; mid forehead"  . TOTAL HIP ARTHROPLASTY Right 10/03/2015   Procedure: TOTAL HIP ARTHROPLASTY ANTERIOR APPROACH;  Surgeon: Hessie Knows, MD;  Location: [facility] ORS;  Service: Orthopedics;  Laterality: Right;  . TOTAL HIP ARTHROPLASTY Left 07/25/2016   Procedure: TOTAL HIP ARTHROPLASTY ANTERIOR APPROACH;  Surgeon: Hessie Knows, MD;  Location: [facility] ORS;  Service: Orthopedics;  Laterality: Left;  . TUBAL LIGATION    .  TUMOR EXCISION  2012   "small intestine; came from the breast cancer"    FAMILY HISTORY: Reviewed and unchanged. No reported history of breast cancer or other chronic diseases.     ADVANCED DIRECTIVES:    HEALTH MAINTENANCE: Social History  Substance Use Topics  . Smoking status: Current Every Day Smoker    Packs/day: 1.00    Years: 54.00    Types: Cigarettes  . Smokeless tobacco: Never Used     Comment: not ready to quit  . Alcohol use No       Colonoscopy:  PAP:  Bone density:  Lipid panel:  Allergies  Allergen Reactions  . Carafate [Sucralfate] Nausea Only  . Diclofenac Sodium Nausea Only  . Erythromycin Diarrhea and Nausea And Vomiting  . Adhesive [Tape] Itching and Rash  . Wellbutrin [Bupropion] Itching and Rash    Current Outpatient Prescriptions  Medication Sig Dispense Refill  . acetaminophen (TYLENOL) 500 MG tablet Take 1,000 mg by mouth daily as needed for mild pain or fever.     . cetirizine (ZYRTEC) 10 MG tablet Take 10 mg by mouth daily as needed for allergies. Reported on 10/03/2015    . citalopram (CELEXA) 20 MG tablet Take 20 mg by mouth every morning.     . dicyclomine (BENTYL) 10 MG capsule Take 10 mg by mouth 3 (three) times daily as needed for spasms.    Marland Kitchen FLUTICASONE PROPIONATE EX Apply topically.    . Fluticasone-Salmeterol (ADVAIR) 100-50 MCG/DOSE AEPB Inhale 1 puff into the lungs every morning.     Marland Kitchen letrozole (FEMARA) 2.5 MG tablet TAKE 1 TABLET ONE TIME DAILY 90 tablet 4  . levothyroxine (SYNTHROID, LEVOTHROID) 100 MCG tablet Take 100 mcg by mouth daily before breakfast.     . montelukast (SINGULAIR) 10 MG tablet Take 10 mg by mouth at bedtime.     . Multiple Vitamins-Minerals (CENTRUM SILVER PO) Take 1 tablet by mouth every morning.    Marland Kitchen omeprazole (PRILOSEC) 20 MG capsule Take 20 mg by mouth every morning.     . palbociclib (IBRANCE) 125 MG capsule Take one tablet daily for 21 days with 7 days off. Take whole with food. 21 capsule 2  . pravastatin (PRAVACHOL) 40 MG tablet Take 40 mg by mouth at bedtime.     Marland Kitchen PREDNISONE PO Take by mouth.    . valsartan-hydrochlorothiazide (DIOVAN-HCT) 80-12.5 MG tablet Take by mouth.    . zolpidem (AMBIEN) 10 MG tablet Take 1 tablet (10 mg total) by mouth at bedtime as needed. for sleep 30 tablet 2  . polyethylene glycol (MIRALAX / GLYCOLAX) packet Take 17 g by mouth daily. (Patient not taking: Reported on 04/10/2017) 14 each 0   No current  facility-administered medications for this visit.     OBJECTIVE: Vitals:   04/10/17 1442  BP: 127/67  Pulse: 72  Resp: 18  Temp: 97.9 F (36.6 C)     Body mass index is 24.36 kg/m.    ECOG FS:1 - Symptomatic but completely ambulatory  General: Well-developed, well-nourished, no acute distress. Eyes: Pink conjunctiva, anicteric sclera. Breasts: Exam deferred today. Lungs: Clear to auscultation bilaterally. Heart: Regular rate and rhythm. No rubs, murmurs, or gallops. Abdomen: Soft, nontender, nondistended. No organomegaly noted, normoactive bowel sounds. Musculoskeletal: No edema, cyanosis, or clubbing.   Neuro: Alert, answering all questions appropriately. Cranial nerves grossly intact. Skin: No rashes or petechiae noted. Psych: Normal affect.   LAB RESULTS:  Lab Results  Component Value Date   NA 137  04/10/2017   K 4.0 04/10/2017   CL 100 (L) 04/10/2017   CO2 28 04/10/2017   GLUCOSE 145 (H) 04/10/2017   BUN 39 (H) 04/10/2017   CREATININE 1.42 (H) 04/10/2017   CALCIUM 9.3 04/10/2017   PROT 7.5 04/10/2017   ALBUMIN 4.5 04/10/2017   AST 28 04/10/2017   ALT 30 04/10/2017   ALKPHOS 79 04/10/2017   BILITOT 0.7 04/10/2017   GFRNONAA 36 (L) 04/10/2017   GFRAA 42 (L) 04/10/2017    Lab Results  Component Value Date   WBC 4.8 04/10/2017   NEUTROABS 4.2 04/10/2017   HGB 12.9 04/10/2017   HCT 36.7 04/10/2017   MCV 94.6 04/10/2017   PLT 304 04/10/2017   Lab Results  Component Value Date   LABCA2 61.3 (H) 12/05/2016     STUDIES: No results found.  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  PLAN:    1. 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 Recent biopsy of right breast consistent with patient's known primary. Newly palpated lump on right  breast is also likely progression of disease. Patient initiated [place] and has completed one cycle. She tolerates it without side effects and continues letrozole. Will plan to repeat imaging with PET scan. Last mammogram was 06/10/16 and was bi-rads category 4. Would normally re-image in October of this year but given anticipated PET scan, will defer at this time.  Will plan to see her back in 3 weeks for labs, discussion of PET results, and further evaluation.  2.  Hip/back pain: Significantly improved. Patient is status post bilateral hip replacement. 3.  Anemia: Mild, monitor. 4. Acute Kidney - creatinine elevated today to 1.42 compared to baseline of 0.8. No recent labs to compare to. Will plan to repeat metabolic panel in 3 weeks. If levels remain elevated, will consider referral to nephrology.   Approximately 30 minutes was spent in discussion of which grade and 50% was consultation.  Patient expressed understanding and was in agreement with this plan. She also understands that She can call clinic at any time with any questions, concerns, or complaints.   Jamaury Gumz G. Zenia Resides, NP 04/10/17 3:34 PM  Patient was seen and evaluated independently and I agree with the assessment and plan as dictated above.  Lloyd Huger, MD 04/12/17 8:09 AM

## 2017-04-10 ENCOUNTER — Inpatient Hospital Stay: Payer: Medicare Other | Attending: Oncology

## 2017-04-10 ENCOUNTER — Inpatient Hospital Stay (HOSPITAL_BASED_OUTPATIENT_CLINIC_OR_DEPARTMENT_OTHER): Payer: Medicare Other | Admitting: Oncology

## 2017-04-10 VITALS — BP 127/67 | HR 72 | Temp 97.9°F | Resp 18 | Wt 141.9 lb

## 2017-04-10 DIAGNOSIS — M129 Arthropathy, unspecified: Secondary | ICD-10-CM | POA: Insufficient documentation

## 2017-04-10 DIAGNOSIS — Z79899 Other long term (current) drug therapy: Secondary | ICD-10-CM | POA: Diagnosis not present

## 2017-04-10 DIAGNOSIS — C784 Secondary malignant neoplasm of small intestine: Secondary | ICD-10-CM

## 2017-04-10 DIAGNOSIS — E039 Hypothyroidism, unspecified: Secondary | ICD-10-CM

## 2017-04-10 DIAGNOSIS — F17210 Nicotine dependence, cigarettes, uncomplicated: Secondary | ICD-10-CM | POA: Diagnosis not present

## 2017-04-10 DIAGNOSIS — F419 Anxiety disorder, unspecified: Secondary | ICD-10-CM | POA: Insufficient documentation

## 2017-04-10 DIAGNOSIS — Z79811 Long term (current) use of aromatase inhibitors: Secondary | ICD-10-CM | POA: Diagnosis not present

## 2017-04-10 DIAGNOSIS — E7800 Pure hypercholesterolemia, unspecified: Secondary | ICD-10-CM | POA: Insufficient documentation

## 2017-04-10 DIAGNOSIS — D649 Anemia, unspecified: Secondary | ICD-10-CM | POA: Diagnosis not present

## 2017-04-10 DIAGNOSIS — K219 Gastro-esophageal reflux disease without esophagitis: Secondary | ICD-10-CM

## 2017-04-10 DIAGNOSIS — Z170 Estrogen receptor positive status [ER+]: Secondary | ICD-10-CM | POA: Diagnosis not present

## 2017-04-10 DIAGNOSIS — M545 Low back pain: Secondary | ICD-10-CM

## 2017-04-10 DIAGNOSIS — Z85828 Personal history of other malignant neoplasm of skin: Secondary | ICD-10-CM | POA: Diagnosis not present

## 2017-04-10 DIAGNOSIS — C7889 Secondary malignant neoplasm of other digestive organs: Secondary | ICD-10-CM

## 2017-04-10 DIAGNOSIS — M549 Dorsalgia, unspecified: Secondary | ICD-10-CM

## 2017-04-10 DIAGNOSIS — C50411 Malignant neoplasm of upper-outer quadrant of right female breast: Secondary | ICD-10-CM | POA: Insufficient documentation

## 2017-04-10 DIAGNOSIS — J449 Chronic obstructive pulmonary disease, unspecified: Secondary | ICD-10-CM | POA: Diagnosis not present

## 2017-04-10 DIAGNOSIS — I10 Essential (primary) hypertension: Secondary | ICD-10-CM | POA: Diagnosis not present

## 2017-04-10 DIAGNOSIS — C7951 Secondary malignant neoplasm of bone: Secondary | ICD-10-CM

## 2017-04-10 DIAGNOSIS — C50911 Malignant neoplasm of unspecified site of right female breast: Secondary | ICD-10-CM

## 2017-04-10 LAB — COMPREHENSIVE METABOLIC PANEL
ALK PHOS: 79 U/L (ref 38–126)
ALT: 30 U/L (ref 14–54)
AST: 28 U/L (ref 15–41)
Albumin: 4.5 g/dL (ref 3.5–5.0)
Anion gap: 9 (ref 5–15)
BILIRUBIN TOTAL: 0.7 mg/dL (ref 0.3–1.2)
BUN: 39 mg/dL — AB (ref 6–20)
CHLORIDE: 100 mmol/L — AB (ref 101–111)
CO2: 28 mmol/L (ref 22–32)
Calcium: 9.3 mg/dL (ref 8.9–10.3)
Creatinine, Ser: 1.42 mg/dL — ABNORMAL HIGH (ref 0.44–1.00)
GFR, EST AFRICAN AMERICAN: 42 mL/min — AB (ref >60)
GFR, EST NON AFRICAN AMERICAN: 36 mL/min — AB (ref >60)
Glucose, Bld: 145 mg/dL — ABNORMAL HIGH (ref 65–99)
Potassium: 4 mmol/L (ref 3.5–5.1)
Sodium: 137 mmol/L (ref 135–145)
TOTAL PROTEIN: 7.5 g/dL (ref 6.5–8.1)

## 2017-04-10 LAB — CBC WITH DIFFERENTIAL/PLATELET
BASOS ABS: 0 10*3/uL (ref 0–0.1)
Basophils Relative: 0 %
Eosinophils Absolute: 0 10*3/uL (ref 0–0.7)
Eosinophils Relative: 0 %
HEMATOCRIT: 36.7 % (ref 35.0–47.0)
HEMOGLOBIN: 12.9 g/dL (ref 12.0–16.0)
LYMPHS ABS: 0.4 10*3/uL — AB (ref 1.0–3.6)
LYMPHS PCT: 9 %
MCH: 33.2 pg (ref 26.0–34.0)
MCHC: 35.1 g/dL (ref 32.0–36.0)
MCV: 94.6 fL (ref 80.0–100.0)
Monocytes Absolute: 0.2 10*3/uL (ref 0.2–0.9)
Monocytes Relative: 3 %
NEUTROS ABS: 4.2 10*3/uL (ref 1.4–6.5)
Neutrophils Relative %: 88 %
Platelets: 304 10*3/uL (ref 150–440)
RBC: 3.88 MIL/uL (ref 3.80–5.20)
RDW: 17.5 % — ABNORMAL HIGH (ref 11.5–14.5)
WBC: 4.8 10*3/uL (ref 3.6–11.0)

## 2017-04-11 LAB — CANCER ANTIGEN 27.29: CAN 27.29: 88.4 U/mL — AB (ref 0.0–38.6)

## 2017-04-14 DIAGNOSIS — C50919 Malignant neoplasm of unspecified site of unspecified female breast: Secondary | ICD-10-CM | POA: Insufficient documentation

## 2017-04-14 DIAGNOSIS — C799 Secondary malignant neoplasm of unspecified site: Secondary | ICD-10-CM

## 2017-04-17 ENCOUNTER — Other Ambulatory Visit: Payer: Self-pay | Admitting: *Deleted

## 2017-04-17 DIAGNOSIS — C50911 Malignant neoplasm of unspecified site of right female breast: Secondary | ICD-10-CM

## 2017-04-17 DIAGNOSIS — C801 Malignant (primary) neoplasm, unspecified: Secondary | ICD-10-CM

## 2017-04-17 MED ORDER — ZOLPIDEM TARTRATE 10 MG PO TABS: 10.0000 mg | ORAL_TABLET | Freq: Every evening | ORAL | 2 refills | Status: DC | PRN

## 2017-04-22 ENCOUNTER — Other Ambulatory Visit: Payer: Self-pay | Admitting: Oncology

## 2017-04-23 ENCOUNTER — Telehealth: Payer: Self-pay | Admitting: Pharmacist

## 2017-04-23 ENCOUNTER — Other Ambulatory Visit: Payer: Self-pay | Admitting: *Deleted

## 2017-04-23 DIAGNOSIS — C50911 Malignant neoplasm of unspecified site of right female breast: Secondary | ICD-10-CM

## 2017-04-23 MED ORDER — PALBOCICLIB 125 MG PO CAPS: ORAL_CAPSULE | ORAL | 2 refills | Status: DC

## 2017-04-23 NOTE — Telephone Encounter (Addendum)
Oral Oncology Pharmacist Encounter  Received refill prescription for palbociclib [facility]) for the treatment of metastatic breast cancer in conjunction with letrozole, planned duration until disease progression or unacceptable drug toxicity. Medication started 03/13/17  CBC/CMP from 04/10/17 assessed, no relevant lab abnormalities to prevent medication continuation. Prescription dose and frequency assessed.   Current medication list in Epic reviewed, no relevant DDIs with identified.  Patient is getting medication from [place] assistance program. When I called [facility] for fax number they stated the patient had a refill on her current Rx and they were able to process Rx for shipment today. Patient should have medication by Friday. New Rx received today was faxed to [facility] [place] [phone]) for future fills.  Called patient to inform her. She knows to expect the medication on Friday.  Darl Pikes, PharmD, BCPS Hematology/Oncology Clinical Pharmacist [facility] [phone]  04/23/2017 2:24 PM

## 2017-04-23 NOTE — Telephone Encounter (Signed)
Patient called and states she needs her Ibrance refill sent to [facility]. She took her last pill for this cycle and has a 7 day break before she restarts it

## 2017-04-29 ENCOUNTER — Ambulatory Visit
Admission: RE | Admit: 2017-04-29 | Discharge: 2017-04-29 | Disposition: A | Discharge status: Home / Self Care | Payer: Medicare Other | Source: Ambulatory Visit | Attending: Nurse Practitioner | Admitting: Nurse Practitioner

## 2017-04-29 DIAGNOSIS — I700 Atherosclerosis of aorta: Secondary | ICD-10-CM | POA: Insufficient documentation

## 2017-04-29 DIAGNOSIS — I2510 Atherosclerotic heart disease of native coronary artery without angina pectoris: Secondary | ICD-10-CM | POA: Insufficient documentation

## 2017-04-29 DIAGNOSIS — R918 Other nonspecific abnormal finding of lung field: Secondary | ICD-10-CM | POA: Insufficient documentation

## 2017-04-29 DIAGNOSIS — R937 Abnormal findings on diagnostic imaging of other parts of musculoskeletal system: Secondary | ICD-10-CM | POA: Insufficient documentation

## 2017-04-29 DIAGNOSIS — C50911 Malignant neoplasm of unspecified site of right female breast: Secondary | ICD-10-CM | POA: Diagnosis present

## 2017-04-29 LAB — GLUCOSE, CAPILLARY: GLUCOSE-CAPILLARY: 90 mg/dL (ref 65–99)

## 2017-04-29 MED ORDER — FLUDEOXYGLUCOSE F - 18 (FDG) INJECTION
13.1700 | Freq: Once | INTRAVENOUS | Status: AC | PRN
  Administered 2017-04-29: 13.17 via INTRAVENOUS

## 2017-05-06 NOTE — Progress Notes (Signed)
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  INTERVAL HISTORY: Patient returns to clinic today for further evaluation and discussion of her imaging results. She is tolerating Ibrance and letrozole well. She reports a rash with her first cycle of treatment that resolved with over-the-counter steroid cream. It did not recur when she initiated cycle 2. She currently feels well and is asymptomatic. She has no neurologic complaints. She denies any chest pain or shortness of breath. She has a fair appetite and denies weight loss. She denies vomiting or constipation.  She has no melena or hematochezia.  She has no urinary complaints.  Patient offers no further specific complaints today.  REVIEW OF SYSTEMS:   Review of Systems  Constitutional: Negative for fever, malaise/fatigue and weight loss.  Respiratory: Negative.  Negative for cough and shortness of breath.   Cardiovascular: Negative.  Negative for chest pain and leg swelling.  Gastrointestinal: Negative for abdominal pain, blood in stool and melena.  Genitourinary: Negative.   Musculoskeletal: Negative for back pain and joint pain.  Skin: Negative.  Negative for rash.  Neurological: Negative for sensory change and weakness.  Psychiatric/Behavioral: The patient is nervous/anxious.     As per HPI. Otherwise, a complete review of systems is negative.  PAST MEDICAL HISTORY: Past Medical History:  Diagnosis Date  . Anxiety   . Arthritis    "back" (04/03/2016)  . Breast cancer ([place]) 2012    no lumpectomy. Treating with meds- Right  . Cancer ([place])    small  intestines 2012, gallbladder 2016  . Chronic lower back pain   . COPD (chronic obstructive pulmonary disease) ([place])    "use inhalers for COPD that dx'd at one time" (04/03/2016)  . Family history of adverse reaction to anesthesia    daughter gets nauseated  . GERD (gastroesophageal reflux disease)   . High cholesterol   . Hypertension   . Hypothyroidism   . Skin cancer    "right neck; mid forehead"    PAST SURGICAL HISTORY: Past Surgical History:  Procedure Laterality Date  . ABDOMINAL HERNIA REPAIR     "after they took tumor out of my colon I developed a hernia"  . ABDOMINAL HYSTERECTOMY    . BACK SURGERY    . BREAST BIOPSY Right 2012   positive  . BREAST BIOPSY Right 2014   positive  . BREAST BIOPSY Right 06/26/2016   US guided - waiting for pathology  . BREAST EXCISIONAL BIOPSY Right 1995   benign  . CARPAL TUNNEL RELEASE Left   . DILATION AND CURETTAGE OF UTERUS     S/P miscarriage  . IR GENERIC HISTORICAL  04/04/2016   IR LUMBAR DISC ASPIRATION W/IMG GUIDE 04/04/2016 [name], MD [facility]  . JOINT REPLACEMENT    . LAPAROSCOPIC CHOLECYSTECTOMY  2016  . SKIN CANCER EXCISION     "right neck; mid forehead"  . TOTAL HIP ARTHROPLASTY Right 10/03/2015   Procedure: TOTAL HIP ARTHROPLASTY ANTERIOR APPROACH;  Surgeon: Hessie Knows, MD;  Location: [facility] ORS;  Service: Orthopedics;  Laterality: Right;  . TOTAL HIP ARTHROPLASTY Left 07/25/2016   Procedure: TOTAL HIP ARTHROPLASTY ANTERIOR APPROACH;  Surgeon: Hessie Knows, MD;  Location:  [facility] ORS;  Service: Orthopedics;  Laterality: Left;  . TUBAL LIGATION    . TUMOR EXCISION  2012   "small intestine; came from the breast cancer"    FAMILY HISTORY: Reviewed and unchanged. No reported history of breast cancer or other chronic diseases.     ADVANCED DIRECTIVES:    HEALTH MAINTENANCE: Social History  Substance Use Topics  . Smoking status: Current Every Day Smoker    Packs/day: 1.00    Years: 54.00    Types:  Cigarettes  . Smokeless tobacco: Never Used     Comment: not ready to quit  . Alcohol use No     Colonoscopy:  PAP:  Bone density:  Lipid panel:  Allergies  Allergen Reactions  . Carafate [Sucralfate] Nausea Only  . Diclofenac Sodium Nausea Only  . Erythromycin Diarrhea and Nausea And Vomiting  . Adhesive [Tape] Itching and Rash  . Wellbutrin [Bupropion] Itching and Rash    Current Outpatient Prescriptions  Medication Sig Dispense Refill  . acetaminophen (TYLENOL) 500 MG tablet Take 1,000 mg by mouth daily as needed for mild pain or fever.     . cetirizine (ZYRTEC) 10 MG tablet Take 10 mg by mouth daily as needed for allergies. Reported on 10/03/2015    . citalopram (CELEXA) 20 MG tablet Take 20 mg by mouth every morning.     . dicyclomine (BENTYL) 10 MG capsule Take 10 mg by mouth 3 (three) times daily as needed for spasms.    Marland Kitchen FLUTICASONE PROPIONATE EX Apply topically.    . Fluticasone-Salmeterol (ADVAIR) 100-50 MCG/DOSE AEPB Inhale 1 puff into the lungs every morning.     Marland Kitchen letrozole (FEMARA) 2.5 MG tablet TAKE 1 TABLET EVERY DAY 90 tablet 4  . levothyroxine (SYNTHROID, LEVOTHROID) 100 MCG tablet Take 100 mcg by mouth daily before breakfast.     . montelukast (SINGULAIR) 10 MG tablet Take 10 mg by mouth at bedtime.     . Multiple Vitamins-Minerals (CENTRUM SILVER PO) Take 1 tablet by mouth every morning.    Marland Kitchen omeprazole (PRILOSEC) 20 MG capsule Take 20 mg by mouth every morning.     . palbociclib (IBRANCE) 125 MG capsule Take one tablet daily for 21 days with 7 days off. Take whole with food. 21 capsule 2  . polyethylene glycol (MIRALAX / GLYCOLAX) packet Take 17 g by mouth daily. 14 each 0  . pravastatin (PRAVACHOL) 40 MG tablet Take 40 mg by mouth at bedtime.     . valsartan-hydrochlorothiazide (DIOVAN-HCT) 80-12.5 MG tablet Take 1 tablet by mouth daily.    Marland Kitchen zolpidem (AMBIEN) 10 MG tablet Take 1 tablet (10 mg total) by mouth at bedtime as needed. for sleep 30 tablet 2  .  prochlorperazine (COMPAZINE) 10 MG tablet Take 1 tablet (10 mg total) by mouth every 6 (six) hours as needed for nausea or vomiting. 30 tablet 2   No current facility-administered medications for this visit.     OBJECTIVE: Vitals:   05/07/17 1031  BP: 116/73  Pulse: 76  Resp: 18  Temp: 98.5 F (36.9 C)     Body mass index is 24.92 kg/m.    ECOG FS:1 - Symptomatic but completely ambulatory  General: Well-developed, well-nourished, no acute distress. Eyes: Pink conjunctiva, anicteric sclera. Breasts: Exam deferred today. Lungs: Clear to auscultation bilaterally. Heart: Regular rate and rhythm. No rubs, murmurs, or gallops. Abdomen: Soft, nontender, nondistended. No organomegaly noted, normoactive bowel sounds. Musculoskeletal: No edema, cyanosis, or clubbing.   Neuro: Alert, answering all  questions appropriately. Cranial nerves grossly intact. Skin: No rashes or petechiae noted. Psych: Normal affect.   LAB RESULTS:  Lab Results  Component Value Date   NA 137 05/07/2017   K 4.1 05/07/2017   CL 100 (L) 05/07/2017   CO2 29 05/07/2017   GLUCOSE 99 05/07/2017   BUN 20 05/07/2017   CREATININE 1.51 (H) 05/07/2017   CALCIUM 9.4 05/07/2017   PROT 7.3 05/07/2017   ALBUMIN 3.9 05/07/2017   AST 23 05/07/2017   ALT 16 05/07/2017   ALKPHOS 75 05/07/2017   BILITOT 0.5 05/07/2017   GFRNONAA 34 (L) 05/07/2017   GFRAA 39 (L) 05/07/2017    Lab Results  Component Value Date   WBC 4.3 05/07/2017   NEUTROABS 3.6 05/07/2017   HGB 11.5 (L) 05/07/2017   HCT 31.9 (L) 05/07/2017   MCV 96.9 05/07/2017   PLT 304 05/07/2017   Lab Results  Component Value Date   LABCA2 61.3 (H) 12/05/2016     STUDIES: Nm Pet Image Restag (ps) Skull Base To Thigh  Result Date: 04/29/2017 CLINICAL DATA:  Subsequent treatment strategy for carcinoma of right breast with metastasis. Currently on chemotherapy. Radiation therapy 2-3 years ago. EXAM: NUCLEAR MEDICINE PET SKULL BASE TO THIGH TECHNIQUE: 13.1  mCi F-18 FDG was injected intravenously. Full-ring PET imaging was performed from the skull base to thigh after the radiotracer. CT data was obtained and used for attenuation correction and anatomic localization. FASTING BLOOD GLUCOSE:  Value: 90 mg/dl COMPARISON:  08/28/2015 PET. FINDINGS: NECK: No areas of abnormal hypermetabolism. No cervical adenopathy. Bilateral carotid atherosclerosis. CHEST: Similar low-level hypermetabolism within the right lower lobe, corresponding to vague dependent ground-glass. Example on image 116/ series 4. No thoracic nodal hypermetabolism identified. Tortuous thoracic aorta. Normal heart size, without pericardial effusion. Multivessel coronary artery atherosclerosis. Mild centrilobular emphysema. Biopsy or surgical changes in the right breast. ABDOMEN/PELVIS: Hypermetabolism within the left adrenal gland is without CT correlate. This measures a S.U.V. max of 3.0. Low-level hypermetabolism at the site of presumed ventral abdominal wall hernia repair, similar. Example image 175/series 4. Abdominal aortic atherosclerosis. Non aneurysmal dilatation of the infrarenal aorta at 2.0 cm. SKELETON: Low-level hypermetabolism about the shoulders is unchanged could be degenerative. Mildly heterogeneous activity again identified throughout the thoracic spine. A new focal area of hypermetabolism within the right-side of the sacrum corresponds to a lytic lesion. This measures 2.3 cm and a S.U.V. max of 5.0 on image 193/series 4. Interval bilateral hip arthroplasty. Heterogeneous areas of sclerosis throughout the pelvis and thoracic spine are felt to be similar. IMPRESSION: 1. No evidence of hypermetabolic soft tissue metastasis. 2. Heterogeneous marrow density, most apparent within the thoracic spine. This is suspicious for treated metastasis. A new hypermetabolic lytic focus within the right side of the sacrum is most consistent with active osseous metastasis. 3. Vague ground-glass density in the  right lower lobe with low-level hypermetabolism. Possibly related to remote inflammation. A component of drug toxicity could have this appearance. 4. Coronary artery atherosclerosis. Aortic Atherosclerosis (ICD10-I70.0). Electronically Signed   By: Abigail Miyamoto M.D.   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  PLAN:    1.  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 Recent biopsy of right  breast consistent with patient's known primary. PET scan results reviewed independently and reported as above with no evidence of hypermetabolic soft tissue metastasis. Continue Ibrance 125 mg for 21 days with 7 days off with letrozole. Return to clinic in 4 weeks with repeat laboratory work and further evaluation.  2.  Hip/back pain: Significantly improved. Patient is status post bilateral hip replacement. 3.  Anemia: Mild, monitor. 4.  Renal insufficiency: Mild, monitor.  Approximately 30 minutes was spent in discussion of which grade and 50% was consultation.  Patient expressed understanding and was in agreement with this plan. She also understands that She can call clinic at any time with any questions, concerns, or complaints.    Lloyd Huger, MD   05/09/2017 11:24 PM

## 2017-05-07 ENCOUNTER — Inpatient Hospital Stay (HOSPITAL_BASED_OUTPATIENT_CLINIC_OR_DEPARTMENT_OTHER): Payer: Medicare Other | Admitting: Oncology

## 2017-05-07 ENCOUNTER — Inpatient Hospital Stay: Payer: Medicare Other | Attending: Oncology

## 2017-05-07 VITALS — BP 116/73 | HR 76 | Temp 98.5°F | Resp 18 | Wt 145.2 lb

## 2017-05-07 DIAGNOSIS — F419 Anxiety disorder, unspecified: Secondary | ICD-10-CM | POA: Insufficient documentation

## 2017-05-07 DIAGNOSIS — Z170 Estrogen receptor positive status [ER+]: Secondary | ICD-10-CM | POA: Diagnosis not present

## 2017-05-07 DIAGNOSIS — C50411 Malignant neoplasm of upper-outer quadrant of right female breast: Secondary | ICD-10-CM | POA: Insufficient documentation

## 2017-05-07 DIAGNOSIS — G8929 Other chronic pain: Secondary | ICD-10-CM

## 2017-05-07 DIAGNOSIS — Z79899 Other long term (current) drug therapy: Secondary | ICD-10-CM

## 2017-05-07 DIAGNOSIS — E039 Hypothyroidism, unspecified: Secondary | ICD-10-CM | POA: Diagnosis not present

## 2017-05-07 DIAGNOSIS — C50911 Malignant neoplasm of unspecified site of right female breast: Secondary | ICD-10-CM

## 2017-05-07 DIAGNOSIS — Z7984 Long term (current) use of oral hypoglycemic drugs: Secondary | ICD-10-CM | POA: Diagnosis not present

## 2017-05-07 DIAGNOSIS — Z85828 Personal history of other malignant neoplasm of skin: Secondary | ICD-10-CM | POA: Diagnosis not present

## 2017-05-07 DIAGNOSIS — C784 Secondary malignant neoplasm of small intestine: Secondary | ICD-10-CM | POA: Diagnosis not present

## 2017-05-07 DIAGNOSIS — N289 Disorder of kidney and ureter, unspecified: Secondary | ICD-10-CM | POA: Insufficient documentation

## 2017-05-07 DIAGNOSIS — M545 Low back pain: Secondary | ICD-10-CM | POA: Diagnosis not present

## 2017-05-07 DIAGNOSIS — Z96643 Presence of artificial hip joint, bilateral: Secondary | ICD-10-CM | POA: Insufficient documentation

## 2017-05-07 DIAGNOSIS — C7951 Secondary malignant neoplasm of bone: Secondary | ICD-10-CM | POA: Diagnosis not present

## 2017-05-07 DIAGNOSIS — J449 Chronic obstructive pulmonary disease, unspecified: Secondary | ICD-10-CM

## 2017-05-07 DIAGNOSIS — K219 Gastro-esophageal reflux disease without esophagitis: Secondary | ICD-10-CM

## 2017-05-07 DIAGNOSIS — F17210 Nicotine dependence, cigarettes, uncomplicated: Secondary | ICD-10-CM | POA: Diagnosis not present

## 2017-05-07 DIAGNOSIS — E7800 Pure hypercholesterolemia, unspecified: Secondary | ICD-10-CM | POA: Insufficient documentation

## 2017-05-07 DIAGNOSIS — C7889 Secondary malignant neoplasm of other digestive organs: Secondary | ICD-10-CM | POA: Insufficient documentation

## 2017-05-07 DIAGNOSIS — D649 Anemia, unspecified: Secondary | ICD-10-CM | POA: Diagnosis not present

## 2017-05-07 LAB — COMPREHENSIVE METABOLIC PANEL
ALK PHOS: 75 U/L (ref 38–126)
ALT: 16 U/L (ref 14–54)
ANION GAP: 8 (ref 5–15)
AST: 23 U/L (ref 15–41)
Albumin: 3.9 g/dL (ref 3.5–5.0)
BILIRUBIN TOTAL: 0.5 mg/dL (ref 0.3–1.2)
BUN: 20 mg/dL (ref 6–20)
CALCIUM: 9.4 mg/dL (ref 8.9–10.3)
CO2: 29 mmol/L (ref 22–32)
Chloride: 100 mmol/L — ABNORMAL LOW (ref 101–111)
Creatinine, Ser: 1.51 mg/dL — ABNORMAL HIGH (ref 0.44–1.00)
GFR, EST AFRICAN AMERICAN: 39 mL/min — AB (ref >60)
GFR, EST NON AFRICAN AMERICAN: 34 mL/min — AB (ref >60)
Glucose, Bld: 99 mg/dL (ref 65–99)
Potassium: 4.1 mmol/L (ref 3.5–5.1)
Sodium: 137 mmol/L (ref 135–145)
TOTAL PROTEIN: 7.3 g/dL (ref 6.5–8.1)

## 2017-05-07 LAB — CBC WITH DIFFERENTIAL/PLATELET
BASOS ABS: 0.1 10*3/uL (ref 0–0.1)
BASOS PCT: 2 %
Eosinophils Absolute: 0 10*3/uL (ref 0–0.7)
Eosinophils Relative: 1 %
HEMATOCRIT: 31.9 % — AB (ref 35.0–47.0)
HEMOGLOBIN: 11.5 g/dL — AB (ref 12.0–16.0)
Lymphocytes Relative: 10 %
Lymphs Abs: 0.4 10*3/uL — ABNORMAL LOW (ref 1.0–3.6)
MCH: 34.9 pg — ABNORMAL HIGH (ref 26.0–34.0)
MCHC: 36 g/dL (ref 32.0–36.0)
MCV: 96.9 fL (ref 80.0–100.0)
Monocytes Absolute: 0.2 10*3/uL (ref 0.2–0.9)
Monocytes Relative: 4 %
NEUTROS ABS: 3.6 10*3/uL (ref 1.4–6.5)
NEUTROS PCT: 83 %
Platelets: 304 10*3/uL (ref 150–440)
RBC: 3.29 MIL/uL — ABNORMAL LOW (ref 3.80–5.20)
RDW: 19.5 % — AB (ref 11.5–14.5)
WBC: 4.3 10*3/uL (ref 3.6–11.0)

## 2017-05-07 MED ORDER — PROCHLORPERAZINE MALEATE 10 MG PO TABS: 10.0000 mg | ORAL_TABLET | Freq: Four times a day (QID) | ORAL | 2 refills | Status: DC | PRN

## 2017-05-07 NOTE — Progress Notes (Signed)
Patient is here today for follow up, she does mention at the end of her previous 21 day cycle of her Ibrance she did break out in a rash. She used some OTC cream and it resolved. She has not noticed this again since resuming the Ibrance. She also mentions that she can eat but nothing has a taste.

## 2017-05-08 LAB — CANCER ANTIGEN 27.29: CA 27.29: 82.6 U/mL — ABNORMAL HIGH (ref 0.0–38.6)

## 2017-05-21 ENCOUNTER — Encounter: Payer: Self-pay | Admitting: Pharmacist

## 2017-05-21 NOTE — Progress Notes (Signed)
Oral Chemotherapy Pharmacist Encounter  Received Ibrance refill request from [employer] Patient Assistance. Faxed back request after it was signed by Dr. Grayland Ormond.  Fax#: [phone] Phone#: [phone]  Thank you,  Darl Pikes, PharmD, BCPS Hematology/Oncology Clinical Pharmacist [facility] [phone]  05/21/2017 4:03 PM

## 2017-06-02 NOTE — Progress Notes (Signed)
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  INTERVAL HISTORY: Patient returns to clinic today for repeat laboratory work and further evaluation. She is tolerating Ibrance and letrozole well only with some mild fatigue. She otherwise feels well and is asymptomatic. She has no neurologic complaints. She denies any chest pain or shortness of breath. She has a fair appetite and denies weight loss. She denies vomiting or constipation.  She has no melena or hematochezia.  She has no urinary complaints.  Patient offers no further specific complaints today.  REVIEW OF SYSTEMS:   Review of Systems  Constitutional: Positive for malaise/fatigue. Negative for fever and weight loss.  Respiratory: Negative.  Negative for cough and shortness of breath.   Cardiovascular: Negative.  Negative for chest pain and leg swelling.  Gastrointestinal: Negative for abdominal pain, blood in stool and melena.  Genitourinary: Negative.   Musculoskeletal: Negative for back pain and joint pain.  Skin: Negative.  Negative for rash.  Neurological: Negative for sensory change and weakness.  Psychiatric/Behavioral: Negative.  The patient is not nervous/anxious.     As per HPI. Otherwise, a complete review of systems is negative.  PAST MEDICAL HISTORY: Past Medical History:  Diagnosis Date  . Anxiety   . Arthritis    "back" (04/03/2016)  . Breast cancer ([place]) 2012    no lumpectomy. Treating with meds- Right  . Cancer ([place])    small intestines 2012, gallbladder 2016  . Chronic lower back pain   . COPD (chronic obstructive pulmonary  disease) ([place])    "use inhalers for COPD that dx'd at one time" (04/03/2016)  . Family history of adverse reaction to anesthesia    daughter gets nauseated  . GERD (gastroesophageal reflux disease)   . High cholesterol   . Hypertension   . Hypothyroidism   . Skin cancer    "right neck; mid forehead"    PAST SURGICAL HISTORY: Past Surgical History:  Procedure Laterality Date  . ABDOMINAL HERNIA REPAIR     "after they took tumor out of my colon I developed a hernia"  . ABDOMINAL HYSTERECTOMY    . BACK SURGERY    . BREAST BIOPSY Right 2012   positive  . BREAST BIOPSY Right 2014   positive  . BREAST BIOPSY Right 06/26/2016   US guided - waiting for pathology  . BREAST EXCISIONAL BIOPSY Right 1995   benign  . CARPAL TUNNEL RELEASE Left   . DILATION AND CURETTAGE OF UTERUS     S/P miscarriage  . IR GENERIC HISTORICAL  04/04/2016   IR LUMBAR DISC ASPIRATION W/IMG GUIDE 04/04/2016 [name], MD [facility]  . JOINT REPLACEMENT    . LAPAROSCOPIC CHOLECYSTECTOMY  2016  . SKIN CANCER EXCISION     "right neck; mid forehead"  . TOTAL HIP ARTHROPLASTY Right 10/03/2015   Procedure: TOTAL HIP ARTHROPLASTY ANTERIOR APPROACH;  Surgeon: Hessie Knows, MD;  Location: [facility] ORS;  Service: Orthopedics;  Laterality: Right;  . TOTAL HIP ARTHROPLASTY Left 07/25/2016   Procedure: TOTAL HIP ARTHROPLASTY ANTERIOR APPROACH;  Surgeon: Hessie Knows, MD;  Location: [facility] ORS;  Service: Orthopedics;  Laterality: Left;  . TUBAL LIGATION    . TUMOR  EXCISION  2012   "small intestine; came from the breast cancer"    FAMILY HISTORY: Reviewed and unchanged. No reported history of breast cancer or other chronic diseases.     ADVANCED DIRECTIVES:    HEALTH MAINTENANCE: Social History  Substance Use Topics  . Smoking status: Current Every Day Smoker    Packs/day: 1.00    Years: 54.00    Types: Cigarettes  . Smokeless tobacco: Never Used     Comment: not ready to quit  . Alcohol use No      Colonoscopy:  PAP:  Bone density:  Lipid panel:  Allergies  Allergen Reactions  . Carafate [Sucralfate] Nausea Only  . Diclofenac Sodium Nausea Only  . Erythromycin Diarrhea and Nausea And Vomiting  . Adhesive [Tape] Itching and Rash  . Wellbutrin [Bupropion] Itching and Rash    Current Outpatient Prescriptions  Medication Sig Dispense Refill  . acetaminophen (TYLENOL) 500 MG tablet Take 1,000 mg by mouth daily as needed for mild pain or fever.     . cetirizine (ZYRTEC) 10 MG tablet Take 10 mg by mouth daily as needed for allergies. Reported on 10/03/2015    . citalopram (CELEXA) 20 MG tablet Take 20 mg by mouth every morning.     . dicyclomine (BENTYL) 10 MG capsule Take 10 mg by mouth 3 (three) times daily as needed for spasms.    Marland Kitchen FLUTICASONE PROPIONATE EX Apply topically.    . Fluticasone-Salmeterol (ADVAIR) 100-50 MCG/DOSE AEPB Inhale 1 puff into the lungs every morning.     Marland Kitchen letrozole (FEMARA) 2.5 MG tablet TAKE 1 TABLET EVERY DAY 90 tablet 4  . levothyroxine (SYNTHROID, LEVOTHROID) 100 MCG tablet Take 100 mcg by mouth daily before breakfast.     . montelukast (SINGULAIR) 10 MG tablet Take 10 mg by mouth at bedtime.     . Multiple Vitamins-Minerals (CENTRUM SILVER PO) Take 1 tablet by mouth every morning.    Marland Kitchen omeprazole (PRILOSEC) 20 MG capsule Take 20 mg by mouth every morning.     . palbociclib (IBRANCE) 125 MG capsule Take one tablet daily for 21 days with 7 days off. Take whole with food. 21 capsule 2  . polyethylene glycol (MIRALAX / GLYCOLAX) packet Take 17 g by mouth daily. 14 each 0  . pravastatin (PRAVACHOL) 40 MG tablet Take 40 mg by mouth at bedtime.     . prochlorperazine (COMPAZINE) 10 MG tablet Take 1 tablet (10 mg total) by mouth every 6 (six) hours as needed for nausea or vomiting. 30 tablet 2  . valsartan-hydrochlorothiazide (DIOVAN-HCT) 80-12.5 MG tablet Take 1 tablet by mouth daily.    Marland Kitchen zolpidem (AMBIEN) 10 MG tablet Take 1 tablet (10 mg total) by  mouth at bedtime as needed. for sleep 30 tablet 2   No current facility-administered medications for this visit.     OBJECTIVE: Vitals:   06/04/17 1203  BP: (!) 143/74  Pulse: 60  Resp: 18  Temp: 98 F (36.7 C)     Body mass index is 25.08 kg/m.    ECOG FS:1 - Symptomatic but completely ambulatory  General: Well-developed, well-nourished, no acute distress. Eyes: Pink conjunctiva, anicteric sclera. Breasts: Exam deferred today. Lungs: Clear to auscultation bilaterally. Heart: Regular rate and rhythm. No rubs, murmurs, or gallops. Abdomen: Soft, nontender, nondistended. No organomegaly noted, normoactive bowel sounds. Musculoskeletal: No edema, cyanosis, or clubbing.   Neuro: Alert, answering all questions appropriately. Cranial nerves grossly intact. Skin: No rashes or petechiae noted. Psych: Normal affect.  LAB RESULTS:  Lab Results  Component Value Date   NA 138 06/04/2017   K 4.6 06/04/2017   CL 102 06/04/2017   CO2 27 06/04/2017   GLUCOSE 107 (H) 06/04/2017   BUN 25 (H) 06/04/2017   CREATININE 1.25 (H) 06/04/2017   CALCIUM 9.5 06/04/2017   PROT 7.4 06/04/2017   ALBUMIN 4.2 06/04/2017   AST 24 06/04/2017   ALT 16 06/04/2017   ALKPHOS 77 06/04/2017   BILITOT 0.5 06/04/2017   GFRNONAA 42 (L) 06/04/2017   GFRAA 49 (L) 06/04/2017    Lab Results  Component Value Date   WBC 3.5 (L) 06/04/2017   NEUTROABS 2.6 06/04/2017   HGB 11.1 (L) 06/04/2017   HCT 32.0 (L) 06/04/2017   MCV 103.5 (H) 06/04/2017   PLT 303 06/04/2017    STUDIES: No results found.  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  PLAN:    1.  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 Recent biopsy of right breast consistent with  patient's known primary. PET scan results reviewed independently with no evidence of hypermetabolic soft tissue metastasis. Continue Ibrance 125 mg for 21 days with 7 days off along with daily letrozole. Return to clinic in 4 weeks with repeat laboratory work andthen in 8 weeks with repeat laboratory work and further evaluation.  2.  Hip/back pain: Significantly improved. Patient is status post bilateral hip replacement. 3.  Anemia: Mild, monitor. 4.  Renal insufficiency: Mild, monitor.  Approximately 30 minutes was spent in discussion of which grade and 50% was consultation.  Patient expressed understanding and was in agreement with this plan. She also understands that She can call clinic at any time with any questions, concerns, or complaints.    Lloyd Huger, MD   06/04/2017 3:44 PM

## 2017-06-03 ENCOUNTER — Other Ambulatory Visit: Payer: Self-pay | Admitting: *Deleted

## 2017-06-03 DIAGNOSIS — C50919 Malignant neoplasm of unspecified site of unspecified female breast: Secondary | ICD-10-CM

## 2017-06-04 ENCOUNTER — Inpatient Hospital Stay (HOSPITAL_BASED_OUTPATIENT_CLINIC_OR_DEPARTMENT_OTHER): Payer: Medicare Other | Admitting: Oncology

## 2017-06-04 ENCOUNTER — Other Ambulatory Visit: Payer: Self-pay

## 2017-06-04 ENCOUNTER — Inpatient Hospital Stay: Payer: Medicare Other | Attending: Oncology

## 2017-06-04 VITALS — BP 143/74 | HR 60 | Temp 98.0°F | Resp 18 | Wt 146.1 lb

## 2017-06-04 DIAGNOSIS — C7889 Secondary malignant neoplasm of other digestive organs: Secondary | ICD-10-CM | POA: Diagnosis not present

## 2017-06-04 DIAGNOSIS — I10 Essential (primary) hypertension: Secondary | ICD-10-CM

## 2017-06-04 DIAGNOSIS — M545 Low back pain: Secondary | ICD-10-CM | POA: Insufficient documentation

## 2017-06-04 DIAGNOSIS — F419 Anxiety disorder, unspecified: Secondary | ICD-10-CM

## 2017-06-04 DIAGNOSIS — Z79899 Other long term (current) drug therapy: Secondary | ICD-10-CM

## 2017-06-04 DIAGNOSIS — N2889 Other specified disorders of kidney and ureter: Secondary | ICD-10-CM

## 2017-06-04 DIAGNOSIS — C784 Secondary malignant neoplasm of small intestine: Secondary | ICD-10-CM | POA: Insufficient documentation

## 2017-06-04 DIAGNOSIS — F17210 Nicotine dependence, cigarettes, uncomplicated: Secondary | ICD-10-CM | POA: Diagnosis not present

## 2017-06-04 DIAGNOSIS — K219 Gastro-esophageal reflux disease without esophagitis: Secondary | ICD-10-CM | POA: Insufficient documentation

## 2017-06-04 DIAGNOSIS — Z170 Estrogen receptor positive status [ER+]: Secondary | ICD-10-CM

## 2017-06-04 DIAGNOSIS — C50411 Malignant neoplasm of upper-outer quadrant of right female breast: Secondary | ICD-10-CM

## 2017-06-04 DIAGNOSIS — E7800 Pure hypercholesterolemia, unspecified: Secondary | ICD-10-CM

## 2017-06-04 DIAGNOSIS — R531 Weakness: Secondary | ICD-10-CM | POA: Insufficient documentation

## 2017-06-04 DIAGNOSIS — R5383 Other fatigue: Secondary | ICD-10-CM

## 2017-06-04 DIAGNOSIS — Z79811 Long term (current) use of aromatase inhibitors: Secondary | ICD-10-CM | POA: Insufficient documentation

## 2017-06-04 DIAGNOSIS — D649 Anemia, unspecified: Secondary | ICD-10-CM

## 2017-06-04 DIAGNOSIS — J449 Chronic obstructive pulmonary disease, unspecified: Secondary | ICD-10-CM | POA: Insufficient documentation

## 2017-06-04 DIAGNOSIS — Z85828 Personal history of other malignant neoplasm of skin: Secondary | ICD-10-CM | POA: Insufficient documentation

## 2017-06-04 DIAGNOSIS — G8929 Other chronic pain: Secondary | ICD-10-CM

## 2017-06-04 DIAGNOSIS — C7951 Secondary malignant neoplasm of bone: Secondary | ICD-10-CM

## 2017-06-04 DIAGNOSIS — C50911 Malignant neoplasm of unspecified site of right female breast: Secondary | ICD-10-CM

## 2017-06-04 DIAGNOSIS — E039 Hypothyroidism, unspecified: Secondary | ICD-10-CM | POA: Diagnosis not present

## 2017-06-04 DIAGNOSIS — C50919 Malignant neoplasm of unspecified site of unspecified female breast: Secondary | ICD-10-CM

## 2017-06-04 LAB — COMPREHENSIVE METABOLIC PANEL
ALBUMIN: 4.2 g/dL (ref 3.5–5.0)
ALT: 16 U/L (ref 14–54)
AST: 24 U/L (ref 15–41)
Alkaline Phosphatase: 77 U/L (ref 38–126)
Anion gap: 9 (ref 5–15)
BUN: 25 mg/dL — AB (ref 6–20)
CHLORIDE: 102 mmol/L (ref 101–111)
CO2: 27 mmol/L (ref 22–32)
CREATININE: 1.25 mg/dL — AB (ref 0.44–1.00)
Calcium: 9.5 mg/dL (ref 8.9–10.3)
GFR calc Af Amer: 49 mL/min — ABNORMAL LOW (ref >60)
GFR, EST NON AFRICAN AMERICAN: 42 mL/min — AB (ref >60)
GLUCOSE: 107 mg/dL — AB (ref 65–99)
Potassium: 4.6 mmol/L (ref 3.5–5.1)
Sodium: 138 mmol/L (ref 135–145)
Total Bilirubin: 0.5 mg/dL (ref 0.3–1.2)
Total Protein: 7.4 g/dL (ref 6.5–8.1)

## 2017-06-04 LAB — CBC WITH DIFFERENTIAL/PLATELET
BASOS ABS: 0.1 10*3/uL (ref 0–0.1)
BASOS PCT: 3 %
EOS PCT: 1 %
Eosinophils Absolute: 0 10*3/uL (ref 0–0.7)
HCT: 32 % — ABNORMAL LOW (ref 35.0–47.0)
Hemoglobin: 11.1 g/dL — ABNORMAL LOW (ref 12.0–16.0)
LYMPHS PCT: 18 %
Lymphs Abs: 0.6 10*3/uL — ABNORMAL LOW (ref 1.0–3.6)
MCH: 35.7 pg — ABNORMAL HIGH (ref 26.0–34.0)
MCHC: 34.5 g/dL (ref 32.0–36.0)
MCV: 103.5 fL — AB (ref 80.0–100.0)
Monocytes Absolute: 0.1 10*3/uL — ABNORMAL LOW (ref 0.2–0.9)
Monocytes Relative: 4 %
NEUTROS ABS: 2.6 10*3/uL (ref 1.4–6.5)
Neutrophils Relative %: 74 %
PLATELETS: 303 10*3/uL (ref 150–440)
RBC: 3.09 MIL/uL — AB (ref 3.80–5.20)
RDW: 18.9 % — ABNORMAL HIGH (ref 11.5–14.5)
WBC: 3.5 10*3/uL — AB (ref 3.6–11.0)

## 2017-06-04 NOTE — Progress Notes (Signed)
Pt in for follow up.  Denies any difficulties and has no concerns at the time.

## 2017-06-05 LAB — CANCER ANTIGEN 27.29: CA 27.29: 71.9 U/mL — ABNORMAL HIGH (ref 0.0–38.6)

## 2017-07-02 ENCOUNTER — Inpatient Hospital Stay: Payer: Medicare Other | Attending: Oncology

## 2017-07-02 ENCOUNTER — Telehealth: Payer: Self-pay | Admitting: Oncology

## 2017-07-02 DIAGNOSIS — Z170 Estrogen receptor positive status [ER+]: Secondary | ICD-10-CM | POA: Insufficient documentation

## 2017-07-02 DIAGNOSIS — C50911 Malignant neoplasm of unspecified site of right female breast: Secondary | ICD-10-CM

## 2017-07-02 DIAGNOSIS — C50411 Malignant neoplasm of upper-outer quadrant of right female breast: Secondary | ICD-10-CM | POA: Diagnosis present

## 2017-07-02 LAB — COMPREHENSIVE METABOLIC PANEL
ALBUMIN: 4.3 g/dL (ref 3.5–5.0)
ALK PHOS: 71 U/L (ref 38–126)
ALT: 14 U/L (ref 14–54)
ANION GAP: 6 (ref 5–15)
AST: 21 U/L (ref 15–41)
BUN: 25 mg/dL — ABNORMAL HIGH (ref 6–20)
CALCIUM: 9.4 mg/dL (ref 8.9–10.3)
CO2: 29 mmol/L (ref 22–32)
Chloride: 102 mmol/L (ref 101–111)
Creatinine, Ser: 1.44 mg/dL — ABNORMAL HIGH (ref 0.44–1.00)
GFR calc non Af Amer: 36 mL/min — ABNORMAL LOW (ref >60)
GFR, EST AFRICAN AMERICAN: 41 mL/min — AB (ref >60)
Glucose, Bld: 94 mg/dL (ref 65–99)
POTASSIUM: 4.2 mmol/L (ref 3.5–5.1)
SODIUM: 137 mmol/L (ref 135–145)
TOTAL PROTEIN: 7.2 g/dL (ref 6.5–8.1)
Total Bilirubin: 0.6 mg/dL (ref 0.3–1.2)

## 2017-07-02 LAB — CBC WITH DIFFERENTIAL/PLATELET
BASOS ABS: 0 10*3/uL (ref 0–0.1)
BASOS PCT: 1 %
EOS ABS: 0 10*3/uL (ref 0–0.7)
EOS PCT: 1 %
HEMATOCRIT: 32.8 % — AB (ref 35.0–47.0)
Hemoglobin: 11.6 g/dL — ABNORMAL LOW (ref 12.0–16.0)
Lymphocytes Relative: 18 %
Lymphs Abs: 0.6 10*3/uL — ABNORMAL LOW (ref 1.0–3.6)
MCH: 37.8 pg — ABNORMAL HIGH (ref 26.0–34.0)
MCHC: 35.3 g/dL (ref 32.0–36.0)
MCV: 107 fL — ABNORMAL HIGH (ref 80.0–100.0)
MONO ABS: 0.2 10*3/uL (ref 0.2–0.9)
MONOS PCT: 6 %
NEUTROS ABS: 2.3 10*3/uL (ref 1.4–6.5)
Neutrophils Relative %: 74 %
PLATELETS: 225 10*3/uL (ref 150–440)
RBC: 3.06 MIL/uL — ABNORMAL LOW (ref 3.80–5.20)
RDW: 15.4 % — AB (ref 11.5–14.5)
WBC: 3.1 10*3/uL — ABNORMAL LOW (ref 3.6–11.0)

## 2017-07-02 NOTE — Telephone Encounter (Addendum)
Oral Oncology Patient Advocate Encounter  Did re-enrollment for [employer] for [place]. Faxed to [phone]  Patient coming to sign application [id number].  [place] Patient Advocate [phone] 07/02/2017 12:14 PM

## 2017-07-03 LAB — CANCER ANTIGEN 27.29: CA 27.29: 66 U/mL — ABNORMAL HIGH (ref 0.0–38.6)

## 2017-07-17 ENCOUNTER — Other Ambulatory Visit: Payer: Self-pay | Admitting: Oncology

## 2017-07-17 DIAGNOSIS — C801 Malignant (primary) neoplasm, unspecified: Secondary | ICD-10-CM

## 2017-07-17 DIAGNOSIS — C50911 Malignant neoplasm of unspecified site of right female breast: Secondary | ICD-10-CM

## 2017-08-04 NOTE — Progress Notes (Signed)
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  INTERVAL HISTORY: Patient returns to clinic today for repeat laboratory work and further evaluation. She continues to tolerate Ibrance and letrozole well only with some mild fatigue. She otherwise feels well and is asymptomatic. She has no neurologic complaints. She denies any chest pain or shortness of breath. She has a fair appetite and denies weight loss. She denies vomiting or constipation.  She has no melena or hematochezia.  She has no urinary complaints.  Patient offers no further specific complaints today.  REVIEW OF SYSTEMS:   Review of Systems  Constitutional: Positive for malaise/fatigue. Negative for fever and weight loss.  Respiratory: Negative.  Negative for cough and shortness of breath.   Cardiovascular: Negative.  Negative for chest pain and leg swelling.  Gastrointestinal: Negative for abdominal pain, blood in stool and melena.  Genitourinary: Negative.   Musculoskeletal: Negative for back pain and joint pain.  Skin: Negative.  Negative for rash.  Neurological: Negative for sensory change and weakness.  Psychiatric/Behavioral: Negative.  The patient is not nervous/anxious.     As per HPI. Otherwise, a complete review of systems is negative.  PAST MEDICAL HISTORY: Past Medical History:  Diagnosis Date  . Anxiety   . Arthritis    "back" (04/03/2016)  . Breast cancer ([place]) 2012    no lumpectomy. Treating with meds- Right  . Cancer ([place])    small intestines 2012, gallbladder 2016  . Chronic lower back pain   . COPD (chronic obstructive  pulmonary disease) ([place])    "use inhalers for COPD that dx'd at one time" (04/03/2016)  . Family history of adverse reaction to anesthesia    daughter gets nauseated  . GERD (gastroesophageal reflux disease)   . High cholesterol   . Hypertension   . Hypothyroidism   . Skin cancer    "right neck; mid forehead"    PAST SURGICAL HISTORY: Past Surgical History:  Procedure Laterality Date  . ABDOMINAL HERNIA REPAIR     "after they took tumor out of my colon I developed a hernia"  . ABDOMINAL HYSTERECTOMY    . BACK SURGERY    . BREAST BIOPSY Right 2012   positive  . BREAST BIOPSY Right 2014   positive  . BREAST BIOPSY Right 06/26/2016   US guided - waiting for pathology  . BREAST EXCISIONAL BIOPSY Right 1995   benign  . CARPAL TUNNEL RELEASE Left   . DILATION AND CURETTAGE OF UTERUS     S/P miscarriage  . IR GENERIC HISTORICAL  04/04/2016   IR LUMBAR DISC ASPIRATION W/IMG GUIDE 04/04/2016 [name], MD [facility]  . JOINT REPLACEMENT    . LAPAROSCOPIC CHOLECYSTECTOMY  2016  . SKIN CANCER EXCISION     "right neck; mid forehead"  . TOTAL HIP ARTHROPLASTY Right 10/03/2015   Procedure: TOTAL HIP ARTHROPLASTY ANTERIOR APPROACH;  Surgeon: Hessie Knows, MD;  Location: [facility] ORS;  Service: Orthopedics;  Laterality: Right;  . TOTAL HIP ARTHROPLASTY Left 07/25/2016   Procedure: TOTAL HIP ARTHROPLASTY ANTERIOR APPROACH;  Surgeon: Hessie Knows, MD;  Location: [facility] ORS;  Service: Orthopedics;  Laterality: Left;  . TUBAL LIGATION    .  TUMOR EXCISION  2012   "small intestine; came from the breast cancer"    FAMILY HISTORY: Reviewed and unchanged. No reported history of breast cancer or other chronic diseases.     ADVANCED DIRECTIVES:    HEALTH MAINTENANCE: Social History   Tobacco Use  . Smoking status: Current Every Day Smoker    Packs/day: 1.00    Years: 54.00    Pack years: 54.00    Types: Cigarettes  . Smokeless tobacco: Never Used  . Tobacco comment: not ready to  quit  Substance Use Topics  . Alcohol use: No  . Drug use: No     Colonoscopy:  PAP:  Bone density:  Lipid panel:  Allergies  Allergen Reactions  . Carafate [Sucralfate] Nausea Only  . Diclofenac Sodium Nausea Only  . Erythromycin Diarrhea and Nausea And Vomiting  . Adhesive [Tape] Itching and Rash  . Wellbutrin [Bupropion] Itching and Rash    Current Outpatient Medications  Medication Sig Dispense Refill  . acetaminophen (TYLENOL) 500 MG tablet Take 1,000 mg by mouth daily as needed for mild pain or fever.     . cetirizine (ZYRTEC) 10 MG tablet Take 10 mg by mouth daily as needed for allergies. Reported on 10/03/2015    . citalopram (CELEXA) 20 MG tablet Take 20 mg by mouth every morning.     . dicyclomine (BENTYL) 10 MG capsule Take 10 mg by mouth 3 (three) times daily as needed for spasms.    Marland Kitchen FLUTICASONE PROPIONATE EX Apply topically.    . Fluticasone-Salmeterol (ADVAIR) 100-50 MCG/DOSE AEPB Inhale 1 puff into the lungs every morning.     Marland Kitchen letrozole (FEMARA) 2.5 MG tablet TAKE 1 TABLET EVERY DAY 90 tablet 4  . levothyroxine (SYNTHROID, LEVOTHROID) 100 MCG tablet Take 100 mcg by mouth daily before breakfast.     . montelukast (SINGULAIR) 10 MG tablet Take 10 mg by mouth at bedtime.     . Multiple Vitamins-Minerals (CENTRUM SILVER PO) Take 1 tablet by mouth every morning.    Marland Kitchen omeprazole (PRILOSEC) 20 MG capsule Take 20 mg by mouth every morning.     . palbociclib (IBRANCE) 125 MG capsule Take one tablet daily for 21 days with 7 days off. Take whole with food. 21 capsule 2  . polyethylene glycol (MIRALAX / GLYCOLAX) packet Take 17 g by mouth daily. 14 each 0  . pravastatin (PRAVACHOL) 40 MG tablet Take 40 mg by mouth at bedtime.     . prochlorperazine (COMPAZINE) 10 MG tablet Take 1 tablet (10 mg total) by mouth every 6 (six) hours as needed for nausea or vomiting. 30 tablet 2  . valsartan-hydrochlorothiazide (DIOVAN-HCT) 80-12.5 MG tablet Take 1 tablet by mouth daily.    Marland Kitchen  zolpidem (AMBIEN) 10 MG tablet TAKE 1 TABLET BY MOUTH AT BEDTIME AS NEEDED SLEEP 30 tablet 2   No current facility-administered medications for this visit.     OBJECTIVE: Vitals:   08/06/17 1207  BP: (!) 155/81  Pulse: 61  Resp: 18  Temp: 97.7 F (36.5 C)     Body mass index is 25.44 kg/m.    ECOG FS:1 - Symptomatic but completely ambulatory  General: Well-developed, well-nourished, no acute distress. Eyes: Pink conjunctiva, anicteric sclera. Breasts: Exam deferred today. Lungs: Clear to auscultation bilaterally. Heart: Regular rate and rhythm. No rubs, murmurs, or gallops. Abdomen: Soft, nontender, nondistended. No organomegaly noted, normoactive bowel sounds. Musculoskeletal: No edema, cyanosis, or clubbing.   Neuro: Alert, answering all questions appropriately. Cranial nerves grossly  intact. Skin: No rashes or petechiae noted. Psych: Normal affect.   LAB RESULTS:  Lab Results  Component Value Date   NA 136 08/06/2017   K 4.4 08/06/2017   CL 101 08/06/2017   CO2 27 08/06/2017   GLUCOSE 95 08/06/2017   BUN 33 (H) 08/06/2017   CREATININE 1.33 (H) 08/06/2017   CALCIUM 9.0 08/06/2017   PROT 7.1 08/06/2017   ALBUMIN 4.1 08/06/2017   AST 22 08/06/2017   ALT 15 08/06/2017   ALKPHOS 75 08/06/2017   BILITOT 0.5 08/06/2017   GFRNONAA 39 (L) 08/06/2017   GFRAA 45 (L) 08/06/2017    Lab Results  Component Value Date   WBC 2.9 (L) 08/06/2017   NEUTROABS 2.2 08/06/2017   HGB 12.0 08/06/2017   HCT 34.8 (L) 08/06/2017   MCV 107.8 (H) 08/06/2017   PLT 224 08/06/2017    STUDIES: No results found.  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  PLAN:    1.  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 Recent  biopsy of right breast consistent with patient's known primary. PET scan results reviewed independently with no evidence of hypermetabolic soft tissue metastasis. Continue Ibrance 125 mg for 21 days with 7 days off along with daily letrozole. Return to clinic in 6 weeks with repeat laboratory work and then in 3 months with repeat laboratory work and further evaluation.  2.  Hip/back pain: Significantly improved. Patient is status post bilateral hip replacement. 3.  Anemia: Resolved. 4.  Renal insufficiency: Mild, monitor. 5.  Leukopenia: Likely secondary to Ibrance, monitor.  Approximately 30 minutes was spent in discussion of which grade and 50% was consultation.  Patient expressed understanding and was in agreement with this plan. She also understands that She can call clinic at any time with any questions, concerns, or complaints.    Lloyd Huger, MD   08/08/2017 11:07 AM

## 2017-08-06 ENCOUNTER — Inpatient Hospital Stay (HOSPITAL_BASED_OUTPATIENT_CLINIC_OR_DEPARTMENT_OTHER): Payer: Medicare Other | Admitting: Oncology

## 2017-08-06 ENCOUNTER — Inpatient Hospital Stay: Payer: Medicare Other | Attending: Oncology

## 2017-08-06 VITALS — BP 155/81 | HR 61 | Temp 97.7°F | Resp 18 | Wt 148.2 lb

## 2017-08-06 DIAGNOSIS — N289 Disorder of kidney and ureter, unspecified: Secondary | ICD-10-CM

## 2017-08-06 DIAGNOSIS — K219 Gastro-esophageal reflux disease without esophagitis: Secondary | ICD-10-CM | POA: Diagnosis not present

## 2017-08-06 DIAGNOSIS — Z170 Estrogen receptor positive status [ER+]: Secondary | ICD-10-CM | POA: Insufficient documentation

## 2017-08-06 DIAGNOSIS — M129 Arthropathy, unspecified: Secondary | ICD-10-CM

## 2017-08-06 DIAGNOSIS — Z79811 Long term (current) use of aromatase inhibitors: Secondary | ICD-10-CM | POA: Insufficient documentation

## 2017-08-06 DIAGNOSIS — E7800 Pure hypercholesterolemia, unspecified: Secondary | ICD-10-CM | POA: Insufficient documentation

## 2017-08-06 DIAGNOSIS — M545 Low back pain: Secondary | ICD-10-CM

## 2017-08-06 DIAGNOSIS — Z79899 Other long term (current) drug therapy: Secondary | ICD-10-CM | POA: Insufficient documentation

## 2017-08-06 DIAGNOSIS — Z85828 Personal history of other malignant neoplasm of skin: Secondary | ICD-10-CM | POA: Insufficient documentation

## 2017-08-06 DIAGNOSIS — C7889 Secondary malignant neoplasm of other digestive organs: Secondary | ICD-10-CM | POA: Insufficient documentation

## 2017-08-06 DIAGNOSIS — E039 Hypothyroidism, unspecified: Secondary | ICD-10-CM | POA: Insufficient documentation

## 2017-08-06 DIAGNOSIS — D72819 Decreased white blood cell count, unspecified: Secondary | ICD-10-CM | POA: Insufficient documentation

## 2017-08-06 DIAGNOSIS — R531 Weakness: Secondary | ICD-10-CM

## 2017-08-06 DIAGNOSIS — I10 Essential (primary) hypertension: Secondary | ICD-10-CM

## 2017-08-06 DIAGNOSIS — C50411 Malignant neoplasm of upper-outer quadrant of right female breast: Secondary | ICD-10-CM | POA: Insufficient documentation

## 2017-08-06 DIAGNOSIS — F419 Anxiety disorder, unspecified: Secondary | ICD-10-CM

## 2017-08-06 DIAGNOSIS — R5383 Other fatigue: Secondary | ICD-10-CM | POA: Diagnosis not present

## 2017-08-06 DIAGNOSIS — C7951 Secondary malignant neoplasm of bone: Secondary | ICD-10-CM | POA: Diagnosis not present

## 2017-08-06 DIAGNOSIS — J449 Chronic obstructive pulmonary disease, unspecified: Secondary | ICD-10-CM

## 2017-08-06 DIAGNOSIS — F17210 Nicotine dependence, cigarettes, uncomplicated: Secondary | ICD-10-CM

## 2017-08-06 DIAGNOSIS — C784 Secondary malignant neoplasm of small intestine: Secondary | ICD-10-CM | POA: Insufficient documentation

## 2017-08-06 DIAGNOSIS — C50911 Malignant neoplasm of unspecified site of right female breast: Secondary | ICD-10-CM

## 2017-08-06 LAB — CBC WITH DIFFERENTIAL/PLATELET
Basophils Absolute: 0 10*3/uL (ref 0–0.1)
Basophils Relative: 1 %
EOS ABS: 0 10*3/uL (ref 0–0.7)
EOS PCT: 1 %
HCT: 34.8 % — ABNORMAL LOW (ref 35.0–47.0)
HEMOGLOBIN: 12 g/dL (ref 12.0–16.0)
LYMPHS ABS: 0.6 10*3/uL — AB (ref 1.0–3.6)
Lymphocytes Relative: 20 %
MCH: 37.3 pg — AB (ref 26.0–34.0)
MCHC: 34.6 g/dL (ref 32.0–36.0)
MCV: 107.8 fL — ABNORMAL HIGH (ref 80.0–100.0)
MONOS PCT: 4 %
Monocytes Absolute: 0.1 10*3/uL — ABNORMAL LOW (ref 0.2–0.9)
NEUTROS PCT: 74 %
Neutro Abs: 2.2 10*3/uL (ref 1.4–6.5)
Platelets: 224 10*3/uL (ref 150–440)
RBC: 3.23 MIL/uL — ABNORMAL LOW (ref 3.80–5.20)
RDW: 14.3 % (ref 11.5–14.5)
WBC: 2.9 10*3/uL — ABNORMAL LOW (ref 3.6–11.0)

## 2017-08-06 LAB — COMPREHENSIVE METABOLIC PANEL
ALBUMIN: 4.1 g/dL (ref 3.5–5.0)
ALT: 15 U/L (ref 14–54)
AST: 22 U/L (ref 15–41)
Alkaline Phosphatase: 75 U/L (ref 38–126)
Anion gap: 8 (ref 5–15)
BUN: 33 mg/dL — AB (ref 6–20)
CHLORIDE: 101 mmol/L (ref 101–111)
CO2: 27 mmol/L (ref 22–32)
CREATININE: 1.33 mg/dL — AB (ref 0.44–1.00)
Calcium: 9 mg/dL (ref 8.9–10.3)
GFR calc Af Amer: 45 mL/min — ABNORMAL LOW (ref >60)
GFR calc non Af Amer: 39 mL/min — ABNORMAL LOW (ref >60)
Glucose, Bld: 95 mg/dL (ref 65–99)
Potassium: 4.4 mmol/L (ref 3.5–5.1)
SODIUM: 136 mmol/L (ref 135–145)
Total Bilirubin: 0.5 mg/dL (ref 0.3–1.2)
Total Protein: 7.1 g/dL (ref 6.5–8.1)

## 2017-08-07 LAB — CANCER ANTIGEN 27.29: CA 27.29: 52.8 U/mL — ABNORMAL HIGH (ref 0.0–38.6)

## 2017-08-22 ENCOUNTER — Telehealth: Payer: Self-pay | Admitting: Pharmacist

## 2017-08-22 DIAGNOSIS — C50911 Malignant neoplasm of unspecified site of right female breast: Secondary | ICD-10-CM

## 2017-08-22 MED ORDER — PALBOCICLIB 125 MG PO CAPS: ORAL_CAPSULE | ORAL | 2 refills | Status: DC

## 2017-08-22 NOTE — Telephone Encounter (Signed)
Oral Chemotherapy Pharmacist Encounter  Foundation assistance opened up to cover the copay of Ms. Schnitker's Ibrance. A prescription for her Leslee Home will now be sent to [place].  Darl Pikes, PharmD, BCPS Hematology/Oncology Clinical Pharmacist [facility] [phone]  08/22/2017 3:28 PM

## 2017-08-25 ENCOUNTER — Telehealth: Payer: Self-pay | Admitting: Oncology

## 2017-08-25 MED FILL — IBRANCE 125 MG CAPSULE: 125 | 28 days supply | Qty: 21 | Fill #0

## 2017-08-25 NOTE — Telephone Encounter (Signed)
Oral Oncology Patient Advocate Encounter   E-mailed [facility] to please mail out her Leslee Home.   [place] Patient Advocate [phone] 08/25/2017 9:54 AM

## 2017-08-25 NOTE — Telephone Encounter (Signed)
Oral Oncology Patient Advocate Encounter  Was successful in securing patient a yearly grant from [employer] to provide copayment coverage for her Ibrance.  This will keep the out of pocket expense at $0.    I have spoken with the patient.    The billing information is as follows and has been shared with [place].   Member ID: [id number] Group ID: [id number] RxBin: [id number] Dates of Eligibility: 08/22/2017 through 08/22/2018   [place] Patient Advocate [phone] 08/25/2017 9:35 AM

## 2017-09-12 ENCOUNTER — Telehealth: Payer: Self-pay | Admitting: Oncology

## 2017-09-12 NOTE — Telephone Encounter (Signed)
Oral Oncology Patient Advocate Encounter  Received notification from [place] Patient Assistance program that patient has been successfully enrolled into their program to receive Ibrance from the manufacturer at $0 out of pocket until 08/18/2018.   I called and spoke with patient.  She knows we will have to re-apply.   Patient knows to call the office with questions or concerns.  Oral Oncology Clinic will continue to follow.  [place] Patient Advocate [phone] 09/12/2017 10:56 AM    Called and spoke with Janey Greaser. And asked her to please put this on hold. We got her grants at the beginning of the year. She said she would and to call when we need. [phone]

## 2017-09-17 ENCOUNTER — Inpatient Hospital Stay: Payer: Medicare Other

## 2017-09-19 ENCOUNTER — Inpatient Hospital Stay: Payer: Medicare Other | Attending: Oncology

## 2017-09-19 DIAGNOSIS — C50911 Malignant neoplasm of unspecified site of right female breast: Secondary | ICD-10-CM

## 2017-09-19 DIAGNOSIS — C7951 Secondary malignant neoplasm of bone: Secondary | ICD-10-CM | POA: Diagnosis not present

## 2017-09-19 DIAGNOSIS — C50411 Malignant neoplasm of upper-outer quadrant of right female breast: Secondary | ICD-10-CM | POA: Insufficient documentation

## 2017-09-19 LAB — CBC WITH DIFFERENTIAL/PLATELET
BASOS ABS: 0 10*3/uL (ref 0–0.1)
Basophils Relative: 1 %
EOS ABS: 0 10*3/uL (ref 0–0.7)
EOS PCT: 1 %
HEMATOCRIT: 34.4 % — AB (ref 35.0–47.0)
HEMOGLOBIN: 11.8 g/dL — AB (ref 12.0–16.0)
Lymphocytes Relative: 21 %
Lymphs Abs: 0.6 10*3/uL — ABNORMAL LOW (ref 1.0–3.6)
MCH: 37.1 pg — AB (ref 26.0–34.0)
MCHC: 34.3 g/dL (ref 32.0–36.0)
MCV: 108.2 fL — AB (ref 80.0–100.0)
Monocytes Absolute: 0.3 10*3/uL (ref 0.2–0.9)
Monocytes Relative: 11 %
NEUTROS PCT: 66 %
Neutro Abs: 2 10*3/uL (ref 1.4–6.5)
Platelets: 120 10*3/uL — ABNORMAL LOW (ref 150–440)
RBC: 3.18 MIL/uL — AB (ref 3.80–5.20)
RDW: 14.6 % — AB (ref 11.5–14.5)
WBC: 3 10*3/uL — AB (ref 3.6–11.0)

## 2017-09-19 LAB — COMPREHENSIVE METABOLIC PANEL
ALK PHOS: 64 U/L (ref 38–126)
ALT: 14 U/L (ref 14–54)
ANION GAP: 7 (ref 5–15)
AST: 25 U/L (ref 15–41)
Albumin: 4.3 g/dL (ref 3.5–5.0)
BILIRUBIN TOTAL: 0.5 mg/dL (ref 0.3–1.2)
BUN: 25 mg/dL — ABNORMAL HIGH (ref 6–20)
CALCIUM: 9.2 mg/dL (ref 8.9–10.3)
CO2: 30 mmol/L (ref 22–32)
Chloride: 101 mmol/L (ref 101–111)
Creatinine, Ser: 1.23 mg/dL — ABNORMAL HIGH (ref 0.44–1.00)
GFR, EST AFRICAN AMERICAN: 50 mL/min — AB (ref >60)
GFR, EST NON AFRICAN AMERICAN: 43 mL/min — AB (ref >60)
Glucose, Bld: 97 mg/dL (ref 65–99)
POTASSIUM: 4.1 mmol/L (ref 3.5–5.1)
Sodium: 138 mmol/L (ref 135–145)
Total Protein: 7.1 g/dL (ref 6.5–8.1)

## 2017-09-20 LAB — CANCER ANTIGEN 27.29: CAN 27.29: 46.4 U/mL — AB (ref 0.0–38.6)

## 2017-10-09 MED FILL — IBRANCE 125 MG CAPSULE: 125 | 28 days supply | Qty: 21 | Fill #1

## 2017-10-14 ENCOUNTER — Other Ambulatory Visit: Payer: Self-pay | Admitting: Oncology

## 2017-10-14 DIAGNOSIS — C801 Malignant (primary) neoplasm, unspecified: Secondary | ICD-10-CM

## 2017-10-14 DIAGNOSIS — C50911 Malignant neoplasm of unspecified site of right female breast: Secondary | ICD-10-CM

## 2017-10-15 ENCOUNTER — Other Ambulatory Visit: Payer: Self-pay | Admitting: *Deleted

## 2017-10-15 NOTE — Telephone Encounter (Signed)
This has already been filled.

## 2017-11-02 NOTE — Progress Notes (Signed)
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  INTERVAL HISTORY: Patient returns to clinic today for repeat laboratory work and further evaluation. She continues to tolerate Ibrance and letrozole well only with some persistent fatigue. She has had a nonproductive cough for 2-3 weeks, but otherwise feels well and is asymptomatic.  She denies any fevers.  She has no neurologic complaints. She denies any chest pain or shortness of breath. She has a fair appetite and denies weight loss. She denies nausea, vomiting, or constipation.  She admits to occasional diarrhea. She has no melena or hematochezia.  She has no urinary complaints.  Patient offers no further specific complaints today.  REVIEW OF SYSTEMS:   Review of Systems  Constitutional: Positive for malaise/fatigue. Negative for fever and weight loss.  Respiratory: Positive for cough. Negative for shortness of breath.   Cardiovascular: Negative.  Negative for chest pain and leg swelling.  Gastrointestinal: Positive for diarrhea. Negative for abdominal pain, blood in stool and melena.  Genitourinary: Negative.   Musculoskeletal: Negative for back pain and joint pain.  Skin: Negative.  Negative for rash.  Neurological: Negative.  Negative for sensory change and weakness.  Psychiatric/Behavioral: Negative.  The patient is not nervous/anxious.     As per HPI. Otherwise, a complete review of systems is negative.  PAST MEDICAL HISTORY: Past Medical History:  Diagnosis Date  . Anxiety   . Arthritis    "back" (04/03/2016)  . Breast cancer ([place]) 2012    no  lumpectomy. Treating with meds- Right  . Cancer ([place])    small intestines 2012, gallbladder 2016  . Chronic lower back pain   . COPD (chronic obstructive pulmonary disease) ([place])    "use inhalers for COPD that dx'd at one time" (04/03/2016)  . Family history of adverse reaction to anesthesia    daughter gets nauseated  . GERD (gastroesophageal reflux disease)   . High cholesterol   . Hypertension   . Hypothyroidism   . Skin cancer    "right neck; mid forehead"    PAST SURGICAL HISTORY: Past Surgical History:  Procedure Laterality Date  . ABDOMINAL HERNIA REPAIR     "after they took tumor out of my colon I developed a hernia"  . ABDOMINAL HYSTERECTOMY    . BACK SURGERY    . BREAST BIOPSY Right 2012   positive  . BREAST BIOPSY Right 2014   positive  . BREAST BIOPSY Right 06/26/2016   US guided - waiting for pathology  . BREAST EXCISIONAL BIOPSY Right 1995   benign  . CARPAL TUNNEL RELEASE Left   . DILATION AND CURETTAGE OF UTERUS     S/P miscarriage  . IR GENERIC HISTORICAL  04/04/2016   IR LUMBAR DISC ASPIRATION W/IMG GUIDE 04/04/2016 [name], MD [facility]  . JOINT REPLACEMENT    . LAPAROSCOPIC CHOLECYSTECTOMY  2016  . SKIN CANCER EXCISION     "right neck; mid forehead"  . TOTAL HIP ARTHROPLASTY Right 10/03/2015   Procedure: TOTAL HIP ARTHROPLASTY ANTERIOR APPROACH;  Surgeon: Hessie Knows, MD;  Location: [facility] ORS;  Service: Orthopedics;  Laterality: Right;  . TOTAL HIP ARTHROPLASTY Left 07/25/2016   Procedure: TOTAL HIP ARTHROPLASTY  ANTERIOR APPROACH;  Surgeon: Hessie Knows, MD;  Location: [facility] ORS;  Service: Orthopedics;  Laterality: Left;  . TUBAL LIGATION    . TUMOR EXCISION  2012   "small intestine; came from the breast cancer"    FAMILY HISTORY: Reviewed and unchanged. No reported history of breast cancer or other chronic diseases.     ADVANCED DIRECTIVES:    HEALTH MAINTENANCE: Social History   Tobacco Use  . Smoking status: Current Every Day  Smoker    Packs/day: 1.00    Years: 54.00    Pack years: 54.00    Types: Cigarettes  . Smokeless tobacco: Never Used  . Tobacco comment: not ready to quit  Substance Use Topics  . Alcohol use: No  . Drug use: No     Colonoscopy:  PAP:  Bone density:  Lipid panel:  Allergies  Allergen Reactions  . Carafate [Sucralfate] Nausea Only  . Diclofenac Sodium Nausea Only  . Erythromycin Diarrhea and Nausea And Vomiting  . Adhesive [Tape] Itching and Rash  . Wellbutrin [Bupropion] Itching and Rash    Current Outpatient Medications  Medication Sig Dispense Refill  . acetaminophen (TYLENOL) 500 MG tablet Take 1,000 mg by mouth daily as needed for mild pain or fever.     . cetirizine (ZYRTEC) 10 MG tablet Take 10 mg by mouth daily as needed for allergies. Reported on 10/03/2015    . citalopram (CELEXA) 20 MG tablet Take 20 mg by mouth every morning.     . dicyclomine (BENTYL) 10 MG capsule Take 10 mg by mouth 3 (three) times daily as needed for spasms.    [place] Kitchen FLUTICASONE PROPIONATE EX Apply topically.    . Fluticasone-Salmeterol (ADVAIR) 100-50 MCG/DOSE AEPB Inhale 1 puff into the lungs every morning.     [place] Kitchen letrozole (FEMARA) 2.5 MG tablet TAKE 1 TABLET EVERY DAY 90 tablet 4  . levothyroxine (SYNTHROID, LEVOTHROID) 100 MCG tablet Take 100 mcg by mouth daily before breakfast.     . montelukast (SINGULAIR) 10 MG tablet Take 10 mg by mouth at bedtime.     . Multiple Vitamins-Minerals (CENTRUM SILVER PO) Take 1 tablet by mouth every morning.    [place] Kitchen omeprazole (PRILOSEC) 20 MG capsule Take 20 mg by mouth every morning.     . palbociclib (IBRANCE) 125 MG capsule Take one tablet daily for 21 days with 7 days off. Take whole with food. 21 capsule 2  . polyethylene glycol (MIRALAX / GLYCOLAX) packet Take 17 g by mouth daily. 14 each 0  . pravastatin (PRAVACHOL) 40 MG tablet Take 40 mg by mouth at bedtime.     . valsartan-hydrochlorothiazide (DIOVAN-HCT) 80-12.5 MG tablet Take 1 tablet by mouth daily.     [place] Kitchen zolpidem (AMBIEN) 10 MG tablet TAKE 1 TABLET BY MOUTH AT BEDTIME AS NEEDED FOR SLEEP 30 tablet 2  . prochlorperazine (COMPAZINE) 10 MG tablet Take 1 tablet (10 mg total) by mouth every 6 (six) hours as needed for nausea or vomiting. (Patient not taking: Reported on 11/04/2017) 30 tablet 2   No current facility-administered medications for this visit.     OBJECTIVE: Vitals:   11/04/17 1457  BP: 135/79  Pulse: 65  Resp: 18  Temp: 97.9 F (36.6 C)     Body mass index is 24.73 kg/m.    ECOG FS:1 - Symptomatic but completely ambulatory  General: Well-developed, well-nourished, no acute distress. Eyes: Pink conjunctiva, anicteric sclera. Breasts: Exam deferred today. Lungs: Clear to auscultation bilaterally. Heart: Regular rate and rhythm. No  rubs, murmurs, or gallops. Abdomen: Soft, nontender, nondistended. No organomegaly noted, normoactive bowel sounds. Musculoskeletal: No edema, cyanosis, or clubbing.   Neuro: Alert, answering all questions appropriately. Cranial nerves grossly intact. Skin: No rashes or petechiae noted. Psych: Normal affect.   LAB RESULTS:  Lab Results  Component Value Date   NA 137 11/04/2017   K 3.9 11/04/2017   CL 102 11/04/2017   CO2 26 11/04/2017   GLUCOSE 132 (H) 11/04/2017   BUN 24 (H) 11/04/2017   CREATININE 1.35 (H) 11/04/2017   CALCIUM 9.4 11/04/2017   PROT 7.3 11/04/2017   ALBUMIN 3.9 11/04/2017   AST 22 11/04/2017   ALT 14 11/04/2017   ALKPHOS 82 11/04/2017   BILITOT 0.5 11/04/2017   GFRNONAA 38 (L) 11/04/2017   GFRAA 45 (L) 11/04/2017    Lab Results  Component Value Date   WBC 2.6 (L) 11/04/2017   NEUTROABS 1.8 11/04/2017   HGB 10.9 (L) 11/04/2017   HCT 30.7 (L) 11/04/2017   MCV 107.4 (H) 11/04/2017   PLT 177 11/04/2017    STUDIES: No results found.  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  PLAN:    1.  Stage IV lobular upper  outer right breast cancer with metastatic lesions in small bowel, gall bladder, and T7 vertebrae: CA 27-29 was slowly trending up, but since initiating treatment and has now trended down to 46.4.  Today's result is pending.  Recent biopsy of right breast consistent with patient's known primary. PET scan results reviewed independently with no evidence of hypermetabolic soft tissue metastasis. Continue Ibrance 125 mg for 21 days with 7 days off along with daily letrozole. Return to clinic in 6 weeks with repeat laboratory work and then in 3 months with repeat laboratory work and further evaluation.  2.  Hip/back pain: Significantly improved. Patient is status post bilateral hip replacement. 3.  Anemia: Mild, monitor. 4.  Renal insufficiency: Patient's creatinine appears to be approximately her baseline.  Monitor.   5.  Leukopenia: Likely secondary to Ibrance, monitor. 6.  Cough: Recommended over-the-counter symptomatic treatment. 7.  Diarrhea: Mild.  Imodium as needed.  Approximately 30 minutes was spent in discussion of which grade and 50% was consultation.  Patient expressed understanding and was in agreement with this plan. She also understands that She can call clinic at any time with any questions, concerns, or complaints.    Lloyd Huger, MD   11/04/2017 3:21 PM

## 2017-11-04 ENCOUNTER — Inpatient Hospital Stay: Payer: Medicare Other | Attending: Oncology | Admitting: Oncology

## 2017-11-04 ENCOUNTER — Encounter: Payer: Self-pay | Admitting: Oncology

## 2017-11-04 ENCOUNTER — Inpatient Hospital Stay: Payer: Medicare Other

## 2017-11-04 VITALS — BP 135/79 | HR 65 | Temp 97.9°F | Resp 18 | Wt 144.1 lb

## 2017-11-04 DIAGNOSIS — Z170 Estrogen receptor positive status [ER+]: Secondary | ICD-10-CM | POA: Diagnosis not present

## 2017-11-04 DIAGNOSIS — D72819 Decreased white blood cell count, unspecified: Secondary | ICD-10-CM | POA: Diagnosis not present

## 2017-11-04 DIAGNOSIS — N289 Disorder of kidney and ureter, unspecified: Secondary | ICD-10-CM | POA: Insufficient documentation

## 2017-11-04 DIAGNOSIS — C50411 Malignant neoplasm of upper-outer quadrant of right female breast: Secondary | ICD-10-CM | POA: Insufficient documentation

## 2017-11-04 DIAGNOSIS — D649 Anemia, unspecified: Secondary | ICD-10-CM | POA: Diagnosis not present

## 2017-11-04 DIAGNOSIS — Z720 Tobacco use: Secondary | ICD-10-CM | POA: Diagnosis not present

## 2017-11-04 DIAGNOSIS — C50911 Malignant neoplasm of unspecified site of right female breast: Secondary | ICD-10-CM

## 2017-11-04 DIAGNOSIS — R05 Cough: Secondary | ICD-10-CM | POA: Insufficient documentation

## 2017-11-04 DIAGNOSIS — R197 Diarrhea, unspecified: Secondary | ICD-10-CM | POA: Insufficient documentation

## 2017-11-04 LAB — CBC WITH DIFFERENTIAL/PLATELET
BASOS ABS: 0.1 10*3/uL (ref 0–0.1)
Basophils Relative: 2 %
Eosinophils Absolute: 0 10*3/uL (ref 0–0.7)
Eosinophils Relative: 1 %
HCT: 30.7 % — ABNORMAL LOW (ref 35.0–47.0)
HEMOGLOBIN: 10.9 g/dL — AB (ref 12.0–16.0)
LYMPHS PCT: 21 %
Lymphs Abs: 0.5 10*3/uL — ABNORMAL LOW (ref 1.0–3.6)
MCH: 38.1 pg — ABNORMAL HIGH (ref 26.0–34.0)
MCHC: 35.5 g/dL (ref 32.0–36.0)
MCV: 107.4 fL — AB (ref 80.0–100.0)
Monocytes Absolute: 0.1 10*3/uL — ABNORMAL LOW (ref 0.2–0.9)
Monocytes Relative: 5 %
NEUTROS ABS: 1.8 10*3/uL (ref 1.4–6.5)
NEUTROS PCT: 71 %
PLATELETS: 177 10*3/uL (ref 150–440)
RBC: 2.86 MIL/uL — AB (ref 3.80–5.20)
RDW: 15.1 % — ABNORMAL HIGH (ref 11.5–14.5)
WBC: 2.6 10*3/uL — ABNORMAL LOW (ref 3.6–11.0)

## 2017-11-04 LAB — COMPREHENSIVE METABOLIC PANEL
ALT: 14 U/L (ref 14–54)
ANION GAP: 9 (ref 5–15)
AST: 22 U/L (ref 15–41)
Albumin: 3.9 g/dL (ref 3.5–5.0)
Alkaline Phosphatase: 82 U/L (ref 38–126)
BILIRUBIN TOTAL: 0.5 mg/dL (ref 0.3–1.2)
BUN: 24 mg/dL — ABNORMAL HIGH (ref 6–20)
CO2: 26 mmol/L (ref 22–32)
Calcium: 9.4 mg/dL (ref 8.9–10.3)
Chloride: 102 mmol/L (ref 101–111)
Creatinine, Ser: 1.35 mg/dL — ABNORMAL HIGH (ref 0.44–1.00)
GFR calc Af Amer: 45 mL/min — ABNORMAL LOW (ref >60)
GFR calc non Af Amer: 38 mL/min — ABNORMAL LOW (ref >60)
Glucose, Bld: 132 mg/dL — ABNORMAL HIGH (ref 65–99)
POTASSIUM: 3.9 mmol/L (ref 3.5–5.1)
Sodium: 137 mmol/L (ref 135–145)
TOTAL PROTEIN: 7.3 g/dL (ref 6.5–8.1)

## 2017-11-04 NOTE — Progress Notes (Signed)
Pt in for follow up.  Doing well with exception of cough that has lasted 3 weeks.  Pt expressed concern.

## 2017-11-05 LAB — CANCER ANTIGEN 27.29: CAN 27.29: 41 U/mL — AB (ref 0.0–38.6)

## 2017-11-12 ENCOUNTER — Telehealth: Payer: Self-pay | Admitting: Pharmacist

## 2017-11-12 MED FILL — IBRANCE 125 MG CAPSULE: 125 | 28 days supply | Qty: 21 | Fill #2

## 2017-11-12 NOTE — Telephone Encounter (Signed)
Oral Chemotherapy Pharmacist Encounter  Follow-Up Form  Called patient today to follow up regarding patient's oral chemotherapy medication: Ibrance (palbociclib)  Original Start date of oral chemotherapy: 12/2016  Pt reports 0 tablets/doses of Ibrance (palbociclib) missed in the last cycle.   Pt reports the following side effects: none, reported  Recent labs reviewed: CA 27.29 from 11/04/17  New medications?: none, reported  Other Issues: none, reported  Patient knows to call the office with questions or concerns. Oral Oncology Clinic will continue to follow.  Darl Pikes, PharmD, BCPS Hematology/Oncology Clinical Pharmacist [facility] [place] [facility] [phone]  11/12/2017 2:17 PM

## 2017-11-12 NOTE — Telephone Encounter (Signed)
Oral Chemotherapy Pharmacist Encounter   Attempted to reach patient for follow up on oral medication: Ibrance (palbociclib). Line busy, will call back.  Darl Pikes, PharmD, BCPS Hematology/Oncology Clinical Pharmacist [facility] [place] [facility] [phone]  11/12/2017 1:58 PM

## 2017-12-04 ENCOUNTER — Other Ambulatory Visit: Payer: Self-pay | Admitting: Oncology

## 2017-12-04 DIAGNOSIS — C50911 Malignant neoplasm of unspecified site of right female breast: Secondary | ICD-10-CM

## 2017-12-08 MED FILL — IBRANCE 125 MG CAPSULE: 125 | 28 days supply | Qty: 21 | Fill #0

## 2017-12-16 ENCOUNTER — Inpatient Hospital Stay: Payer: Medicare Other | Attending: Oncology

## 2017-12-16 DIAGNOSIS — C50911 Malignant neoplasm of unspecified site of right female breast: Secondary | ICD-10-CM

## 2017-12-16 DIAGNOSIS — C50411 Malignant neoplasm of upper-outer quadrant of right female breast: Secondary | ICD-10-CM | POA: Diagnosis not present

## 2017-12-16 LAB — CBC WITH DIFFERENTIAL/PLATELET
Basophils Absolute: 0.1 10*3/uL (ref 0–0.1)
Basophils Relative: 3 %
Eosinophils Absolute: 0 10*3/uL (ref 0–0.7)
Eosinophils Relative: 1 %
HEMATOCRIT: 31.8 % — AB (ref 35.0–47.0)
Hemoglobin: 11.3 g/dL — ABNORMAL LOW (ref 12.0–16.0)
LYMPHS PCT: 21 %
Lymphs Abs: 0.6 10*3/uL — ABNORMAL LOW (ref 1.0–3.6)
MCH: 38.3 pg — ABNORMAL HIGH (ref 26.0–34.0)
MCHC: 35.5 g/dL (ref 32.0–36.0)
MCV: 107.8 fL — AB (ref 80.0–100.0)
MONO ABS: 0.2 10*3/uL (ref 0.2–0.9)
MONOS PCT: 8 %
NEUTROS ABS: 2 10*3/uL (ref 1.4–6.5)
Neutrophils Relative %: 67 %
PLATELETS: 165 10*3/uL (ref 150–440)
RBC: 2.95 MIL/uL — ABNORMAL LOW (ref 3.80–5.20)
RDW: 14.6 % — ABNORMAL HIGH (ref 11.5–14.5)
WBC: 2.9 10*3/uL — ABNORMAL LOW (ref 3.6–11.0)

## 2017-12-16 LAB — COMPREHENSIVE METABOLIC PANEL
ALBUMIN: 4 g/dL (ref 3.5–5.0)
ALK PHOS: 59 U/L (ref 38–126)
ALT: 15 U/L (ref 14–54)
AST: 24 U/L (ref 15–41)
Anion gap: 9 (ref 5–15)
BUN: 24 mg/dL — AB (ref 6–20)
CALCIUM: 9.2 mg/dL (ref 8.9–10.3)
CHLORIDE: 104 mmol/L (ref 101–111)
CO2: 25 mmol/L (ref 22–32)
CREATININE: 1.41 mg/dL — AB (ref 0.44–1.00)
GFR calc Af Amer: 42 mL/min — ABNORMAL LOW (ref >60)
GFR calc non Af Amer: 37 mL/min — ABNORMAL LOW (ref >60)
GLUCOSE: 92 mg/dL (ref 65–99)
Potassium: 4.1 mmol/L (ref 3.5–5.1)
SODIUM: 138 mmol/L (ref 135–145)
Total Bilirubin: 0.5 mg/dL (ref 0.3–1.2)
Total Protein: 6.8 g/dL (ref 6.5–8.1)

## 2017-12-17 LAB — CANCER ANTIGEN 27.29: CA 27.29: 40.9 U/mL — ABNORMAL HIGH (ref 0.0–38.6)

## 2017-12-29 ENCOUNTER — Other Ambulatory Visit: Payer: Self-pay | Admitting: *Deleted

## 2017-12-29 ENCOUNTER — Other Ambulatory Visit: Payer: Self-pay

## 2017-12-29 ENCOUNTER — Telehealth: Payer: Self-pay | Admitting: *Deleted

## 2017-12-29 DIAGNOSIS — Z853 Personal history of malignant neoplasm of breast: Secondary | ICD-10-CM | POA: Diagnosis not present

## 2017-12-29 DIAGNOSIS — R112 Nausea with vomiting, unspecified: Secondary | ICD-10-CM | POA: Insufficient documentation

## 2017-12-29 DIAGNOSIS — J449 Chronic obstructive pulmonary disease, unspecified: Secondary | ICD-10-CM | POA: Insufficient documentation

## 2017-12-29 DIAGNOSIS — Z85828 Personal history of other malignant neoplasm of skin: Secondary | ICD-10-CM | POA: Insufficient documentation

## 2017-12-29 DIAGNOSIS — E039 Hypothyroidism, unspecified: Secondary | ICD-10-CM | POA: Insufficient documentation

## 2017-12-29 DIAGNOSIS — N390 Urinary tract infection, site not specified: Secondary | ICD-10-CM | POA: Insufficient documentation

## 2017-12-29 DIAGNOSIS — R1032 Left lower quadrant pain: Secondary | ICD-10-CM | POA: Diagnosis not present

## 2017-12-29 DIAGNOSIS — Z96643 Presence of artificial hip joint, bilateral: Secondary | ICD-10-CM | POA: Insufficient documentation

## 2017-12-29 DIAGNOSIS — E876 Hypokalemia: Secondary | ICD-10-CM | POA: Insufficient documentation

## 2017-12-29 DIAGNOSIS — F419 Anxiety disorder, unspecified: Secondary | ICD-10-CM | POA: Insufficient documentation

## 2017-12-29 DIAGNOSIS — Z79899 Other long term (current) drug therapy: Secondary | ICD-10-CM | POA: Diagnosis not present

## 2017-12-29 DIAGNOSIS — F17210 Nicotine dependence, cigarettes, uncomplicated: Secondary | ICD-10-CM | POA: Insufficient documentation

## 2017-12-29 DIAGNOSIS — I10 Essential (primary) hypertension: Secondary | ICD-10-CM | POA: Diagnosis not present

## 2017-12-29 DIAGNOSIS — R197 Diarrhea, unspecified: Secondary | ICD-10-CM | POA: Diagnosis present

## 2017-12-29 LAB — CBC
HEMATOCRIT: 32.8 % — AB (ref 35.0–47.0)
Hemoglobin: 11.7 g/dL — ABNORMAL LOW (ref 12.0–16.0)
MCH: 38.2 pg — AB (ref 26.0–34.0)
MCHC: 35.7 g/dL (ref 32.0–36.0)
MCV: 107 fL — AB (ref 80.0–100.0)
PLATELETS: 168 10*3/uL (ref 150–440)
RBC: 3.06 MIL/uL — ABNORMAL LOW (ref 3.80–5.20)
RDW: 14.9 % — AB (ref 11.5–14.5)
WBC: 3 10*3/uL — AB (ref 3.6–11.0)

## 2017-12-29 LAB — COMPREHENSIVE METABOLIC PANEL
ALT: 24 U/L (ref 14–54)
ANION GAP: 12 (ref 5–15)
AST: 40 U/L (ref 15–41)
Albumin: 4.3 g/dL (ref 3.5–5.0)
Alkaline Phosphatase: 84 U/L (ref 38–126)
BILIRUBIN TOTAL: 0.7 mg/dL (ref 0.3–1.2)
BUN: 24 mg/dL — ABNORMAL HIGH (ref 6–20)
CO2: 23 mmol/L (ref 22–32)
Calcium: 9 mg/dL (ref 8.9–10.3)
Chloride: 97 mmol/L — ABNORMAL LOW (ref 101–111)
Creatinine, Ser: 1.6 mg/dL — ABNORMAL HIGH (ref 0.44–1.00)
GFR, EST AFRICAN AMERICAN: 36 mL/min — AB (ref >60)
GFR, EST NON AFRICAN AMERICAN: 31 mL/min — AB (ref >60)
GLUCOSE: 163 mg/dL — AB (ref 65–99)
POTASSIUM: 3.3 mmol/L — AB (ref 3.5–5.1)
Sodium: 132 mmol/L — ABNORMAL LOW (ref 135–145)
TOTAL PROTEIN: 7.5 g/dL (ref 6.5–8.1)

## 2017-12-29 LAB — TROPONIN I

## 2017-12-29 LAB — LIPASE, BLOOD: LIPASE: 29 U/L (ref 11–51)

## 2017-12-29 NOTE — Telephone Encounter (Signed)
Called patient to relate MD advice. She agreed to take Imodium per package instructions for 24 hours and call in AM if she is not better.

## 2017-12-29 NOTE — Telephone Encounter (Signed)
Patient called for advice regarding diarrhea .She states she has been having bouts of diarrhea for the past five days along with stomach cramping and indigestion.  Please advise.

## 2017-12-29 NOTE — ED Triage Notes (Signed)
Pt with co diarrhea since Thursday, pt is on oral chemo treatments but was told it was not side effect from meds. Pt also vomiting and has mid abd pain. Pt also co dizziness, mid upper back pain that radiates to left arm.

## 2017-12-29 NOTE — Telephone Encounter (Signed)
Unlikely related to her [facility] since she has been on this for several months.  Take over-the-counter Imodium.  Patient can also be seen in [facility] clinic today if she wants.

## 2017-12-30 ENCOUNTER — Emergency Department
Admission: EM | Admit: 2017-12-30 | Discharge: 2017-12-30 | Disposition: A | Discharge status: Home / Self Care | Payer: Medicare Other | Attending: Emergency Medicine | Admitting: Emergency Medicine

## 2017-12-30 ENCOUNTER — Inpatient Hospital Stay: Payer: Medicare Other

## 2017-12-30 ENCOUNTER — Other Ambulatory Visit: Payer: Self-pay | Admitting: *Deleted

## 2017-12-30 ENCOUNTER — Telehealth: Payer: Self-pay | Admitting: *Deleted

## 2017-12-30 ENCOUNTER — Inpatient Hospital Stay (HOSPITAL_BASED_OUTPATIENT_CLINIC_OR_DEPARTMENT_OTHER): Payer: Medicare Other | Admitting: Oncology

## 2017-12-30 ENCOUNTER — Inpatient Hospital Stay: Payer: Medicare Other | Attending: Oncology

## 2017-12-30 ENCOUNTER — Emergency Department: Payer: Medicare Other

## 2017-12-30 DIAGNOSIS — Z720 Tobacco use: Secondary | ICD-10-CM

## 2017-12-30 DIAGNOSIS — R634 Abnormal weight loss: Secondary | ICD-10-CM | POA: Diagnosis not present

## 2017-12-30 DIAGNOSIS — E876 Hypokalemia: Secondary | ICD-10-CM

## 2017-12-30 DIAGNOSIS — C7951 Secondary malignant neoplasm of bone: Secondary | ICD-10-CM | POA: Diagnosis not present

## 2017-12-30 DIAGNOSIS — R112 Nausea with vomiting, unspecified: Secondary | ICD-10-CM | POA: Diagnosis not present

## 2017-12-30 DIAGNOSIS — R197 Diarrhea, unspecified: Secondary | ICD-10-CM

## 2017-12-30 DIAGNOSIS — C50411 Malignant neoplasm of upper-outer quadrant of right female breast: Secondary | ICD-10-CM

## 2017-12-30 DIAGNOSIS — C7989 Secondary malignant neoplasm of other specified sites: Secondary | ICD-10-CM

## 2017-12-30 DIAGNOSIS — C784 Secondary malignant neoplasm of small intestine: Secondary | ICD-10-CM | POA: Insufficient documentation

## 2017-12-30 DIAGNOSIS — N390 Urinary tract infection, site not specified: Secondary | ICD-10-CM | POA: Diagnosis not present

## 2017-12-30 DIAGNOSIS — R1032 Left lower quadrant pain: Secondary | ICD-10-CM

## 2017-12-30 LAB — GASTROINTESTINAL PANEL BY PCR, STOOL (REPLACES STOOL CULTURE)
ADENOVIRUS F40/41: NOT DETECTED
Astrovirus: NOT DETECTED
CRYPTOSPORIDIUM: NOT DETECTED
Campylobacter species: NOT DETECTED
Cyclospora cayetanensis: NOT DETECTED
ENTEROPATHOGENIC E COLI (EPEC): NOT DETECTED
ENTEROTOXIGENIC E COLI (ETEC): NOT DETECTED
Entamoeba histolytica: NOT DETECTED
Enteroaggregative E coli (EAEC): NOT DETECTED
Giardia lamblia: NOT DETECTED
Norovirus GI/GII: NOT DETECTED
Plesimonas shigelloides: NOT DETECTED
ROTAVIRUS A: NOT DETECTED
SAPOVIRUS (I, II, IV, AND V): NOT DETECTED
Salmonella species: NOT DETECTED
Shiga like toxin producing E coli (STEC): NOT DETECTED
Shigella/Enteroinvasive E coli (EIEC): NOT DETECTED
Vibrio cholerae: NOT DETECTED
Vibrio species: NOT DETECTED
YERSINIA ENTEROCOLITICA: NOT DETECTED

## 2017-12-30 LAB — CBC WITH DIFFERENTIAL/PLATELET
BASOS ABS: 0 10*3/uL (ref 0–0.1)
BASOS PCT: 1 %
EOS ABS: 0 10*3/uL (ref 0–0.7)
Eosinophils Relative: 1 %
HEMATOCRIT: 31.4 % — AB (ref 35.0–47.0)
HEMOGLOBIN: 11.2 g/dL — AB (ref 12.0–16.0)
Lymphocytes Relative: 24 %
Lymphs Abs: 0.6 10*3/uL — ABNORMAL LOW (ref 1.0–3.6)
MCH: 38.5 pg — ABNORMAL HIGH (ref 26.0–34.0)
MCHC: 35.7 g/dL (ref 32.0–36.0)
MCV: 107.8 fL — ABNORMAL HIGH (ref 80.0–100.0)
Monocytes Absolute: 0.2 10*3/uL (ref 0.2–0.9)
Monocytes Relative: 7 %
NEUTROS ABS: 1.6 10*3/uL (ref 1.4–6.5)
NEUTROS PCT: 67 %
Platelets: 143 10*3/uL — ABNORMAL LOW (ref 150–440)
RBC: 2.91 MIL/uL — AB (ref 3.80–5.20)
RDW: 14.5 % (ref 11.5–14.5)
WBC: 2.4 10*3/uL — AB (ref 3.6–11.0)

## 2017-12-30 LAB — URINALYSIS, COMPLETE (UACMP) WITH MICROSCOPIC
BILIRUBIN URINE: NEGATIVE
Bacteria, UA: NONE SEEN
GLUCOSE, UA: NEGATIVE mg/dL
HGB URINE DIPSTICK: NEGATIVE
Ketones, ur: NEGATIVE mg/dL
Nitrite: NEGATIVE
PROTEIN: NEGATIVE mg/dL
Specific Gravity, Urine: 1.01 (ref 1.005–1.030)
pH: 5 (ref 5.0–8.0)

## 2017-12-30 LAB — COMPREHENSIVE METABOLIC PANEL
ALBUMIN: 4.1 g/dL (ref 3.5–5.0)
ALT: 22 U/L (ref 14–54)
ANION GAP: 9 (ref 5–15)
AST: 29 U/L (ref 15–41)
Alkaline Phosphatase: 77 U/L (ref 38–126)
BILIRUBIN TOTAL: 0.8 mg/dL (ref 0.3–1.2)
BUN: 18 mg/dL (ref 6–20)
CALCIUM: 8.7 mg/dL — AB (ref 8.9–10.3)
CO2: 24 mmol/L (ref 22–32)
CREATININE: 1.34 mg/dL — AB (ref 0.44–1.00)
Chloride: 104 mmol/L (ref 101–111)
GFR calc non Af Amer: 39 mL/min — ABNORMAL LOW (ref >60)
GFR, EST AFRICAN AMERICAN: 45 mL/min — AB (ref >60)
Glucose, Bld: 106 mg/dL — ABNORMAL HIGH (ref 65–99)
Potassium: 3.3 mmol/L — ABNORMAL LOW (ref 3.5–5.1)
Sodium: 137 mmol/L (ref 135–145)
TOTAL PROTEIN: 7 g/dL (ref 6.5–8.1)

## 2017-12-30 LAB — C DIFFICILE QUICK SCREEN W PCR REFLEX
C DIFFICLE (CDIFF) ANTIGEN: NEGATIVE
C Diff interpretation: NOT DETECTED
C Diff toxin: NEGATIVE

## 2017-12-30 LAB — MAGNESIUM: MAGNESIUM: 1.5 mg/dL — AB (ref 1.7–2.4)

## 2017-12-30 MED ORDER — CIPROFLOXACIN HCL 500 MG PO TABS: 500.0000 mg | ORAL_TABLET | Freq: Two times a day (BID) | ORAL | 0 refills | Status: DC

## 2017-12-30 MED ORDER — SODIUM CHLORIDE 0.9 % IV SOLN: Freq: Once | INTRAVENOUS | Status: DC

## 2017-12-30 MED ORDER — IOPAMIDOL (ISOVUE-300) INJECTION 61%
30.0000 mL | Freq: Once | INTRAVENOUS | Status: AC
  Administered 2017-12-30: 30 mL via ORAL

## 2017-12-30 MED ORDER — POTASSIUM CHLORIDE 10 MEQ/100ML IV SOLN
10.0000 meq | Freq: Once | INTRAVENOUS | Status: AC
Start: 2017-12-30 — End: 2017-12-30
  Administered 2017-12-30: 10 meq via INTRAVENOUS
  Filled 2017-12-30: qty 100

## 2017-12-30 MED ORDER — MAGNESIUM SULFATE 2 GM/50ML IV SOLN
2.0000 g | Freq: Once | INTRAVENOUS | Status: AC
  Administered 2017-12-30: 2 g via INTRAVENOUS

## 2017-12-30 MED ORDER — ONDANSETRON HCL 4 MG/2ML IJ SOLN
4.0000 mg | Freq: Once | INTRAMUSCULAR | Status: AC
  Administered 2017-12-30: 4 mg via INTRAVENOUS
  Filled 2017-12-30: qty 2

## 2017-12-30 MED ORDER — SODIUM CHLORIDE 0.9 % IV SOLN
INTRAVENOUS | Status: DC
  Administered 2017-12-30: 13:00:00 via INTRAVENOUS
  Filled 2017-12-30 (×2): qty 1000

## 2017-12-30 MED ORDER — FAMOTIDINE IN NACL 20-0.9 MG/50ML-% IV SOLN
20.0000 mg | Freq: Once | INTRAVENOUS | Status: AC
  Administered 2017-12-30: 20 mg via INTRAVENOUS
  Filled 2017-12-30: qty 50

## 2017-12-30 MED ORDER — DIPHENOXYLATE-ATROPINE 2.5-0.025 MG PO TABS: 1.0000 | ORAL_TABLET | Freq: Four times a day (QID) | ORAL | 0 refills | Status: DC | PRN

## 2017-12-30 MED ORDER — SODIUM CHLORIDE 0.9 % IV SOLN: 2.0000 g | Freq: Once | INTRAVENOUS | Status: DC

## 2017-12-30 MED ORDER — SODIUM CHLORIDE 0.9 % IV BOLUS
1000.0000 mL | Freq: Once | INTRAVENOUS | Status: AC
  Administered 2017-12-30: 1000 mL via INTRAVENOUS

## 2017-12-30 MED ORDER — ONDANSETRON HCL 4 MG/2ML IJ SOLN
8.0000 mg | Freq: Once | INTRAMUSCULAR | Status: AC
  Administered 2017-12-30: 8 mg via INTRAVENOUS
  Filled 2017-12-30: qty 4

## 2017-12-30 MED ORDER — ONDANSETRON 4 MG PO TBDP: 4.0000 mg | ORAL_TABLET | Freq: Three times a day (TID) | ORAL | 0 refills | Status: DC | PRN

## 2017-12-30 MED ORDER — CIPROFLOXACIN HCL 500 MG PO TABS
500.0000 mg | ORAL_TABLET | Freq: Once | ORAL | Status: AC
  Administered 2017-12-30: 500 mg via ORAL
  Filled 2017-12-30: qty 1

## 2017-12-30 MED ORDER — DEXAMETHASONE SODIUM PHOSPHATE 10 MG/ML IJ SOLN
10.0000 mg | Freq: Once | INTRAMUSCULAR | Status: AC
  Administered 2017-12-30: 10 mg via INTRAVENOUS
  Filled 2017-12-30: qty 1

## 2017-12-30 NOTE — Discharge Instructions (Addendum)
1.  Take antibiotic as prescribed (Cipro 500 mg twice daily for 5 days). 2.  You may take Zofran as needed for nausea. 3.  Clear liquids for 12 hours, then BRAT diet for 3 days, then slowly advance diet as tolerated. 4.  Return to the ER for worsening symptoms, persistent vomiting, difficulty breathing or other concerns.

## 2017-12-30 NOTE — ED Provider Notes (Signed)
[facility] Emergency Department Provider Note   ____________________________________________   First MD Initiated Contact with Patient 12/30/17 0115     (approximate)  I have reviewed the triage vital signs and the nursing notes.   HISTORY  Chief Complaint Diarrhea    HPI Sara David is a 72 y.o. female who presents to the ED from home with a chief complaint of diarrhea.  Patient has a history of breast cancer, on oral chemotherapy, history of hysterectomy, abdominal hernia repair, SBO who presents with diarrhea for 5 days.  Patient reports she has a loose bowel movement after she eats anything.  Took 3 Imodium's today which has finally slowed down the frequency of her bowel movements.  Began vomiting with left lower quadrant abdominal pain today.  Call the cancer center and was reassured her oral chemotherapy agent Leslee Home) should not be causing the symptoms as she has been on it for the past year.  Denies associated fever, chills, chest pain, shortness of breath, dysuria.  Does state she has noted indigestion "all week".  While vomiting tonight patient complained of dizziness and pain across her shoulders.  Denies recent travel or trauma.  Denies recent antibiotic use.   Past Medical History:  Diagnosis Date  . Anxiety   . Arthritis    "back" (04/03/2016)  . Breast cancer ([place]) 2012    no lumpectomy. Treating with meds- Right  . Cancer ([place])    small intestines 2012, gallbladder 2016  . Chronic lower back pain   . COPD (chronic obstructive pulmonary disease) ([place])    "use inhalers for COPD that dx'd at one time" (04/03/2016)  . Family history of adverse reaction to anesthesia    daughter gets nauseated  . GERD (gastroesophageal reflux disease)   . High cholesterol   . Hypertension   . Hypothyroidism   . Skin cancer    "right neck; mid forehead"    Patient Active Problem List   Diagnosis Date Noted  . Primary localized osteoarthritis of left  hip 07/25/2016  . Left hip pain 06/12/2016  . Acute low back pain 04/02/2016  . Asthma without status asthmaticus 10/17/2015  . Malignant neoplasm of upper-outer quadrant of right female breast ([place]) 10/17/2015  . Carpal tunnel syndrome 10/17/2015  . Cervical disc disease 10/17/2015  . Cervical pain 10/17/2015  . Benign essential HTN 10/17/2015  . Acid reflux 10/17/2015  . Combined fat and carbohydrate induced hyperlipemia 10/17/2015  . Adult hypothyroidism 10/17/2015  . Adaptive colitis 10/17/2015  . Primary osteoarthritis of right hip 10/03/2015  . Small bowel obstruction ([place]) 01/30/2015  . B12 deficiency 01/30/2015  . Gastroesophageal reflux disease 01/30/2015  . IBS (irritable bowel syndrome) 01/30/2015  . Asthma 01/30/2015  . Hypothyroidism 01/30/2015  . FH: cholecystectomy 01/30/2015  . Breast cancer metastasized to multiple sites ([place]) 01/30/2015  . Bloodgood disease 05/30/2014  . Post menopausal syndrome 05/30/2014  . Raynaud's syndrome without gangrene 05/30/2014  . Chronic bronchitis ([place]) 01/26/2014    Past Surgical History:  Procedure Laterality Date  . ABDOMINAL HERNIA REPAIR     "after they took tumor out of my colon I developed a hernia"  . ABDOMINAL HYSTERECTOMY    . BACK SURGERY    . BREAST BIOPSY Right 2012   positive  . BREAST BIOPSY Right 2014   positive  . BREAST BIOPSY Right 06/26/2016   US guided - waiting for pathology  . BREAST EXCISIONAL BIOPSY Right 1995   benign  . CARPAL TUNNEL RELEASE  Left   . DILATION AND CURETTAGE OF UTERUS     S/P miscarriage  . IR GENERIC HISTORICAL  04/04/2016   IR LUMBAR DISC ASPIRATION W/IMG GUIDE 04/04/2016 [name], MD [facility]-INTERV RAD  . JOINT REPLACEMENT    . LAPAROSCOPIC CHOLECYSTECTOMY  2016  . SKIN CANCER EXCISION     "right neck; mid forehead"  . TOTAL HIP ARTHROPLASTY Right 10/03/2015   Procedure: TOTAL HIP ARTHROPLASTY ANTERIOR APPROACH;  Surgeon: Hessie Knows, MD;  Location: [facility] ORS;  Service:  Orthopedics;  Laterality: Right;  . TOTAL HIP ARTHROPLASTY Left 07/25/2016   Procedure: TOTAL HIP ARTHROPLASTY ANTERIOR APPROACH;  Surgeon: Hessie Knows, MD;  Location: [facility] ORS;  Service: Orthopedics;  Laterality: Left;  . TUBAL LIGATION    . TUMOR EXCISION  2012   "small intestine; came from the breast cancer"    Prior to Admission medications   Medication Sig Start Date End Date Taking? Authorizing Provider  acetaminophen (TYLENOL) 500 MG tablet Take 1,000 mg by mouth daily as needed for mild pain or fever.     [provider]  cetirizine (ZYRTEC) 10 MG tablet Take 10 mg by mouth daily as needed for allergies. Reported on 10/03/2015    [provider]  ciprofloxacin (CIPRO) 500 MG tablet Take 1 tablet (500 mg total) by mouth 2 (two) times daily. 12/30/17   Paulette Blanch, MD  citalopram (CELEXA) 20 MG tablet Take 20 mg by mouth every morning.     [provider]  dicyclomine (BENTYL) 10 MG capsule Take 10 mg by mouth 3 (three) times daily as needed for spasms.    [provider]  FLUTICASONE PROPIONATE EX Apply topically.    [provider]  Fluticasone-Salmeterol (ADVAIR) 100-50 MCG/DOSE AEPB Inhale 1 puff into the lungs every morning.     [provider]  IBRANCE 125 MG capsule TAKE ONE CAPSULE DAILY FOR 21 DAYS WITH 7 DAYS OFF. TAKE WHOLE WITH FOOD. 12/04/17   Lloyd Huger, MD  letrozole [facility]) 2.5 MG tablet TAKE 1 TABLET EVERY DAY 04/23/17   Lloyd Huger, MD  levothyroxine (SYNTHROID, LEVOTHROID) 100 MCG tablet Take 100 mcg by mouth daily before breakfast.     [provider]  montelukast (SINGULAIR) 10 MG tablet Take 10 mg by mouth at bedtime.     [provider]  Multiple Vitamins-Minerals (CENTRUM SILVER PO) Take 1 tablet by mouth every morning.    [provider]  omeprazole (PRILOSEC) 20 MG capsule Take 20 mg by mouth every morning.     [provider]  ondansetron (ZOFRAN ODT) 4 MG  disintegrating tablet Take 1 tablet (4 mg total) by mouth every 8 (eight) hours as needed for nausea or vomiting. 12/30/17   Paulette Blanch, MD  polyethylene glycol (MIRALAX / Floria Raveling) packet Take 17 g by mouth daily. 04/11/16   Domenic Polite, MD  pravastatin (PRAVACHOL) 40 MG tablet Take 40 mg by mouth at bedtime.     [provider]  prochlorperazine (COMPAZINE) 10 MG tablet Take 1 tablet (10 mg total) by mouth every 6 (six) hours as needed for nausea or vomiting. Patient not taking: Reported on 11/04/2017 05/07/17   Lloyd Huger, MD  valsartan-hydrochlorothiazide (DIOVAN-HCT) 80-12.5 MG tablet Take 1 tablet by mouth daily.    [provider]  zolpidem (AMBIEN) 10 MG tablet TAKE 1 TABLET BY MOUTH AT BEDTIME AS NEEDED FOR SLEEP 10/15/17   Lloyd Huger, MD    Allergies Carafate [sucralfate]; Diclofenac  sodium; Erythromycin; Adhesive [tape]; and Wellbutrin [bupropion]  Family History  Problem Relation Age of Onset  . Lung cancer Brother 31  . Heart disease Father   . Diabetes Father   . Arthritis Mother   . Breast cancer Neg Hx     Social History Social History   Tobacco Use  . Smoking status: Current Every Day Smoker    Packs/day: 1.00    Years: 54.00    Pack years: 54.00    Types: Cigarettes  . Smokeless tobacco: Never Used  . Tobacco comment: not ready to quit  Substance Use Topics  . Alcohol use: No  . Drug use: No    Review of Systems  Constitutional: No fever/chills. Eyes: No visual changes. ENT: No sore throat. Cardiovascular: Denies chest pain. Respiratory: Denies shortness of breath. Gastrointestinal: Positive for abdominal pain, nausea, vomiting and diarrhea.  No constipation. Genitourinary: Negative for dysuria. Musculoskeletal: Negative for back pain. Skin: Negative for rash. Neurological: Negative for headaches, focal weakness or numbness.   ____________________________________________   PHYSICAL EXAM:  VITAL SIGNS: ED  Triage Vitals  Enc Vitals Group     BP 12/29/17 2039 102/83     Pulse Rate 12/29/17 2039 69     Resp 12/29/17 2039 20     Temp 12/29/17 2039 98.8 F (37.1 C)     Temp Source 12/29/17 2039 Oral     SpO2 12/29/17 2039 100 %     Weight 12/29/17 2040 140 lb (63.5 kg)     Height 12/29/17 2040 5\' 4"  (1.626 m)     Head Circumference --      Peak Flow --      Pain Score 12/29/17 2040 5     Pain Loc --      Pain Edu? --      Excl. in [place]? --     Constitutional: Alert and oriented. Well appearing and in no acute distress. Eyes: Conjunctivae are normal. PERRL. EOMI. Head: Atraumatic. Nose: No congestion/rhinnorhea. Mouth/Throat: Mucous membranes are mildly dry. Neck: No stridor.   Cardiovascular: Normal rate, regular rhythm. Grossly normal heart sounds.  Good peripheral circulation. Respiratory: Normal respiratory effort.  No retractions. Lungs CTAB. Gastrointestinal: Soft and mildly tender palpation left lower quadrant without rebound or guarding. No distention. No abdominal bruits. No CVA tenderness. Musculoskeletal: No lower extremity tenderness nor edema.  No joint effusions. Neurologic:  Normal speech and language. No gross focal neurologic deficits are appreciated. No gait instability. Skin:  Skin is warm, dry and intact. No rash noted. Psychiatric: Mood and affect are normal. Speech and behavior are normal.  ____________________________________________   LABS (all labs ordered are listed, but only abnormal results are displayed)  Labs Reviewed  CBC - Abnormal; Notable for the following components:      Result Value   WBC 3.0 (*)    RBC 3.06 (*)    Hemoglobin 11.7 (*)    HCT 32.8 (*)    MCV 107.0 (*)    MCH 38.2 (*)    RDW 14.9 (*)    All other components within normal limits  COMPREHENSIVE METABOLIC PANEL - Abnormal; Notable for the following components:   Sodium 132 (*)    Potassium 3.3 (*)    Chloride 97 (*)    Glucose, Bld 163 (*)    BUN 24 (*)    Creatinine, Ser  1.60 (*)    GFR calc non Af Amer 31 (*)    GFR calc Af Amer 36 (*)  All other components within normal limits  URINALYSIS, COMPLETE (UACMP) WITH MICROSCOPIC - Abnormal; Notable for the following components:   Color, Urine YELLOW (*)    APPearance HAZY (*)    Leukocytes, UA TRACE (*)    All other components within normal limits  C DIFFICILE QUICK SCREEN W PCR REFLEX  GASTROINTESTINAL PANEL BY PCR, STOOL (REPLACES STOOL CULTURE)  LIPASE, BLOOD  TROPONIN I   ____________________________________________  EKG  ED ECG REPORT I, Eryka Dolinger J, the attending physician, personally viewed and interpreted this ECG.   Date: 12/30/2017  EKG Time: 2048  Rate: 67  Rhythm: normal EKG, normal sinus rhythm  Axis: Normal  Intervals:none  ST&T Change: Nonspecific  ____________________________________________  RADIOLOGY  ED MD interpretation: Diverticulosis without diverticulitis  Official radiology report(s): Ct Abdomen Pelvis Wo Contrast  Result Date: 12/30/2017 CLINICAL DATA:  Acute onset of vomiting and mid abdominal pain. Mid upper back pain and dizziness. EXAM: CT ABDOMEN AND PELVIS WITHOUT CONTRAST TECHNIQUE: Multidetector CT imaging of the abdomen and pelvis was performed following the standard protocol without IV contrast. COMPARISON:  PET/CT performed 04/29/2017 FINDINGS: Lower chest: Minimal bibasilar atelectasis or scarring is noted. The visualized portions of the mediastinum are unremarkable. Hepatobiliary: The liver is unremarkable in appearance. The patient is status post cholecystectomy. The common bile duct remains normal in caliber. Pancreas: The pancreas is within normal limits. Spleen: The spleen is unremarkable in appearance. Adrenals/Urinary Tract: The adrenal glands are unremarkable in appearance. The kidneys are within normal limits. There is no evidence of hydronephrosis. No renal or ureteral stones are identified. No perinephric stranding is seen. Stomach/Bowel: The  stomach is unremarkable in appearance. The small bowel is within normal limits. The appendix is normal in caliber, without evidence of appendicitis. There is fatty infiltration within the wall of the ascending and proximal transverse colon, likely reflecting sequelae of chronic inflammation. Scattered diverticulosis is noted along the sigmoid colon, without evidence of diverticulitis. Vascular/Lymphatic: Scattered calcification is seen along the abdominal aorta and its branches. There is slight ectasia of the abdominal aorta to 2.6 cm in AP dimension. The inferior vena cava is grossly unremarkable. No retroperitoneal lymphadenopathy is seen. No pelvic sidewall lymphadenopathy is identified. Reproductive: The bladder is grossly unremarkable. The patient is status post hysterectomy. No suspicious adnexal masses are seen. Other: No additional soft tissue abnormalities are seen. Musculoskeletal: No acute osseous abnormalities are identified. Vacuum phenomenon and endplate sclerotic change are noted along the lumbar spine. The visualized musculature is unremarkable in appearance. IMPRESSION: 1. No acute abnormality seen to explain the patient's symptoms. 2. Scattered diverticulosis along the sigmoid colon, without evidence of diverticulitis. Sequelae of chronic inflammation along the ascending and proximal transverse colon. 3. Ectatic abdominal aorta at risk for aneurysm development. Recommend followup by ultrasound in 5 years. This recommendation follows ACR consensus guidelines: White Paper of the ACR Incidental Findings Committee II on Vascular Findings. J Am Coll Radiol 2013; 10:789-794. Aortic Atherosclerosis (ICD10-I70.0). Electronically Signed   By: Garald Balding M.D.   On: 12/30/2017 04:29    ____________________________________________   PROCEDURES  Procedure(s) performed: None  Procedures  Critical Care performed: No  ____________________________________________   INITIAL IMPRESSION /  ASSESSMENT AND PLAN / ED COURSE  As part of my medical decision making, I reviewed the following data within the electronic MEDICAL RECORD NUMBER History obtained from family, Nursing notes reviewed and incorporated, Labs reviewed, EKG interpreted, Old chart reviewed, Radiograph reviewed and Notes from prior ED visits   71 year old female with breast cancer  on oral chemotherapy agent, history of abdominal hernia surgery with small bowel obstruction, who presents with a 5-day history of diarrhea; now with abdominal pain and vomiting. Differential diagnosis includes, but is not limited to, ovarian cyst, ovarian torsion, acute appendicitis, diverticulitis, urinary tract infection/pyelonephritis, endometriosis, bowel obstruction, colitis, renal colic, gastroenteritis, hernia, fibroids, endometriosis, etc.   Laboratory results demonstrate stable WBC from prior, mild hypokalemia, mild dehydration, trace leukocytes and urinalysis.  Troponin is unremarkable.  Will initiate IV fluid resuscitation, IV Pepcid, IV Zofran, stool cultures.  Will proceed with CT abdomen/pelvis to evaluate for intra-abdominal etiology of patient's symptoms.   Clinical Course as of Dec 31 655  Tue Dec 30, 2017  0430 Patient resting no acute distress.  Updated her of CT results.  Awaiting stool results.   [JS]  [id number] Updated patient of stool results.  Will discharge home with a 5-day course of Cipro for mild UTI.  Prescription for Zofran as needed for nausea.  She will follow-up closely with her PCP or oncologist this week.  Strict return precautions given.  Patient verbalizes understanding and agrees with plan of care.   [JS]    Clinical Course User Index [JS] Paulette Blanch, MD     ____________________________________________   FINAL CLINICAL IMPRESSION(S) / ED DIAGNOSES  Final diagnoses:  Nausea vomiting and diarrhea  Left lower quadrant pain  Hypokalemia  Urinary tract infection without hematuria, site unspecified      ED Discharge Orders        Ordered    ciprofloxacin (CIPRO) 500 MG tablet  2 times daily     12/30/17 0552    ondansetron (ZOFRAN ODT) 4 MG disintegrating tablet  Every 8 hours PRN     12/30/17 0552       Note:  This document was prepared using Dragon voice recognition software and may include unintentional dictation errors.    Paulette Blanch, MD 12/30/17 [phone]

## 2017-12-30 NOTE — Telephone Encounter (Signed)
Patient called stating she continues to have diarrhea.  Asking to see NP today.  Sonia Baller, NP spoke to patient and advised her to come for labs and be seen in [facility] this morning.  Patient in agreement.

## 2018-01-01 ENCOUNTER — Telehealth: Payer: Self-pay | Admitting: *Deleted

## 2018-01-01 NOTE — Telephone Encounter (Signed)
Patient called and reports that she needs to know how long she is to take the Lomotil and that she is still having diarrhea this morning because she didn't eat nor take her medicine for it. Per Lorretta Harp, NP, patient to give it until Monday, Dr Grayland Ormond thinks it is food related because everything else has come back negative. Patient advised of this

## 2018-01-01 NOTE — Progress Notes (Signed)
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 Grayland Ormond on 11/04/2016 for repeat laboratory work and further evaluation.  She continues to tolerate Ibrance (125 mg for 21 days with 7 days off) and letrozole well with only some mild persistent fatigue.  Scheduled to return to clinic in 6 weeks with lab work and 3 months for lab work and MD evaluation.  CA 27-29 and has stabilized.  Most recent CA 27 9 from 12/16/2017 was 40.9 previously 41.0.  It appears patient has been seeing Dr.  Grayland Ormond since 2012.  Have been monitoring her CA 27-29 levels and periodic PET scans.  It was noted she will stay on letrozole indefinitely or until significant progression of disease.  She has a health history significant for breast cancer (2012), total hysterectomy, total left hip replacement surgery (Dec 2017) abdominal hernia repair (2016) and small bowel obstruction.   Evaluated in the emergency room last night for nausea, vomiting and diarrhea.  ED work-up included stool cultures including C. difficile and GI panel, CT abdomen/pelvis (negative for abnormalities), labs including CBC and met C, troponin which did not indicate source or reason for diarrhea.  Urinalysis showed mild UTI.  She was given a prescription for Cipro 5-day course.  Interval history-  Patient complains of diarrhea. Symptoms have been present for approximately 6 days. The symptoms are gradually improving.  Stool frequency is approximately 4 per day. Patient estimates stool volume to be 1/2 to 1 cup per bowel movement. Diarrhea does occur at night. The patient has noted no bleeding associated with bowel movements. The also patient reports the following symptoms: watery diarrhea and weight loss. The patient denies the following symptoms: abdominal distension, fecal incontinence, fever, joint aches and night sweats. The patient currently denies significant abdominal pain or discomfort.   Relationship to food: the diarrhea is made worse by eating foods. Relationship to medications: the patient denies any relationship to medications. Other risk factors: immunocompromise. Therapy tried so far: OTC antidiarrheal: Immodium, results fair. Work up so far: stool cultures negative, C. difficile toxin negative, CBC, result Normal, chemistry studies Normal, liver function studies Normal, sedimentation rate Normal, CT abdomen/Pelvis Negative.   ECOG FS:1 - Symptomatic but completely ambulatory  Review of systems- Review of Systems  Constitutional: Positive for malaise/fatigue and weight loss. Negative for chills and fever.  HENT: Negative for congestion and ear pain.   Eyes: Negative.  Negative for blurred vision and double vision.  Respiratory: Negative.  Negative for cough, sputum production and shortness of breath.   Cardiovascular: Negative.  Negative for chest pain, palpitations and leg swelling.  Gastrointestinal: Positive for diarrhea. Negative for abdominal pain, constipation, nausea and vomiting.  Genitourinary: Negative for dysuria, frequency and urgency.  Musculoskeletal: Negative for back pain and falls.  Skin: Negative.  Negative for rash.  Neurological: Positive for weakness. Negative for headaches.  Endo/Heme/Allergies: Negative.  Does not bruise/bleed easily.  Psychiatric/Behavioral: Negative.  Negative for depression. The patient is not nervous/anxious and does not have insomnia.  Current  treatment- Ibrance and Letrozole  Allergies  Allergen Reactions  . Carafate [Sucralfate] Nausea Only  . Diclofenac Sodium Nausea Only  . Erythromycin Diarrhea and Nausea And Vomiting  . Adhesive [Tape] Itching and Rash  . Wellbutrin [Bupropion] Itching and Rash     Past Medical History:  Diagnosis Date  . Anxiety   . Arthritis    "back" (04/03/2016)  . Breast cancer ([place]) 2012    no lumpectomy. Treating with meds- Right  . Cancer ([place])    small intestines 2012, gallbladder 2016  . Chronic lower back pain   . COPD (chronic obstructive pulmonary disease) ([place])    "use inhalers for COPD that dx'd at one time" (04/03/2016)  . Family history of adverse reaction to anesthesia    daughter gets nauseated  . GERD (gastroesophageal reflux disease)   . High cholesterol   . Hypertension   . Hypothyroidism   . Skin cancer    "right neck; mid forehead"     Past Surgical History:  Procedure Laterality Date  . ABDOMINAL HERNIA REPAIR     "after they took tumor out of my colon I developed a hernia"  . ABDOMINAL HYSTERECTOMY    . BACK SURGERY    . BREAST BIOPSY Right 2012   positive  . BREAST BIOPSY Right 2014   positive  . BREAST BIOPSY Right 06/26/2016   US guided - waiting for pathology  . BREAST EXCISIONAL BIOPSY Right 1995   benign  . CARPAL TUNNEL RELEASE Left   . DILATION AND CURETTAGE OF UTERUS     S/P miscarriage  . IR GENERIC HISTORICAL  04/04/2016   IR LUMBAR DISC ASPIRATION W/IMG GUIDE 04/04/2016 [name], MD [facility]-INTERV RAD  . JOINT REPLACEMENT    . LAPAROSCOPIC CHOLECYSTECTOMY  2016  . SKIN CANCER EXCISION     "right neck; mid forehead"  . TOTAL HIP ARTHROPLASTY Right 10/03/2015   Procedure: TOTAL HIP ARTHROPLASTY ANTERIOR APPROACH;  Surgeon: Hessie Knows, MD;  Location: [facility] ORS;  Service: Orthopedics;  Laterality: Right;  . TOTAL HIP ARTHROPLASTY Left 07/25/2016   Procedure: TOTAL HIP ARTHROPLASTY ANTERIOR APPROACH;  Surgeon: Hessie Knows, MD;  Location:  [facility] ORS;  Service: Orthopedics;  Laterality: Left;  . TUBAL LIGATION    . TUMOR EXCISION  2012   "small intestine; came from the breast cancer"    Social History   Socioeconomic History  . Marital status: Married    Spouse name: Not on file  . Number of children: Not on file  . Years of education: Not on file  . Highest education level: Not on file  Occupational History  . Not on file  Social Needs  . Financial resource strain: Not on file  . Food insecurity:    Worry: Not on file    Inability: Not on file  . Transportation needs:    Medical: Not on file    Non-medical: Not on file  Tobacco Use  . Smoking status: Current Every Day Smoker    Packs/day: 1.00    Years: 54.00    Pack years: 54.00    Types: Cigarettes  . Smokeless tobacco: Never Used  . Tobacco comment: not ready to quit  Substance and Sexual Activity  . Alcohol use: No  . Drug use: No  . Sexual activity: Not Currently  Lifestyle  . Physical activity:    Days per week: Not on file    Minutes per session: Not on file  . Stress: Not on file  Relationships  . Social connections:    Talks on phone: Not on file    Gets together: Not on file    Attends religious service: Not on file    Active member of club or organization: Not on file    Attends meetings of clubs or organizations: Not on file    Relationship status: Not on file  . Intimate partner violence:    Fear of current or ex partner: Not on file    Emotionally abused: Not on file    Physically abused: Not on file    Forced sexual activity: Not on file  Other Topics Concern  . Not on file  Social History Narrative  . Not on file    Family History  Problem Relation Age of Onset  . Lung cancer Brother 61  . Heart disease Father   . Diabetes Father   . Arthritis Mother   . Breast cancer Neg Hx      Current Outpatient Medications:  .  acetaminophen (TYLENOL) 500 MG tablet, Take 1,000 mg by mouth daily as needed for mild pain or fever. ,  Disp: , Rfl:  .  cetirizine (ZYRTEC) 10 MG tablet, Take 10 mg by mouth daily as needed for allergies. Reported on 10/03/2015, Disp: , Rfl:  .  ciprofloxacin (CIPRO) 500 MG tablet, Take 1 tablet (500 mg total) by mouth 2 (two) times daily., Disp: 10 tablet, Rfl: 0 .  citalopram (CELEXA) 20 MG tablet, Take 20 mg by mouth every morning. , Disp: , Rfl:  .  dicyclomine (BENTYL) 10 MG capsule, Take 10 mg by mouth 3 (three) times daily as needed for spasms., Disp: , Rfl:  .  FLUTICASONE PROPIONATE EX, Apply topically., Disp: , Rfl:  .  Fluticasone-Salmeterol (ADVAIR) 100-50 MCG/DOSE AEPB, Inhale 1 puff into the lungs every morning. , Disp: , Rfl:  .  IBRANCE 125 MG capsule, TAKE ONE CAPSULE DAILY FOR 21 DAYS WITH 7 DAYS OFF. TAKE WHOLE WITH FOOD., Disp: 21 capsule, Rfl: 2 .  letrozole (FEMARA) 2.5 MG tablet, TAKE 1 TABLET EVERY DAY, Disp: 90 tablet, Rfl: 4 .  levothyroxine (SYNTHROID, LEVOTHROID) 100 MCG tablet, Take 100 mcg by mouth daily before breakfast. , Disp: , Rfl:  .  montelukast (SINGULAIR) 10 MG tablet, Take 10 mg by mouth at bedtime. , Disp: , Rfl:  .  Multiple Vitamins-Minerals (CENTRUM SILVER PO), Take 1 tablet by mouth every morning., Disp: , Rfl:  .  omeprazole (PRILOSEC) 20 MG capsule, Take 20 mg by mouth every morning. , Disp: , Rfl:  .  ondansetron (ZOFRAN ODT) 4 MG disintegrating tablet, Take 1 tablet (4 mg total) by mouth every 8 (eight) hours as needed for nausea or vomiting., Disp: 20 tablet, Rfl: 0 .  polyethylene glycol (MIRALAX / GLYCOLAX) packet, Take 17 g by mouth daily., Disp: 14 each, Rfl: 0 .  pravastatin (PRAVACHOL) 40 MG tablet, Take 40 mg by mouth at bedtime. , Disp: , Rfl:  .  valsartan-hydrochlorothiazide (DIOVAN-HCT) 80-12.5 MG tablet, Take 1 tablet by mouth daily., Disp: , Rfl:  .  zolpidem (AMBIEN) 10 MG tablet, TAKE 1 TABLET BY MOUTH AT BEDTIME AS NEEDED FOR SLEEP, Disp: 30 tablet, Rfl: 2 .  diphenoxylate-atropine (LOMOTIL) 2.5-0.025 MG tablet, Take 1 tablet by mouth  4 (four) times daily as needed for diarrhea or loose stools., Disp: 30 tablet, Rfl: 0 .  prochlorperazine (COMPAZINE) 10 MG tablet, Take 1 tablet (10 mg total) by mouth every 6 (six) hours as needed for  nausea or vomiting. (Patient not taking: Reported on 11/04/2017), Disp: 30 tablet, Rfl: 2  Physical exam:  Vitals:   12/30/17 1146  BP: 113/62  Pulse: 64  Resp: 18  Temp: 98.8 F (37.1 C)  TempSrc: Tympanic  SpO2: 100%  Weight: 140 lb (63.5 kg)   Physical Exam  Constitutional: She is oriented to person, place, and time. Vital signs are normal.  HENT:  Head: Normocephalic and atraumatic.  Eyes: Pupils are equal, round, and reactive to light.  Neck: Normal range of motion.  Cardiovascular: Normal rate, regular rhythm and normal heart sounds.  No murmur heard. Pulmonary/Chest: Effort normal and breath sounds normal. She has no wheezes.  Abdominal: Soft. Normal appearance and bowel sounds are normal. She exhibits no distension. There is tenderness.  Musculoskeletal: Normal range of motion. She exhibits no edema.  Neurological: She is alert and oriented to person, place, and time.  Skin: Skin is warm and dry. No rash noted.  Psychiatric: Judgment normal.     CMP Latest Ref Rng & Units 12/30/2017  Glucose 65 - 99 mg/dL 106(H)  BUN 6 - 20 mg/dL 18  Creatinine 0.44 - 1.00 mg/dL 1.34(H)  Sodium 135 - 145 mmol/L 137  Potassium 3.5 - 5.1 mmol/L 3.3(L)  Chloride 101 - 111 mmol/L 104  CO2 22 - 32 mmol/L 24  Calcium 8.9 - 10.3 mg/dL 8.7(L)  Total Protein 6.5 - 8.1 g/dL 7.0  Total Bilirubin 0.3 - 1.2 mg/dL 0.8  Alkaline Phos 38 - 126 U/L 77  AST 15 - 41 U/L 29  ALT 14 - 54 U/L 22   CBC Latest Ref Rng & Units 12/30/2017  WBC 3.6 - 11.0 K/uL 2.4(L)  Hemoglobin 12.0 - 16.0 g/dL 11.2(L)  Hematocrit 35.0 - 47.0 % 31.4(L)  Platelets 150 - 440 K/uL 143(L)    No images are attached to the encounter.  Ct Abdomen Pelvis Wo Contrast  Result Date: 12/30/2017 CLINICAL DATA:  Acute onset of  vomiting and mid abdominal pain. Mid upper back pain and dizziness. EXAM: CT ABDOMEN AND PELVIS WITHOUT CONTRAST TECHNIQUE: Multidetector CT imaging of the abdomen and pelvis was performed following the standard protocol without IV contrast. COMPARISON:  PET/CT performed 04/29/2017 FINDINGS: Lower chest: Minimal bibasilar atelectasis or scarring is noted. The visualized portions of the mediastinum are unremarkable. Hepatobiliary: The liver is unremarkable in appearance. The patient is status post cholecystectomy. The common bile duct remains normal in caliber. Pancreas: The pancreas is within normal limits. Spleen: The spleen is unremarkable in appearance. Adrenals/Urinary Tract: The adrenal glands are unremarkable in appearance. The kidneys are within normal limits. There is no evidence of hydronephrosis. No renal or ureteral stones are identified. No perinephric stranding is seen. Stomach/Bowel: The stomach is unremarkable in appearance. The small bowel is within normal limits. The appendix is normal in caliber, without evidence of appendicitis. There is fatty infiltration within the wall of the ascending and proximal transverse colon, likely reflecting sequelae of chronic inflammation. Scattered diverticulosis is noted along the sigmoid colon, without evidence of diverticulitis. Vascular/Lymphatic: Scattered calcification is seen along the abdominal aorta and its branches. There is slight ectasia of the abdominal aorta to 2.6 cm in AP dimension. The inferior vena cava is grossly unremarkable. No retroperitoneal lymphadenopathy is seen. No pelvic sidewall lymphadenopathy is identified. Reproductive: The bladder is grossly unremarkable. The patient is status post hysterectomy. No suspicious adnexal masses are seen. Other: No additional soft tissue abnormalities are seen. Musculoskeletal: No acute osseous abnormalities are identified. Vacuum phenomenon  and endplate sclerotic change are noted along the lumbar spine.  The visualized musculature is unremarkable in appearance. IMPRESSION: 1. No acute abnormality seen to explain the patient's symptoms. 2. Scattered diverticulosis along the sigmoid colon, without evidence of diverticulitis. Sequelae of chronic inflammation along the ascending and proximal transverse colon. 3. Ectatic abdominal aorta at risk for aneurysm development. Recommend followup by ultrasound in 5 years. This recommendation follows ACR consensus guidelines: White Paper of the ACR Incidental Findings Committee II on Vascular Findings. J Am Coll Radiol 2013; 10:789-794. Aortic Atherosclerosis (ICD10-I70.0). Electronically Signed   By: Garald Balding M.D.   On: 12/30/2017 04:29     Assessment and plan- Patient is a 72 y.o. female who presents for persistent diarrhea x6 days.  Evaluated in the emergency room last night.  Given IV fluids for dehydration. CT abdomen/pelvis did not reveal any abnormalities.  Treated with 5-day course of Cipro for mild urinary tract infection.  1.  Metastatic breast cancer: Currently tolerating letrozole and Ibrance well.  CA 27-29 has stabilized.  PET scan from September 2018 did not show evidence of hypermetabolic soft tissue metastasis.  She routinely follows up with Dr. Grayland Ormond every 6 weeks with labs and every 12 weeks with labs and MD assessment.   2. Diarrhea: Likely foodborne.  We will redraw labs today to see if there is improvement.  Patient is afebrile.  Mild left lower quadrant abdominal pain.  Labs show hypomagnesemia with magnesium level of 1.5.  Will replace with 2 g IV magnesium and 1 L IV fluids.  Given work-up in emergency room was negative for GI etiology we will treat symptomatically with IV fluids as needed and antidiarrheals.  Patient instructed to stay adequately hydrated.  RX Lomotil 2.5 mg PRN for diarrhea.  Patient in agreement with plan.  All questions were answered.  ED work-up negative for C-Diff, GI Pathogen and CT Scan negative for  abnormalities. Actively being treated for UTI with Cipro for 5 days.     Visit Diagnosis 1. Hypomagnesemia   2. Diarrhea, unspecified type     Patient expressed understanding and was in agreement with this plan. She also understands that She can call clinic at any time with any questions, concerns, or complaints.   Greater than 50% was spent in counseling and coordination of care with this patient including but not limited to discussion of the relevant topics above (See A&P) including, but not limited to diagnosis and management of acute and chronic medical conditions.    Faythe Casa, AGNP-C [facility]- [phone] Pager- [phone] 01/01/2018 2:44 PM

## 2018-01-06 MED FILL — IBRANCE 125 MG CAPSULE: 125 | 28 days supply | Qty: 21 | Fill #1

## 2018-01-12 ENCOUNTER — Other Ambulatory Visit: Payer: Self-pay | Admitting: Oncology

## 2018-01-12 DIAGNOSIS — C801 Malignant (primary) neoplasm, unspecified: Secondary | ICD-10-CM

## 2018-01-12 DIAGNOSIS — C50911 Malignant neoplasm of unspecified site of right female breast: Secondary | ICD-10-CM

## 2018-01-14 ENCOUNTER — Other Ambulatory Visit: Payer: Self-pay | Admitting: *Deleted

## 2018-01-14 NOTE — Telephone Encounter (Signed)
This was done yestreday

## 2018-02-01 NOTE — Progress Notes (Signed)
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  INTERVAL HISTORY: Patient returns to clinic today for repeat laboratory work and further evaluation.  She continues to have chronic fatigue, but otherwise is tolerating Ibrance and letrozole well. She has no neurologic complaints.  She denies any recent fevers or illnesses.  She denies any chest pain or shortness of breath. She has a fair appetite and denies weight loss. She denies nausea, vomiting, or constipation.  She admits to occasional diarrhea. She has no melena or hematochezia.  She has no urinary complaints.  Patient offers no further specific complaints today.  REVIEW OF SYSTEMS:   Review of Systems  Constitutional: Positive for malaise/fatigue. Negative for fever and weight loss.  Respiratory: Negative.  Negative for cough and shortness of breath.   Cardiovascular: Negative.  Negative for chest pain and leg swelling.  Gastrointestinal: Positive for diarrhea. Negative for abdominal pain, blood in stool and melena.  Genitourinary: Negative.   Musculoskeletal: Negative.  Negative for back pain and joint pain.  Skin: Negative.  Negative for rash.  Neurological: Negative.  Negative for sensory change, focal weakness and weakness.  Psychiatric/Behavioral: Negative.  The patient is not nervous/anxious.     As per HPI. Otherwise, a complete review of systems is negative.  PAST MEDICAL HISTORY: Past Medical History:  Diagnosis Date  . Anxiety   . Arthritis    "back" (04/03/2016)  . Breast cancer ([place]) 2012    no lumpectomy. Treating with meds- Right  . Cancer  ([place])    small intestines 2012, gallbladder 2016  . Chronic lower back pain   . COPD (chronic obstructive pulmonary disease) ([place])    "use inhalers for COPD that dx'd at one time" (04/03/2016)  . Family history of adverse reaction to anesthesia    daughter gets nauseated  . GERD (gastroesophageal reflux disease)   . High cholesterol   . Hypertension   . Hypothyroidism   . Skin cancer    "right neck; mid forehead"    PAST SURGICAL HISTORY: Past Surgical History:  Procedure Laterality Date  . ABDOMINAL HERNIA REPAIR     "after they took tumor out of my colon I developed a hernia"  . ABDOMINAL HYSTERECTOMY    . BACK SURGERY    . BREAST BIOPSY Right 2012   positive  . BREAST BIOPSY Right 2014   positive  . BREAST BIOPSY Right 06/26/2016   US guided - waiting for pathology  . BREAST EXCISIONAL BIOPSY Right 1995   benign  . CARPAL TUNNEL RELEASE Left   . DILATION AND CURETTAGE OF UTERUS     S/P miscarriage  . IR GENERIC HISTORICAL  04/04/2016   IR LUMBAR DISC ASPIRATION W/IMG GUIDE 04/04/2016 [name], MD [facility]  . JOINT REPLACEMENT    . LAPAROSCOPIC CHOLECYSTECTOMY  2016  . SKIN CANCER EXCISION     "right neck; mid forehead"  . TOTAL HIP ARTHROPLASTY Right 10/03/2015   Procedure: TOTAL HIP ARTHROPLASTY ANTERIOR APPROACH;  Surgeon: Hessie Knows, MD;  Location: [facility] ORS;  Service: Orthopedics;  Laterality: Right;  . TOTAL HIP ARTHROPLASTY Left 07/25/2016   Procedure: TOTAL HIP ARTHROPLASTY ANTERIOR APPROACH;  Surgeon: Hessie Knows,  MD;  Location: [facility] ORS;  Service: Orthopedics;  Laterality: Left;  . TUBAL LIGATION    . TUMOR EXCISION  2012   "small intestine; came from the breast cancer"    FAMILY HISTORY: Reviewed and unchanged. No reported history of breast cancer or other chronic diseases.     ADVANCED DIRECTIVES:    HEALTH MAINTENANCE: Social History   Tobacco Use  . Smoking status: Current Every Day Smoker    Packs/day: 1.00    Years: 54.00     Pack years: 54.00    Types: Cigarettes  . Smokeless tobacco: Never Used  . Tobacco comment: not ready to quit  Substance Use Topics  . Alcohol use: No  . Drug use: No     Colonoscopy:  PAP:  Bone density:  Lipid panel:  Allergies  Allergen Reactions  . Carafate [Sucralfate] Nausea Only  . Diclofenac Sodium Nausea Only  . Erythromycin Diarrhea and Nausea And Vomiting  . Adhesive [Tape] Itching and Rash  . Wellbutrin [Bupropion] Itching and Rash    Current Outpatient Medications  Medication Sig Dispense Refill  . cetirizine (ZYRTEC) 10 MG tablet Take 10 mg by mouth daily as needed for allergies. Reported on 10/03/2015    . ciprofloxacin (CIPRO) 500 MG tablet Take 1 tablet (500 mg total) by mouth 2 (two) times daily. 10 tablet 0  . citalopram (CELEXA) 20 MG tablet Take 20 mg by mouth every morning.     . dicyclomine (BENTYL) 10 MG capsule Take 10 mg by mouth 3 (three) times daily as needed for spasms.    Marland Kitchen FLUTICASONE PROPIONATE EX Apply topically.    . Fluticasone-Salmeterol (ADVAIR) 100-50 MCG/DOSE AEPB Inhale 1 puff into the lungs every morning.     Marland Kitchen letrozole (FEMARA) 2.5 MG tablet TAKE 1 TABLET EVERY DAY 90 tablet 4  . levothyroxine (SYNTHROID, LEVOTHROID) 100 MCG tablet Take 100 mcg by mouth daily before breakfast.     . montelukast (SINGULAIR) 10 MG tablet Take 10 mg by mouth at bedtime.     . Multiple Vitamins-Minerals (CENTRUM SILVER PO) Take 1 tablet by mouth every morning.    Marland Kitchen omeprazole (PRILOSEC) 20 MG capsule Take 20 mg by mouth every morning.     . polyethylene glycol (MIRALAX / GLYCOLAX) packet Take 17 g by mouth daily. 14 each 0  . pravastatin (PRAVACHOL) 40 MG tablet Take 40 mg by mouth at bedtime.     . valsartan-hydrochlorothiazide (DIOVAN-HCT) 80-12.5 MG tablet Take 1 tablet by mouth daily.    Marland Kitchen zolpidem (AMBIEN) 10 MG tablet TAKE 1 TABLET BY MOUTH EVERY DAY AT BEDTIME AS NEEDED FOR SLEEP 30 tablet 2  . acetaminophen (TYLENOL) 500 MG tablet Take 1,000 mg by  mouth daily as needed for mild pain or fever.     . diphenoxylate-atropine (LOMOTIL) 2.5-0.025 MG tablet Take 1 tablet by mouth 4 (four) times daily as needed for diarrhea or loose stools. (Patient not taking: Reported on 02/03/2018) 30 tablet 0  . ondansetron (ZOFRAN ODT) 4 MG disintegrating tablet Take 1 tablet (4 mg total) by mouth every 8 (eight) hours as needed for nausea or vomiting. (Patient not taking: Reported on 02/03/2018) 20 tablet 0  . palbociclib (IBRANCE) 100 MG capsule Take 1 capsule (100 mg total) by mouth daily. Take whole with food. Take for 21 days on, 7 days off, repeat every 28 days. 21 capsule 4  . prochlorperazine (COMPAZINE) 10 MG tablet Take 1 tablet (10 mg total) by mouth every 6 (six) hours  as needed for nausea or vomiting. (Patient not taking: Reported on 02/03/2018) 30 tablet 2  . triamcinolone cream (KENALOG) 0.1 % APPLY TO AFFECTED AREA TWICE A DAY UNTIL CLEAR     No current facility-administered medications for this visit.     OBJECTIVE: Vitals:   02/03/18 1026  BP: 116/71  Pulse: 65  Resp: 18  Temp: (!) 96.2 F (35.7 C)     Body mass index is 24.43 kg/m.    ECOG FS:1 - Symptomatic but completely ambulatory  General: Well-developed, well-nourished, no acute distress. Eyes: Pink conjunctiva, anicteric sclera. Breast: Patient requested exam be deferred today. Lungs: Clear to auscultation bilaterally. Heart: Regular rate and rhythm. No rubs, murmurs, or gallops. Abdomen: Soft, nontender, nondistended. No organomegaly noted, normoactive bowel sounds. Musculoskeletal: No edema, cyanosis, or clubbing. Neuro: Alert, answering all questions appropriately. Cranial nerves grossly intact. Skin: No rashes or petechiae noted. Psych: Normal affect.  LAB RESULTS: Lab Results  Component Value Date   NA 139 02/03/2018   K 4.3 02/03/2018   CL 104 02/03/2018   CO2 26 02/03/2018   GLUCOSE 94 02/03/2018   BUN 25 (H) 02/03/2018   CREATININE 1.22 (H) 02/03/2018    CALCIUM 9.5 02/03/2018   PROT 6.9 02/03/2018   ALBUMIN 4.1 02/03/2018   AST 24 02/03/2018   ALT 16 02/03/2018   ALKPHOS 65 02/03/2018   BILITOT 0.5 02/03/2018   GFRNONAA 43 (L) 02/03/2018   GFRAA 50 (L) 02/03/2018    Lab Results  Component Value Date   WBC 2.0 (L) 02/03/2018   NEUTROABS 1.3 (L) 02/03/2018   HGB 11.5 (L) 02/03/2018   HCT 32.5 (L) 02/03/2018   MCV 108.3 (H) 02/03/2018   PLT 103 (L) 02/03/2018    STUDIES: No results found.  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  PLAN:    1.  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  Recent biopsy of right breast consistent with patient's known primary.  Patient's most recent PET scan on April 29, 2017 reviewed independently with no evidence of hypermetabolic soft tissue metastasis.  Given patient's pancytopenia, will dose reduce Ibrance to 100 mg every 21 days with 7 days off along with daily letrozole.  Return to clinic in 4 and 8 weeks for laboratory work only and then in 3 months for routine evaluation.    2.  Hip/back pain: Significantly improved. Patient is status post bilateral hip replacement. 3.  Anemia: Patient's hemoglobin is decreased, but chronic and unchanged. 4.  Renal insufficiency: Patient's creatinine appears to be approximately her baseline.  Monitor.   5.  Leukopenia: Slightly worse today.  Dose reduced Ibrance as above. 6.  Thrombocytopenia: Secondary to Ibrance.  Dose reduction as above. 7.  Diarrhea: Mild, continue OTC Imodium as needed.  Patient expressed understanding and was in agreement with this plan. She also understands that She can call clinic at any time with any questions, concerns, or complaints.    Lloyd Huger, MD    02/08/2018 7:31 AM

## 2018-02-03 ENCOUNTER — Other Ambulatory Visit: Payer: Self-pay

## 2018-02-03 ENCOUNTER — Inpatient Hospital Stay (HOSPITAL_BASED_OUTPATIENT_CLINIC_OR_DEPARTMENT_OTHER): Payer: Medicare Other | Admitting: Oncology

## 2018-02-03 ENCOUNTER — Inpatient Hospital Stay: Payer: Medicare Other | Attending: Oncology

## 2018-02-03 ENCOUNTER — Other Ambulatory Visit: Payer: Self-pay | Admitting: Pharmacist

## 2018-02-03 VITALS — BP 116/71 | HR 65 | Temp 96.2°F | Resp 18 | Wt 142.3 lb

## 2018-02-03 DIAGNOSIS — D6959 Other secondary thrombocytopenia: Secondary | ICD-10-CM

## 2018-02-03 DIAGNOSIS — N289 Disorder of kidney and ureter, unspecified: Secondary | ICD-10-CM | POA: Insufficient documentation

## 2018-02-03 DIAGNOSIS — C50411 Malignant neoplasm of upper-outer quadrant of right female breast: Secondary | ICD-10-CM | POA: Diagnosis present

## 2018-02-03 DIAGNOSIS — C7989 Secondary malignant neoplasm of other specified sites: Secondary | ICD-10-CM

## 2018-02-03 DIAGNOSIS — M25559 Pain in unspecified hip: Secondary | ICD-10-CM | POA: Diagnosis not present

## 2018-02-03 DIAGNOSIS — R5382 Chronic fatigue, unspecified: Secondary | ICD-10-CM | POA: Insufficient documentation

## 2018-02-03 DIAGNOSIS — C784 Secondary malignant neoplasm of small intestine: Secondary | ICD-10-CM | POA: Diagnosis not present

## 2018-02-03 DIAGNOSIS — Z170 Estrogen receptor positive status [ER+]: Secondary | ICD-10-CM

## 2018-02-03 DIAGNOSIS — C50911 Malignant neoplasm of unspecified site of right female breast: Secondary | ICD-10-CM

## 2018-02-03 DIAGNOSIS — D72829 Elevated white blood cell count, unspecified: Secondary | ICD-10-CM | POA: Diagnosis not present

## 2018-02-03 DIAGNOSIS — Z720 Tobacco use: Secondary | ICD-10-CM

## 2018-02-03 DIAGNOSIS — R197 Diarrhea, unspecified: Secondary | ICD-10-CM | POA: Diagnosis not present

## 2018-02-03 DIAGNOSIS — D649 Anemia, unspecified: Secondary | ICD-10-CM

## 2018-02-03 DIAGNOSIS — C7951 Secondary malignant neoplasm of bone: Secondary | ICD-10-CM | POA: Diagnosis not present

## 2018-02-03 DIAGNOSIS — D61818 Other pancytopenia: Secondary | ICD-10-CM

## 2018-02-03 DIAGNOSIS — M545 Low back pain: Secondary | ICD-10-CM | POA: Diagnosis not present

## 2018-02-03 LAB — COMPREHENSIVE METABOLIC PANEL
ALBUMIN: 4.1 g/dL (ref 3.5–5.0)
ALT: 16 U/L (ref 14–54)
AST: 24 U/L (ref 15–41)
Alkaline Phosphatase: 65 U/L (ref 38–126)
Anion gap: 9 (ref 5–15)
BUN: 25 mg/dL — AB (ref 6–20)
CHLORIDE: 104 mmol/L (ref 101–111)
CO2: 26 mmol/L (ref 22–32)
CREATININE: 1.22 mg/dL — AB (ref 0.44–1.00)
Calcium: 9.5 mg/dL (ref 8.9–10.3)
GFR calc Af Amer: 50 mL/min — ABNORMAL LOW (ref >60)
GFR, EST NON AFRICAN AMERICAN: 43 mL/min — AB (ref >60)
GLUCOSE: 94 mg/dL (ref 65–99)
Potassium: 4.3 mmol/L (ref 3.5–5.1)
Sodium: 139 mmol/L (ref 135–145)
Total Bilirubin: 0.5 mg/dL (ref 0.3–1.2)
Total Protein: 6.9 g/dL (ref 6.5–8.1)

## 2018-02-03 LAB — CBC WITH DIFFERENTIAL/PLATELET
Basophils Absolute: 0.1 10*3/uL (ref 0–0.1)
Basophils Relative: 3 %
EOS ABS: 0 10*3/uL (ref 0–0.7)
EOS PCT: 2 %
HCT: 32.5 % — ABNORMAL LOW (ref 35.0–47.0)
Hemoglobin: 11.5 g/dL — ABNORMAL LOW (ref 12.0–16.0)
Lymphocytes Relative: 24 %
Lymphs Abs: 0.5 10*3/uL — ABNORMAL LOW (ref 1.0–3.6)
MCH: 38.4 pg — AB (ref 26.0–34.0)
MCHC: 35.5 g/dL (ref 32.0–36.0)
MCV: 108.3 fL — AB (ref 80.0–100.0)
MONO ABS: 0.1 10*3/uL — AB (ref 0.2–0.9)
MONOS PCT: 6 %
Neutro Abs: 1.3 10*3/uL — ABNORMAL LOW (ref 1.4–6.5)
Neutrophils Relative %: 65 %
PLATELETS: 103 10*3/uL — AB (ref 150–440)
RBC: 3 MIL/uL — AB (ref 3.80–5.20)
RDW: 14.8 % — AB (ref 11.5–14.5)
WBC: 2 10*3/uL — AB (ref 3.6–11.0)

## 2018-02-03 MED ORDER — PALBOCICLIB 100 MG PO CAPS: 100.0000 mg | ORAL_CAPSULE | Freq: Every day | ORAL | 4 refills | Status: DC

## 2018-02-03 MED FILL — IBRANCE 100 MG CAPSULE: 100 | 28 days supply | Qty: 21 | Fill #0

## 2018-02-03 NOTE — Progress Notes (Signed)
Here for follow up. Overall stated that " Im doing pretty good- still w fatigue and occasional diarrhea x 1 yesterday .- I can deal w it "

## 2018-02-04 LAB — CANCER ANTIGEN 27.29: CAN 27.29: 33.3 U/mL (ref 0.0–38.6)

## 2018-03-02 ENCOUNTER — Telehealth: Payer: Self-pay | Admitting: Pharmacy Technician

## 2018-03-02 MED FILL — IBRANCE 100 MG CAPSULE: 100 | 28 days supply | Qty: 21 | Fill #1

## 2018-03-02 NOTE — Telephone Encounter (Signed)
Oral Oncology Patient Advocate Encounter  Was successful in securing patient an $11 grant from [facility]) to provide copayment coverage for Ibrance.  This will keep the out of pocket expense at $0.    The billing information is as follows and has been shared with [place].   Member ID: [id number] Group ID: [id number] RxBin: [id number] Dates of Eligibility: 12/02/17 through 03/02/19  [place] Patient [place] Phone [phone] Fax [phone] 03/02/2018 9:31 AM

## 2018-03-05 ENCOUNTER — Inpatient Hospital Stay: Payer: Medicare Other | Attending: Oncology

## 2018-03-05 DIAGNOSIS — C50411 Malignant neoplasm of upper-outer quadrant of right female breast: Secondary | ICD-10-CM | POA: Insufficient documentation

## 2018-03-05 DIAGNOSIS — C7989 Secondary malignant neoplasm of other specified sites: Secondary | ICD-10-CM | POA: Diagnosis not present

## 2018-03-05 DIAGNOSIS — C784 Secondary malignant neoplasm of small intestine: Secondary | ICD-10-CM | POA: Insufficient documentation

## 2018-03-05 DIAGNOSIS — C7951 Secondary malignant neoplasm of bone: Secondary | ICD-10-CM | POA: Insufficient documentation

## 2018-03-05 DIAGNOSIS — C50911 Malignant neoplasm of unspecified site of right female breast: Secondary | ICD-10-CM

## 2018-03-05 LAB — COMPREHENSIVE METABOLIC PANEL
ALBUMIN: 4.2 g/dL (ref 3.5–5.0)
ALK PHOS: 63 U/L (ref 38–126)
ALT: 15 U/L (ref 0–44)
AST: 22 U/L (ref 15–41)
Anion gap: 9 (ref 5–15)
BUN: 24 mg/dL — ABNORMAL HIGH (ref 8–23)
CHLORIDE: 105 mmol/L (ref 98–111)
CO2: 24 mmol/L (ref 22–32)
CREATININE: 1.2 mg/dL — AB (ref 0.44–1.00)
Calcium: 9.2 mg/dL (ref 8.9–10.3)
GFR calc Af Amer: 51 mL/min — ABNORMAL LOW (ref >60)
GFR calc non Af Amer: 44 mL/min — ABNORMAL LOW (ref >60)
GLUCOSE: 106 mg/dL — AB (ref 70–99)
Potassium: 3.9 mmol/L (ref 3.5–5.1)
SODIUM: 138 mmol/L (ref 135–145)
Total Bilirubin: 0.6 mg/dL (ref 0.3–1.2)
Total Protein: 7 g/dL (ref 6.5–8.1)

## 2018-03-05 LAB — CBC WITH DIFFERENTIAL/PLATELET
Basophils Absolute: 0 10*3/uL (ref 0–0.1)
Basophils Relative: 1 %
EOS ABS: 0 10*3/uL (ref 0–0.7)
EOS PCT: 1 %
HCT: 32.7 % — ABNORMAL LOW (ref 35.0–47.0)
HEMOGLOBIN: 11.4 g/dL — AB (ref 12.0–16.0)
LYMPHS ABS: 0.6 10*3/uL — AB (ref 1.0–3.6)
Lymphocytes Relative: 24 %
MCH: 37.8 pg — AB (ref 26.0–34.0)
MCHC: 34.7 g/dL (ref 32.0–36.0)
MCV: 108.8 fL — ABNORMAL HIGH (ref 80.0–100.0)
MONO ABS: 0.2 10*3/uL (ref 0.2–0.9)
MONOS PCT: 8 %
Neutro Abs: 1.8 10*3/uL (ref 1.4–6.5)
Neutrophils Relative %: 66 %
Platelets: 131 10*3/uL — ABNORMAL LOW (ref 150–440)
RBC: 3.01 MIL/uL — ABNORMAL LOW (ref 3.80–5.20)
RDW: 14.5 % (ref 11.5–14.5)
WBC: 2.7 10*3/uL — ABNORMAL LOW (ref 3.6–11.0)

## 2018-03-06 LAB — CANCER ANTIGEN 27.29: CA 27.29: 35 U/mL (ref 0.0–38.6)

## 2018-03-30 MED FILL — IBRANCE 100 MG CAPSULE: 100 | 28 days supply | Qty: 21 | Fill #2

## 2018-04-02 ENCOUNTER — Inpatient Hospital Stay: Payer: Medicare Other | Attending: Oncology

## 2018-04-02 DIAGNOSIS — C784 Secondary malignant neoplasm of small intestine: Secondary | ICD-10-CM | POA: Insufficient documentation

## 2018-04-02 DIAGNOSIS — C7951 Secondary malignant neoplasm of bone: Secondary | ICD-10-CM | POA: Insufficient documentation

## 2018-04-02 DIAGNOSIS — C50911 Malignant neoplasm of unspecified site of right female breast: Secondary | ICD-10-CM

## 2018-04-02 DIAGNOSIS — C50411 Malignant neoplasm of upper-outer quadrant of right female breast: Secondary | ICD-10-CM | POA: Insufficient documentation

## 2018-04-02 LAB — CBC WITH DIFFERENTIAL/PLATELET
BASOS PCT: 1 %
Basophils Absolute: 0 10*3/uL (ref 0–0.1)
EOS ABS: 0 10*3/uL (ref 0–0.7)
Eosinophils Relative: 1 %
HCT: 32.9 % — ABNORMAL LOW (ref 35.0–47.0)
HEMOGLOBIN: 11.5 g/dL — AB (ref 12.0–16.0)
LYMPHS ABS: 0.6 10*3/uL — AB (ref 1.0–3.6)
Lymphocytes Relative: 18 %
MCH: 38.2 pg — AB (ref 26.0–34.0)
MCHC: 35.1 g/dL (ref 32.0–36.0)
MCV: 108.7 fL — ABNORMAL HIGH (ref 80.0–100.0)
MONOS PCT: 7 %
Monocytes Absolute: 0.2 10*3/uL (ref 0.2–0.9)
NEUTROS ABS: 2.2 10*3/uL (ref 1.4–6.5)
NEUTROS PCT: 73 %
Platelets: 142 10*3/uL — ABNORMAL LOW (ref 150–440)
RBC: 3.02 MIL/uL — ABNORMAL LOW (ref 3.80–5.20)
RDW: 14.6 % — ABNORMAL HIGH (ref 11.5–14.5)
WBC: 3.1 10*3/uL — ABNORMAL LOW (ref 3.6–11.0)

## 2018-04-02 LAB — COMPREHENSIVE METABOLIC PANEL
ALBUMIN: 4.1 g/dL (ref 3.5–5.0)
ALK PHOS: 75 U/L (ref 38–126)
ALT: 13 U/L (ref 0–44)
ANION GAP: 11 (ref 5–15)
AST: 21 U/L (ref 15–41)
BILIRUBIN TOTAL: 0.4 mg/dL (ref 0.3–1.2)
BUN: 33 mg/dL — ABNORMAL HIGH (ref 8–23)
CALCIUM: 9.3 mg/dL (ref 8.9–10.3)
CO2: 24 mmol/L (ref 22–32)
CREATININE: 1.18 mg/dL — AB (ref 0.44–1.00)
Chloride: 103 mmol/L (ref 98–111)
GFR calc Af Amer: 52 mL/min — ABNORMAL LOW (ref >60)
GFR calc non Af Amer: 45 mL/min — ABNORMAL LOW (ref >60)
GLUCOSE: 106 mg/dL — AB (ref 70–99)
Potassium: 4.1 mmol/L (ref 3.5–5.1)
SODIUM: 138 mmol/L (ref 135–145)
TOTAL PROTEIN: 7.1 g/dL (ref 6.5–8.1)

## 2018-04-03 LAB — CANCER ANTIGEN 27.29: CA 27.29: 40.3 U/mL — ABNORMAL HIGH (ref 0.0–38.6)

## 2018-04-12 ENCOUNTER — Other Ambulatory Visit: Payer: Self-pay | Admitting: Oncology

## 2018-04-12 DIAGNOSIS — C801 Malignant (primary) neoplasm, unspecified: Secondary | ICD-10-CM

## 2018-04-12 DIAGNOSIS — C50911 Malignant neoplasm of unspecified site of right female breast: Secondary | ICD-10-CM

## 2018-04-23 MED FILL — IBRANCE 100 MG CAPSULE: 100 | 28 days supply | Qty: 21 | Fill #3

## 2018-05-05 ENCOUNTER — Other Ambulatory Visit: Payer: Medicare Other

## 2018-05-05 ENCOUNTER — Ambulatory Visit: Payer: Medicare Other | Admitting: Oncology

## 2018-05-15 ENCOUNTER — Other Ambulatory Visit: Payer: Self-pay | Admitting: Oncology

## 2018-05-17 NOTE — Progress Notes (Signed)
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  INTERVAL HISTORY: Patient returns to clinic today for repeat laboratory can further evaluation.  Other than chronic fatigue, she is tolerating Ibrance and letrozole well.  She has noticed an dull persistent headache over the past several weeks that is not helped by Advil or Tylenol.  She has no other neurologic complaints. She denies any recent fevers or illnesses.  She denies any chest pain or shortness of breath. She has a fair appetite and denies weight loss. She denies nausea, vomiting, or constipation.  She admits to occasional diarrhea. She has no melena or hematochezia.  She has no urinary complaints.  Patient offers no further specific complaints today.  REVIEW OF SYSTEMS:   Review of Systems  Constitutional: Positive for malaise/fatigue. Negative for fever and weight loss.  Respiratory: Negative.  Negative for cough and shortness of breath.   Cardiovascular: Negative.  Negative for chest pain and leg swelling.  Gastrointestinal: Negative.  Negative for abdominal pain, blood in stool, diarrhea and melena.  Genitourinary: Negative.   Musculoskeletal: Negative.  Negative for back pain and joint pain.  Skin: Negative.  Negative for rash.  Neurological: Positive for headaches. Negative for sensory change, focal weakness and weakness.  Psychiatric/Behavioral: Negative.  The patient is not nervous/anxious.     As per HPI. Otherwise, a complete review of systems is negative.  PAST MEDICAL HISTORY: Past Medical History:  Diagnosis  Date  . Anxiety   . Arthritis    "back" (04/03/2016)  . Breast cancer ([place]) 2012    no lumpectomy. Treating with meds- Right  . Cancer ([place])    small intestines 2012, gallbladder 2016  . Chronic lower back pain   . COPD (chronic obstructive pulmonary disease) ([place])    "use inhalers for COPD that dx'd at one time" (04/03/2016)  . Family history of adverse reaction to anesthesia    daughter gets nauseated  . GERD (gastroesophageal reflux disease)   . High cholesterol   . Hypertension   . Hypothyroidism   . Skin cancer    "right neck; mid forehead"    PAST SURGICAL HISTORY: Past Surgical History:  Procedure Laterality Date  . ABDOMINAL HERNIA REPAIR     "after they took tumor out of my colon I developed a hernia"  . ABDOMINAL HYSTERECTOMY    . BACK SURGERY    . BREAST BIOPSY Right 2012   positive  . BREAST BIOPSY Right 2014   positive  . BREAST BIOPSY Right 06/26/2016   US guided - waiting for pathology  . BREAST EXCISIONAL BIOPSY Right 1995   benign  . CARPAL TUNNEL RELEASE Left   . DILATION AND CURETTAGE OF UTERUS     S/P miscarriage  . IR GENERIC HISTORICAL  04/04/2016   IR LUMBAR DISC ASPIRATION W/IMG GUIDE 04/04/2016 [name], MD [facility]  . JOINT REPLACEMENT    . LAPAROSCOPIC CHOLECYSTECTOMY  2016  . SKIN CANCER EXCISION     "right neck; mid forehead"  . TOTAL HIP ARTHROPLASTY Right 10/03/2015   Procedure: TOTAL HIP ARTHROPLASTY ANTERIOR APPROACH;  Surgeon: Hessie Knows, MD;  Location: [facility] ORS;  Service:  Orthopedics;  Laterality: Right;  . TOTAL HIP ARTHROPLASTY Left 07/25/2016   Procedure: TOTAL HIP ARTHROPLASTY ANTERIOR APPROACH;  Surgeon: Hessie Knows, MD;  Location: [facility] ORS;  Service: Orthopedics;  Laterality: Left;  . TUBAL LIGATION    . TUMOR EXCISION  2012   "small intestine; came from the breast cancer"    FAMILY HISTORY: Reviewed and unchanged. No reported history of breast cancer or other chronic diseases.     ADVANCED DIRECTIVES:     HEALTH MAINTENANCE: Social History   Tobacco Use  . Smoking status: Current Every Day Smoker    Packs/day: 1.00    Years: 54.00    Pack years: 54.00    Types: Cigarettes  . Smokeless tobacco: Never Used  . Tobacco comment: not ready to quit  Substance Use Topics  . Alcohol use: No  . Drug use: No     Colonoscopy:  PAP:  Bone density:  Lipid panel:  Allergies  Allergen Reactions  . Carafate [Sucralfate] Nausea Only  . Diclofenac Sodium Nausea Only  . Erythromycin Diarrhea and Nausea And Vomiting  . Adhesive [Tape] Itching and Rash  . Wellbutrin [Bupropion] Itching and Rash    Current Outpatient Medications  Medication Sig Dispense Refill  . acetaminophen (TYLENOL) 500 MG tablet Take 1,000 mg by mouth daily as needed for mild pain or fever.     . ALPRAZolam (XANAX) 0.5 MG tablet Take by mouth.    . cetirizine (ZYRTEC) 10 MG tablet Take 10 mg by mouth daily as needed for allergies. Reported on 10/03/2015    . citalopram (CELEXA) 20 MG tablet Take 20 mg by mouth every morning.     . cyclobenzaprine (FLEXERIL) 10 MG tablet cyclobenzaprine 10 mg tablet  TAKE 1 TABLET BY MOUTH EVERY 8 HOURS AS NEEDED    . dicyclomine (BENTYL) 10 MG capsule Take 10 mg by mouth 3 (three) times daily as needed for spasms.    Marland Kitchen erythromycin ophthalmic ointment erythromycin 5 mg/gram (0.5 %) eye ointment  APPLY 0.25 INCH(ES) TO EYELIDS AT NIGHT    . fluticasone (FLONASE) 50 MCG/ACT nasal spray fluticasone propionate 50 mcg/actuation nasal spray,suspension    . FLUTICASONE PROPIONATE EX Apply topically.    . Fluticasone-Salmeterol (ADVAIR) 100-50 MCG/DOSE AEPB Inhale 1 puff into the lungs every morning.     . gabapentin (NEURONTIN) 300 MG capsule gabapentin 300 mg capsule    . letrozole (FEMARA) 2.5 MG tablet TAKE 1 TABLET EVERY DAY 90 tablet 4  . levothyroxine (SYNTHROID, LEVOTHROID) 100 MCG tablet Take 100 mcg by mouth daily before breakfast.     . meloxicam (MOBIC) 15 MG tablet meloxicam 15  mg tablet  TAKE 1 TABLET(S) EVERY DAY BY ORAL ROUTE WITH MEALS.    . montelukast (SINGULAIR) 10 MG tablet Take 10 mg by mouth at bedtime.     . Multiple Vitamins-Minerals (CENTRUM SILVER PO) Take 1 tablet by mouth every morning.    Marland Kitchen omeprazole (PRILOSEC) 20 MG capsule Take 20 mg by mouth every morning.     . palbociclib (IBRANCE) 100 MG capsule Take 1 capsule (100 mg total) by mouth daily. Take whole with food. Take for 21 days on, 7 days off, repeat every 28 days. 21 capsule 4  . pravastatin (PRAVACHOL) 40 MG tablet Take 40 mg by mouth at bedtime.     . traMADol (ULTRAM) 50 MG tablet Take 50 mg by mouth every 6 (six) hours as needed. for pain  0  . triamcinolone cream (KENALOG) 0.1 % APPLY  TO AFFECTED AREA TWICE A DAY UNTIL CLEAR    . valsartan-hydrochlorothiazide (DIOVAN-HCT) 80-12.5 MG tablet Take 1 tablet by mouth daily.    Marland Kitchen zolpidem (AMBIEN) 10 MG tablet TAKE 1 TABLET BY MOUTH EVERY DAY AT BEDTIME AS NEEDED FOR SLEEP 30 tablet 2  . diphenoxylate-atropine (LOMOTIL) 2.5-0.025 MG tablet Take 1 tablet by mouth 4 (four) times daily as needed for diarrhea or loose stools. (Patient not taking: Reported on 05/19/2018) 30 tablet 0  . HYDROcodone-acetaminophen (NORCO/VICODIN) 5-325 MG tablet hydrocodone 5 mg-acetaminophen 325 mg tablet    . ondansetron (ZOFRAN ODT) 4 MG disintegrating tablet Take 1 tablet (4 mg total) by mouth every 8 (eight) hours as needed for nausea or vomiting. (Patient not taking: Reported on 05/19/2018) 20 tablet 0  . polyethylene glycol (MIRALAX / GLYCOLAX) packet Take 17 g by mouth daily. (Patient not taking: Reported on 05/19/2018) 14 each 0  . prochlorperazine (COMPAZINE) 10 MG tablet Take 1 tablet (10 mg total) by mouth every 6 (six) hours as needed for nausea or vomiting. (Patient not taking: Reported on 05/19/2018) 30 tablet 2   No current facility-administered medications for this visit.     OBJECTIVE: Vitals:   05/19/18 1033  BP: 122/78  Pulse: 67  Temp: 97.8 F  (36.6 C)     Body mass index is 24.77 kg/m.    ECOG FS:1 - Symptomatic but completely ambulatory  General: Well-developed, well-nourished, no acute distress. Eyes: Pink conjunctiva, anicteric sclera. HEENT: Normocephalic, moist mucous membranes. Lungs: Clear to auscultation bilaterally. Heart: Regular rate and rhythm. No rubs, murmurs, or gallops. Abdomen: Soft, nontender, nondistended. No organomegaly noted, normoactive bowel sounds. Musculoskeletal: No edema, cyanosis, or clubbing. Neuro: Alert, answering all questions appropriately. Cranial nerves grossly intact. Skin: No rashes or petechiae noted. Psych: Normal affect.  LAB RESULTS: Lab Results  Component Value Date   NA 139 05/19/2018   K 3.9 05/19/2018   CL 102 05/19/2018   CO2 27 05/19/2018   GLUCOSE 126 (H) 05/19/2018   BUN 26 (H) 05/19/2018   CREATININE 1.49 (H) 05/19/2018   CALCIUM 9.1 05/19/2018   PROT 6.9 05/19/2018   ALBUMIN 4.0 05/19/2018   AST 22 05/19/2018   ALT 14 05/19/2018   ALKPHOS 71 05/19/2018   BILITOT 0.4 05/19/2018   GFRNONAA 34 (L) 05/19/2018   GFRAA 39 (L) 05/19/2018    Lab Results  Component Value Date   WBC 3.2 (L) 05/19/2018   NEUTROABS 2.3 05/19/2018   HGB 11.2 (L) 05/19/2018   HCT 31.8 (L) 05/19/2018   MCV 107.7 (H) 05/19/2018   PLT 153 05/19/2018    STUDIES: No results found.  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  PLAN:    1.  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  Patient's most recent imaging with PET scan on April 29, 2017 reviewed independently with no evidence of hypermetabolic soft tissue metastasis.  Plan to reimage with MRI of the brain given patient's headache as well as repeat PET scan to assess for  anti-interval changes.  Given patient's pancytopenia, continue with dose reduced Ibrance to 100 mg every 21 days with 7 days off along with daily letrozole.  Return to clinic in 1 month for further evaluation and discussion of her imaging results. 2.  Hip/back pain:  Significantly improved. Patient is status post bilateral hip replacement. 3.  Anemia: Patient's hemoglobin is 11.2 today.  Monitor.   4.  Renal insufficiency: Patient's creatinine appears to be approximately her baseline.  Monitor.   5.  Leukopenia: Decreased secondary to Ibrance.  Monitor. 6.  Thrombocytopenia: Patient's platelet count is now within normal limits. 7.  Diarrhea: Patient does not complain of this today.  Continue OTC Imodium as needed.  Patient expressed understanding and was in agreement with this plan. She also understands that She can call clinic at any time with any questions, concerns, or complaints.    Lloyd Huger, MD   05/22/2018 2:37 PM

## 2018-05-19 ENCOUNTER — Inpatient Hospital Stay: Payer: Medicare Other | Attending: Oncology

## 2018-05-19 ENCOUNTER — Other Ambulatory Visit: Payer: Self-pay

## 2018-05-19 ENCOUNTER — Inpatient Hospital Stay (HOSPITAL_BASED_OUTPATIENT_CLINIC_OR_DEPARTMENT_OTHER): Payer: Medicare Other | Admitting: Oncology

## 2018-05-19 ENCOUNTER — Encounter: Payer: Self-pay | Admitting: Oncology

## 2018-05-19 VITALS — BP 122/78 | HR 67 | Temp 97.8°F | Wt 144.3 lb

## 2018-05-19 DIAGNOSIS — C50411 Malignant neoplasm of upper-outer quadrant of right female breast: Secondary | ICD-10-CM

## 2018-05-19 DIAGNOSIS — R5382 Chronic fatigue, unspecified: Secondary | ICD-10-CM

## 2018-05-19 DIAGNOSIS — D649 Anemia, unspecified: Secondary | ICD-10-CM | POA: Insufficient documentation

## 2018-05-19 DIAGNOSIS — C7989 Secondary malignant neoplasm of other specified sites: Secondary | ICD-10-CM | POA: Insufficient documentation

## 2018-05-19 DIAGNOSIS — D61818 Other pancytopenia: Secondary | ICD-10-CM

## 2018-05-19 DIAGNOSIS — C7951 Secondary malignant neoplasm of bone: Secondary | ICD-10-CM

## 2018-05-19 DIAGNOSIS — N289 Disorder of kidney and ureter, unspecified: Secondary | ICD-10-CM | POA: Diagnosis not present

## 2018-05-19 DIAGNOSIS — D72819 Decreased white blood cell count, unspecified: Secondary | ICD-10-CM

## 2018-05-19 DIAGNOSIS — C784 Secondary malignant neoplasm of small intestine: Secondary | ICD-10-CM

## 2018-05-19 DIAGNOSIS — Z720 Tobacco use: Secondary | ICD-10-CM | POA: Insufficient documentation

## 2018-05-19 DIAGNOSIS — D696 Thrombocytopenia, unspecified: Secondary | ICD-10-CM | POA: Insufficient documentation

## 2018-05-19 DIAGNOSIS — C50911 Malignant neoplasm of unspecified site of right female breast: Secondary | ICD-10-CM

## 2018-05-19 DIAGNOSIS — R7989 Other specified abnormal findings of blood chemistry: Secondary | ICD-10-CM

## 2018-05-19 DIAGNOSIS — R51 Headache: Secondary | ICD-10-CM

## 2018-05-19 DIAGNOSIS — Z170 Estrogen receptor positive status [ER+]: Secondary | ICD-10-CM | POA: Diagnosis not present

## 2018-05-19 DIAGNOSIS — C50811 Malignant neoplasm of overlapping sites of right female breast: Secondary | ICD-10-CM | POA: Insufficient documentation

## 2018-05-19 DIAGNOSIS — M654 Radial styloid tenosynovitis [de Quervain]: Secondary | ICD-10-CM | POA: Insufficient documentation

## 2018-05-19 LAB — COMPREHENSIVE METABOLIC PANEL
ALK PHOS: 71 U/L (ref 38–126)
ALT: 14 U/L (ref 0–44)
AST: 22 U/L (ref 15–41)
Albumin: 4 g/dL (ref 3.5–5.0)
Anion gap: 10 (ref 5–15)
BUN: 26 mg/dL — AB (ref 8–23)
CALCIUM: 9.1 mg/dL (ref 8.9–10.3)
CO2: 27 mmol/L (ref 22–32)
CREATININE: 1.49 mg/dL — AB (ref 0.44–1.00)
Chloride: 102 mmol/L (ref 98–111)
GFR calc Af Amer: 39 mL/min — ABNORMAL LOW (ref >60)
GFR calc non Af Amer: 34 mL/min — ABNORMAL LOW (ref >60)
Glucose, Bld: 126 mg/dL — ABNORMAL HIGH (ref 70–99)
Potassium: 3.9 mmol/L (ref 3.5–5.1)
Sodium: 139 mmol/L (ref 135–145)
Total Bilirubin: 0.4 mg/dL (ref 0.3–1.2)
Total Protein: 6.9 g/dL (ref 6.5–8.1)

## 2018-05-19 LAB — CBC WITH DIFFERENTIAL/PLATELET
BASOS PCT: 1 %
Basophils Absolute: 0 10*3/uL (ref 0–0.1)
EOS ABS: 0 10*3/uL (ref 0–0.7)
EOS PCT: 1 %
HCT: 31.8 % — ABNORMAL LOW (ref 35.0–47.0)
Hemoglobin: 11.2 g/dL — ABNORMAL LOW (ref 12.0–16.0)
Lymphocytes Relative: 20 %
Lymphs Abs: 0.6 10*3/uL — ABNORMAL LOW (ref 1.0–3.6)
MCH: 37.8 pg — ABNORMAL HIGH (ref 26.0–34.0)
MCHC: 35.1 g/dL (ref 32.0–36.0)
MCV: 107.7 fL — ABNORMAL HIGH (ref 80.0–100.0)
Monocytes Absolute: 0.2 10*3/uL (ref 0.2–0.9)
Monocytes Relative: 6 %
Neutro Abs: 2.3 10*3/uL (ref 1.4–6.5)
Neutrophils Relative %: 72 %
PLATELETS: 153 10*3/uL (ref 150–440)
RBC: 2.95 MIL/uL — ABNORMAL LOW (ref 3.80–5.20)
RDW: 14.2 % (ref 11.5–14.5)
WBC: 3.2 10*3/uL — ABNORMAL LOW (ref 3.6–11.0)

## 2018-05-20 ENCOUNTER — Telehealth: Payer: Self-pay | Admitting: *Deleted

## 2018-05-20 LAB — CANCER ANTIGEN 27.29: CA 27.29: 38.4 U/mL (ref 0.0–38.6)

## 2018-05-20 NOTE — Telephone Encounter (Signed)
Asking about referral discussed yesterday, she has not heard anything about an appointment yet. Per VO Dr Grayland Ormond she is to be referred to Nephrology.  Patient advised to give [place] until Friday to get appt

## 2018-05-22 NOTE — Telephone Encounter (Signed)
Per Tillie Rung, referral has been sent to Dr Aleene Davidson

## 2018-05-27 MED FILL — IBRANCE 100 MG CAPSULE: 100 | 28 days supply | Qty: 21 | Fill #4

## 2018-06-04 IMAGING — MR MR HIP*L* WO/W CM
4 of 9 series · 19 of 48 positions shown · IV contrast (multihance)
Comparison: 04/08/2016

CLINICAL DATA: Low back pain, left hip pain

EXAM:
MRI OF THE LEFT HIP WITHOUT AND WITH CONTRAST
TECHNIQUE: Multiplanar, multisequence MR imaging was performed both before and
after administration of intravenous contrast.
CONTRAST:  14mL MULTIHANCE GADOBENATE DIMEGLUMINE 529 MG/ML IV SOLN

[Series 4: T1 · coronal · 5.0mm · 0.70mm/px · 4 of 25 slices shown (1 of 2)]
[im 1/25]
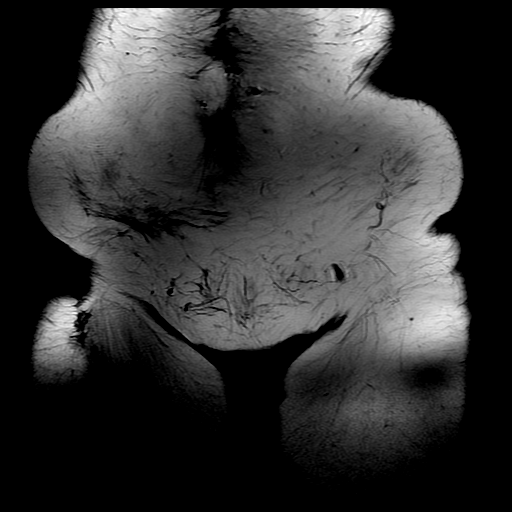
[im 9/25]
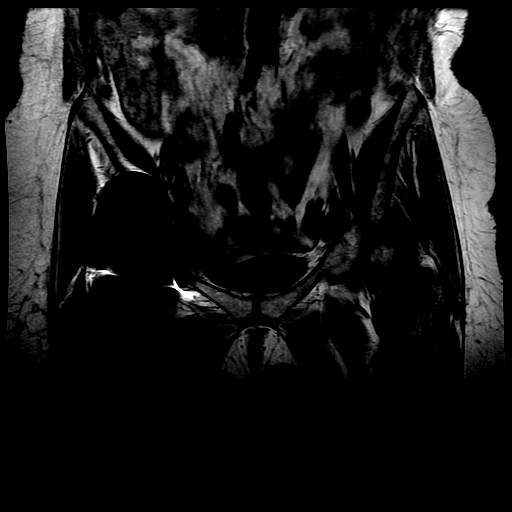
[im 17/25]
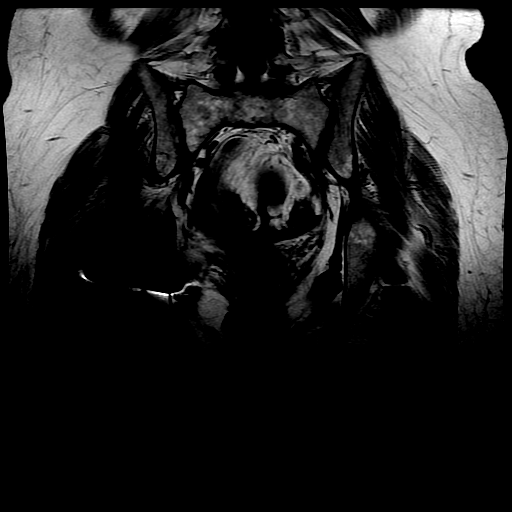
[im 25/25]
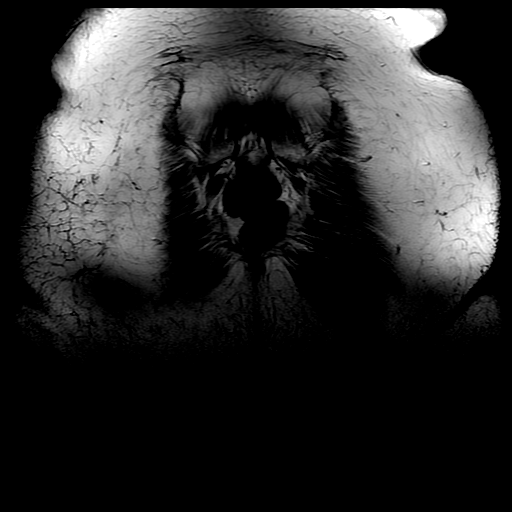

[Series 6: T1 · axial · 6.0mm · 0.62mm/px · z∈[-191,-2]mm · 6 of 28 slices shown (2 of 2)]
[im 1/28]
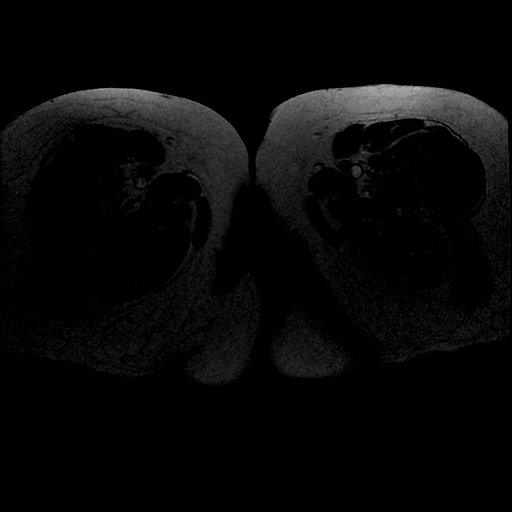
[im 6/28]
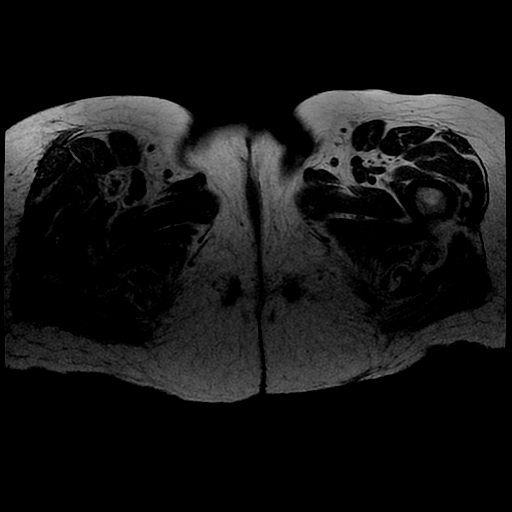
[im 11/28]
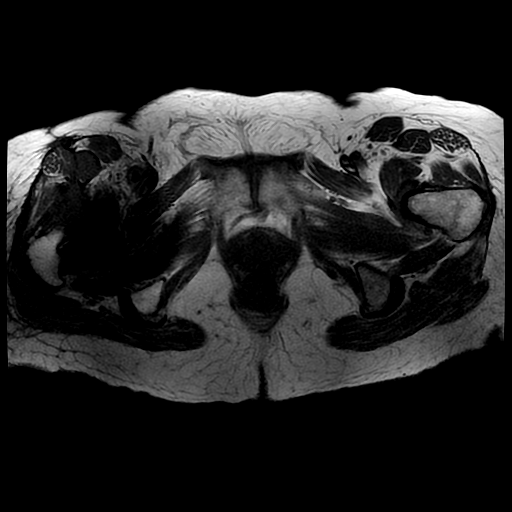
[im 17/28]
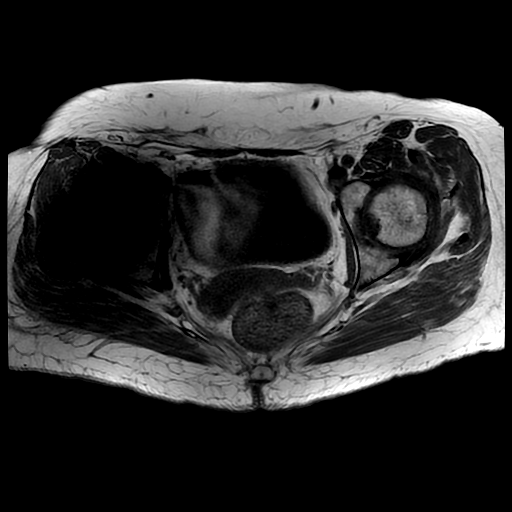
[im 22/28]
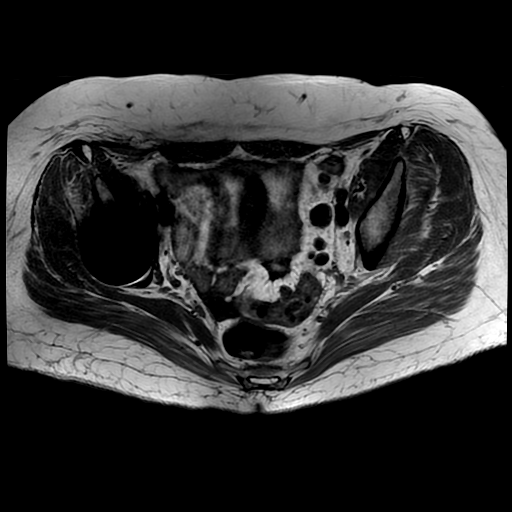
[im 28/28]
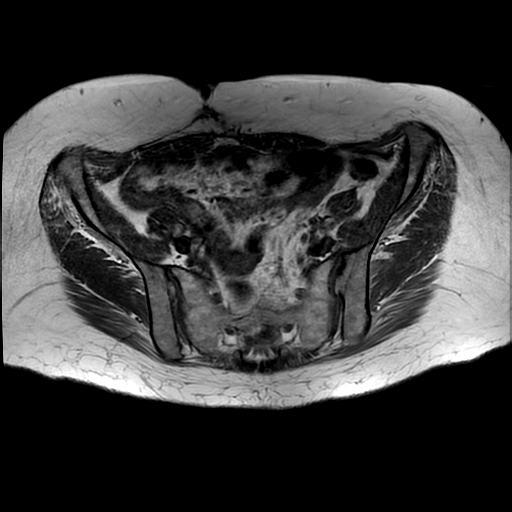

[Series 7: T2 · axial · 6.0mm · 0.62mm/px · z∈[-191,-2]mm · 6 of 28 slices shown]
[im 1/28]
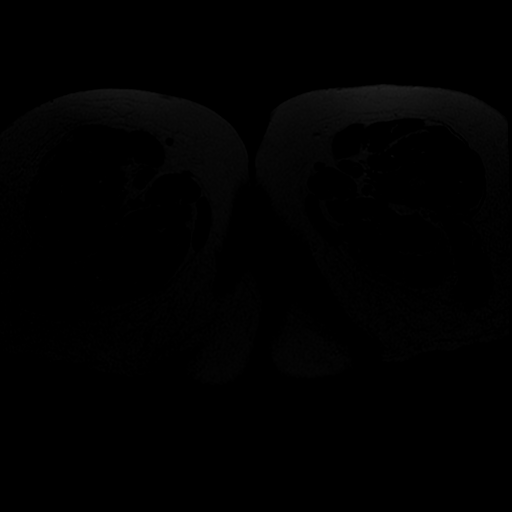
[im 6/28]
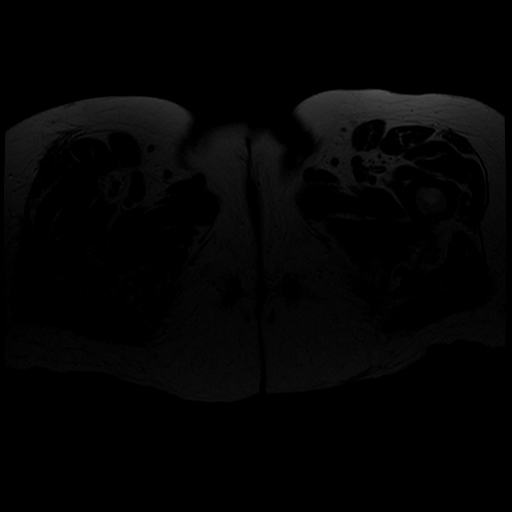
[im 11/28]
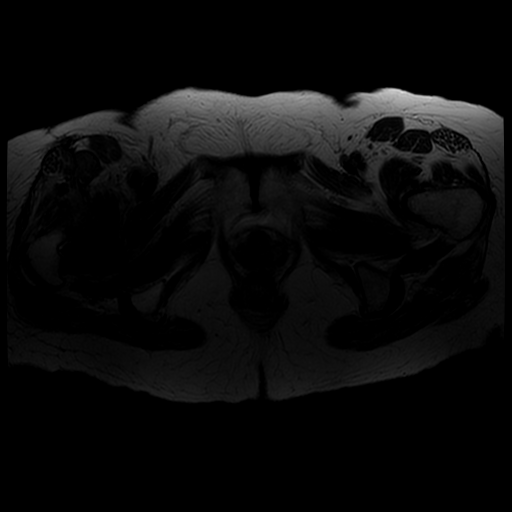
[im 17/28]
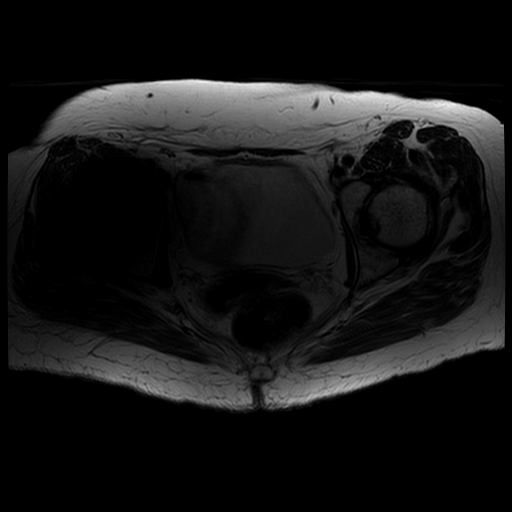
[im 22/28]
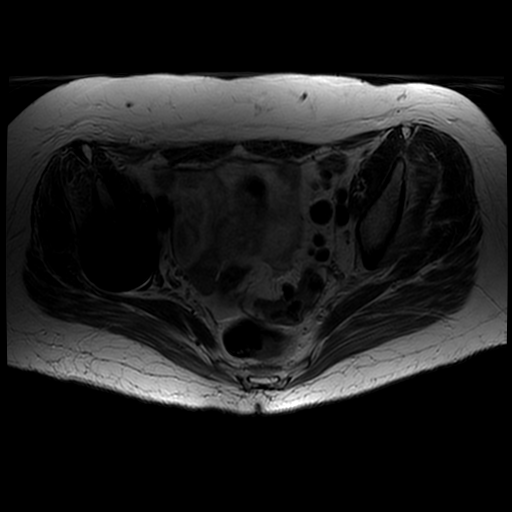
[im 28/28]
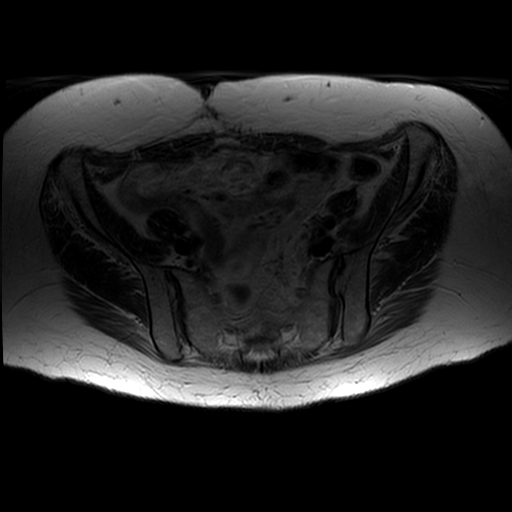

[Series 8: T1 fat-sat · axial · non-contrast · 6.0mm · 0.62mm/px · z∈[-156,-2]mm · 3 of 28 slices shown]
[im 6/28]
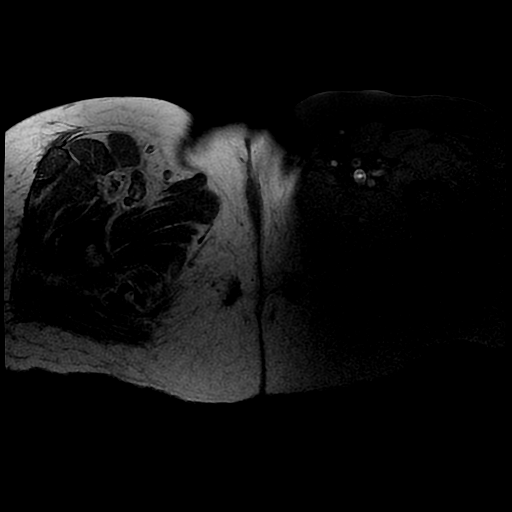
[im 17/28]
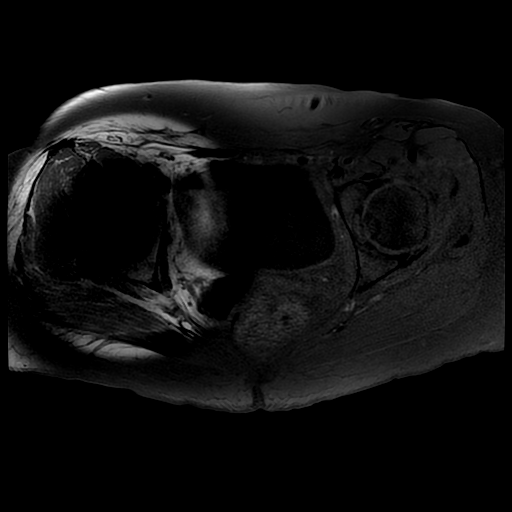
[im 28/28]
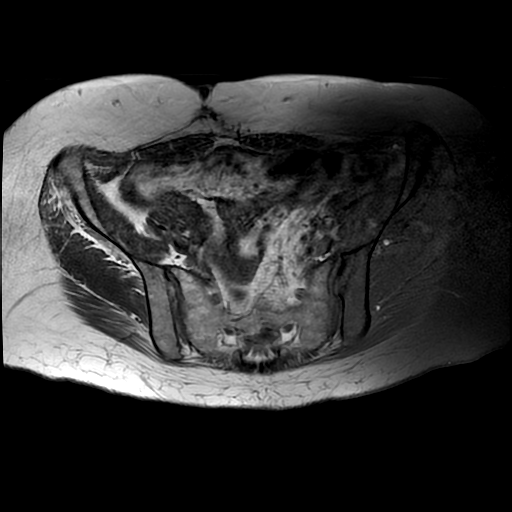

[19 of 48 positions shown; findings below may reference images not displayed]

FINDINGS: Bones: Right total hip arthroplasty with susceptibility artifact
partially obscuring the adjacent soft tissue and osseous structures.
No periarticular fluid collection or osteolysis.

No left hip fracture, dislocation or avascular necrosis. Subchondral
reactive marrow changes.

Degenerative disc disease with disc height loss at L3-4, L4-5 and
L5-S1.

No SI joint widening or erosive changes.

Articular cartilage and labrum

Articular cartilage: High-grade partial-thickness cartilage loss
with areas of full-thickness cartilage loss of the left femoral head
and acetabulum.

Labrum: Superior left labral tear with a 14 x 26 mm paralabral cyst.

Joint or bursal effusion

Joint effusion: Small left hip joint effusion with synovial
enhancement and periarticular enhancement.

Bursae:  No bursa formation.

Muscles and tendons

Muscles and tendons:

Flexors: Normal.

Extensors: Normal.

Abductors: Normal.

Adductors: Normal.

Rotators: Normal.

Hamstrings: Normal.

Other findings

Miscellaneous: No fluid collection or hematoma. Small amount of
pelvic free fluid. Diverticulosis without evidence of
diverticulitis. No inguinal lymphadenopathy. No inguinal hernia. New
IMPRESSION: 1. Moderate -severe osteoarthritis of the left hip. Interval
development of a left hip joint effusion with synovial enhancement
which may be secondary to acute inflammatory process given the
underlying osteoarthritis versus septic arthritis. If there is
concern regarding septic arthritis, recommend arthrocentesis.
2. Superior left labral tear with a paralabral cyst.

## 2018-06-04 IMAGING — MR MR LUMBAR SPINE WO/W CM
4 of 7 series · 16 of 48 positions shown · IV contrast (multihance)
Comparison: 04/08/2016, 04/02/2016 and 09/01/2015 MR.

CLINICAL DATA: 70-year-old female with history of breast cancer.
Treated for discitis. Back and left hip pain getting worse after
stopping antibiotics 1 month ago. Concern for ongoing infection.
Subsequent encounter.

EXAM:
MRI LUMBAR SPINE WITHOUT AND WITH CONTRAST
TECHNIQUE: Multiplanar and multiecho pulse sequences of the lumbar spine were
obtained without and with intravenous contrast.
CONTRAST:  14mL MULTIHANCE GADOBENATE DIMEGLUMINE 529 MG/ML IV SOLN

[Series 3: T2 · sagittal · 4.0mm · 0.55mm/px · 5 of 13 slices shown (1 of 2)]
[im 1/13]
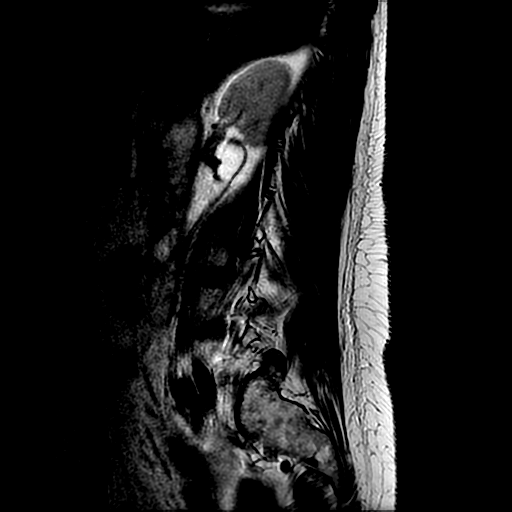
[im 4/13]
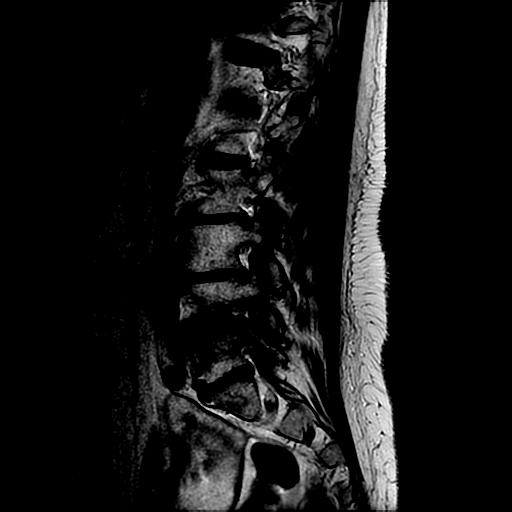
[im 7/13]
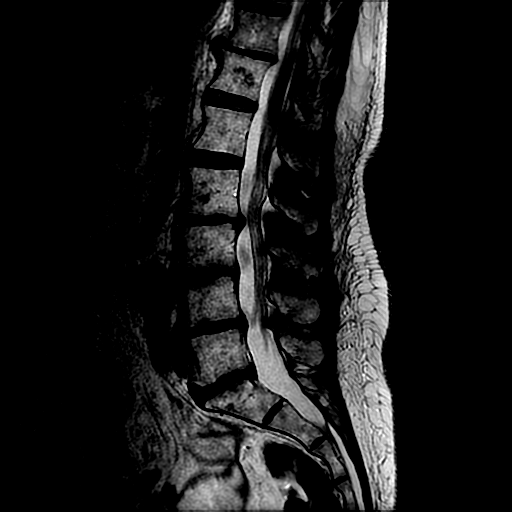
[im 10/13]
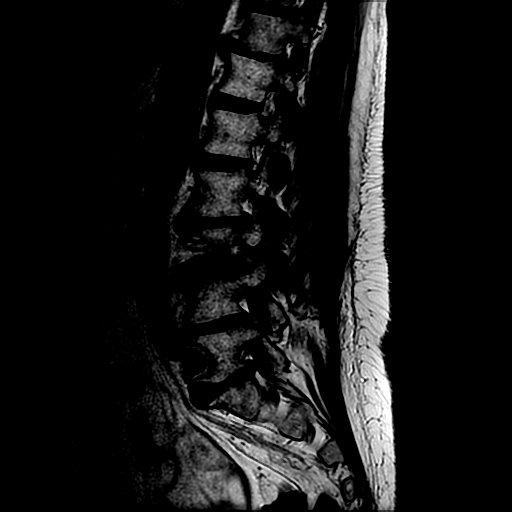
[im 13/13]
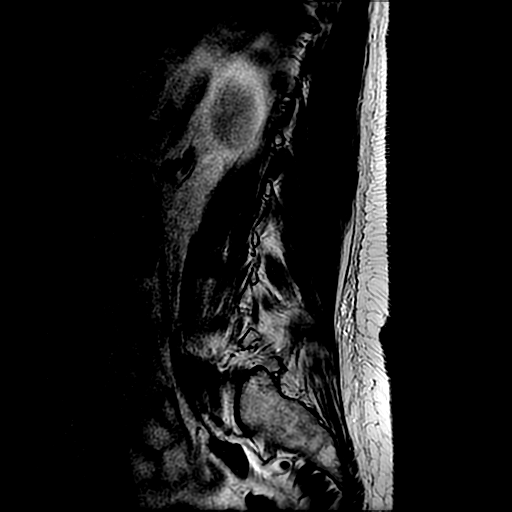

[Series 4: T1 · sagittal · 4.0mm · 0.55mm/px · 3 of 13 slices shown (1 of 2)]
[im 1/13]
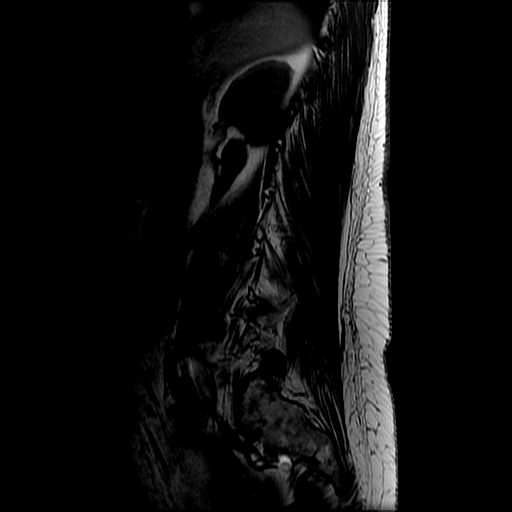
[im 9/13]
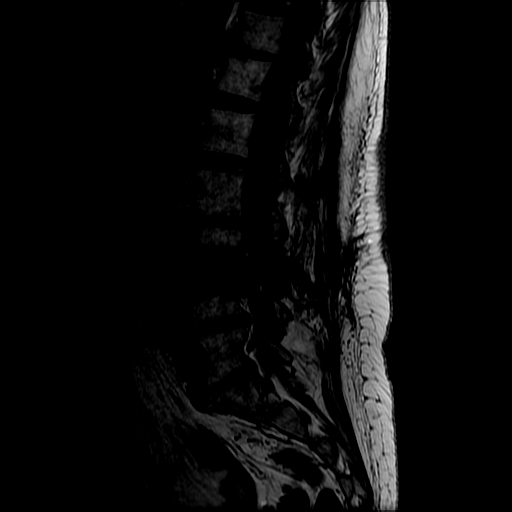
[im 13/13]
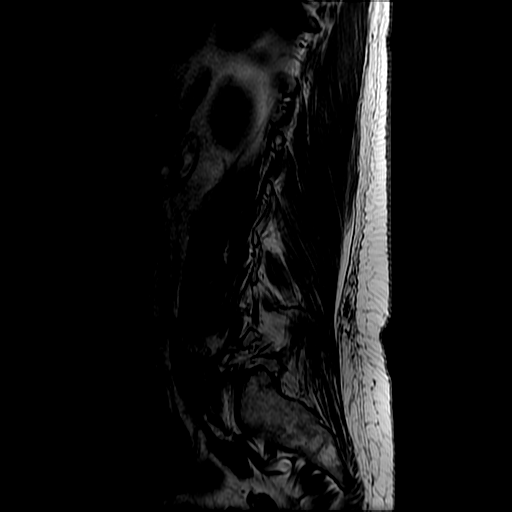

[Series 6: T2 · axial · 4.0mm · 0.39mm/px · z∈[-110,+25]mm · 5 of 29 slices shown (2 of 2)]
[im 1/29]
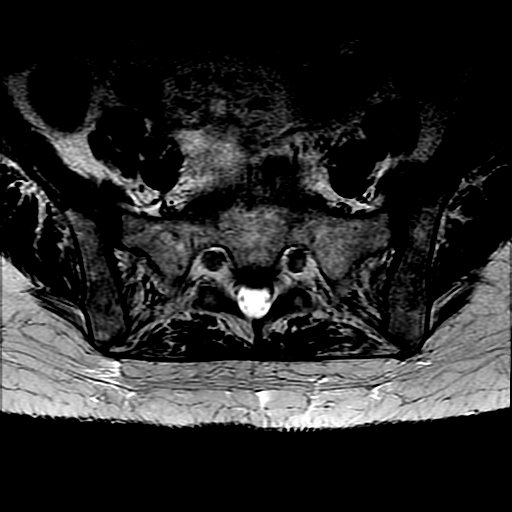
[im 4/29]
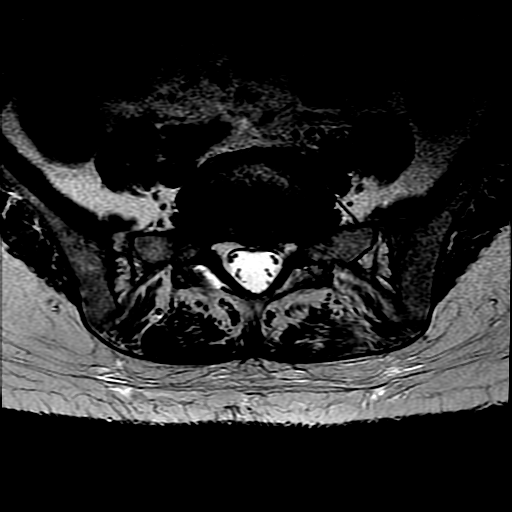
[im 10/29]
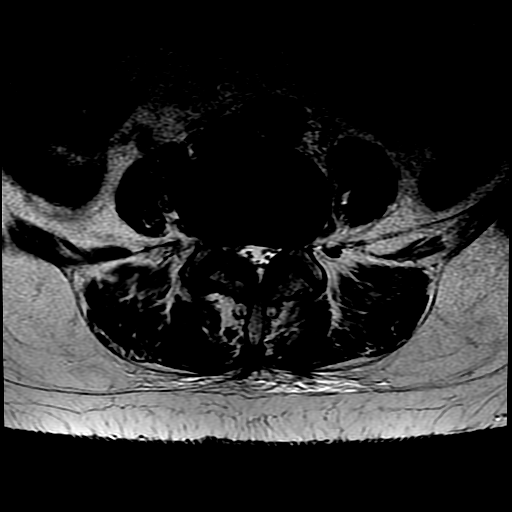
[im 16/29]
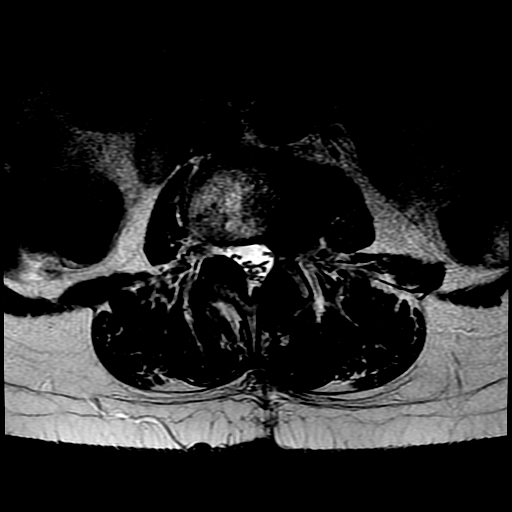
[im 25/29]
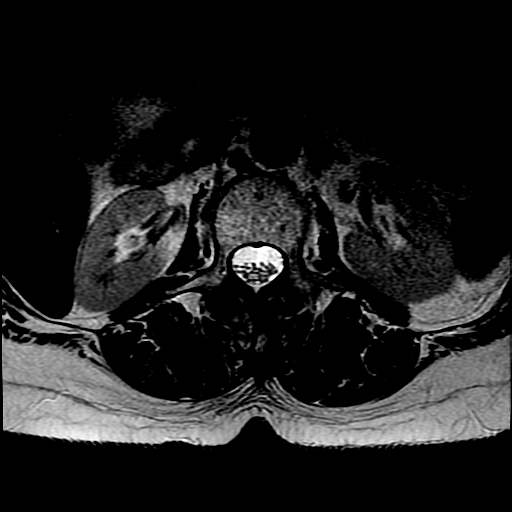

[Series 7: T1 · axial · 4.0mm · 0.39mm/px · z∈[-95,+25]mm · 3 of 29 slices shown (2 of 2)]
[im 4/29]
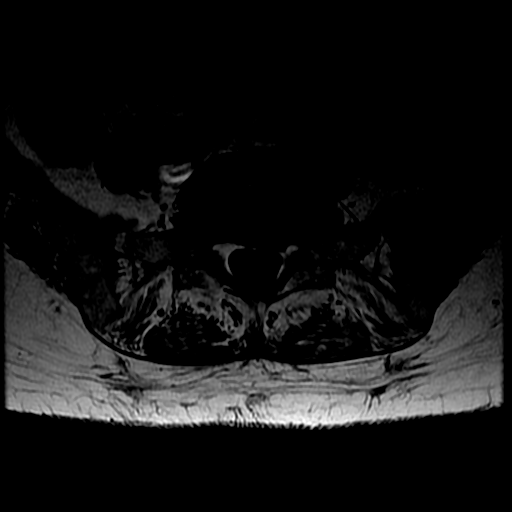
[im 16/29]
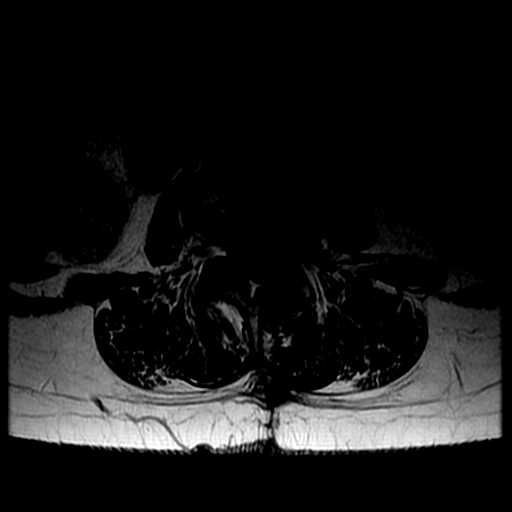
[im 25/29]
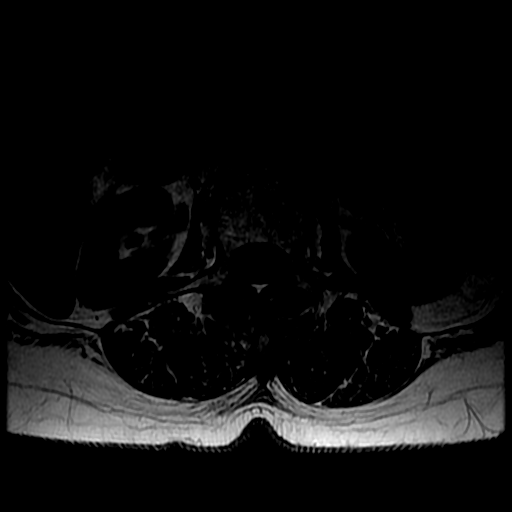

[16 of 48 positions shown; findings below may reference images not displayed]

FINDINGS: Segmentation: Last fully open disk space is labeled L5-S1. Present
examination incorporates from T10-11 disc space through lower
sacrum.

Alignment:  Mild curvature.

Vertebrae: Small sclerotic foci T12, L2 and S[DATE] represent treated
metastatic lesions.

L3-4 endplate reactive changes greater on the left with decreased
level of enhancement. This may represent sequela of discitis. Please
see below.

L4-5 degenerative changes in enhancement greater on the right
unchanged. T11-12 degenerative changes with minimal endplate
reactive changes/enhancement stable.

Conus medullaris: Extends to the L1 level without enhancement.

Paraspinal and other soft tissues: Paraspinal muscle region
enhancement greatest on the left at the L3-4 and L4-5 level. This
enhancement extends towards the left L3-4 and L4-5 facet joint.
Minimal fluid within the left L4-5 facet joint and mild enhancement
surrounding these facet joints. Findings may be related to
degenerative changes and/or treatment of infection without
significant change from the prior examination. Low level infection
without drainable abscess cannot be entirely excluded if of high
clinical concern.

Disc levels:

T10-11:  Negative.

T11-12: Disc degeneration with minimal retrolisthesis T11. Anterior
osteophyte endplate reactive changes. Minimal bulge. Slight
narrowing ventral thecal sac. Minimal facet degenerative changes.

T12-L1:  Negative.

L1-2:  Mild facet bony overgrowth.  Minimal bulge.

L2-3: Moderate facet bony overgrowth and ligamentum flavum
hypertrophy. Moderate bulge/spur. Multifactorial moderate spinal
stenosis. Very mild foraminal narrowing slightly greater on the
right.

L3-4: Prior left hemilaminectomy. Interval improvement with clearing
of masslike enhancing tissue previously noted. Now with diffuse low
level enhancement surrounding the thecal sac and operative site.
Bulge/osteophyte with greater extension left foraminal/ lateral
position with mild mass effect upon the exiting left L3 nerve root.

L4-5: Facet bony overgrowth. Ligamentum flavum hypertrophy greater
on the right. Bulge/ osteophyte with foraminal/ lateral extension
greater to the right. Right greater left foraminal narrowing with
slight encroachment upon the exiting right L4 nerve root. Mild
multifactorial spinal stenosis. Mild left lateral recess narrowing
greater on left.

L5-S1: Broad-based disc osteophyte complex with foraminal/ lateral
extension encroaching upon but not compressing exiting L5 nerve
roots. No significant spinal stenosis.

Atherosclerotic changes aorta with bulge lower abdominal aorta
measuring up to 2.6 cm. Ectatic left common iliac artery measuring
up to 1.4 cm.
IMPRESSION: Interval improvement with decreased L3-4 endplate enhancement and
resolution of epidural enhancing soft tissue process. Minimal
epidural enhancement at this level is symmetric and circumferential.

Paraspinal muscle region enhancement greatest on the left at the
L3-4 and L4-5 level. This enhancement extends towards the left L3-4
and L4-5 facet joint. Minimal fluid within the left L4-5 facet joint
and mild enhancement surrounding these facet joints. Findings may be
related to degenerative changes and/or treatment of infection
without significant change from the prior examination. Low level
infection could not be entirely excluded if of high clinical
concern.

Degenerative changes contribute to:

T11-12 slight narrowing ventral thecal sac.

L2-3 multifactorial moderate spinal stenosis.

L3-4 bulge/osteophyte with greater extension left foraminal/ lateral
position with mild mass effect upon the exiting left L3 nerve root.

L4-5 right greater left foraminal narrowing with slight encroachment
upon the exiting right L4 nerve root. Mild multifactorial spinal
stenosis. Mild left lateral recess narrowing greater on left.

L5-S1 broad-based disc osteophyte complex with foraminal/ lateral
extension encroaching upon but not compressing exiting L5 nerve
roots. No significant spinal stenosis.

Small sclerotic foci T12, L2 and S[DATE] represent treated metastatic
lesions, unchanged.

## 2018-06-18 ENCOUNTER — Other Ambulatory Visit: Payer: Self-pay | Admitting: Oncology

## 2018-06-18 DIAGNOSIS — C50911 Malignant neoplasm of unspecified site of right female breast: Secondary | ICD-10-CM

## 2018-06-19 ENCOUNTER — Other Ambulatory Visit: Payer: Self-pay | Admitting: *Deleted

## 2018-06-19 DIAGNOSIS — C50919 Malignant neoplasm of unspecified site of unspecified female breast: Secondary | ICD-10-CM

## 2018-06-21 NOTE — Progress Notes (Signed)
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  INTERVAL HISTORY: Patient returns to clinic today for further evaluation and discussion of her imaging results.  She does not complain of headache today.  She currently feels well and is asymptomatic.  She denies any pain.  She has no neurologic complaints.  She denies any recent fevers or illnesses.  She denies any chest pain or shortness of breath. She has a fair appetite and denies weight loss. She denies nausea, vomiting, or constipation.  She admits to occasional diarrhea. She has no melena or hematochezia.  She has no urinary complaints.  Patient offers no further specific complaints today.  REVIEW OF SYSTEMS:   Review of Systems  Constitutional: Negative for fever, malaise/fatigue and weight loss.  Respiratory: Negative.  Negative for cough and shortness of breath.   Cardiovascular: Negative.  Negative for chest pain and leg swelling.  Gastrointestinal: Positive for diarrhea. Negative for abdominal pain, blood in stool and melena.  Genitourinary: Negative.   Musculoskeletal: Negative.  Negative for back pain and joint pain.  Skin: Negative.  Negative for rash.  Neurological: Negative.  Negative for sensory change, focal weakness, weakness and headaches.  Psychiatric/Behavioral: Negative.  The patient is not nervous/anxious.     As per HPI. Otherwise, a complete review of systems is negative.  PAST MEDICAL HISTORY: Past Medical History:  Diagnosis Date  . Anxiety   . Arthritis    "back" (04/03/2016)  . Breast cancer ([place])  2012    no lumpectomy. Treating with meds- Right  . Cancer ([place])    small intestines 2012, gallbladder 2016  . Chronic lower back pain   . COPD (chronic obstructive pulmonary disease) ([place])    "use inhalers for COPD that dx'd at one time" (04/03/2016)  . Family history of adverse reaction to anesthesia    daughter gets nauseated  . GERD (gastroesophageal reflux disease)   . High cholesterol   . Hypertension   . Hypothyroidism   . Skin cancer    "right neck; mid forehead"    PAST SURGICAL HISTORY: Past Surgical History:  Procedure Laterality Date  . ABDOMINAL HERNIA REPAIR     "after they took tumor out of my colon I developed a hernia"  . ABDOMINAL HYSTERECTOMY    . BACK SURGERY    . BREAST BIOPSY Right 2012   positive  . BREAST BIOPSY Right 2014   positive  . BREAST BIOPSY Right 06/26/2016   US guided - waiting for pathology  . BREAST EXCISIONAL BIOPSY Right 1995   benign  . CARPAL TUNNEL RELEASE Left   . DILATION AND CURETTAGE OF UTERUS     S/P miscarriage  . IR GENERIC HISTORICAL  04/04/2016   IR LUMBAR DISC ASPIRATION W/IMG GUIDE 04/04/2016 [name], MD [facility]  . JOINT REPLACEMENT    . LAPAROSCOPIC CHOLECYSTECTOMY  2016  . SKIN CANCER EXCISION     "right neck; mid forehead"  . TOTAL HIP ARTHROPLASTY Right 10/03/2015   Procedure: TOTAL HIP ARTHROPLASTY ANTERIOR APPROACH;  Surgeon: Hessie Knows, MD;  Location: [facility] ORS;  Service: Orthopedics;  Laterality: Right;  . TOTAL HIP ARTHROPLASTY Left 07/25/2016  Procedure: TOTAL HIP ARTHROPLASTY ANTERIOR APPROACH;  Surgeon: Hessie Knows, MD;  Location: [facility] ORS;  Service: Orthopedics;  Laterality: Left;  . TUBAL LIGATION    . TUMOR EXCISION  2012   "small intestine; came from the breast cancer"    FAMILY HISTORY: Reviewed and unchanged. No reported history of breast cancer or other chronic diseases.     ADVANCED DIRECTIVES:    HEALTH MAINTENANCE: Social History   Tobacco Use  . Smoking status: Current  Every Day Smoker    Packs/day: 1.00    Years: 54.00    Pack years: 54.00    Types: Cigarettes  . Smokeless tobacco: Never Used  . Tobacco comment: not ready to quit  Substance Use Topics  . Alcohol use: No  . Drug use: No     Colonoscopy:  PAP:  Bone density:  Lipid panel:  Allergies  Allergen Reactions  . Carafate [Sucralfate] Nausea Only  . Diclofenac Sodium Nausea Only  . Erythromycin Diarrhea and Nausea And Vomiting  . Adhesive [Tape] Itching and Rash  . Wellbutrin [Bupropion] Itching and Rash    Current Outpatient Medications  Medication Sig Dispense Refill  . acetaminophen (TYLENOL) 500 MG tablet Take 1,000 mg by mouth daily as needed for mild pain or fever.     . ALPRAZolam (XANAX) 0.5 MG tablet Take by mouth.    . cetirizine (ZYRTEC) 10 MG tablet Take 10 mg by mouth daily as needed for allergies. Reported on 10/03/2015    . citalopram (CELEXA) 20 MG tablet Take 20 mg by mouth every morning.     . cyclobenzaprine (FLEXERIL) 10 MG tablet cyclobenzaprine 10 mg tablet  TAKE 1 TABLET BY MOUTH EVERY 8 HOURS AS NEEDED    . dicyclomine (BENTYL) 10 MG capsule Take 10 mg by mouth 3 (three) times daily as needed for spasms.    . diphenoxylate-atropine (LOMOTIL) 2.5-0.025 MG tablet Take 1 tablet by mouth 4 (four) times daily as needed for diarrhea or loose stools. 30 tablet 0  . fluticasone (FLONASE) 50 MCG/ACT nasal spray fluticasone propionate 50 mcg/actuation nasal spray,suspension    . FLUTICASONE PROPIONATE EX Apply topically.    . Fluticasone-Salmeterol (ADVAIR) 100-50 MCG/DOSE AEPB Inhale 1 puff into the lungs every morning.     Leslee Home 100 MG capsule TAKE 1 CAPSULE (100 MG TOTAL) BY MOUTH DAILY. TAKE WHOLE WITH FOOD. TAKE FOR 21 DAYS ON, 7 DAYS OFF, REPEAT EVERY 28 DAYS. 21 capsule 4  . letrozole (FEMARA) 2.5 MG tablet TAKE 1 TABLET EVERY DAY 90 tablet 4  . levothyroxine (SYNTHROID, LEVOTHROID) 100 MCG tablet Take 100 mcg by mouth daily before breakfast.     .  montelukast (SINGULAIR) 10 MG tablet Take 10 mg by mouth at bedtime.     . Multiple Vitamins-Minerals (CENTRUM SILVER PO) Take 1 tablet by mouth every morning.    Marland Kitchen omeprazole (PRILOSEC) 20 MG capsule Take 20 mg by mouth every morning.     . pravastatin (PRAVACHOL) 40 MG tablet Take 40 mg by mouth at bedtime.     . traMADol (ULTRAM) 50 MG tablet Take 50 mg by mouth every 6 (six) hours as needed. for pain  0  . valsartan-hydrochlorothiazide (DIOVAN-HCT) 80-12.5 MG tablet Take 1 tablet by mouth daily.    Marland Kitchen zolpidem (AMBIEN) 10 MG tablet TAKE 1 TABLET BY MOUTH EVERY DAY AT BEDTIME AS NEEDED FOR SLEEP 30 tablet 2  . erythromycin ophthalmic ointment erythromycin 5 mg/gram (0.5 %) eye ointment  APPLY 0.25 INCH(ES) TO  EYELIDS AT NIGHT    . gabapentin (NEURONTIN) 300 MG capsule gabapentin 300 mg capsule    . HYDROcodone-acetaminophen (NORCO/VICODIN) 5-325 MG tablet hydrocodone 5 mg-acetaminophen 325 mg tablet    . meloxicam (MOBIC) 15 MG tablet meloxicam 15 mg tablet  TAKE 1 TABLET(S) EVERY DAY BY ORAL ROUTE WITH MEALS.    Marland Kitchen ondansetron (ZOFRAN ODT) 4 MG disintegrating tablet Take 1 tablet (4 mg total) by mouth every 8 (eight) hours as needed for nausea or vomiting. (Patient not taking: Reported on 05/19/2018) 20 tablet 0  . polyethylene glycol (MIRALAX / GLYCOLAX) packet Take 17 g by mouth daily. (Patient not taking: Reported on 05/19/2018) 14 each 0  . prochlorperazine (COMPAZINE) 10 MG tablet Take 1 tablet (10 mg total) by mouth every 6 (six) hours as needed for nausea or vomiting. (Patient not taking: Reported on 05/19/2018) 30 tablet 2  . triamcinolone cream (KENALOG) 0.1 % APPLY TO AFFECTED AREA TWICE A DAY UNTIL CLEAR     No current facility-administered medications for this visit.     OBJECTIVE: Vitals:   06/25/18 1015  BP: 132/80  Pulse: 74  Resp: 18  Temp: (!) 97.2 F (36.2 C)     Body mass index is 24.75 kg/m.    ECOG FS:0 - Asymptomatic  General: Well-developed, well-nourished, no  acute distress. Eyes: Pink conjunctiva, anicteric sclera. HEENT: Normocephalic, moist mucous membranes. Breast: Exam deferred today. Lungs: Clear to auscultation bilaterally. Heart: Regular rate and rhythm. No rubs, murmurs, or gallops. Abdomen: Soft, nontender, nondistended. No organomegaly noted, normoactive bowel sounds. Musculoskeletal: No edema, cyanosis, or clubbing. Neuro: Alert, answering all questions appropriately. Cranial nerves grossly intact. Skin: No rashes or petechiae noted. Psych: Normal affect.  LAB RESULTS: Lab Results  Component Value Date   NA 137 06/22/2018   K 4.7 06/22/2018   CL 103 06/22/2018   CO2 27 06/22/2018   GLUCOSE 100 (H) 06/22/2018   BUN 32 (H) 06/22/2018   CREATININE 1.33 (H) 06/22/2018   CALCIUM 9.4 06/22/2018   PROT 7.2 06/22/2018   ALBUMIN 4.1 06/22/2018   AST 21 06/22/2018   ALT 15 06/22/2018   ALKPHOS 58 06/22/2018   BILITOT 0.6 06/22/2018   GFRNONAA 39 (L) 06/22/2018   GFRAA 45 (L) 06/22/2018    Lab Results  Component Value Date   WBC 2.1 (L) 06/22/2018   NEUTROABS 1.4 (L) 06/22/2018   HGB 10.8 (L) 06/22/2018   HCT 32.2 (L) 06/22/2018   MCV 107.7 (H) 06/22/2018   PLT 112 (L) 06/22/2018    STUDIES: Mr Jeri Cos ZO Contrast  Result Date: 06/22/2018 CLINICAL DATA:  Metastatic breast cancer.  Headache EXAM: MRI HEAD WITHOUT AND WITH CONTRAST TECHNIQUE: Multiplanar, multiecho pulse sequences of the brain and surrounding structures were obtained without and with intravenous contrast. CONTRAST:  6.5 mL Gadovist IV COMPARISON:  MRI head 03/02/2011 FINDINGS: Brain: Ventricle size and cerebral volume normal. Scattered small white matter hyperintensities bilaterally similar to the prior MRI. No acute infarct, hemorrhage, mass. No enhancing lesions. No brain edema. Vascular: Normal arterial flow voids Skull and upper cervical spine: Negative Sinuses/Orbits: Negative Other: None IMPRESSION: No acute abnormality and negative for metastatic  disease. Electronically Signed   By: Franchot Gallo M.D.   On: 06/22/2018 13:00   Nm Pet Image Restag (ps) Skull Base To Thigh  Result Date: 06/22/2018 CLINICAL DATA:  Subsequent treatment strategy for metastatic breast cancer. EXAM: NUCLEAR MEDICINE PET SKULL BASE TO THIGH TECHNIQUE: 7.69 mCi F-18 FDG was injected intravenously. Full-ring PET  imaging was performed from the skull base to thigh after the radiotracer. CT data was obtained and used for attenuation correction and anatomic localization. Fasting blood glucose: 95 mg/dl COMPARISON:  PET-CT 04/29/2017 and CT scan abdomen/pelvis 12/30/2017 FINDINGS: Mediastinal blood pool activity: SUV max 2.18 NECK: No hypermetabolic lymph nodes in the neck. Incidental CT findings: Advanced atherosclerotic calcifications involving the carotid arteries. CHEST: No breast masses, supraclavicular or axillary adenopathy. Surgical changes involving the right breast. No enlarged or hypermetabolic mediastinal or hilar lymph nodes and no worrisome pulmonary nodules to suggest metastatic disease. Incidental CT findings: none ABDOMEN/PELVIS: No abnormal hypermetabolic activity within the liver, pancreas, adrenal glands, or spleen. No hypermetabolic lymph nodes in the abdomen or pelvis. Incidental CT findings: none SKELETON: Stable scattered sclerotic bone metastasis. The lytic sacral lesion is again demonstrated and appears slightly larger. It measures 5.1 x 3.5 cm and previously measured 4.9 x 3.1 cm on the CT scan from May 2019 and 4.4 x 2.3 cm on the prior PET-CT. It is more hypermetabolic on today's exam with SUV max of 10.35. It was previously 5.0. No new lytic destructive bone lesions are identified. Incidental CT findings: none IMPRESSION: 1. Interval slight enlargement and greater hypermetabolism in the destructive sacral lesion. No new hypermetabolic bone lesions. Overall stable sclerotic metastatic bone disease. 2. No findings for recurrent right chest wall or axillary  tumor and no evidence of pulmonary or abdominal/pelvic metastasis. Electronically Signed   By: Marijo Sanes M.D.   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  PLAN:    1.  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  Patient's only obvious site of metastatic disease is in the sacral lesion which is only mildly progressed from previous.  Patient is also asymptomatic.  MRI of the brain is negative.  We discussed the possibility of doing XRT to the sacral lesion, but being that she is asymptomatic we can defer this to at a later time.  Patient's CA 27-29 continues to be relatively stable since June 2019 ranging from 33.3-40.3. Given patient's pancytopenia, continue with dose reduced Ibrance to 100 mg every 21 days with 7 days off along with daily letrozole.  Return to clinic in 6 weeks for laboratory work only and in 3 months for laboratory work and further evaluation. 2.  Hip/back pain: Significantly improved. Patient is status post bilateral hip replacement. 3.  Anemia: Patient's most recent hemoglobin is 10.8, monitor. 4.  Renal insufficiency: Patient's creatinine of 1.33 appears to be approximately her baseline.  Monitor.   5.  Leukopenia: Decreased secondary to Ibrance.  Continue with dose reduced treatment as above. 6.  Thrombocytopenia: Slightly trended down, monitor. 7.  Diarrhea: Patient does not complain of this today.  Continue OTC Imodium as needed. 8.  Sacral lesion: Can consider XRT in the future if becomes symptomatic.  We also discussed the possibility of Zometa, but with only 1 isolated lesion patient wished to defer this at this time as well.  Patient expressed understanding and was in agreement with this  plan. She also understands that She can call clinic at any time with any questions, concerns, or complaints.    Lloyd Huger, MD   06/26/2018 10:00 AM

## 2018-06-22 ENCOUNTER — Inpatient Hospital Stay: Payer: Medicare Other | Attending: Oncology

## 2018-06-22 ENCOUNTER — Encounter
Admission: RE | Admit: 2018-06-22 | Discharge: 2018-06-22 | Disposition: A | Discharge status: Home / Self Care | Payer: Medicare Other | Source: Ambulatory Visit | Attending: Oncology | Admitting: Oncology

## 2018-06-22 ENCOUNTER — Ambulatory Visit
Admission: RE | Admit: 2018-06-22 | Discharge: 2018-06-22 | Disposition: A | Discharge status: Home / Self Care | Payer: Medicare Other | Source: Ambulatory Visit | Attending: Oncology | Admitting: Oncology

## 2018-06-22 DIAGNOSIS — C50911 Malignant neoplasm of unspecified site of right female breast: Secondary | ICD-10-CM

## 2018-06-22 DIAGNOSIS — D696 Thrombocytopenia, unspecified: Secondary | ICD-10-CM | POA: Insufficient documentation

## 2018-06-22 DIAGNOSIS — Z96643 Presence of artificial hip joint, bilateral: Secondary | ICD-10-CM | POA: Diagnosis not present

## 2018-06-22 DIAGNOSIS — M899 Disorder of bone, unspecified: Secondary | ICD-10-CM | POA: Insufficient documentation

## 2018-06-22 DIAGNOSIS — C50411 Malignant neoplasm of upper-outer quadrant of right female breast: Secondary | ICD-10-CM | POA: Insufficient documentation

## 2018-06-22 DIAGNOSIS — C7989 Secondary malignant neoplasm of other specified sites: Secondary | ICD-10-CM | POA: Insufficient documentation

## 2018-06-22 DIAGNOSIS — D61818 Other pancytopenia: Secondary | ICD-10-CM | POA: Diagnosis not present

## 2018-06-22 DIAGNOSIS — C784 Secondary malignant neoplasm of small intestine: Secondary | ICD-10-CM | POA: Insufficient documentation

## 2018-06-22 DIAGNOSIS — D649 Anemia, unspecified: Secondary | ICD-10-CM | POA: Diagnosis not present

## 2018-06-22 DIAGNOSIS — Z79899 Other long term (current) drug therapy: Secondary | ICD-10-CM | POA: Diagnosis not present

## 2018-06-22 DIAGNOSIS — R51 Headache: Secondary | ICD-10-CM | POA: Insufficient documentation

## 2018-06-22 DIAGNOSIS — C50919 Malignant neoplasm of unspecified site of unspecified female breast: Secondary | ICD-10-CM

## 2018-06-22 DIAGNOSIS — C7951 Secondary malignant neoplasm of bone: Secondary | ICD-10-CM | POA: Insufficient documentation

## 2018-06-22 DIAGNOSIS — N289 Disorder of kidney and ureter, unspecified: Secondary | ICD-10-CM | POA: Diagnosis not present

## 2018-06-22 LAB — CBC WITH DIFFERENTIAL/PLATELET
ABS IMMATURE GRANULOCYTES: 0.01 10*3/uL (ref 0.00–0.07)
BASOS ABS: 0 10*3/uL (ref 0.0–0.1)
Basophils Relative: 2 %
EOS PCT: 2 %
Eosinophils Absolute: 0 10*3/uL (ref 0.0–0.5)
HCT: 32.2 % — ABNORMAL LOW (ref 36.0–46.0)
Hemoglobin: 10.8 g/dL — ABNORMAL LOW (ref 12.0–15.0)
IMMATURE GRANULOCYTES: 1 %
LYMPHS ABS: 0.5 10*3/uL — AB (ref 0.7–4.0)
LYMPHS PCT: 21 %
MCH: 36.1 pg — ABNORMAL HIGH (ref 26.0–34.0)
MCHC: 33.5 g/dL (ref 30.0–36.0)
MCV: 107.7 fL — AB (ref 80.0–100.0)
Monocytes Absolute: 0.2 10*3/uL (ref 0.1–1.0)
Monocytes Relative: 9 %
NEUTROS ABS: 1.4 10*3/uL — AB (ref 1.7–7.7)
NRBC: 0 % (ref 0.0–0.2)
Neutrophils Relative %: 65 %
PLATELETS: 112 10*3/uL — AB (ref 150–400)
RBC: 2.99 MIL/uL — ABNORMAL LOW (ref 3.87–5.11)
RDW: 12.7 % (ref 11.5–15.5)
SMEAR REVIEW: DECREASED
WBC: 2.1 10*3/uL — ABNORMAL LOW (ref 4.0–10.5)

## 2018-06-22 LAB — COMPREHENSIVE METABOLIC PANEL
ALT: 15 U/L (ref 0–44)
AST: 21 U/L (ref 15–41)
Albumin: 4.1 g/dL (ref 3.5–5.0)
Alkaline Phosphatase: 58 U/L (ref 38–126)
Anion gap: 7 (ref 5–15)
BUN: 32 mg/dL — ABNORMAL HIGH (ref 8–23)
CHLORIDE: 103 mmol/L (ref 98–111)
CO2: 27 mmol/L (ref 22–32)
CREATININE: 1.33 mg/dL — AB (ref 0.44–1.00)
Calcium: 9.4 mg/dL (ref 8.9–10.3)
GFR calc non Af Amer: 39 mL/min — ABNORMAL LOW (ref >60)
GFR, EST AFRICAN AMERICAN: 45 mL/min — AB (ref >60)
Glucose, Bld: 100 mg/dL — ABNORMAL HIGH (ref 70–99)
POTASSIUM: 4.7 mmol/L (ref 3.5–5.1)
SODIUM: 137 mmol/L (ref 135–145)
Total Bilirubin: 0.6 mg/dL (ref 0.3–1.2)
Total Protein: 7.2 g/dL (ref 6.5–8.1)

## 2018-06-22 LAB — GLUCOSE, CAPILLARY: GLUCOSE-CAPILLARY: 95 mg/dL (ref 70–99)

## 2018-06-22 MED ORDER — FLUDEOXYGLUCOSE F - 18 (FDG) INJECTION
7.5000 | Freq: Once | INTRAVENOUS | Status: AC | PRN
  Administered 2018-06-22: 7.69 via INTRAVENOUS

## 2018-06-22 MED ORDER — GADOBUTROL 1 MMOL/ML IV SOLN
7.5000 mL | Freq: Once | INTRAVENOUS | Status: AC | PRN
  Administered 2018-06-22: 7.5 mL via INTRAVENOUS

## 2018-06-23 LAB — CANCER ANTIGEN 27.29: CAN 27.29: 41.5 U/mL — AB (ref 0.0–38.6)

## 2018-06-24 MED FILL — IBRANCE 100 MG CAPSULE: 100 | 28 days supply | Qty: 21 | Fill #0

## 2018-06-25 ENCOUNTER — Other Ambulatory Visit: Payer: Self-pay

## 2018-06-25 ENCOUNTER — Inpatient Hospital Stay (HOSPITAL_BASED_OUTPATIENT_CLINIC_OR_DEPARTMENT_OTHER): Payer: Medicare Other | Admitting: Oncology

## 2018-06-25 ENCOUNTER — Encounter: Payer: Self-pay | Admitting: Oncology

## 2018-06-25 VITALS — BP 132/80 | HR 74 | Temp 97.2°F | Resp 18 | Wt 144.2 lb

## 2018-06-25 DIAGNOSIS — R197 Diarrhea, unspecified: Secondary | ICD-10-CM

## 2018-06-25 DIAGNOSIS — D696 Thrombocytopenia, unspecified: Secondary | ICD-10-CM

## 2018-06-25 DIAGNOSIS — M899 Disorder of bone, unspecified: Secondary | ICD-10-CM

## 2018-06-25 DIAGNOSIS — N289 Disorder of kidney and ureter, unspecified: Secondary | ICD-10-CM

## 2018-06-25 DIAGNOSIS — C50411 Malignant neoplasm of upper-outer quadrant of right female breast: Secondary | ICD-10-CM | POA: Diagnosis not present

## 2018-06-25 DIAGNOSIS — D649 Anemia, unspecified: Secondary | ICD-10-CM

## 2018-06-25 DIAGNOSIS — Z96643 Presence of artificial hip joint, bilateral: Secondary | ICD-10-CM

## 2018-06-25 DIAGNOSIS — C7989 Secondary malignant neoplasm of other specified sites: Secondary | ICD-10-CM

## 2018-06-25 DIAGNOSIS — C7951 Secondary malignant neoplasm of bone: Secondary | ICD-10-CM

## 2018-06-25 DIAGNOSIS — D61818 Other pancytopenia: Secondary | ICD-10-CM

## 2018-06-25 DIAGNOSIS — C50911 Malignant neoplasm of unspecified site of right female breast: Secondary | ICD-10-CM

## 2018-06-25 DIAGNOSIS — D72819 Decreased white blood cell count, unspecified: Secondary | ICD-10-CM

## 2018-06-25 DIAGNOSIS — Z720 Tobacco use: Secondary | ICD-10-CM

## 2018-06-25 DIAGNOSIS — C784 Secondary malignant neoplasm of small intestine: Secondary | ICD-10-CM | POA: Diagnosis not present

## 2018-06-25 NOTE — Progress Notes (Signed)
Pt here for PET and MRI results.

## 2018-07-08 ENCOUNTER — Other Ambulatory Visit: Payer: Self-pay | Admitting: Nephrology

## 2018-07-08 DIAGNOSIS — N183 Chronic kidney disease, stage 3 unspecified: Secondary | ICD-10-CM

## 2018-07-12 ENCOUNTER — Other Ambulatory Visit: Payer: Self-pay | Admitting: Oncology

## 2018-07-12 DIAGNOSIS — C50911 Malignant neoplasm of unspecified site of right female breast: Secondary | ICD-10-CM

## 2018-07-12 DIAGNOSIS — C801 Malignant (primary) neoplasm, unspecified: Secondary | ICD-10-CM

## 2018-07-13 ENCOUNTER — Telehealth: Payer: Self-pay | Admitting: Pharmacist

## 2018-07-13 NOTE — Telephone Encounter (Signed)
Oral Chemotherapy Pharmacist Encounter  Follow-Up Form  Called patient today to follow up regarding patient's oral chemotherapy medication: Ibrance (palbociclib)  Original Start date of oral chemotherapy: 12/2016  Pt reports 0 tablets/doses of Ibrance missed so far this month.   Pt reports the following side effects: None reported  Recent labs reviewed: CA 27.29 from 06/22/18  New medications?: None reported  Other Issues: None reported  Patient knows to call the office with questions or concerns. Oral Oncology Clinic will continue to follow.  Darl Pikes, PharmD, BCPS, [facility] Hematology/Oncology Clinical Pharmacist [facility] [phone]  07/13/2018 1:54 PM

## 2018-07-14 ENCOUNTER — Ambulatory Visit
Admission: RE | Admit: 2018-07-14 | Discharge: 2018-07-14 | Disposition: A | Discharge status: Home / Self Care | Payer: Medicare Other | Source: Ambulatory Visit | Attending: Nephrology | Admitting: Nephrology

## 2018-07-14 DIAGNOSIS — N281 Cyst of kidney, acquired: Secondary | ICD-10-CM | POA: Insufficient documentation

## 2018-07-14 DIAGNOSIS — N183 Chronic kidney disease, stage 3 unspecified: Secondary | ICD-10-CM

## 2018-07-22 MED FILL — IBRANCE 100 MG CAPSULE: 100 | 28 days supply | Qty: 21 | Fill #1

## 2018-07-25 ENCOUNTER — Emergency Department: Payer: Medicare Other

## 2018-07-25 ENCOUNTER — Encounter: Payer: Self-pay | Admitting: Emergency Medicine

## 2018-07-25 ENCOUNTER — Other Ambulatory Visit: Payer: Self-pay

## 2018-07-25 ENCOUNTER — Emergency Department
Admission: EM | Admit: 2018-07-25 | Discharge: 2018-07-25 | Disposition: A | Discharge status: Home / Self Care | Payer: Medicare Other | Attending: Emergency Medicine | Admitting: Emergency Medicine

## 2018-07-25 DIAGNOSIS — R1084 Generalized abdominal pain: Secondary | ICD-10-CM

## 2018-07-25 DIAGNOSIS — F17210 Nicotine dependence, cigarettes, uncomplicated: Secondary | ICD-10-CM | POA: Diagnosis not present

## 2018-07-25 DIAGNOSIS — R1012 Left upper quadrant pain: Secondary | ICD-10-CM | POA: Diagnosis present

## 2018-07-25 LAB — URINALYSIS, COMPLETE (UACMP) WITH MICROSCOPIC
Bacteria, UA: NONE SEEN
Bilirubin Urine: NEGATIVE
GLUCOSE, UA: NEGATIVE mg/dL
HGB URINE DIPSTICK: NEGATIVE
KETONES UR: NEGATIVE mg/dL
LEUKOCYTES UA: NEGATIVE
NITRITE: NEGATIVE
PROTEIN: NEGATIVE mg/dL
SQUAMOUS EPITHELIAL / LPF: NONE SEEN (ref 0–5)
Specific Gravity, Urine: 1.005 (ref 1.005–1.030)
pH: 6 (ref 5.0–8.0)

## 2018-07-25 LAB — CBC
HCT: 33 % — ABNORMAL LOW (ref 36.0–46.0)
HEMOGLOBIN: 11.2 g/dL — AB (ref 12.0–15.0)
MCH: 36.4 pg — ABNORMAL HIGH (ref 26.0–34.0)
MCHC: 33.9 g/dL (ref 30.0–36.0)
MCV: 107.1 fL — AB (ref 80.0–100.0)
Platelets: 120 10*3/uL — ABNORMAL LOW (ref 150–400)
RBC: 3.08 MIL/uL — ABNORMAL LOW (ref 3.87–5.11)
RDW: 13.3 % (ref 11.5–15.5)
WBC: 2.9 10*3/uL — AB (ref 4.0–10.5)
nRBC: 0 % (ref 0.0–0.2)

## 2018-07-25 LAB — COMPREHENSIVE METABOLIC PANEL
ALBUMIN: 4.1 g/dL (ref 3.5–5.0)
ALT: 16 U/L (ref 0–44)
AST: 25 U/L (ref 15–41)
Alkaline Phosphatase: 76 U/L (ref 38–126)
Anion gap: 8 (ref 5–15)
BUN: 26 mg/dL — AB (ref 8–23)
CHLORIDE: 101 mmol/L (ref 98–111)
CO2: 31 mmol/L (ref 22–32)
Calcium: 9.2 mg/dL (ref 8.9–10.3)
Creatinine, Ser: 1.18 mg/dL — ABNORMAL HIGH (ref 0.44–1.00)
GFR calc Af Amer: 53 mL/min — ABNORMAL LOW (ref >60)
GFR, EST NON AFRICAN AMERICAN: 46 mL/min — AB (ref >60)
GLUCOSE: 128 mg/dL — AB (ref 70–99)
POTASSIUM: 3.6 mmol/L (ref 3.5–5.1)
Sodium: 140 mmol/L (ref 135–145)
Total Bilirubin: 0.5 mg/dL (ref 0.3–1.2)
Total Protein: 7.1 g/dL (ref 6.5–8.1)

## 2018-07-25 LAB — LIPASE, BLOOD: LIPASE: 24 U/L (ref 11–51)

## 2018-07-25 MED ORDER — OXYCODONE-ACETAMINOPHEN 5-325 MG PO TABS: 1.0000 | ORAL_TABLET | Freq: Two times a day (BID) | ORAL | 0 refills | Status: DC | PRN

## 2018-07-25 MED ORDER — MORPHINE SULFATE (PF) 2 MG/ML IV SOLN
2.0000 mg | Freq: Once | INTRAVENOUS | Status: AC
  Administered 2018-07-25: 2 mg via INTRAVENOUS
  Filled 2018-07-25: qty 1

## 2018-07-25 MED ORDER — IOHEXOL 300 MG/ML  SOLN
75.0000 mL | Freq: Once | INTRAMUSCULAR | Status: AC | PRN
  Administered 2018-07-25: 75 mL via INTRAVENOUS

## 2018-07-25 NOTE — ED Provider Notes (Signed)
[facility] Emergency Department Provider Note       Time seen: ----------------------------------------- 6:09 PM on 07/25/2018 -----------------------------------------   I have reviewed the triage vital signs and the nursing notes.  HISTORY   Chief Complaint Abdominal Pain   HPI Sara David is a 72 y.o. female with a history of anxiety, arthritis, breast cancer, COPD, GERD, hypertension, hypothyroidism who presents to the ED for left upper quadrant pain and diarrhea since Thursday night.  Patient reports today the pain is worse.  Patient has not had any diarrhea today, last diarrhea was last night.  She had severe pain across her abdomen which is unusual for her.  She has had occasional right-sided abdominal pain but nothing like what she felt today.  She denies fevers or vomiting.  Past Medical History:  Diagnosis Date  . Anxiety   . Arthritis    "back" (04/03/2016)  . Breast cancer ([place]) 2012    no lumpectomy. Treating with meds- Right  . Cancer ([place])    small intestines 2012, gallbladder 2016  . Chronic lower back pain   . COPD (chronic obstructive pulmonary disease) ([place])    "use inhalers for COPD that dx'd at one time" (04/03/2016)  . Family history of adverse reaction to anesthesia    daughter gets nauseated  . GERD (gastroesophageal reflux disease)   . High cholesterol   . Hypertension   . Hypothyroidism   . Skin cancer    "right neck; mid forehead"    Patient Active Problem List   Diagnosis Date Noted  . Malignant neoplasm of overlapping sites of right breast in female, estrogen receptor positive ([place]) 05/19/2018  . Radial styloid tenosynovitis 05/19/2018  . Metastasis from malignant tumor of breast ([place]) 04/14/2017  . Primary localized osteoarthritis of left hip 07/25/2016  . Left hip pain 06/12/2016  . Acute low back pain 04/02/2016  . Chronic osteoarthritis 04/01/2016  . Asthma without status asthmaticus 10/17/2015  . Malignant  neoplasm of upper-outer quadrant of right female breast ([place]) 10/17/2015  . Carpal tunnel syndrome 10/17/2015  . Cervical disc disease 10/17/2015  . Neck pain 10/17/2015  . Benign essential hypertension 10/17/2015  . Acid reflux 10/17/2015  . Hyperlipemia, mixed 10/17/2015  . Adult hypothyroidism 10/17/2015  . Adaptive colitis 10/17/2015  . Primary osteoarthritis of right hip 05/11/2015  . Pain in wrist 02/13/2015  . Small bowel obstruction ([place]) 01/30/2015  . B12 deficiency 01/30/2015  . GERD (gastroesophageal reflux disease) 01/30/2015  . Irritable bowel syndrome 01/30/2015  . Asthma 01/30/2015  . Hypothyroidism 01/30/2015  . FH: cholecystectomy 01/30/2015  . Breast cancer metastasized to multiple sites ([place]) 01/30/2015  . Fibrocystic breast disease 05/30/2014  . Post menopausal syndrome 05/30/2014  . Raynaud's syndrome without gangrene 05/30/2014  . Postmenopausal 05/30/2014  . Raynaud's phenomenon 05/30/2014  . Chronic bronchitis ([place]) 01/26/2014    Past Surgical History:  Procedure Laterality Date  . ABDOMINAL HERNIA REPAIR     "after they took tumor out of my colon I developed a hernia"  . ABDOMINAL HYSTERECTOMY    . BACK SURGERY    . BREAST BIOPSY Right 2012   positive  . BREAST BIOPSY Right 2014   positive  . BREAST BIOPSY Right 06/26/2016   US guided - waiting for pathology  . BREAST EXCISIONAL BIOPSY Right 1995   benign  . CARPAL TUNNEL RELEASE Left   . DILATION AND CURETTAGE OF UTERUS     S/P miscarriage  . IR GENERIC HISTORICAL  04/04/2016  IR LUMBAR DISC ASPIRATION W/IMG GUIDE 04/04/2016 [name], MD [facility]  . JOINT REPLACEMENT    . LAPAROSCOPIC CHOLECYSTECTOMY  2016  . SKIN CANCER EXCISION     "right neck; mid forehead"  . TOTAL HIP ARTHROPLASTY Right 10/03/2015   Procedure: TOTAL HIP ARTHROPLASTY ANTERIOR APPROACH;  Surgeon: Hessie Knows, MD;  Location: [facility] ORS;  Service: Orthopedics;  Laterality: Right;  . TOTAL HIP ARTHROPLASTY Left  07/25/2016   Procedure: TOTAL HIP ARTHROPLASTY ANTERIOR APPROACH;  Surgeon: Hessie Knows, MD;  Location: [facility] ORS;  Service: Orthopedics;  Laterality: Left;  . TUBAL LIGATION    . TUMOR EXCISION  2012   "small intestine; came from the breast cancer"    Allergies Carafate [sucralfate]; Diclofenac sodium; Erythromycin; Adhesive [tape]; and Wellbutrin [bupropion]  Social History Social History   Tobacco Use  . Smoking status: Current Every Day Smoker    Packs/day: 1.00    Years: 54.00    Pack years: 54.00    Types: Cigarettes  . Smokeless tobacco: Never Used  . Tobacco comment: not ready to quit  Substance Use Topics  . Alcohol use: No  . Drug use: No   Review of Systems Constitutional: Negative for fever. Cardiovascular: Negative for chest pain. Respiratory: Negative for shortness of breath. Gastrointestinal: Positive for abdominal pain, recent diarrhea Musculoskeletal: Negative for back pain. Skin: Negative for rash. Neurological: Negative for headaches, focal weakness or numbness.  All systems negative/normal/unremarkable except as stated in the HPI  ____________________________________________   PHYSICAL EXAM:  VITAL SIGNS: ED Triage Vitals  Enc Vitals Group     BP 07/25/18 1504 126/63     Pulse Rate 07/25/18 1504 75     Resp 07/25/18 1504 18     Temp 07/25/18 1504 98.5 F (36.9 C)     Temp Source 07/25/18 1504 Oral     SpO2 07/25/18 1504 96 %     Weight 07/25/18 1505 144 lb 2.9 oz (65.4 kg)     Height --      Head Circumference --      Peak Flow --      Pain Score 07/25/18 1505 6     Pain Loc --      Pain Edu? --      Excl. in [place]? --    Constitutional: Alert and oriented. Well appearing and in no distress. Eyes: Conjunctivae are normal. Normal extraocular movements. ENT   Head: Normocephalic and atraumatic.   Nose: No congestion/rhinnorhea.   Mouth/Throat: Mucous membranes are moist.   Neck: No stridor. Cardiovascular: Normal rate,  regular rhythm. No murmurs, rubs, or gallops. Respiratory: Normal respiratory effort without tachypnea nor retractions. Breath sounds are clear and equal bilaterally. No wheezes/rales/rhonchi. Gastrointestinal: Soft and nontender. Normal bowel sounds Musculoskeletal: Nontender with normal range of motion in extremities. No lower extremity tenderness nor edema. Neurologic:  Normal speech and language. No gross focal neurologic deficits are appreciated.  Skin:  Skin is warm, dry and intact. No rash noted. Psychiatric: Mood and affect are normal. Speech and behavior are normal.  ____________________________________________  EKG: Interpreted by me.  Sinus rhythm rate 64 bpm, normal PR interval, normal QRS, normal QT  ____________________________________________  ED COURSE:  As part of my medical decision making, I reviewed the following data within the [place] History obtained from family if available, nursing notes, old chart and ekg, as well as notes from prior ED visits. Patient presented for abdominal pain with recent diarrhea, we will assess with labs and  imaging as indicated at this time.   Procedures ____________________________________________   LABS (pertinent positives/negatives)  Labs Reviewed  COMPREHENSIVE METABOLIC PANEL - Abnormal; Notable for the following components:      Result Value   Glucose, Bld 128 (*)    BUN 26 (*)    Creatinine, Ser 1.18 (*)    GFR calc non Af Amer 46 (*)    GFR calc Af Amer 53 (*)    All other components within normal limits  CBC - Abnormal; Notable for the following components:   WBC 2.9 (*)    RBC 3.08 (*)    Hemoglobin 11.2 (*)    HCT 33.0 (*)    MCV 107.1 (*)    MCH 36.4 (*)    Platelets 120 (*)    All other components within normal limits  URINALYSIS, COMPLETE (UACMP) WITH MICROSCOPIC - Abnormal; Notable for the following components:   Color, Urine COLORLESS (*)    APPearance CLEAR (*)    All other components  within normal limits  LIPASE, BLOOD    RADIOLOGY Images were viewed by me  CT of the abdomen pelvis with contrast IMPRESSION: Altered perfusion along the medial upper kidneys bilaterally. Given symmetry, this may reflect post radiation changes if the patient has had prior spinal radiation at this level. If there is no history of radiation, correlate for pyelonephritis.  Otherwise, no findings to account for the patient's acute left upper quadrant abdominal pain.  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 Scattered stable sclerotic lesions in the visualized axial and appendicular skeleton.  No evidence of new/progressive metastatic disease.  Additional stable ancillary findings as above. ____________________________________________  DIFFERENTIAL DIAGNOSIS   Dehydration, electrolyte abnormality, scar tissue, bowel obstruction, colitis  FINAL ASSESSMENT AND PLAN  Abdominal pain   Plan: The patient had presented for recent diarrhea with abdominal pain. Patient's labs were stable. Patient's imaging was unremarkable, no specific etiology was discovered for her pain.  She is cleared for outpatient follow-up.   Laurence Aly, MD   Note: This note was generated in part or whole with voice recognition software. Voice recognition is usually quite accurate but there are transcription errors that can and very often do occur. I apologize for any typographical errors that were not detected and corrected.     Earleen [place], MD 07/25/18 2008

## 2018-07-25 NOTE — ED Triage Notes (Signed)
Pt to ed with c/o left upper quad abd pain and diarrhea since Thursday night.  Pt reports today pain worse.

## 2018-07-31 ENCOUNTER — Telehealth: Payer: Self-pay | Admitting: Pharmacy Technician

## 2018-07-31 NOTE — Telephone Encounter (Signed)
Oral Oncology Patient Advocate Encounter  Was successful in securing patient a $15000 grant from [employer] to provide copayment coverage for [place].  This will keep the out of pocket expense at $0.     [employer] ID: [id number]  I have spoken with the patient..     The billing information is as follows and has been shared with [place].    RxBin: [id number] PCN: [id number] Member ID: [id number] Group ID: [id number] Dates of Eligibility: 07/22/18 through 07/22/19  [place] [facility] Phone [phone] Fax [phone] 07/31/2018 11:52 AM

## 2018-08-06 ENCOUNTER — Inpatient Hospital Stay: Payer: Medicare Other | Attending: Oncology

## 2018-08-06 ENCOUNTER — Other Ambulatory Visit: Payer: Self-pay

## 2018-08-06 DIAGNOSIS — C7989 Secondary malignant neoplasm of other specified sites: Secondary | ICD-10-CM | POA: Insufficient documentation

## 2018-08-06 DIAGNOSIS — C7951 Secondary malignant neoplasm of bone: Secondary | ICD-10-CM | POA: Diagnosis not present

## 2018-08-06 DIAGNOSIS — C784 Secondary malignant neoplasm of small intestine: Secondary | ICD-10-CM | POA: Insufficient documentation

## 2018-08-06 DIAGNOSIS — C50919 Malignant neoplasm of unspecified site of unspecified female breast: Secondary | ICD-10-CM

## 2018-08-06 DIAGNOSIS — C50411 Malignant neoplasm of upper-outer quadrant of right female breast: Secondary | ICD-10-CM | POA: Diagnosis present

## 2018-08-06 LAB — COMPREHENSIVE METABOLIC PANEL
ALT: 14 U/L (ref 0–44)
AST: 22 U/L (ref 15–41)
Albumin: 4.2 g/dL (ref 3.5–5.0)
Alkaline Phosphatase: 70 U/L (ref 38–126)
Anion gap: 8 (ref 5–15)
BILIRUBIN TOTAL: 0.6 mg/dL (ref 0.3–1.2)
BUN: 31 mg/dL — AB (ref 8–23)
CHLORIDE: 104 mmol/L (ref 98–111)
CO2: 28 mmol/L (ref 22–32)
CREATININE: 1.64 mg/dL — AB (ref 0.44–1.00)
Calcium: 9.3 mg/dL (ref 8.9–10.3)
GFR calc Af Amer: 36 mL/min — ABNORMAL LOW (ref >60)
GFR calc non Af Amer: 31 mL/min — ABNORMAL LOW (ref >60)
Glucose, Bld: 122 mg/dL — ABNORMAL HIGH (ref 70–99)
Potassium: 4.2 mmol/L (ref 3.5–5.1)
Sodium: 140 mmol/L (ref 135–145)
Total Protein: 7 g/dL (ref 6.5–8.1)

## 2018-08-06 LAB — CBC WITH DIFFERENTIAL/PLATELET
Abs Immature Granulocytes: 0.02 10*3/uL (ref 0.00–0.07)
Basophils Absolute: 0.1 10*3/uL (ref 0.0–0.1)
Basophils Relative: 2 %
EOS PCT: 1 %
Eosinophils Absolute: 0 10*3/uL (ref 0.0–0.5)
HEMATOCRIT: 32 % — AB (ref 36.0–46.0)
HEMOGLOBIN: 10.8 g/dL — AB (ref 12.0–15.0)
Immature Granulocytes: 1 %
LYMPHS PCT: 23 %
Lymphs Abs: 0.6 10*3/uL — ABNORMAL LOW (ref 0.7–4.0)
MCH: 36.1 pg — AB (ref 26.0–34.0)
MCHC: 33.8 g/dL (ref 30.0–36.0)
MCV: 107 fL — AB (ref 80.0–100.0)
MONO ABS: 0.2 10*3/uL (ref 0.1–1.0)
MONOS PCT: 7 %
Neutro Abs: 1.6 10*3/uL — ABNORMAL LOW (ref 1.7–7.7)
Neutrophils Relative %: 66 %
Platelets: 197 10*3/uL (ref 150–400)
RBC: 2.99 MIL/uL — AB (ref 3.87–5.11)
RDW: 13 % (ref 11.5–15.5)
WBC: 2.5 10*3/uL — ABNORMAL LOW (ref 4.0–10.5)
nRBC: 0 % (ref 0.0–0.2)

## 2018-08-07 LAB — CANCER ANTIGEN 27.29: CA 27.29: 43.1 U/mL — ABNORMAL HIGH (ref 0.0–38.6)

## 2018-08-20 MED FILL — IBRANCE 100 MG CAPSULE: 100 | 28 days supply | Qty: 21 | Fill #2

## 2018-08-27 ENCOUNTER — Ambulatory Visit (INDEPENDENT_AMBULATORY_CARE_PROVIDER_SITE_OTHER): Payer: Medicare Other | Admitting: Urology

## 2018-08-27 ENCOUNTER — Encounter: Payer: Self-pay | Admitting: Urology

## 2018-08-27 VITALS — BP 130/77 | HR 71 | Ht 64.0 in | Wt 144.0 lb

## 2018-08-27 DIAGNOSIS — N281 Cyst of kidney, acquired: Secondary | ICD-10-CM | POA: Diagnosis not present

## 2018-08-27 NOTE — Progress Notes (Signed)
08/27/2018 4:25 PM   Sara David [birth year] [id number]  Referring provider: Marinda Elk, MD [facility] [place], [place] [address]  CC: Renal cyst  HPI: I saw Sara David in urology clinic today in consultation for a left renal cyst from Dr. Carrie Mew.  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  Her baseline creatinine is approximately 1.3, with eGFR of 40.  She had a follow-up CT abdomen pelvis with contrast on 07/25/2018 for abdominal pain which showed no renal lesions, cysts, or masses bilaterally.  CT imaging without contrast in May 2019 also did not show any suspicious renal findings.  She denies any gross hematuria or flank pain.  There is no family history of kidney cancer.  There are no aggravating or alleviating factors.  Severity is mild.  There is no history of microscopic hematuria   PMH: Past Medical History:  Diagnosis Date  . Anxiety   . Arthritis    "back" (04/03/2016)  . Breast cancer ([place]) 2012    no lumpectomy. Treating with meds- Right  . Cancer ([place])    small intestines 2012, gallbladder 2016  . Chronic lower back pain   . COPD (chronic obstructive pulmonary disease) ([place])    "use inhalers for COPD that dx'd at one time" (04/03/2016)  . Family history of adverse reaction to anesthesia    daughter gets nauseated  . GERD (gastroesophageal reflux disease)   . High cholesterol   . Hypertension   . Hypothyroidism   . Skin cancer    "right neck; mid forehead"    Surgical History: Past Surgical History:  Procedure Laterality Date  . ABDOMINAL HERNIA REPAIR     "after they took tumor out of my colon I developed a hernia"  . ABDOMINAL HYSTERECTOMY    . BACK SURGERY    . BREAST BIOPSY Right 2012   positive  .  BREAST BIOPSY Right 2014   positive  . BREAST BIOPSY Right 06/26/2016   US guided - waiting for pathology  . BREAST EXCISIONAL BIOPSY Right 1995   benign  . CARPAL TUNNEL RELEASE Left   . DILATION AND CURETTAGE OF UTERUS     S/P miscarriage  . IR GENERIC HISTORICAL  04/04/2016   IR LUMBAR DISC ASPIRATION W/IMG GUIDE 04/04/2016 [name], MD [facility]  . JOINT REPLACEMENT    . LAPAROSCOPIC CHOLECYSTECTOMY  2016  . REPLACEMENT TOTAL HIP W/  RESURFACING IMPLANTS Bilateral   . SKIN CANCER EXCISION     "right neck; mid forehead"  . TOTAL HIP ARTHROPLASTY Right 10/03/2015   Procedure: TOTAL HIP ARTHROPLASTY ANTERIOR APPROACH;  Surgeon: Hessie Knows, MD;  Location: [facility] ORS;  Service: Orthopedics;  Laterality: Right;  . TOTAL HIP ARTHROPLASTY Left 07/25/2016   Procedure: TOTAL HIP ARTHROPLASTY ANTERIOR APPROACH;  Surgeon: Hessie Knows, MD;  Location: [facility] ORS;  Service: Orthopedics;  Laterality: Left;  . TUBAL LIGATION    . TUMOR EXCISION  2012   "small intestine; came from the breast cancer"    Allergies:  Allergies  Allergen Reactions  . Carafate [Sucralfate] Nausea Only  . Diclofenac Sodium Nausea Only  . Erythromycin Diarrhea and Nausea And Vomiting  . Adhesive [Tape] Itching and Rash  . Wellbutrin [Bupropion] Itching and Rash    Family History: Family History  Problem Relation Age of Onset  . Lung cancer Brother 84  . Heart disease Father   . Diabetes Father   . Arthritis Mother   . Breast cancer Neg Hx   . Kidney cancer Neg Hx   . Prostate cancer Neg Hx     Social History:  reports that she has been smoking cigarettes. She has a 54.00 pack-year smoking history. She has never used smokeless tobacco. She reports that she does not drink alcohol or use drugs.  ROS: Please see flowsheet from today's date for complete review of systems.  Physical Exam: BP 130/77 (BP Location: Left Arm, Patient Position: Sitting)   Pulse 71   Ht _0  (1.626 m)   Wt 144 lb  (65.3 kg)   BMI 24.72 kg/m    Constitutional:  Alert and oriented Cardiovascular: No clubbing, cyanosis, or edema. Respiratory: Normal respiratory effort, no increased work of breathing. GI: Abdomen is soft, nontender, nondistended, no abdominal masses GU: No CVA tenderness Lymph: No cervical or inguinal lymphadenopathy. Skin: No rashes, bruises or suspicious lesions. Neurologic: Grossly intact, no focal deficits, moving all 4 extremities. Psychiatric: Normal mood and affect.  Pertinent Imaging: I have personally reviewed the CT abdomen pelvis with contrast dated 07/2018.  There is no hydronephrosis, renal masses, cysts, or other suspicious lesions  Assessment & Plan:   In summary, Sara David is a 73 year old female with metastatic stage IV breast cancer who was found to have a possible complex left renal cyst on ultrasound, however follow-up CT abdomen pelvis with contrast shows no suspicious findings or lesions.  I provided reassurance that she does not need any further imaging from a urologic perspective.  We also discussed that the renal function is typically managed by the nephrologist, and the importance of adequate hydration and avoiding NSAIDs.  Follow-up as needed   Billey Co, MD  [facility] [address], [place], [place] [address] [phone]

## 2018-09-13 NOTE — Progress Notes (Signed)
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  INTERVAL HISTORY: Patient returns to clinic today for further evaluation and continuation of Ibrance and letrozole.  She currently feels well and is asymptomatic.  She is tolerating treatment well without significant side effects.  She denies any pain today.  She has no neurologic complaints.  She denies any recent fevers or illnesses.  She denies any chest pain or shortness of breath. She has a fair appetite and denies weight loss.  She denies any nausea, vomiting, constipation, or diarrhea.  She has no melena or hematochezia.  She has no urinary complaints.  Patient feels at her baseline offers no specific complaints today.  REVIEW OF SYSTEMS:   Review of Systems  Constitutional: Negative for fever, malaise/fatigue and weight loss.  Respiratory: Negative.  Negative for cough and shortness of breath.   Cardiovascular: Negative.  Negative for chest pain and leg swelling.  Gastrointestinal: Negative.  Negative for abdominal pain, blood in stool, diarrhea and melena.  Genitourinary: Negative.   Musculoskeletal: Negative.  Negative for back pain and joint pain.  Skin: Negative.  Negative for rash.  Neurological: Negative.  Negative for sensory change, focal weakness, weakness and headaches.  Psychiatric/Behavioral: Negative.  The patient is not nervous/anxious.     As per HPI. Otherwise, a complete review of systems is negative.  PAST MEDICAL HISTORY: Past Medical History:  Diagnosis Date  . Anxiety   . Arthritis    "back"  (04/03/2016)  . Breast cancer ([place]) 2012    no lumpectomy. Treating with meds- Right  . Cancer ([place])    small intestines 2012, gallbladder 2016  . Chronic lower back pain   . COPD (chronic obstructive pulmonary disease) ([place])    "use inhalers for COPD that dx'd at one time" (04/03/2016)  . Family history of adverse reaction to anesthesia    daughter gets nauseated  . GERD (gastroesophageal reflux disease)   . High cholesterol   . Hypertension   . Hypothyroidism   . Skin cancer    "right neck; mid forehead"    PAST SURGICAL HISTORY: Past Surgical History:  Procedure Laterality Date  . ABDOMINAL HERNIA REPAIR     "after they took tumor out of my colon I developed a hernia"  . ABDOMINAL HYSTERECTOMY    . BACK SURGERY    . BREAST BIOPSY Right 2012   positive  . BREAST BIOPSY Right 2014   positive  . BREAST BIOPSY Right 06/26/2016   US guided - waiting for pathology  . BREAST EXCISIONAL BIOPSY Right 1995   benign  . CARPAL TUNNEL RELEASE Left   . DILATION AND CURETTAGE OF UTERUS     S/P miscarriage  . IR GENERIC HISTORICAL  04/04/2016   IR LUMBAR DISC ASPIRATION W/IMG GUIDE 04/04/2016 [name], [facility]  . JOINT REPLACEMENT    . LAPAROSCOPIC CHOLECYSTECTOMY  2016  . REPLACEMENT TOTAL HIP W/  RESURFACING IMPLANTS Bilateral   . SKIN CANCER EXCISION     "right neck; mid forehead"  . TOTAL HIP ARTHROPLASTY Right 10/03/2015   Procedure: TOTAL HIP ARTHROPLASTY ANTERIOR APPROACH;  Surgeon: Hessie Knows, MD;  Location:  [facility] ORS;  Service: Orthopedics;  Laterality: Right;  . TOTAL HIP ARTHROPLASTY Left 07/25/2016   Procedure: TOTAL HIP ARTHROPLASTY ANTERIOR APPROACH;  Surgeon: Hessie Knows, MD;  Location: [facility] ORS;  Service: Orthopedics;  Laterality: Left;  . TUBAL LIGATION    . TUMOR EXCISION  2012   "small intestine; came from the breast cancer"    FAMILY HISTORY: Reviewed and unchanged. No reported history of breast cancer or other chronic diseases.     ADVANCED  DIRECTIVES:    HEALTH MAINTENANCE: Social History   Tobacco Use  . Smoking status: Current Every Day Smoker    Packs/day: 1.00    Years: 54.00    Pack years: 54.00    Types: Cigarettes  . Smokeless tobacco: Never Used  . Tobacco comment: not ready to quit  Substance Use Topics  . Alcohol use: No  . Drug use: No     Colonoscopy:  PAP:  Bone density:  Lipid panel:  Allergies  Allergen Reactions  . Carafate [Sucralfate] Nausea Only  . Diclofenac Sodium Nausea Only  . Erythromycin Diarrhea and Nausea And Vomiting  . Adhesive [Tape] Itching and Rash  . Wellbutrin [Bupropion] Itching and Rash    Current Outpatient Medications  Medication Sig Dispense Refill  . acetaminophen (TYLENOL) 500 MG tablet Take 1,000 mg by mouth daily as needed for mild pain or fever.     . ALPRAZolam (XANAX) 0.5 MG tablet Take by mouth.    . cetirizine (ZYRTEC) 10 MG tablet Take 10 mg by mouth daily as needed for allergies. Reported on 10/03/2015    . citalopram (CELEXA) 20 MG tablet Take 20 mg by mouth every morning.     . cyclobenzaprine (FLEXERIL) 10 MG tablet cyclobenzaprine 10 mg tablet  TAKE 1 TABLET BY MOUTH EVERY 8 HOURS AS NEEDED    . dicyclomine (BENTYL) 10 MG capsule Take 10 mg by mouth 3 (three) times daily as needed for spasms.    . diphenoxylate-atropine (LOMOTIL) 2.5-0.025 MG tablet Take 1 tablet by mouth 4 (four) times daily as needed for diarrhea or loose stools. 30 tablet 0  . erythromycin ophthalmic ointment erythromycin 5 mg/gram (0.5 %) eye ointment  APPLY 0.25 INCH(ES) TO EYELIDS AT NIGHT    . FLUTICASONE PROPIONATE EX Apply topically.    . Fluticasone-Salmeterol (ADVAIR) 100-50 MCG/DOSE AEPB Inhale 1 puff into the lungs every morning.     . gabapentin (NEURONTIN) 300 MG capsule gabapentin 300 mg capsule    . HYDROcodone-acetaminophen (NORCO/VICODIN) 5-325 MG tablet hydrocodone 5 mg-acetaminophen 325 mg tablet    . IBRANCE 100 MG capsule TAKE 1 CAPSULE (100 MG TOTAL) BY MOUTH  DAILY. TAKE WHOLE WITH FOOD. TAKE FOR 21 DAYS ON, 7 DAYS OFF, REPEAT EVERY 28 DAYS. 21 capsule 4  . letrozole (FEMARA) 2.5 MG tablet TAKE 1 TABLET EVERY DAY 90 tablet 4  . levothyroxine (SYNTHROID, LEVOTHROID) 100 MCG tablet Take 100 mcg by mouth daily before breakfast.     . meloxicam (MOBIC) 15 MG tablet meloxicam 15 mg tablet  TAKE 1 TABLET(S) EVERY DAY BY ORAL ROUTE WITH MEALS.    . montelukast (SINGULAIR) 10 MG tablet Take 10 mg by mouth at bedtime.     . Multiple Vitamins-Minerals (CENTRUM SILVER PO) Take 1 tablet by mouth every morning.    Marland Kitchen omeprazole (PRILOSEC) 20 MG capsule Take 20 mg by mouth every morning.     . ondansetron (ZOFRAN ODT) 4 MG disintegrating tablet Take 1 tablet (4 mg total) by mouth every 8 (  eight) hours as needed for nausea or vomiting. 20 tablet 0  . oxyCODONE-acetaminophen (PERCOCET) 5-325 MG tablet Take 1 tablet by mouth 2 (two) times daily as needed. 20 tablet 0  . polyethylene glycol (MIRALAX / GLYCOLAX) packet Take 17 g by mouth daily. 14 each 0  . pravastatin (PRAVACHOL) 40 MG tablet Take 40 mg by mouth at bedtime.     . prochlorperazine (COMPAZINE) 10 MG tablet Take 1 tablet (10 mg total) by mouth every 6 (six) hours as needed for nausea or vomiting. 30 tablet 2  . traMADol (ULTRAM) 50 MG tablet Take 50 mg by mouth every 6 (six) hours as needed. for pain  0  . triamcinolone cream (KENALOG) 0.1 % APPLY TO AFFECTED AREA TWICE A DAY UNTIL CLEAR    . valsartan-hydrochlorothiazide (DIOVAN-HCT) 80-12.5 MG tablet Take 1 tablet by mouth daily.    Marland Kitchen zolpidem (AMBIEN) 10 MG tablet TAKE 1 TABLET BY MOUTH AT BEDTIME AS NEEDED FOR SLEEP 30 tablet 2  . letrozole (FEMARA) 2.5 MG tablet Take 1 tablet (2.5 mg total) by mouth daily. 30 tablet 11   No current facility-administered medications for this visit.     OBJECTIVE: Vitals:   09/17/18 1422  BP: 112/71  Pulse: 67  Temp: (!) 97.1 F (36.2 C)     Body mass index is 25.04 kg/m.    ECOG FS:0 -  Asymptomatic  General: Well-developed, well-nourished, no acute distress. Eyes: Pink conjunctiva, anicteric sclera. HEENT: Normocephalic, moist mucous membranes. Breast: Exam deferred today. Lungs: Clear to auscultation bilaterally. Heart: Regular rate and rhythm. No rubs, murmurs, or gallops. Abdomen: Soft, nontender, nondistended. No organomegaly noted, normoactive bowel sounds. Musculoskeletal: No edema, cyanosis, or clubbing. Neuro: Alert, answering all questions appropriately. Cranial nerves grossly intact. Skin: No rashes or petechiae noted. Psych: Normal affect.  LAB RESULTS: Lab Results  Component Value Date   NA 138 09/17/2018   K 3.4 (L) 09/17/2018   CL 103 09/17/2018   CO2 28 09/17/2018   GLUCOSE 140 (H) 09/17/2018   BUN 25 (H) 09/17/2018   CREATININE 1.24 (H) 09/17/2018   CALCIUM 8.8 (L) 09/17/2018   PROT 6.8 09/17/2018   ALBUMIN 4.2 09/17/2018   AST 24 09/17/2018   ALT 18 09/17/2018   ALKPHOS 63 09/17/2018   BILITOT 0.4 09/17/2018   GFRNONAA 43 (L) 09/17/2018   GFRAA 50 (L) 09/17/2018    Lab Results  Component Value Date   WBC 2.1 (L) 09/17/2018   NEUTROABS 1.3 (L) 09/17/2018   HGB 10.2 (L) 09/17/2018   HCT 30.5 (L) 09/17/2018   MCV 107.4 (H) 09/17/2018   PLT 88 (L) 09/17/2018    STUDIES: No results found.  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  PLAN:    1.  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 Patient's only obvious site of metastatic disease is in the sacral lesion which is only mildly progressed from previous.  Patient is also asymptomatic.  MRI of the brain is negative.  We previously discussed the possibility of doing XRT to the sacral lesion, but being that she is asymptomatic we can defer this to at  a later time.  Patient's CA 27-29 continues to be relatively stable since June 2019 ranging from 33.3-43.1.  Today's result is pending.  Patient continues  to have persistent pancytopenia, therefore will continue with dose reduced Ibrance to 100 mg every 21 days with 7 days off along with daily letrozole.  Return to clinic in 6 weeks for laboratory work only and then in 3 months for laboratory work and further evaluation. 2.  Hip/back pain: Significantly improved. Patient is status post bilateral hip replacement. 3.  Anemia: Patient's hemoglobin is decreased, but stable at 10.2.  Monitor. 4.  Renal insufficiency: Patient's creatinine of 1.24 appears to be approximately her baseline.  Monitor.   5.  Leukopenia: Decreased secondary to Ibrance.  Continue with dose reduced treatment as above. 6.  Thrombocytopenia: Patient's platelet count is 88 today.  Continue to monitor closely.  Continue dose reduced Ibrance as above.   7.  Diarrhea: Patient does not complain of this today.  Continue OTC Imodium as needed. 8.  Sacral lesion: Can consider XRT in the future if becomes symptomatic.  We also discussed the possibility of Zometa, but with only 1 isolated lesion patient wished to defer this at this time as well.  Patient expressed understanding and was in agreement with this plan. She also understands that She can call clinic at any time with any questions, concerns, or complaints.    Lloyd Huger, MD   09/18/2018 9:27 AM

## 2018-09-16 MED FILL — IBRANCE 100 MG CAPSULE: 100 | 28 days supply | Qty: 21 | Fill #3

## 2018-09-17 ENCOUNTER — Inpatient Hospital Stay: Payer: Medicare Other | Attending: Oncology

## 2018-09-17 ENCOUNTER — Other Ambulatory Visit: Payer: Medicare Other

## 2018-09-17 ENCOUNTER — Inpatient Hospital Stay (HOSPITAL_BASED_OUTPATIENT_CLINIC_OR_DEPARTMENT_OTHER): Payer: Medicare Other | Admitting: Oncology

## 2018-09-17 ENCOUNTER — Ambulatory Visit: Payer: Medicare Other | Admitting: Oncology

## 2018-09-17 ENCOUNTER — Other Ambulatory Visit: Payer: Self-pay

## 2018-09-17 VITALS — BP 112/71 | HR 67 | Temp 97.1°F | Ht 64.0 in | Wt 145.9 lb

## 2018-09-17 DIAGNOSIS — C7951 Secondary malignant neoplasm of bone: Secondary | ICD-10-CM | POA: Diagnosis not present

## 2018-09-17 DIAGNOSIS — C50411 Malignant neoplasm of upper-outer quadrant of right female breast: Secondary | ICD-10-CM | POA: Insufficient documentation

## 2018-09-17 DIAGNOSIS — D72819 Decreased white blood cell count, unspecified: Secondary | ICD-10-CM

## 2018-09-17 DIAGNOSIS — Z720 Tobacco use: Secondary | ICD-10-CM

## 2018-09-17 DIAGNOSIS — C7989 Secondary malignant neoplasm of other specified sites: Secondary | ICD-10-CM | POA: Diagnosis not present

## 2018-09-17 DIAGNOSIS — N289 Disorder of kidney and ureter, unspecified: Secondary | ICD-10-CM | POA: Insufficient documentation

## 2018-09-17 DIAGNOSIS — D649 Anemia, unspecified: Secondary | ICD-10-CM | POA: Diagnosis not present

## 2018-09-17 DIAGNOSIS — D696 Thrombocytopenia, unspecified: Secondary | ICD-10-CM | POA: Diagnosis not present

## 2018-09-17 DIAGNOSIS — C784 Secondary malignant neoplasm of small intestine: Secondary | ICD-10-CM

## 2018-09-17 DIAGNOSIS — M899 Disorder of bone, unspecified: Secondary | ICD-10-CM | POA: Diagnosis not present

## 2018-09-17 DIAGNOSIS — Z170 Estrogen receptor positive status [ER+]: Secondary | ICD-10-CM

## 2018-09-17 DIAGNOSIS — C50811 Malignant neoplasm of overlapping sites of right female breast: Secondary | ICD-10-CM

## 2018-09-17 DIAGNOSIS — C50919 Malignant neoplasm of unspecified site of unspecified female breast: Secondary | ICD-10-CM

## 2018-09-17 LAB — CBC WITH DIFFERENTIAL/PLATELET
Abs Immature Granulocytes: 0.01 10*3/uL (ref 0.00–0.07)
BASOS ABS: 0 10*3/uL (ref 0.0–0.1)
BASOS PCT: 1 %
EOS ABS: 0 10*3/uL (ref 0.0–0.5)
EOS PCT: 1 %
HEMATOCRIT: 30.5 % — AB (ref 36.0–46.0)
HEMOGLOBIN: 10.2 g/dL — AB (ref 12.0–15.0)
Immature Granulocytes: 1 %
LYMPHS ABS: 0.5 10*3/uL — AB (ref 0.7–4.0)
Lymphocytes Relative: 24 %
MCH: 35.9 pg — ABNORMAL HIGH (ref 26.0–34.0)
MCHC: 33.4 g/dL (ref 30.0–36.0)
MCV: 107.4 fL — AB (ref 80.0–100.0)
Monocytes Absolute: 0.2 10*3/uL (ref 0.1–1.0)
Monocytes Relative: 10 %
NRBC: 0 % (ref 0.0–0.2)
Neutro Abs: 1.3 10*3/uL — ABNORMAL LOW (ref 1.7–7.7)
Neutrophils Relative %: 63 %
Platelets: 88 10*3/uL — ABNORMAL LOW (ref 150–400)
RBC: 2.84 MIL/uL — AB (ref 3.87–5.11)
RDW: 13.3 % (ref 11.5–15.5)
WBC: 2.1 10*3/uL — AB (ref 4.0–10.5)

## 2018-09-17 LAB — COMPREHENSIVE METABOLIC PANEL
ALK PHOS: 63 U/L (ref 38–126)
ALT: 18 U/L (ref 0–44)
AST: 24 U/L (ref 15–41)
Albumin: 4.2 g/dL (ref 3.5–5.0)
Anion gap: 7 (ref 5–15)
BILIRUBIN TOTAL: 0.4 mg/dL (ref 0.3–1.2)
BUN: 25 mg/dL — AB (ref 8–23)
CALCIUM: 8.8 mg/dL — AB (ref 8.9–10.3)
CO2: 28 mmol/L (ref 22–32)
CREATININE: 1.24 mg/dL — AB (ref 0.44–1.00)
Chloride: 103 mmol/L (ref 98–111)
GFR, EST AFRICAN AMERICAN: 50 mL/min — AB (ref >60)
GFR, EST NON AFRICAN AMERICAN: 43 mL/min — AB (ref >60)
Glucose, Bld: 140 mg/dL — ABNORMAL HIGH (ref 70–99)
Potassium: 3.4 mmol/L — ABNORMAL LOW (ref 3.5–5.1)
Sodium: 138 mmol/L (ref 135–145)
TOTAL PROTEIN: 6.8 g/dL (ref 6.5–8.1)

## 2018-09-17 MED ORDER — LETROZOLE 2.5 MG PO TABS: 2.5000 mg | ORAL_TABLET | Freq: Every day | ORAL | 11 refills | Status: DC

## 2018-09-17 NOTE — Progress Notes (Signed)
Patient is here today to follow up on her carcinoma of right breast metastatic to multiple sites. Patient stated that she had been doing well with no complaints. Patient had a PET Scan done on 06/22/2018 and a CT Scan of the abdomen and pelvis on 07/25/2018 and they were both good. Patient denied nipple discharge, skin discoloration, pain or lumps/knots. Patient requested a refill on her Letrozole. This was sent to her pharmacy [facility].

## 2018-09-18 LAB — CANCER ANTIGEN 27.29: CA 27.29: 40.5 U/mL — ABNORMAL HIGH (ref 0.0–38.6)

## 2018-10-12 ENCOUNTER — Other Ambulatory Visit: Payer: Self-pay | Admitting: Oncology

## 2018-10-12 DIAGNOSIS — C801 Malignant (primary) neoplasm, unspecified: Secondary | ICD-10-CM

## 2018-10-12 DIAGNOSIS — C50911 Malignant neoplasm of unspecified site of right female breast: Secondary | ICD-10-CM

## 2018-10-14 MED FILL — IBRANCE 100 MG CAPSULE: 100 | 28 days supply | Qty: 21 | Fill #4

## 2018-10-29 ENCOUNTER — Other Ambulatory Visit: Payer: Self-pay

## 2018-10-29 ENCOUNTER — Inpatient Hospital Stay: Payer: Medicare Other | Attending: Oncology

## 2018-10-29 DIAGNOSIS — C7951 Secondary malignant neoplasm of bone: Secondary | ICD-10-CM | POA: Insufficient documentation

## 2018-10-29 DIAGNOSIS — C784 Secondary malignant neoplasm of small intestine: Secondary | ICD-10-CM | POA: Diagnosis not present

## 2018-10-29 DIAGNOSIS — C50411 Malignant neoplasm of upper-outer quadrant of right female breast: Secondary | ICD-10-CM | POA: Diagnosis not present

## 2018-10-29 DIAGNOSIS — C50919 Malignant neoplasm of unspecified site of unspecified female breast: Secondary | ICD-10-CM

## 2018-10-29 DIAGNOSIS — C7989 Secondary malignant neoplasm of other specified sites: Secondary | ICD-10-CM | POA: Diagnosis not present

## 2018-10-29 LAB — COMPREHENSIVE METABOLIC PANEL
ALT: 17 U/L (ref 0–44)
AST: 23 U/L (ref 15–41)
Albumin: 4.3 g/dL (ref 3.5–5.0)
Alkaline Phosphatase: 84 U/L (ref 38–126)
Anion gap: 9 (ref 5–15)
BUN: 32 mg/dL — AB (ref 8–23)
CALCIUM: 9.2 mg/dL (ref 8.9–10.3)
CHLORIDE: 102 mmol/L (ref 98–111)
CO2: 27 mmol/L (ref 22–32)
CREATININE: 1.61 mg/dL — AB (ref 0.44–1.00)
GFR calc Af Amer: 37 mL/min — ABNORMAL LOW (ref >60)
GFR calc non Af Amer: 32 mL/min — ABNORMAL LOW (ref >60)
GLUCOSE: 132 mg/dL — AB (ref 70–99)
Potassium: 3.8 mmol/L (ref 3.5–5.1)
SODIUM: 138 mmol/L (ref 135–145)
TOTAL PROTEIN: 7.4 g/dL (ref 6.5–8.1)
Total Bilirubin: 0.5 mg/dL (ref 0.3–1.2)

## 2018-10-29 LAB — CBC WITH DIFFERENTIAL/PLATELET
ABS IMMATURE GRANULOCYTES: 0.01 10*3/uL (ref 0.00–0.07)
BASOS PCT: 2 %
Basophils Absolute: 0.1 10*3/uL (ref 0.0–0.1)
EOS PCT: 1 %
Eosinophils Absolute: 0 10*3/uL (ref 0.0–0.5)
HCT: 31.2 % — ABNORMAL LOW (ref 36.0–46.0)
Hemoglobin: 10.6 g/dL — ABNORMAL LOW (ref 12.0–15.0)
Immature Granulocytes: 0 %
Lymphocytes Relative: 20 %
Lymphs Abs: 0.6 10*3/uL — ABNORMAL LOW (ref 0.7–4.0)
MCH: 36.4 pg — AB (ref 26.0–34.0)
MCHC: 34 g/dL (ref 30.0–36.0)
MCV: 107.2 fL — AB (ref 80.0–100.0)
MONO ABS: 0.2 10*3/uL (ref 0.1–1.0)
MONOS PCT: 6 %
Neutro Abs: 2.2 10*3/uL (ref 1.7–7.7)
Neutrophils Relative %: 71 %
PLATELETS: 259 10*3/uL (ref 150–400)
RBC: 2.91 MIL/uL — AB (ref 3.87–5.11)
RDW: 13.9 % (ref 11.5–15.5)
WBC: 3.1 10*3/uL — AB (ref 4.0–10.5)
nRBC: 0 % (ref 0.0–0.2)

## 2018-10-30 LAB — CANCER ANTIGEN 27.29: CA 27.29: 43.9 U/mL — ABNORMAL HIGH (ref 0.0–38.6)

## 2018-11-03 ENCOUNTER — Other Ambulatory Visit: Payer: Self-pay | Admitting: Oncology

## 2018-11-03 DIAGNOSIS — C50911 Malignant neoplasm of unspecified site of right female breast: Secondary | ICD-10-CM

## 2018-11-09 MED FILL — IBRANCE 100 MG CAPSULE: 100 | 28 days supply | Qty: 21 | Fill #0

## 2018-12-08 MED FILL — IBRANCE 100 MG CAPSULE: 100 | 28 days supply | Qty: 21 | Fill #1

## 2018-12-10 ENCOUNTER — Telehealth: Payer: Self-pay | Admitting: Oncology

## 2018-12-10 ENCOUNTER — Other Ambulatory Visit: Payer: Self-pay

## 2018-12-11 ENCOUNTER — Inpatient Hospital Stay: Payer: Medicare Other

## 2018-12-11 ENCOUNTER — Inpatient Hospital Stay: Payer: Medicare Other | Attending: Oncology | Admitting: Oncology

## 2018-12-11 ENCOUNTER — Other Ambulatory Visit: Payer: Self-pay

## 2018-12-11 ENCOUNTER — Encounter: Payer: Self-pay | Admitting: Oncology

## 2018-12-11 DIAGNOSIS — C50411 Malignant neoplasm of upper-outer quadrant of right female breast: Secondary | ICD-10-CM | POA: Insufficient documentation

## 2018-12-11 DIAGNOSIS — C50911 Malignant neoplasm of unspecified site of right female breast: Secondary | ICD-10-CM

## 2018-12-11 DIAGNOSIS — C50919 Malignant neoplasm of unspecified site of unspecified female breast: Secondary | ICD-10-CM

## 2018-12-11 LAB — CBC WITH DIFFERENTIAL/PLATELET
Abs Immature Granulocytes: 0 10*3/uL (ref 0.00–0.07)
Basophils Absolute: 0.1 10*3/uL (ref 0.0–0.1)
Basophils Relative: 2 %
Eosinophils Absolute: 0 10*3/uL (ref 0.0–0.5)
Eosinophils Relative: 1 %
HCT: 33.4 % — ABNORMAL LOW (ref 36.0–46.0)
Hemoglobin: 11.4 g/dL — ABNORMAL LOW (ref 12.0–15.0)
Immature Granulocytes: 0 %
Lymphocytes Relative: 24 %
Lymphs Abs: 0.7 10*3/uL (ref 0.7–4.0)
MCH: 36.7 pg — ABNORMAL HIGH (ref 26.0–34.0)
MCHC: 34.1 g/dL (ref 30.0–36.0)
MCV: 107.4 fL — ABNORMAL HIGH (ref 80.0–100.0)
Monocytes Absolute: 0.3 10*3/uL (ref 0.1–1.0)
Monocytes Relative: 12 %
Neutro Abs: 1.7 10*3/uL (ref 1.7–7.7)
Neutrophils Relative %: 61 %
Platelets: 112 10*3/uL — ABNORMAL LOW (ref 150–400)
RBC: 3.11 MIL/uL — ABNORMAL LOW (ref 3.87–5.11)
RDW: 14.2 % (ref 11.5–15.5)
WBC: 2.8 10*3/uL — ABNORMAL LOW (ref 4.0–10.5)
nRBC: 0 % (ref 0.0–0.2)

## 2018-12-11 LAB — COMPREHENSIVE METABOLIC PANEL
ALT: 15 U/L (ref 0–44)
AST: 24 U/L (ref 15–41)
Albumin: 4.3 g/dL (ref 3.5–5.0)
Alkaline Phosphatase: 80 U/L (ref 38–126)
Anion gap: 8 (ref 5–15)
BUN: 24 mg/dL — ABNORMAL HIGH (ref 8–23)
CO2: 28 mmol/L (ref 22–32)
Calcium: 9.1 mg/dL (ref 8.9–10.3)
Chloride: 104 mmol/L (ref 98–111)
Creatinine, Ser: 1.22 mg/dL — ABNORMAL HIGH (ref 0.44–1.00)
GFR calc Af Amer: 51 mL/min — ABNORMAL LOW (ref >60)
GFR calc non Af Amer: 44 mL/min — ABNORMAL LOW (ref >60)
Glucose, Bld: 97 mg/dL (ref 70–99)
Potassium: 4.1 mmol/L (ref 3.5–5.1)
Sodium: 140 mmol/L (ref 135–145)
Total Bilirubin: 0.5 mg/dL (ref 0.3–1.2)
Total Protein: 7.6 g/dL (ref 6.5–8.1)

## 2018-12-11 NOTE — Progress Notes (Signed)
[place]  Telephone:(336) [phone] Fax:(336) [phone]  ID: Margarine Grosshans OB: May 14, 1946  MR#: [id number]  SFK#:[id number]  [name], [name] Hessie Knows, MD as Consulting Physician (Orthopedic Surgery) Murlean Iba, MD (Nephrology)  I connected with [name] on 12/11/18 at  2:00 PM EDT by video enabled telemedicine visit and verified that I am speaking with the correct person using two identifiers.   I discussed the limitations, risks, security and privacy concerns of performing an evaluation and management service by telemedicine and the availability of in-person appointments. I also discussed with the patient that there may be a patient responsible charge related to this service. The patient expressed understanding and agreed to proceed.   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  INTERVAL HISTORY: Patient agreed to video and able telemedicine visit today for discussion of her laboratory work and routine follow-up.  She continues to tolerate Ibrance and letrozole well.  She currently feels well and is asymptomatic.  She denies any pain. She has no neurologic complaints.  She denies any recent fevers or illnesses.  She denies any chest pain, shortness of breath, cough, or hemoptysis.  She has a fair appetite and denies weight loss.  She denies any nausea, vomiting, constipation, or diarrhea.  She has no melena or hematochezia.  She has no urinary complaints.  Patient feels at her baseline offers no specific complaints today.    REVIEW OF SYSTEMS:   Review of Systems  Constitutional: Negative for fever, malaise/fatigue and weight loss.  Respiratory: Negative.   Negative for cough and shortness of breath.   Cardiovascular: Negative.  Negative for chest pain and leg swelling.  Gastrointestinal: Negative.  Negative for abdominal pain, blood in stool, diarrhea and melena.  Genitourinary: Negative.   Musculoskeletal: Negative.  Negative for back pain and joint pain.  Skin: Negative.  Negative for rash.  Neurological: Negative.  Negative for sensory change, focal weakness, weakness and headaches.  Psychiatric/Behavioral: Negative.  The patient is not nervous/anxious.     As per HPI. Otherwise, a complete review of systems is negative.  PAST MEDICAL HISTORY: Past Medical History:  Diagnosis Date  . Anxiety   . Arthritis    "back" (04/03/2016)  . Breast cancer ([place]) 2012    no lumpectomy. Treating with meds- Right  . Cancer ([place])    small intestines 2012, gallbladder 2016  . Chronic lower back pain   . COPD (chronic obstructive pulmonary disease) ([place])    "use inhalers for COPD that dx'd at one time" (04/03/2016)  . Family history of adverse reaction to anesthesia    daughter gets nauseated  . GERD (gastroesophageal reflux disease)   . High cholesterol   . Hypertension   . Hypothyroidism   . Skin cancer    "right neck; mid forehead"    PAST SURGICAL HISTORY: Past Surgical History:  Procedure Laterality Date  . ABDOMINAL HERNIA REPAIR     "after they took tumor out of my colon I developed a hernia"  . ABDOMINAL HYSTERECTOMY    . BACK SURGERY    . BREAST BIOPSY Right 2012   positive  . BREAST BIOPSY Right 2014   positive  . BREAST BIOPSY Right 06/26/2016   US guided -  waiting for pathology  . BREAST EXCISIONAL BIOPSY Right 1995   benign  . CARPAL TUNNEL RELEASE Left   . DILATION AND CURETTAGE OF UTERUS     S/P miscarriage  . IR GENERIC HISTORICAL  04/04/2016   IR LUMBAR DISC ASPIRATION W/IMG GUIDE 04/04/2016 [name], [facility]  . JOINT REPLACEMENT    . LAPAROSCOPIC CHOLECYSTECTOMY  2016  . REPLACEMENT TOTAL HIP W/   RESURFACING IMPLANTS Bilateral   . SKIN CANCER EXCISION     "right neck; mid forehead"  . TOTAL HIP ARTHROPLASTY Right 10/03/2015   Procedure: TOTAL HIP ARTHROPLASTY ANTERIOR APPROACH;  Surgeon: Hessie Knows, MD;  Location: [facility] ORS;  Service: Orthopedics;  Laterality: Right;  . TOTAL HIP ARTHROPLASTY Left 07/25/2016   Procedure: TOTAL HIP ARTHROPLASTY ANTERIOR APPROACH;  Surgeon: Hessie Knows, MD;  Location: [facility] ORS;  Service: Orthopedics;  Laterality: Left;  . TUBAL LIGATION    . TUMOR EXCISION  2012   "small intestine; came from the breast cancer"    FAMILY HISTORY: Reviewed and unchanged. No reported history of breast cancer or other chronic diseases.     ADVANCED DIRECTIVES:    HEALTH MAINTENANCE: Social History   Tobacco Use  . Smoking status: Current Every Day Smoker    Packs/day: 1.00    Years: 54.00    Pack years: 54.00    Types: Cigarettes  . Smokeless tobacco: Never Used  . Tobacco comment: not ready to quit  Substance Use Topics  . Alcohol use: No  . Drug use: No     Colonoscopy:  PAP:  Bone density:  Lipid panel:  Allergies  Allergen Reactions  . Carafate [Sucralfate] Nausea Only  . Diclofenac Sodium Nausea Only  . Erythromycin Diarrhea and Nausea And Vomiting  . Adhesive [Tape] Itching and Rash  . Wellbutrin [Bupropion] Itching and Rash    Current Outpatient Medications  Medication Sig Dispense Refill  . acetaminophen (TYLENOL) 500 MG tablet Take 1,000 mg by mouth daily as needed for mild pain or fever.     Marland Kitchen albuterol (VENTOLIN HFA) 108 (90 Base) MCG/ACT inhaler Inhale 2 puffs into the lungs every 4 (four) hours as needed.    . ALPRAZolam (XANAX) 0.5 MG tablet Take by mouth.    . cetirizine (ZYRTEC) 10 MG tablet Take 10 mg by mouth daily as needed for allergies. Reported on 10/03/2015    . citalopram (CELEXA) 20 MG tablet Take 20 mg by mouth every morning.     . cyclobenzaprine (FLEXERIL) 10 MG tablet cyclobenzaprine 10 mg tablet  TAKE 1 TABLET BY  MOUTH EVERY 8 HOURS AS NEEDED    . dicyclomine (BENTYL) 10 MG capsule Take 10 mg by mouth 3 (three) times daily as needed for spasms.    . diphenoxylate-atropine (LOMOTIL) 2.5-0.025 MG tablet Take 1 tablet by mouth 4 (four) times daily as needed for diarrhea or loose stools. 30 tablet 0  . erythromycin ophthalmic ointment erythromycin 5 mg/gram (0.5 %) eye ointment  APPLY 0.25 INCH(ES) TO EYELIDS AT NIGHT    . FLUTICASONE PROPIONATE EX Apply topically.    . Fluticasone-Salmeterol (ADVAIR) 100-50 MCG/DOSE AEPB Inhale 1 puff into the lungs every morning.     . gabapentin (NEURONTIN) 300 MG capsule gabapentin 300 mg capsule    . HYDROcodone-acetaminophen (NORCO/VICODIN) 5-325 MG tablet hydrocodone 5 mg-acetaminophen 325 mg tablet    . IBRANCE 100 MG capsule TAKE 1 CAPSULE (100 MG TOTAL) BY MOUTH DAILY. TAKE WHOLE WITH FOOD. TAKE FOR 21 DAYS ON, 7  DAYS OFF, REPEAT EVERY 28 DAYS. 21 capsule 4  . letrozole (FEMARA) 2.5 MG tablet TAKE 1 TABLET EVERY DAY 90 tablet 4  . letrozole (FEMARA) 2.5 MG tablet Take 1 tablet (2.5 mg total) by mouth daily. 30 tablet 11  . levothyroxine (SYNTHROID, LEVOTHROID) 100 MCG tablet Take 100 mcg by mouth daily before breakfast.     . meloxicam (MOBIC) 15 MG tablet meloxicam 15 mg tablet  TAKE 1 TABLET(S) EVERY DAY BY ORAL ROUTE WITH MEALS.    . montelukast (SINGULAIR) 10 MG tablet Take 10 mg by mouth at bedtime.     . Multiple Vitamins-Minerals (CENTRUM SILVER PO) Take 1 tablet by mouth every morning.    Marland Kitchen omeprazole (PRILOSEC) 20 MG capsule Take 20 mg by mouth every morning.     . ondansetron (ZOFRAN ODT) 4 MG disintegrating tablet Take 1 tablet (4 mg total) by mouth every 8 (eight) hours as needed for nausea or vomiting. 20 tablet 0  . oxyCODONE-acetaminophen (PERCOCET) 5-325 MG tablet Take 1 tablet by mouth 2 (two) times daily as needed. 20 tablet 0  . polyethylene glycol (MIRALAX / GLYCOLAX) packet Take 17 g by mouth daily. 14 each 0  . pravastatin (PRAVACHOL) 40 MG  tablet Take 40 mg by mouth at bedtime.     . prochlorperazine (COMPAZINE) 10 MG tablet Take 1 tablet (10 mg total) by mouth every 6 (six) hours as needed for nausea or vomiting. 30 tablet 2  . traMADol (ULTRAM) 50 MG tablet Take 50 mg by mouth every 6 (six) hours as needed. for pain  0  . triamcinolone cream (KENALOG) 0.1 % APPLY TO AFFECTED AREA TWICE A DAY UNTIL CLEAR    . valsartan-hydrochlorothiazide (DIOVAN-HCT) 80-12.5 MG tablet Take 1 tablet by mouth daily.    Marland Kitchen zolpidem (AMBIEN) 10 MG tablet TAKE 1 TABLET BY MOUTH EVERY DAY AT BEDTIME AS NEEDED FOR SLEEP 30 tablet 2   No current facility-administered medications for this visit.     OBJECTIVE: There were no vitals filed for this visit.   There is no height or weight on file to calculate BMI.    ECOG FS:0 - Asymptomatic  General: Well-developed, well-nourished, no acute distress. HEENT: Normocephalic, moist mucous membranes. Neuro: Alert, answering all questions appropriately. Cranial nerves grossly intact. Skin: No rashes or petechiae noted. Psych: Normal affect.  LAB RESULTS: Lab Results  Component Value Date   NA 140 12/11/2018   K 4.1 12/11/2018   CL 104 12/11/2018   CO2 28 12/11/2018   GLUCOSE 97 12/11/2018   BUN 24 (H) 12/11/2018   CREATININE 1.22 (H) 12/11/2018   CALCIUM 9.1 12/11/2018   PROT 7.6 12/11/2018   ALBUMIN 4.3 12/11/2018   AST 24 12/11/2018   ALT 15 12/11/2018   ALKPHOS 80 12/11/2018   BILITOT 0.5 12/11/2018   GFRNONAA 44 (L) 12/11/2018   GFRAA 51 (L) 12/11/2018    Lab Results  Component Value Date   WBC 2.8 (L) 12/11/2018   NEUTROABS 1.7 12/11/2018   HGB 11.4 (L) 12/11/2018   HCT 33.4 (L) 12/11/2018   MCV 107.4 (H) 12/11/2018   PLT 112 (L) 12/11/2018    STUDIES: No results found.  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  PLAN:    1.  Stage IV lobular upper outer right breast cancer with  metastatic lesions in small bowel, gall bladder, and bone: MRI of brain and PET scan completed on  June 22, 2018 reviewed independently. Patient's only obvious site of metastatic disease is in the sacral lesion which is only mildly progressed from previous.  Patient is also asymptomatic.  MRI of the brain is negative.  We previously discussed the possibility of doing XRT to the sacral lesion, but being that she is asymptomatic we can defer this to at a later time.  Patient's CA 27-29 continues to be relatively stable since June 2019 ranging from 33.3-43.9.  Today's result is 43.9.  She continues to have persistent pancytopenia, but this is chronic and unchanged.  Continue with dose reduced Ibrance to 100 mg every 21 days with 7 days off along with daily letrozole.  Patient states her next cycle is due to start in 2 days.  Return to clinic in 6 weeks for laboratory work only and then in 3 months for laboratory work and further evaluation.  2.  Hip/back pain: Patient does not complain of this today.  Patient is status post bilateral hip replacement. 3.  Anemia: Patient hemoglobin has trended up slightly to 11.4, monitor. 4.  Renal insufficiency: Patient's creatinine is 1.22 which appears to be approximately her baseline.  Monitor.   5.  Leukopenia: Decreased secondary to Ibrance.  Continue with dose reduced treatment as above. 6.  Thrombocytopenia: Patient platelet count is 112 today.  Secondary to Ibrance.  Proceed with dose reduced treatment as above.   7.  Diarrhea: Patient does not complain of this today.  Continue OTC Imodium as needed. 8.  Sacral lesion: Can consider XRT in the future if becomes symptomatic.  We also discussed the possibility of Zometa, but with only 1 isolated lesion patient wished to defer this at this time as well.  I provided 20 minutes of face-to-face video visit time during this encounter, and > 50% was spent counseling as documented under my assessment & plan.   Patient  expressed understanding and was in agreement with this plan. She also understands that She can call clinic at any time with any questions, concerns, or complaints.    Lloyd Huger, MD   12/11/2018 2:14 PM

## 2018-12-11 NOTE — Progress Notes (Signed)
Patient stated that she had been doing well with no complaints. 

## 2018-12-12 LAB — CANCER ANTIGEN 27.29: CA 27.29: 56.1 U/mL — ABNORMAL HIGH (ref 0.0–38.6)

## 2018-12-16 ENCOUNTER — Telehealth: Payer: Self-pay | Admitting: Pharmacy Technician

## 2018-12-16 NOTE — Telephone Encounter (Signed)
Oral Oncology Patient Advocate Encounter  Received notification from [facility] that prior authorization for Ibrance (tablets) is required.  PA submitted on CoverMyMeds Key [id number] Status is pending  Oral Oncology Clinic will continue to follow.  [place] Patient [place] Phone [phone] Fax [phone] 12/16/2018 10:22 AM

## 2018-12-21 NOTE — Telephone Encounter (Signed)
Oral Oncology Patient Advocate Encounter  Prior Authorization for Sara David has been approved.  (Approval states for capsules, but when I ran a test claim for tablets it was approved).  PA# [id number]  Effective dates: 12/18/2018 - until further notice.  Oral Oncology Clinic will continue to follow.   [place] Patient [place] Phone [phone] Fax [phone] 12/21/2018 8:33 AM

## 2019-01-05 MED FILL — IBRANCE 100 MG CAPSULE: 100 | 28 days supply | Qty: 21 | Fill #2

## 2019-01-10 ENCOUNTER — Other Ambulatory Visit: Payer: Self-pay | Admitting: Oncology

## 2019-01-10 DIAGNOSIS — C801 Malignant (primary) neoplasm, unspecified: Secondary | ICD-10-CM

## 2019-01-10 DIAGNOSIS — C50911 Malignant neoplasm of unspecified site of right female breast: Secondary | ICD-10-CM

## 2019-01-15 ENCOUNTER — Other Ambulatory Visit
Admission: RE | Admit: 2019-01-15 | Discharge: 2019-01-15 | Disposition: A | Discharge status: Home / Self Care | Payer: Medicare Other | Source: Ambulatory Visit | Attending: Physician Assistant | Admitting: Physician Assistant

## 2019-01-15 ENCOUNTER — Other Ambulatory Visit: Payer: Self-pay | Admitting: Physician Assistant

## 2019-01-15 ENCOUNTER — Other Ambulatory Visit: Payer: Self-pay

## 2019-01-15 ENCOUNTER — Ambulatory Visit
Admission: RE | Admit: 2019-01-15 | Discharge: 2019-01-15 | Disposition: A | Discharge status: Home / Self Care | Payer: Medicare Other | Source: Ambulatory Visit | Attending: Physician Assistant | Admitting: Physician Assistant

## 2019-01-15 DIAGNOSIS — R7989 Other specified abnormal findings of blood chemistry: Secondary | ICD-10-CM | POA: Insufficient documentation

## 2019-01-15 DIAGNOSIS — R079 Chest pain, unspecified: Secondary | ICD-10-CM | POA: Insufficient documentation

## 2019-01-15 LAB — FIBRIN DERIVATIVES D-DIMER (ARMC ONLY): Fibrin derivatives D-dimer (ARMC): 1056.12 ng/mL (FEU) — ABNORMAL HIGH (ref 0.00–499.00)

## 2019-01-15 MED ORDER — IOHEXOL 350 MG/ML SOLN
75.0000 mL | Freq: Once | INTRAVENOUS | Status: AC | PRN
  Administered 2019-01-15: 60 mL via INTRAVENOUS

## 2019-01-20 ENCOUNTER — Other Ambulatory Visit: Payer: Self-pay

## 2019-01-20 ENCOUNTER — Telehealth: Payer: Self-pay | Admitting: *Deleted

## 2019-01-20 DIAGNOSIS — C50911 Malignant neoplasm of unspecified site of right female breast: Secondary | ICD-10-CM

## 2019-01-20 NOTE — Telephone Encounter (Signed)
Mimi McGlothlin, PA  from [facility] called regarding patient she had a elevated D Dimer and a CT angio was done showing some areas of concern for cancer. Patient does not come back until end of July. Dr Grayland Ormond, please retrun call to [place] ext [phone].  Study Result   CLINICAL DATA:  Dull pain in chest. History of breast cancer. Chemotherapy ongoing.  EXAM: CT ANGIOGRAPHY CHEST WITH CONTRAST  TECHNIQUE: Multidetector CT imaging of the chest was performed using the standard protocol during bolus administration of intravenous contrast. Multiplanar CT image reconstructions and MIPs were obtained to evaluate the vascular anatomy.  CONTRAST:  51mL OMNIPAQUE IOHEXOL 350 MG/ML SOLN  COMPARISON:  CT abdomen pelvis 07/25/2018, PET-CT 06/22/2018  FINDINGS: Cardiovascular: No filling defects within the pulmonary arteries to suggest acute pulmonary embolism. No acute findings of the aorta or great vessels. No pericardial fluid. No significant vascular findings. Normal heart size. No pericardial effusion.  Mediastinum/Nodes: No axillary or supraclavicular adenopathy. No mediastinal hilar adenopathy. No pericardial effusion. Esophagus normal.  Lungs/Pleura: No suspicious pulmonary nodules.  Upper Abdomen: Limited view of the liver, kidneys, pancreas are unremarkable. Normal adrenal glands.  Musculoskeletal: several sclerotic spine lesions are increased in size. One 1 lytic spine lesion increased in size.  For example sclerotic lesion at the T11 vertebral body involving the anterior aspect of vertebral body measuring 17 mm in thickness compared to 9 mm on comparison CT 07/25/2018.  Within the thoracic spine, in comparison to the CT portion of the PET CT, T4 vertebral body 11 mm sclerotic lesion increased from 4 mm (image 26/4).  Lytic lesion at T8 measuring 11 mm (image 59/4) is new.  Additional scattered sclerotic and skeletal lesions again noted. The sclerotic  lesions are increased.  Increase in sclerosis of the inferior aspect of the sternum.  Review of the MIP images confirms the above findings.  IMPRESSION: 1. No evidence acute pulmonary embolism. 2. 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 Multiple sclerotic lesions increased in size. At least 1 lytic lesions new from prior. Sclerotic lesions involving sternum which may explain chest pain. Consider repeat FDG PET scan or MDP bone scan.   Electronically Signed   By: Suzy Bouchard M.D.   On: 01/15/2019 17:15

## 2019-01-21 ENCOUNTER — Inpatient Hospital Stay: Payer: Medicare Other | Attending: Oncology

## 2019-01-21 ENCOUNTER — Other Ambulatory Visit: Payer: Self-pay

## 2019-01-21 DIAGNOSIS — C7989 Secondary malignant neoplasm of other specified sites: Secondary | ICD-10-CM | POA: Diagnosis not present

## 2019-01-21 DIAGNOSIS — C50919 Malignant neoplasm of unspecified site of unspecified female breast: Secondary | ICD-10-CM

## 2019-01-21 DIAGNOSIS — C784 Secondary malignant neoplasm of small intestine: Secondary | ICD-10-CM | POA: Insufficient documentation

## 2019-01-21 DIAGNOSIS — C7951 Secondary malignant neoplasm of bone: Secondary | ICD-10-CM | POA: Diagnosis not present

## 2019-01-21 DIAGNOSIS — C50411 Malignant neoplasm of upper-outer quadrant of right female breast: Secondary | ICD-10-CM | POA: Diagnosis not present

## 2019-01-21 LAB — COMPREHENSIVE METABOLIC PANEL
ALT: 16 U/L (ref 0–44)
AST: 22 U/L (ref 15–41)
Albumin: 4.1 g/dL (ref 3.5–5.0)
Alkaline Phosphatase: 78 U/L (ref 38–126)
Anion gap: 10 (ref 5–15)
BUN: 29 mg/dL — ABNORMAL HIGH (ref 8–23)
CO2: 25 mmol/L (ref 22–32)
Calcium: 9 mg/dL (ref 8.9–10.3)
Chloride: 102 mmol/L (ref 98–111)
Creatinine, Ser: 1.5 mg/dL — ABNORMAL HIGH (ref 0.44–1.00)
GFR calc Af Amer: 40 mL/min — ABNORMAL LOW (ref >60)
GFR calc non Af Amer: 34 mL/min — ABNORMAL LOW (ref >60)
Glucose, Bld: 115 mg/dL — ABNORMAL HIGH (ref 70–99)
Potassium: 4.1 mmol/L (ref 3.5–5.1)
Sodium: 137 mmol/L (ref 135–145)
Total Bilirubin: 0.6 mg/dL (ref 0.3–1.2)
Total Protein: 7.3 g/dL (ref 6.5–8.1)

## 2019-01-21 LAB — CBC WITH DIFFERENTIAL/PLATELET
Abs Immature Granulocytes: 0.01 10*3/uL (ref 0.00–0.07)
Basophils Absolute: 0 10*3/uL (ref 0.0–0.1)
Basophils Relative: 1 %
Eosinophils Absolute: 0 10*3/uL (ref 0.0–0.5)
Eosinophils Relative: 1 %
HCT: 32.2 % — ABNORMAL LOW (ref 36.0–46.0)
Hemoglobin: 11 g/dL — ABNORMAL LOW (ref 12.0–15.0)
Immature Granulocytes: 0 %
Lymphocytes Relative: 20 %
Lymphs Abs: 0.6 10*3/uL — ABNORMAL LOW (ref 0.7–4.0)
MCH: 36.5 pg — ABNORMAL HIGH (ref 26.0–34.0)
MCHC: 34.2 g/dL (ref 30.0–36.0)
MCV: 107 fL — ABNORMAL HIGH (ref 80.0–100.0)
Monocytes Absolute: 0.2 10*3/uL (ref 0.1–1.0)
Monocytes Relative: 7 %
Neutro Abs: 2.1 10*3/uL (ref 1.7–7.7)
Neutrophils Relative %: 71 %
Platelets: 253 10*3/uL (ref 150–400)
RBC: 3.01 MIL/uL — ABNORMAL LOW (ref 3.87–5.11)
RDW: 13.2 % (ref 11.5–15.5)
Smear Review: NORMAL
WBC: 2.9 10*3/uL — ABNORMAL LOW (ref 4.0–10.5)
nRBC: 0 % (ref 0.0–0.2)

## 2019-01-21 NOTE — Telephone Encounter (Signed)
PET scan was ordered. Brooke, please call patient with PET appt and Doximity visit with Dr. Grayland Ormond 1-2 days later. Thank you!

## 2019-01-21 NOTE — Telephone Encounter (Signed)
Left message with mimi.  PLease order PET and lab next week with virtual f/u 1-2 days later.  Cancel July appt.

## 2019-01-22 LAB — CANCER ANTIGEN 27.29: CA 27.29: 58.1 U/mL — ABNORMAL HIGH (ref 0.0–38.6)

## 2019-01-29 ENCOUNTER — Other Ambulatory Visit: Payer: Self-pay

## 2019-01-29 ENCOUNTER — Telehealth: Payer: Self-pay | Admitting: Oncology

## 2019-01-29 ENCOUNTER — Encounter
Admission: RE | Admit: 2019-01-29 | Discharge: 2019-01-29 | Disposition: A | Discharge status: Home / Self Care | Payer: Medicare Other | Source: Ambulatory Visit | Attending: Oncology | Admitting: Oncology

## 2019-01-29 DIAGNOSIS — C50911 Malignant neoplasm of unspecified site of right female breast: Secondary | ICD-10-CM | POA: Insufficient documentation

## 2019-01-29 DIAGNOSIS — M899 Disorder of bone, unspecified: Secondary | ICD-10-CM | POA: Diagnosis not present

## 2019-01-29 LAB — GLUCOSE, CAPILLARY: Glucose-Capillary: 99 mg/dL (ref 70–99)

## 2019-01-29 MED ORDER — FLUDEOXYGLUCOSE F - 18 (FDG) INJECTION
8.0400 | Freq: Once | INTRAVENOUS | Status: AC | PRN
  Administered 2019-01-29: 11:00:00 8.04 via INTRAVENOUS

## 2019-01-31 NOTE — Progress Notes (Signed)
Sara David  Telephone:([phone]) [phone] Fax:([phone]) [phone]  ID: Sara David OB: 1946-01-30  MR#: [id number]  OHY#:[id number]  [name], [name] Hessie Knows, MD as Consulting Physician (Orthopedic Surgery) Murlean Iba, MD (Nephrology)  I connected with [name] on 02/01/19 at  9:45 AM EDT by video enabled telemedicine visit and verified that I am speaking with the correct person using two identifiers.   I discussed the limitations, risks, security and privacy concerns of performing an evaluation and management service by telemedicine and the availability of in-person appointments. I also discussed with the patient that there may be a patient responsible charge related to this service. The patient expressed understanding and agreed to proceed.   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  INTERVAL HISTORY: Patient agreed to video enabled telemedicine visit to discuss her imaging results and laboratory work.  She continues to have chronic back pain, that is unchanged in nature.  Her chest pain has now resolved.  She continues to tolerate Ibrance and letrozole well.  She has no neurologic complaints.  She denies any recent fevers or illnesses.  She denies any chest pain, shortness of breath, cough, or hemoptysis.  She has a fair appetite and denies weight loss.  She denies any nausea, vomiting, constipation, or diarrhea.  She has no melena or hematochezia.  She has no urinary complaints.  Patient offers no further specific complaints today.  REVIEW OF SYSTEMS:   Review of Systems  Constitutional: Negative for fever, malaise/fatigue and weight loss.  Respiratory: Negative.   Negative for cough and shortness of breath.   Cardiovascular: Negative.  Negative for chest pain and leg swelling.  Gastrointestinal: Negative.  Negative for abdominal pain, blood in stool, diarrhea and melena.  Genitourinary: Negative.   Musculoskeletal: Positive for back pain. Negative for joint pain.  Skin: Negative.  Negative for rash.  Neurological: Negative.  Negative for sensory change, focal weakness, weakness and headaches.  Psychiatric/Behavioral: Negative.  The patient is not nervous/anxious.     As per HPI. Otherwise, a complete review of systems is negative.  PAST MEDICAL HISTORY: Past Medical History:  Diagnosis Date   Anxiety    Arthritis    "back" (04/03/2016)   Breast cancer ([place]) 2012    no lumpectomy. Treating with meds- Right   Cancer ([place])    small intestines 2012, gallbladder 2016   Chronic lower back pain    COPD (chronic obstructive pulmonary disease) ([place])    "use inhalers for COPD that dx'd at one time" (04/03/2016)   Family history of adverse reaction to anesthesia    daughter gets nauseated   GERD (gastroesophageal reflux disease)    High cholesterol    Hypertension    Hypothyroidism    Skin cancer    "right neck; mid forehead"    PAST SURGICAL HISTORY: Past Surgical History:  Procedure Laterality Date   ABDOMINAL HERNIA REPAIR     "after they took tumor out of my colon I developed a hernia"   ABDOMINAL HYSTERECTOMY     BACK SURGERY     BREAST BIOPSY Right 2012   positive   BREAST BIOPSY Right 2014   positive   BREAST BIOPSY Right 06/26/2016   US guided - waiting  for pathology   BREAST EXCISIONAL BIOPSY Right 1995   benign   CARPAL TUNNEL RELEASE Left    DILATION AND CURETTAGE OF UTERUS     S/P miscarriage   IR GENERIC HISTORICAL  04/04/2016   IR LUMBAR DISC ASPIRATION W/IMG GUIDE 04/04/2016 [name], MD [facility]INTER[facility]   JOINT REPLACEMENT     LAPAROSCOPIC CHOLECYSTECTOMY  2016   REPLACEMENT TOTAL HIP W/   RESURFACING IMPLANTS Bilateral    SKIN CANCER EXCISION     "right neck; mid forehead"   TOTAL HIP ARTHROPLASTY Right 10/03/2015   Procedure: TOTAL HIP ARTHROPLASTY ANTERIOR APPROACH;  Surgeon: Hessie Knows, MD;  Location: [facility] ORS;  Service: Orthopedics;  Laterality: Right;   TOTAL HIP ARTHROPLASTY Left 07/25/2016   Procedure: TOTAL HIP ARTHROPLASTY ANTERIOR APPROACH;  Surgeon: Hessie Knows, MD;  Location: [facility] ORS;  Service: Orthopedics;  Laterality: Left;   TUBAL LIGATION     TUMOR EXCISION  2012   "small intestine; came from the breast cancer"    FAMILY HISTORY: Reviewed and unchanged. No reported history of breast cancer or other chronic diseases.     ADVANCED DIRECTIVES:    HEALTH MAINTENANCE: Social History   Tobacco Use   Smoking status: Current Every Day Smoker    Packs/day: 1.00    Years: 54.00    Pack years: 54.00    Types: Cigarettes   Smokeless tobacco: Never Used   Tobacco comment: not ready to quit  Substance Use Topics   Alcohol use: No   Drug use: No     Colonoscopy:  PAP:  Bone density:  Lipid panel:  Allergies  Allergen Reactions   Carafate [Sucralfate] Nausea Only   Diclofenac Sodium Nausea Only   Erythromycin Diarrhea and Nausea And Vomiting   Adhesive [Tape] Itching and Rash   Wellbutrin [Bupropion] Itching and Rash    Current Outpatient Medications  Medication Sig Dispense Refill   acetaminophen (TYLENOL) 500 MG tablet Take 1,000 mg by mouth daily as needed for mild pain or fever.      albuterol (VENTOLIN HFA) 108 (90 Base) MCG/ACT inhaler Inhale 2 puffs into the lungs every 4 (four) hours as needed.     cetirizine (ZYRTEC) 10 MG tablet Take 10 mg by mouth daily as needed for allergies. Reported on 10/03/2015     citalopram (CELEXA) 20 MG tablet Take 20 mg by mouth every morning.      dicyclomine (BENTYL) 10 MG capsule Take 10 mg by mouth 3 (three) times daily as needed for spasms.     erythromycin ophthalmic ointment  erythromycin 5 mg/gram (0.5 %) eye ointment  APPLY 0.25 INCH(ES) TO EYELIDS AT NIGHT     Fluticasone-Salmeterol (ADVAIR) 100-50 MCG/DOSE AEPB Inhale 1 puff into the lungs every morning.      IBRANCE 100 MG capsule TAKE 1 CAPSULE (100 MG TOTAL) BY MOUTH DAILY. TAKE WHOLE WITH FOOD. TAKE FOR 21 DAYS ON, 7 DAYS OFF, REPEAT EVERY 28 DAYS. 21 capsule 4   letrozole (FEMARA) 2.5 MG tablet TAKE 1 TABLET EVERY DAY 90 tablet 4   levothyroxine (SYNTHROID, LEVOTHROID) 100 MCG tablet Take 100 mcg by mouth daily before breakfast.      montelukast (SINGULAIR) 10 MG tablet Take 10 mg by mouth at bedtime.      Multiple Vitamins-Minerals (CENTRUM SILVER PO) Take 1 tablet by mouth every morning.     omeprazole (PRILOSEC) 20 MG capsule Take 20 mg by mouth every morning.      pravastatin (PRAVACHOL) 40 MG tablet  Take 40 mg by mouth at bedtime.      triamcinolone cream (KENALOG) 0.1 % APPLY TO AFFECTED AREA TWICE A DAY UNTIL CLEAR     valsartan-hydrochlorothiazide (DIOVAN-HCT) 80-12.5 MG tablet Take 1 tablet by mouth daily.     zolpidem (AMBIEN) 10 MG tablet TAKE 1 TABLET BY MOUTH AT BEDTIME AS NEEDED FOR SLEEP 30 tablet 2   ALPRAZolam (XANAX) 0.5 MG tablet Take by mouth.     cyclobenzaprine (FLEXERIL) 10 MG tablet cyclobenzaprine 10 mg tablet  TAKE 1 TABLET BY MOUTH EVERY 8 HOURS AS NEEDED     diphenoxylate-atropine (LOMOTIL) 2.5-0.025 MG tablet Take 1 tablet by mouth 4 (four) times daily as needed for diarrhea or loose stools. (Patient not taking: Reported on 02/01/2019) 30 tablet 0   FLUTICASONE PROPIONATE EX Apply topically.     gabapentin (NEURONTIN) 300 MG capsule gabapentin 300 mg capsule     HYDROcodone-acetaminophen (NORCO/VICODIN) 5-325 MG tablet hydrocodone 5 mg-acetaminophen 325 mg tablet     meloxicam (MOBIC) 15 MG tablet meloxicam 15 mg tablet  TAKE 1 TABLET(S) EVERY DAY BY ORAL ROUTE WITH MEALS.     ondansetron (ZOFRAN ODT) 4 MG disintegrating tablet Take 1 tablet (4 mg total) by  mouth every 8 (eight) hours as needed for nausea or vomiting. (Patient not taking: Reported on 02/01/2019) 20 tablet 0   oxyCODONE-acetaminophen (PERCOCET) 5-325 MG tablet Take 1 tablet by mouth 2 (two) times daily as needed. (Patient not taking: Reported on 02/01/2019) 20 tablet 0   polyethylene glycol (MIRALAX / GLYCOLAX) packet Take 17 g by mouth daily. (Patient not taking: Reported on 02/01/2019) 14 each 0   prochlorperazine (COMPAZINE) 10 MG tablet Take 1 tablet (10 mg total) by mouth every 6 (six) hours as needed for nausea or vomiting. (Patient not taking: Reported on 02/01/2019) 30 tablet 2   traMADol (ULTRAM) 50 MG tablet Take 50 mg by mouth every 6 (six) hours as needed. for pain  0   No current facility-administered medications for this visit.     OBJECTIVE: There were no vitals filed for this visit.   There is no height or weight on file to calculate BMI.    ECOG FS:0 - Asymptomatic  General: Well-developed, well-nourished, no acute distress. HEENT: Normocephalic, moist mucous membranes. Neuro: Alert, answering all questions appropriately. Cranial nerves grossly intact. Skin: No rashes or petechiae noted. Psych: Normal affect.  LAB RESULTS: Lab Results  Component Value Date   NA 137 01/21/2019   K 4.1 01/21/2019   CL 102 01/21/2019   CO2 25 01/21/2019   GLUCOSE 115 (H) 01/21/2019   BUN 29 (H) 01/21/2019   CREATININE 1.50 (H) 01/21/2019   CALCIUM 9.0 01/21/2019   PROT 7.3 01/21/2019   ALBUMIN 4.1 01/21/2019   AST 22 01/21/2019   ALT 16 01/21/2019   ALKPHOS 78 01/21/2019   BILITOT 0.6 01/21/2019   GFRNONAA 34 (L) 01/21/2019   GFRAA 40 (L) 01/21/2019    Lab Results  Component Value Date   WBC 2.9 (L) 01/21/2019   NEUTROABS 2.1 01/21/2019   HGB 11.0 (L) 01/21/2019   HCT 32.2 (L) 01/21/2019   MCV 107.0 (H) 01/21/2019   PLT 253 01/21/2019    STUDIES: Ct Angio Chest Pe W Or Wo Contrast  Result Date: 01/15/2019 CLINICAL DATA:  Dull pain in chest. History of  breast cancer. Chemotherapy ongoing. EXAM: CT ANGIOGRAPHY CHEST WITH CONTRAST TECHNIQUE: Multidetector CT imaging of the chest was performed using the standard protocol during bolus administration of intravenous  contrast. Multiplanar CT image reconstructions and MIPs were obtained to evaluate the vascular anatomy. CONTRAST:  51mL OMNIPAQUE IOHEXOL 350 MG/ML SOLN COMPARISON:  CT abdomen pelvis 07/25/2018, PET-CT 06/22/2018 FINDINGS: Cardiovascular: No filling defects within the pulmonary arteries to suggest acute pulmonary embolism. No acute findings of the aorta or great vessels. No pericardial fluid. No significant vascular findings. Normal heart size. No pericardial effusion. Mediastinum/Nodes: No axillary or supraclavicular adenopathy. No mediastinal hilar adenopathy. No pericardial effusion. Esophagus normal. Lungs/Pleura: No suspicious pulmonary nodules. Upper Abdomen: Limited view of the liver, kidneys, pancreas are unremarkable. Normal adrenal glands. Musculoskeletal: several sclerotic spine lesions are increased in size. One 1 lytic spine lesion increased in size. For example sclerotic lesion at the T11 vertebral body involving the anterior aspect of vertebral body measuring 17 mm in thickness compared to 9 mm on comparison CT 07/25/2018. Within the thoracic spine, in comparison to the CT portion of the PET CT, T4 vertebral body 11 mm sclerotic lesion increased from 4 mm (image 26/4). Lytic lesion at T8 measuring 11 mm (image 59/4) is new. Additional scattered sclerotic and skeletal lesions again noted. The sclerotic lesions are increased. Increase in sclerosis of the inferior aspect of the sternum. Review of the MIP images confirms the above findings. IMPRESSION: 1. No evidence acute pulmonary embolism. 2. 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 Multiple sclerotic lesions increased in size. At least 1 lytic lesions new from prior. Sclerotic lesions involving sternum which may explain chest pain.  Consider repeat FDG PET scan or MDP bone scan. Electronically Signed   By: Suzy Bouchard M.D.   On: 01/15/2019 17:15   Nm Pet Image Restag (ps) Skull Base To Thigh  Result Date: 01/29/2019 CLINICAL DATA:  Subsequent treatment strategy for right breast cancer. EXAM: NUCLEAR MEDICINE PET SKULL BASE TO THIGH TECHNIQUE: 8.0 mCi F-18 FDG was injected intravenously. Full-ring PET imaging was performed from the skull base to thigh after the radiotracer. CT data was obtained and used for attenuation correction and anatomic localization. Fasting blood glucose: 9 9 mg/dl COMPARISON:  Chest CT 01/15/2019.  PET-CT 06/22/2018 FINDINGS: Mediastinal blood pool activity: SUV max 2.9 Liver activity: SUV max NA NECK: No hypermetabolic lymph nodes in the neck. Incidental CT findings: none CHEST: No hypermetabolic mediastinal or hilar nodes. No suspicious pulmonary nodules on the CT scan. Nodular soft tissue attenuation in the deep aspect of the superior right breast, along the chest wall shows low level hypermetabolism. This is stable comparing back to 06/22/2018 and likely treatment related. Incidental CT findings: Coronary artery calcification is evident. Atherosclerotic calcification is noted in the wall of the thoracic aorta. No suspicious pulmonary nodule or mass. ABDOMEN/PELVIS: No abnormal hypermetabolic activity within the liver, pancreas, adrenal glands, or spleen. No hypermetabolic lymph nodes in the abdomen or pelvis. Incidental CT findings: 5.1 x 1.2 cm chronic fluid collection deep to the midline rectus sheath is stable in the interval in shows no hypermetabolism on PET imaging. This probably represents chronic postsurgical change. There is abdominal aortic atherosclerosis without aneurysm. Left colonic diverticulosis evident. SKELETON: Multiple hypermetabolic bone lesions are evident. The known hypermetabolic sacral lesion demonstrates SUV max = 12.5 today compared to 10.4 previously. There is a new hypermetabolic  lesion ( SUV max = 4.1) in the posterior left iliac bone corresponding to a subtle lucent lesion on image 716/9. hypermetabolic lesion in the C78 vertebral body demonstrates SUV max = 3.7 today compared to 2.8 previously sternal lesion demonstrates SUV max = 3.4 today compared to 2.4 previously  multiple hypermetabolic rib and thoracolumbar spine lesion show similar slight increase in FDG accumulation. Incidental CT findings: Fairly wide spread sclerotic bone metastases evident with some sclerotic lesions showing no hypermetabolism compatible with treated disease. IMPRESSION: 1. Scattered hypermetabolic bone lesions in the sternum, ribs, spine, and bony pelvis. Lytic sacral lesion is more hypermetabolic today and the remaining bone lesions appear stable to slightly increased in FDG accumulation. There is a new lytic hypermetabolic lesion in the posterior left iliac bone. 2. No evidence for hypermetabolic lymphadenopathy or soft tissue metastases in the chest, abdomen, or pelvis. Electronically Signed   By: Misty Stanley M.D.   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  PLAN:    1.  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  Repeat PET scan on January 29, 2019 reviewed independently and reported as above with only mild increase in FDG accumulation consistent with a slowly increasing CA 27-29.  She was also noted to have a new lytic hypermetabolic lesion in her left iliac bone.  Unclear if her recent chest pain was related to her known sternal lesion.  Her most recent CA 27-29 is 58.1.  She continues to have persistent pancytopenia, but this is chronic and unchanged.  Despite mild progression of disease, patient remains relatively  asymptomatic and is not interested in pursuing aggressive chemotherapy at this time.  Will continue with dose reduced Ibrance to 100 mg every 21 days with 7 days off along with daily letrozole.  Patient has been instructed to keep her previously scheduled follow-up appointment at the end of July. 2.  Hip/back pain: Chronic.  Patient is status post bilateral hip replacement. 3.  Anemia: Patient's hemoglobin is decreased, but stable at 11.0. 4.  Renal insufficiency: Patient's creatinine is 1.50 which is slightly above her baseline, monitor.   5.  Leukopenia: Chronic and unchanged.  Decreased secondary to Ibrance.  Continue with dose reduced treatment as above. 6.  Thrombocytopenia: Resolved.   7.  Diarrhea: Patient does not complain of this today.  Continue OTC Imodium as needed. 8.  Bony lesions: Consider reintroducing Zometa in the near future.  I provided 25 minutes of face-to-face video visit time during this encounter, and > 50% was spent counseling as documented under my assessment & plan.   Patient expressed understanding and was in agreement with this plan. She also understands that She can call clinic at any time with any questions, concerns, or complaints.    Lloyd Huger, MD   02/01/2019 3:22 PM

## 2019-02-01 ENCOUNTER — Inpatient Hospital Stay (HOSPITAL_BASED_OUTPATIENT_CLINIC_OR_DEPARTMENT_OTHER): Payer: Medicare Other | Admitting: Oncology

## 2019-02-01 ENCOUNTER — Encounter: Payer: Self-pay | Admitting: Oncology

## 2019-02-01 ENCOUNTER — Other Ambulatory Visit: Payer: Self-pay

## 2019-02-01 DIAGNOSIS — Z79811 Long term (current) use of aromatase inhibitors: Secondary | ICD-10-CM | POA: Diagnosis not present

## 2019-02-01 DIAGNOSIS — Z170 Estrogen receptor positive status [ER+]: Secondary | ICD-10-CM

## 2019-02-01 DIAGNOSIS — E039 Hypothyroidism, unspecified: Secondary | ICD-10-CM

## 2019-02-01 DIAGNOSIS — F17210 Nicotine dependence, cigarettes, uncomplicated: Secondary | ICD-10-CM

## 2019-02-01 DIAGNOSIS — C50911 Malignant neoplasm of unspecified site of right female breast: Secondary | ICD-10-CM

## 2019-02-01 DIAGNOSIS — I10 Essential (primary) hypertension: Secondary | ICD-10-CM

## 2019-02-01 DIAGNOSIS — Z79899 Other long term (current) drug therapy: Secondary | ICD-10-CM

## 2019-02-01 NOTE — Progress Notes (Signed)
Patient stated that she had been doing well with no complaints. Patient would like to know her PET Scan results. Patient denied fever, chills, nausea, vomiting, pain on her breasts, nipple discharge or skin discoloration.

## 2019-02-03 MED FILL — IBRANCE 100 MG CAPSULE: 100 | 28 days supply | Qty: 21 | Fill #3

## 2019-02-15 ENCOUNTER — Other Ambulatory Visit: Payer: Self-pay | Admitting: Pharmacist

## 2019-02-15 DIAGNOSIS — C50919 Malignant neoplasm of unspecified site of unspecified female breast: Secondary | ICD-10-CM

## 2019-02-15 MED ORDER — PALBOCICLIB 100 MG PO TABS: 100.0000 mg | ORAL_TABLET | Freq: Every day | ORAL | 4 refills | Status: DC

## 2019-02-15 NOTE — Progress Notes (Signed)
Prescription for Ibrance TABLETS sent in, capsule rx suspended.

## 2019-03-02 MED FILL — IBRANCE 100 MG TABS: 100 | 28 days supply | Qty: 21 | Fill #0

## 2019-03-14 NOTE — Progress Notes (Signed)
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  INTERVAL HISTORY: Patient returns to clinic today for further evaluation and continuation of Ibrance.  She currently feels well and is asymptomatic.  She continues to tolerate her treatments well without significant side effects.  She has no neurologic complaints.  She denies any recent fevers or illnesses.  She denies any chest pain, shortness of breath, cough, or hemoptysis.  She has a fair appetite and denies weight loss.  She denies any nausea, vomiting, constipation, or diarrhea.  She has no melena or hematochezia.  She has no urinary complaints.  Patient feels that her baseline offers no specific complaints today.  REVIEW OF SYSTEMS:   Review of Systems  Constitutional: Negative for fever, malaise/fatigue and weight loss.  Respiratory: Negative.  Negative for cough and shortness of breath.   Cardiovascular: Negative.  Negative for chest pain and leg swelling.  Gastrointestinal: Negative.  Negative for abdominal pain, blood in stool, diarrhea and melena.  Genitourinary: Negative.   Musculoskeletal: Positive for back pain. Negative for joint pain.  Skin: Negative.  Negative for rash.  Neurological: Negative.  Negative for sensory change, focal weakness, weakness and headaches.  Psychiatric/Behavioral: Negative.  The patient is not nervous/anxious.     As per HPI. Otherwise, a complete review of systems is negative.  PAST MEDICAL HISTORY: Past Medical History:  Diagnosis Date  . Anxiety   . Arthritis    "back" (04/03/2016)  .  Breast cancer ([place]) 2012    no lumpectomy. Treating with meds- Right  . Cancer ([place])    small intestines 2012, gallbladder 2016  . Chronic lower back pain   . COPD (chronic obstructive pulmonary disease) ([place])    "use inhalers for COPD that dx'd at one time" (04/03/2016)  . Family history of adverse reaction to anesthesia    daughter gets nauseated  . GERD (gastroesophageal reflux disease)   . High cholesterol   . Hypertension   . Hypothyroidism   . Skin cancer    "right neck; mid forehead"    PAST SURGICAL HISTORY: Past Surgical History:  Procedure Laterality Date  . ABDOMINAL HERNIA REPAIR     "after they took tumor out of my colon I developed a hernia"  . ABDOMINAL HYSTERECTOMY    . BACK SURGERY    . BREAST BIOPSY Right 2012   positive  . BREAST BIOPSY Right 2014   positive  . BREAST BIOPSY Right 06/26/2016   US guided - waiting for pathology  . BREAST EXCISIONAL BIOPSY Right 1995   benign  . CARPAL TUNNEL RELEASE Left   . DILATION AND CURETTAGE OF UTERUS     S/P miscarriage  . IR GENERIC HISTORICAL  04/04/2016   IR LUMBAR DISC ASPIRATION W/IMG GUIDE 04/04/2016 [name], MD [facility]  . JOINT REPLACEMENT    . LAPAROSCOPIC CHOLECYSTECTOMY  2016  . REPLACEMENT TOTAL HIP W/  RESURFACING IMPLANTS Bilateral   . SKIN CANCER EXCISION     "right neck; mid forehead"  . TOTAL HIP ARTHROPLASTY Right 10/03/2015   Procedure: TOTAL HIP ARTHROPLASTY ANTERIOR APPROACH;  Surgeon: Hessie Knows, MD;  Location: [facility] ORS;  Service: Orthopedics;  Laterality: Right;  . TOTAL HIP ARTHROPLASTY Left 07/25/2016   Procedure: TOTAL HIP ARTHROPLASTY ANTERIOR APPROACH;  Surgeon: Hessie Knows, MD;  Location: [facility] ORS;  Service: Orthopedics;  Laterality: Left;  . TUBAL LIGATION    . TUMOR EXCISION  2012   "small intestine; came from the breast cancer"    FAMILY HISTORY: Reviewed and unchanged. No reported history of breast cancer or other chronic diseases.     ADVANCED DIRECTIVES:     HEALTH MAINTENANCE: Social History   Tobacco Use  . Smoking status: Current Every Day Smoker    Packs/day: 1.00    Years: 54.00    Pack years: 54.00    Types: Cigarettes  . Smokeless tobacco: Never Used  . Tobacco comment: not ready to quit  Substance Use Topics  . Alcohol use: No  . Drug use: No     Colonoscopy:  PAP:  Bone density:  Lipid panel:  Allergies  Allergen Reactions  . Carafate [Sucralfate] Nausea Only  . Diclofenac Sodium Nausea Only  . Erythromycin Diarrhea and Nausea And Vomiting  . Adhesive [Tape] Itching and Rash  . Wellbutrin [Bupropion] Itching and Rash    Current Outpatient Medications  Medication Sig Dispense Refill  . acetaminophen (TYLENOL) 500 MG tablet Take 1,000 mg by mouth daily as needed for mild pain or fever.     Marland Kitchen albuterol (VENTOLIN HFA) 108 (90 Base) MCG/ACT inhaler Inhale 2 puffs into the lungs every 4 (four) hours as needed.    . cetirizine (ZYRTEC) 10 MG tablet Take 10 mg by mouth daily as needed for allergies. Reported on 10/03/2015    . citalopram (CELEXA) 20 MG tablet Take 20 mg by mouth every morning.     . dicyclomine (BENTYL) 10 MG capsule Take 10 mg by mouth 3 (three) times daily as needed for spasms.    Marland Kitchen erythromycin ophthalmic ointment erythromycin 5 mg/gram (0.5 %) eye ointment  APPLY 0.25 INCH(ES) TO EYELIDS AT NIGHT    . Fluticasone-Salmeterol (ADVAIR) 100-50 MCG/DOSE AEPB Inhale 1 puff into the lungs every morning.     Marland Kitchen letrozole (FEMARA) 2.5 MG tablet TAKE 1 TABLET EVERY DAY 90 tablet 4  . levothyroxine (SYNTHROID, LEVOTHROID) 100 MCG tablet Take 100 mcg by mouth daily before breakfast.     . montelukast (SINGULAIR) 10 MG tablet Take 10 mg by mouth at bedtime.     . Multiple Vitamins-Minerals (CENTRUM SILVER PO) Take 1 tablet by mouth every morning.    Marland Kitchen omeprazole (PRILOSEC) 20 MG capsule Take 20 mg by mouth every morning.     . ondansetron (ZOFRAN ODT) 4 MG disintegrating tablet Take 1 tablet (4 mg total) by mouth  every 8 (eight) hours as needed for nausea or vomiting. 20 tablet 0  . palbociclib (IBRANCE) 100 MG tablet Take 1 tablet (100 mg total) by mouth daily. Take for 21 days on, 7 days off, repeat every 28 days. 21 tablet 4  . pravastatin (PRAVACHOL) 40 MG tablet Take 40 mg by mouth at bedtime.     . triamcinolone cream (KENALOG) 0.1 % APPLY TO AFFECTED AREA TWICE A DAY UNTIL CLEAR    . valsartan-hydrochlorothiazide (DIOVAN-HCT) 80-12.5 MG tablet Take 1 tablet by mouth daily.    Marland Kitchen zolpidem (AMBIEN) 10 MG tablet TAKE 1 TABLET BY MOUTH AT BEDTIME AS NEEDED FOR SLEEP 30 tablet 2   No current facility-administered medications for this visit.     OBJECTIVE: Vitals:   03/18/19 1440  BP: 114/71  Pulse: 71  Temp: 98.5 F (36.9 C)     Body mass index is 24.13 kg/m.    ECOG FS:0 - Asymptomatic  General: Well-developed, well-nourished, no acute distress. Eyes: Pink conjunctiva, anicteric sclera. HEENT: Normocephalic, moist mucous membranes. Lungs: Clear to auscultation bilaterally. Heart: Regular rate and rhythm. No rubs, murmurs, or gallops. Abdomen: Soft, nontender, nondistended. No organomegaly noted, normoactive bowel sounds. Musculoskeletal: No edema, cyanosis, or clubbing. Neuro: Alert, answering all questions appropriately. Cranial nerves grossly intact. Skin: No rashes or petechiae noted. Psych: Normal affect.  LAB RESULTS: Lab Results  Component Value Date   NA 139 03/15/2019   K 3.8 03/15/2019   CL 105 03/15/2019   CO2 24 03/15/2019   GLUCOSE 145 (H) 03/15/2019   BUN 30 (H) 03/15/2019   CREATININE 1.46 (H) 03/15/2019   CALCIUM 9.0 03/15/2019   PROT 7.2 03/15/2019   ALBUMIN 4.2 03/15/2019   AST 26 03/15/2019   ALT 18 03/15/2019   ALKPHOS 80 03/15/2019   BILITOT 0.5 03/15/2019   GFRNONAA 35 (L) 03/15/2019   GFRAA 41 (L) 03/15/2019    Lab Results  Component Value Date   WBC 3.2 (L) 03/15/2019   NEUTROABS 2.3 03/15/2019   HGB 10.5 (L) 03/15/2019   HCT 30.9 (L)  03/15/2019   MCV 107.7 (H) 03/15/2019   PLT 220 03/15/2019    STUDIES: No results found.  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  PLAN:    1.  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  Repeat PET scan on January 29, 2019 reviewed independently and reported as above with only mild increase in FDG accumulation consistent with a slowly increasing CA 27-29.  She was also noted to have a new lytic hypermetabolic lesion in her left iliac bone.  Her most recent CA 27-29 has trended down slightly and is now 47.6.  She continues to have persistent pancytopenia, but this is chronic and unchanged.  Despite mild progression of disease, patient remains relatively asymptomatic and is not interested in pursuing aggressive chemotherapy at this time.  Will continue with dose reduced Ibrance to 100 mg every 21 days with 7 days off along with daily letrozole.  Return to clinic in 6 weeks for laboratory work only and then in 3 months for laboratory work and further evaluation. 2.  Hip/back pain: Chronic.  Patient is status post bilateral hip replacement. 3.  Anemia: Patient's hemoglobin has trended down slightly to 10.5.  Monitor. 4.  Renal insufficiency: Patient creatinine is 1.46, monitor.   5.  Leukopenia: Chronic and unchanged.  Decreased secondary to Ibrance.  Continue with dose reduced treatment as above. 6.  Thrombocytopenia: Resolved.   7.  Diarrhea: Patient does not complain of this today.  Continue OTC Imodium as needed. 8.  Bony lesions: Consider reintroducing Zometa in the near future.  I spent a total of 30 minutes face-to-face with the patient of which greater than 50% of the visit was spent in counseling and coordination of care as detailed  above.   Patient expressed understanding and was in agreement with this plan. She also understands that She can call clinic at any time with any questions, concerns, or complaints.    Lloyd Huger, MD   03/19/2019 7:10 AM

## 2019-03-15 ENCOUNTER — Inpatient Hospital Stay: Payer: Medicare Other | Attending: Oncology

## 2019-03-15 ENCOUNTER — Other Ambulatory Visit: Payer: Self-pay

## 2019-03-15 DIAGNOSIS — Z96643 Presence of artificial hip joint, bilateral: Secondary | ICD-10-CM | POA: Diagnosis not present

## 2019-03-15 DIAGNOSIS — C784 Secondary malignant neoplasm of small intestine: Secondary | ICD-10-CM | POA: Diagnosis not present

## 2019-03-15 DIAGNOSIS — D61818 Other pancytopenia: Secondary | ICD-10-CM | POA: Diagnosis not present

## 2019-03-15 DIAGNOSIS — C7989 Secondary malignant neoplasm of other specified sites: Secondary | ICD-10-CM | POA: Insufficient documentation

## 2019-03-15 DIAGNOSIS — C7951 Secondary malignant neoplasm of bone: Secondary | ICD-10-CM | POA: Diagnosis not present

## 2019-03-15 DIAGNOSIS — M545 Low back pain: Secondary | ICD-10-CM | POA: Diagnosis not present

## 2019-03-15 DIAGNOSIS — C50911 Malignant neoplasm of unspecified site of right female breast: Secondary | ICD-10-CM

## 2019-03-15 DIAGNOSIS — D649 Anemia, unspecified: Secondary | ICD-10-CM | POA: Diagnosis not present

## 2019-03-15 DIAGNOSIS — N289 Disorder of kidney and ureter, unspecified: Secondary | ICD-10-CM | POA: Diagnosis not present

## 2019-03-15 DIAGNOSIS — Z720 Tobacco use: Secondary | ICD-10-CM | POA: Diagnosis not present

## 2019-03-15 DIAGNOSIS — C50411 Malignant neoplasm of upper-outer quadrant of right female breast: Secondary | ICD-10-CM | POA: Diagnosis not present

## 2019-03-15 LAB — COMPREHENSIVE METABOLIC PANEL
ALT: 18 U/L (ref 0–44)
AST: 26 U/L (ref 15–41)
Albumin: 4.2 g/dL (ref 3.5–5.0)
Alkaline Phosphatase: 80 U/L (ref 38–126)
Anion gap: 10 (ref 5–15)
BUN: 30 mg/dL — ABNORMAL HIGH (ref 8–23)
CO2: 24 mmol/L (ref 22–32)
Calcium: 9 mg/dL (ref 8.9–10.3)
Chloride: 105 mmol/L (ref 98–111)
Creatinine, Ser: 1.46 mg/dL — ABNORMAL HIGH (ref 0.44–1.00)
GFR calc Af Amer: 41 mL/min — ABNORMAL LOW (ref >60)
GFR calc non Af Amer: 35 mL/min — ABNORMAL LOW (ref >60)
Glucose, Bld: 145 mg/dL — ABNORMAL HIGH (ref 70–99)
Potassium: 3.8 mmol/L (ref 3.5–5.1)
Sodium: 139 mmol/L (ref 135–145)
Total Bilirubin: 0.5 mg/dL (ref 0.3–1.2)
Total Protein: 7.2 g/dL (ref 6.5–8.1)

## 2019-03-15 LAB — CBC WITH DIFFERENTIAL/PLATELET
Abs Immature Granulocytes: 0.03 10*3/uL (ref 0.00–0.07)
Basophils Absolute: 0.1 10*3/uL (ref 0.0–0.1)
Basophils Relative: 3 %
Eosinophils Absolute: 0 10*3/uL (ref 0.0–0.5)
Eosinophils Relative: 1 %
HCT: 30.9 % — ABNORMAL LOW (ref 36.0–46.0)
Hemoglobin: 10.5 g/dL — ABNORMAL LOW (ref 12.0–15.0)
Immature Granulocytes: 1 %
Lymphocytes Relative: 18 %
Lymphs Abs: 0.6 10*3/uL — ABNORMAL LOW (ref 0.7–4.0)
MCH: 36.6 pg — ABNORMAL HIGH (ref 26.0–34.0)
MCHC: 34 g/dL (ref 30.0–36.0)
MCV: 107.7 fL — ABNORMAL HIGH (ref 80.0–100.0)
Monocytes Absolute: 0.2 10*3/uL (ref 0.1–1.0)
Monocytes Relative: 6 %
Neutro Abs: 2.3 10*3/uL (ref 1.7–7.7)
Neutrophils Relative %: 71 %
Platelets: 220 10*3/uL (ref 150–400)
RBC: 2.87 MIL/uL — ABNORMAL LOW (ref 3.87–5.11)
RDW: 13.3 % (ref 11.5–15.5)
WBC: 3.2 10*3/uL — ABNORMAL LOW (ref 4.0–10.5)
nRBC: 0 % (ref 0.0–0.2)

## 2019-03-16 LAB — CANCER ANTIGEN 27.29: CA 27.29: 47.6 U/mL — ABNORMAL HIGH (ref 0.0–38.6)

## 2019-03-17 ENCOUNTER — Other Ambulatory Visit: Payer: Self-pay

## 2019-03-18 ENCOUNTER — Inpatient Hospital Stay (HOSPITAL_BASED_OUTPATIENT_CLINIC_OR_DEPARTMENT_OTHER): Payer: Medicare Other | Admitting: Oncology

## 2019-03-18 ENCOUNTER — Other Ambulatory Visit: Payer: Medicare Other

## 2019-03-18 ENCOUNTER — Encounter: Payer: Self-pay | Admitting: Oncology

## 2019-03-18 ENCOUNTER — Other Ambulatory Visit: Payer: Self-pay

## 2019-03-18 VITALS — BP 114/71 | HR 71 | Temp 98.5°F | Ht 64.0 in | Wt 140.6 lb

## 2019-03-18 DIAGNOSIS — C7951 Secondary malignant neoplasm of bone: Secondary | ICD-10-CM | POA: Diagnosis not present

## 2019-03-18 DIAGNOSIS — M899 Disorder of bone, unspecified: Secondary | ICD-10-CM

## 2019-03-18 DIAGNOSIS — M545 Low back pain: Secondary | ICD-10-CM

## 2019-03-18 DIAGNOSIS — C7989 Secondary malignant neoplasm of other specified sites: Secondary | ICD-10-CM | POA: Diagnosis not present

## 2019-03-18 DIAGNOSIS — C50411 Malignant neoplasm of upper-outer quadrant of right female breast: Secondary | ICD-10-CM | POA: Diagnosis not present

## 2019-03-18 DIAGNOSIS — C784 Secondary malignant neoplasm of small intestine: Secondary | ICD-10-CM | POA: Diagnosis not present

## 2019-03-18 DIAGNOSIS — N289 Disorder of kidney and ureter, unspecified: Secondary | ICD-10-CM

## 2019-03-18 DIAGNOSIS — D61818 Other pancytopenia: Secondary | ICD-10-CM

## 2019-03-18 DIAGNOSIS — D72819 Decreased white blood cell count, unspecified: Secondary | ICD-10-CM

## 2019-03-18 DIAGNOSIS — C50911 Malignant neoplasm of unspecified site of right female breast: Secondary | ICD-10-CM

## 2019-03-18 DIAGNOSIS — D649 Anemia, unspecified: Secondary | ICD-10-CM

## 2019-03-29 MED FILL — IBRANCE 100 MG TABS: 100 | 28 days supply | Qty: 21 | Fill #1

## 2019-04-11 ENCOUNTER — Other Ambulatory Visit: Payer: Self-pay | Admitting: Oncology

## 2019-04-11 DIAGNOSIS — C801 Malignant (primary) neoplasm, unspecified: Secondary | ICD-10-CM

## 2019-04-11 DIAGNOSIS — C50911 Malignant neoplasm of unspecified site of right female breast: Secondary | ICD-10-CM

## 2019-04-21 ENCOUNTER — Telehealth: Payer: Self-pay | Admitting: *Deleted

## 2019-04-21 NOTE — Telephone Encounter (Signed)
Patient called asking if we could check her Magnesium and potassium levels when she comes in for lab apt on 9/10 as she is having hand and leg cramps. Please advise

## 2019-04-21 NOTE — Telephone Encounter (Signed)
Called patient back to let her know that Dr. Grayland Ormond is recommending OTC Potassium first and that if it doesn't get any better, to let us know. Patient agreed and had no further questions.

## 2019-04-27 ENCOUNTER — Other Ambulatory Visit: Payer: Self-pay | Admitting: *Deleted

## 2019-04-27 MED ORDER — LETROZOLE 2.5 MG PO TABS: 2.5000 mg | ORAL_TABLET | Freq: Every day | ORAL | 4 refills | Status: DC

## 2019-04-29 ENCOUNTER — Other Ambulatory Visit: Payer: Self-pay

## 2019-04-29 ENCOUNTER — Inpatient Hospital Stay: Payer: Medicare Other | Attending: Oncology

## 2019-04-29 DIAGNOSIS — C784 Secondary malignant neoplasm of small intestine: Secondary | ICD-10-CM | POA: Insufficient documentation

## 2019-04-29 DIAGNOSIS — C50919 Malignant neoplasm of unspecified site of unspecified female breast: Secondary | ICD-10-CM

## 2019-04-29 DIAGNOSIS — C7989 Secondary malignant neoplasm of other specified sites: Secondary | ICD-10-CM | POA: Insufficient documentation

## 2019-04-29 DIAGNOSIS — C50411 Malignant neoplasm of upper-outer quadrant of right female breast: Secondary | ICD-10-CM | POA: Insufficient documentation

## 2019-04-29 DIAGNOSIS — C7951 Secondary malignant neoplasm of bone: Secondary | ICD-10-CM | POA: Insufficient documentation

## 2019-04-29 LAB — CBC WITH DIFFERENTIAL/PLATELET
Abs Immature Granulocytes: 0.01 10*3/uL (ref 0.00–0.07)
Basophils Absolute: 0.1 10*3/uL (ref 0.0–0.1)
Basophils Relative: 2 %
Eosinophils Absolute: 0 10*3/uL (ref 0.0–0.5)
Eosinophils Relative: 0 %
HCT: 32 % — ABNORMAL LOW (ref 36.0–46.0)
Hemoglobin: 10.9 g/dL — ABNORMAL LOW (ref 12.0–15.0)
Immature Granulocytes: 0 %
Lymphocytes Relative: 23 %
Lymphs Abs: 0.6 10*3/uL — ABNORMAL LOW (ref 0.7–4.0)
MCH: 36.3 pg — ABNORMAL HIGH (ref 26.0–34.0)
MCHC: 34.1 g/dL (ref 30.0–36.0)
MCV: 106.7 fL — ABNORMAL HIGH (ref 80.0–100.0)
Monocytes Absolute: 0.2 10*3/uL (ref 0.1–1.0)
Monocytes Relative: 8 %
Neutro Abs: 1.7 10*3/uL (ref 1.7–7.7)
Neutrophils Relative %: 67 %
Platelets: 108 10*3/uL — ABNORMAL LOW (ref 150–400)
RBC: 3 MIL/uL — ABNORMAL LOW (ref 3.87–5.11)
RDW: 13.4 % (ref 11.5–15.5)
WBC: 2.6 10*3/uL — ABNORMAL LOW (ref 4.0–10.5)
nRBC: 0 % (ref 0.0–0.2)

## 2019-04-29 LAB — COMPREHENSIVE METABOLIC PANEL
ALT: 16 U/L (ref 0–44)
AST: 24 U/L (ref 15–41)
Albumin: 4.5 g/dL (ref 3.5–5.0)
Alkaline Phosphatase: 78 U/L (ref 38–126)
Anion gap: 11 (ref 5–15)
BUN: 28 mg/dL — ABNORMAL HIGH (ref 8–23)
CO2: 25 mmol/L (ref 22–32)
Calcium: 9.2 mg/dL (ref 8.9–10.3)
Chloride: 102 mmol/L (ref 98–111)
Creatinine, Ser: 1.3 mg/dL — ABNORMAL HIGH (ref 0.44–1.00)
GFR calc Af Amer: 47 mL/min — ABNORMAL LOW (ref >60)
GFR calc non Af Amer: 41 mL/min — ABNORMAL LOW (ref >60)
Glucose, Bld: 142 mg/dL — ABNORMAL HIGH (ref 70–99)
Potassium: 3.8 mmol/L (ref 3.5–5.1)
Sodium: 138 mmol/L (ref 135–145)
Total Bilirubin: 0.5 mg/dL (ref 0.3–1.2)
Total Protein: 7 g/dL (ref 6.5–8.1)

## 2019-04-29 MED FILL — IBRANCE 100 MG TABS: 100 | 28 days supply | Qty: 21 | Fill #2

## 2019-04-30 LAB — CANCER ANTIGEN 27.29: CA 27.29: 55.3 U/mL — ABNORMAL HIGH (ref 0.0–38.6)

## 2019-05-22 MED FILL — IBRANCE 100 MG TABS: 100 | 28 days supply | Qty: 21 | Fill #3

## 2019-06-03 ENCOUNTER — Other Ambulatory Visit (INDEPENDENT_AMBULATORY_CARE_PROVIDER_SITE_OTHER): Payer: Self-pay | Admitting: Vascular Surgery

## 2019-06-03 DIAGNOSIS — R230 Cyanosis: Secondary | ICD-10-CM

## 2019-06-08 ENCOUNTER — Ambulatory Visit (INDEPENDENT_AMBULATORY_CARE_PROVIDER_SITE_OTHER): Payer: Medicare Other | Admitting: Vascular Surgery

## 2019-06-08 ENCOUNTER — Other Ambulatory Visit: Payer: Self-pay

## 2019-06-08 ENCOUNTER — Encounter (INDEPENDENT_AMBULATORY_CARE_PROVIDER_SITE_OTHER): Payer: Self-pay | Admitting: Vascular Surgery

## 2019-06-08 ENCOUNTER — Ambulatory Visit (INDEPENDENT_AMBULATORY_CARE_PROVIDER_SITE_OTHER): Payer: Medicare Other

## 2019-06-08 VITALS — BP 118/72 | HR 65 | Resp 10 | Ht 64.0 in | Wt 139.0 lb

## 2019-06-08 DIAGNOSIS — I7300 Raynaud's syndrome without gangrene: Secondary | ICD-10-CM | POA: Diagnosis not present

## 2019-06-08 DIAGNOSIS — I10 Essential (primary) hypertension: Secondary | ICD-10-CM | POA: Diagnosis not present

## 2019-06-08 DIAGNOSIS — R230 Cyanosis: Secondary | ICD-10-CM

## 2019-06-08 DIAGNOSIS — E782 Mixed hyperlipidemia: Secondary | ICD-10-CM

## 2019-06-08 NOTE — Patient Instructions (Signed)
Raynaud Phenomenon ° °Raynaud phenomenon is a condition that affects the blood vessels (arteries) that carry blood to your fingers and toes. The arteries that supply blood to your ears, lips, nipples, or the tip of your nose might also be affected. Raynaud phenomenon causes the arteries to become narrow temporarily (spasm). As a result, the flow of blood to the affected areas is temporarily decreased. This usually occurs in response to cold temperatures or stress. During an attack, the skin in the affected areas turns white, then blue, and finally red. You may also feel tingling or numbness in those areas. °Attacks usually last for only a brief period, and then the blood flow to the area returns to normal. In most cases, Raynaud phenomenon does not cause serious health problems. °What are the causes? °In many cases, the cause of this condition is not known. The condition may occur on its own (primary Raynaud phenomenon) or may be associated with other diseases or factors (secondary Raynaud phenomenon). °Possible causes may include: °· Diseases or medical conditions that damage the arteries. °· Injuries and repetitive actions that hurt the hands or feet. °· Being exposed to certain chemicals. °· Taking medicines that narrow the arteries. °· Other medical conditions, such as lupus, scleroderma, rheumatoid arthritis, thyroid problems, blood disorders, Sjogren syndrome, or atherosclerosis. °What increases the risk? °The following factors may make you more likely to develop this condition: °· Being 20-40 years old. °· Being female. °· Having a family history of Raynaud phenomenon. °· Living in a cold climate. °· Smoking. °What are the signs or symptoms? °Symptoms of this condition usually occur when you are exposed to cold temperatures or when you have emotional stress. The symptoms may last for a few minutes or up to several hours. They usually affect your fingers but may also affect your toes, nipples, lips, ears, or  the tip of your nose. Symptoms may include: °· Changes in skin color. The skin in the affected areas will turn pale or white. The skin may then change from white to bluish to red as normal blood flow returns to the area. °· Numbness, tingling, or pain in the affected areas. °In severe cases, symptoms may include: °· Skin sores. °· Tissues decaying and dying (gangrene). °How is this diagnosed? °This condition may be diagnosed based on: °· Your symptoms and medical history. °· A physical exam. During the exam, you may be asked to put your hands in cold water to check for a reaction to cold temperature. °· Tests, such as: °? Blood tests to check for other diseases or conditions. °? A test to check the movement of blood through your arteries and veins (vascular ultrasound). °? A test in which the skin at the base of your fingernail is examined under a microscope (nailfold capillaroscopy). °How is this treated? °Treatment for this condition often involves making lifestyle changes and taking steps to control your exposure to cold temperatures. For more severe cases, medicine (calcium channel blockers) may be used to improve blood flow. Surgery is sometimes done to block the nerves that control the affected arteries, but this is rare. °Follow these instructions at home: °Avoiding cold temperatures °Take these steps to avoid exposure to cold: °· If possible, stay indoors during cold weather. °· When you go outside during cold weather, dress in layers and wear mittens, a hat, a scarf, and warm footwear. °· Wear mittens or gloves when handling ice or frozen food. °· Use holders for glasses or cans containing cold drinks. °·   Let warm water run for a while before taking a shower or bath. °· Warm up the car before driving in cold weather. °Lifestyle ° °· If possible, avoid stressful and emotional situations. Try to find ways to manage your stress, such as: °? Exercise. °? Yoga. °? Meditation. °? Biofeedback. °· Do not use any  products that contain nicotine or tobacco, such as cigarettes and e-cigarettes. If you need help quitting, ask your health care provider. °· Avoid secondhand smoke. °· Limit your use of caffeine. °? Switch to decaffeinated coffee, tea, and soda. °? Avoid chocolate. °· Avoid vibrating tools and machinery. °General instructions °· Protect your hands and feet from injuries, cuts, or bruises. °· Avoid wearing tight rings or wristbands. °· Wear loose fitting socks and comfortable, roomy shoes. °· Take over-the-counter and prescription medicines only as told by your health care provider. °Contact a health care provider if: °· Your discomfort becomes worse despite lifestyle changes. °· You develop sores on your fingers or toes that do not heal. °· Your fingers or toes turn black. °· You have breaks in the skin on your fingers or toes. °· You have a fever. °· You have pain or swelling in your joints. °· You have a rash. °· Your symptoms occur on only one side of your body. °Summary °· Raynaud phenomenon is a condition that affects the arteries that carry blood to your fingers, toes, ears, lips, nipples, or the tip of your nose. °· In many cases, the cause of this condition is not known. °· Symptoms of this condition include changes in skin color, and numbness and tingling of the affected area. °· Treatment for this condition includes lifestyle changes, reducing exposure to cold temperatures, and using medicines for severe cases of the condition. °· Contact your health care provider if your condition worsens despite treatment. °This information is not intended to replace advice given to you by your health care provider. Make sure you discuss any questions you have with your health care provider. °Document Released: 08/02/2000 Document Revised: 08/08/2017 Document Reviewed: 09/16/2016 °Elsevier Patient Education © 2020 [employer] ° °

## 2019-06-08 NOTE — Assessment & Plan Note (Signed)
blood pressure control important in reducing the progression of atherosclerotic disease. On appropriate oral medications.  

## 2019-06-08 NOTE — Assessment & Plan Note (Signed)
lipid control important in reducing the progression of atherosclerotic disease. Continue statin therapy  

## 2019-06-08 NOTE — Progress Notes (Signed)
Patient ID: Sara David, female   DOB: 1945/10/30, 73 y.o.   MRN: [id number]  Chief Complaint  Patient presents with  . New Patient (Initial Visit)    HPI Andrew Cislo is a 73 y.o. female.  I am asked to see the patient by Dr. Elvina Mattes for evaluation of her arterial perfusion due to cyanosis of her toes.  She says this actually affects her hands more than her toes.  The hands and toes turn purple, cold, and are painful intermittently.  She cannot tell exactly when this seems to happen but it is bothering her more now than it was a few months ago when the weather was warm.  Cold stimuli do not always elicit this effect, but that does seem to contribute.  Both hands and both feet are affected.  There is no trauma, injury, or inciting event that started the symptoms.  She does report this seems to be gradually worsening over time. To assess her for peripheral arterial disease today, noninvasive studies were performed.  She has normal ABIs of 1.0 bilaterally with triphasic waveforms.  Her digital pressures are a little bit blunted at 85 on the right and 62 on the left.     Past Medical History:  Diagnosis Date  . Anxiety   . Arthritis    "back" (04/03/2016)  . Breast cancer ([place]) 2012    no lumpectomy. Treating with meds- Right  . Cancer ([place])    small intestines 2012, gallbladder 2016  . Chronic lower back pain   . COPD (chronic obstructive pulmonary disease) ([place])    "use inhalers for COPD that dx'd at one time" (04/03/2016)  . Family history of adverse reaction to anesthesia    daughter gets nauseated  . GERD (gastroesophageal reflux disease)   . High cholesterol   . Hypertension   . Hypothyroidism   . Skin cancer    "right neck; mid forehead"    Past Surgical History:  Procedure Laterality Date  . ABDOMINAL HERNIA REPAIR     "after they took tumor out of my colon I developed a hernia"  . ABDOMINAL HYSTERECTOMY    . BACK SURGERY    . BREAST BIOPSY Right 2012   positive  .  BREAST BIOPSY Right 2014   positive  . BREAST BIOPSY Right 06/26/2016   US guided - waiting for pathology  . BREAST EXCISIONAL BIOPSY Right 1995   benign  . CARPAL TUNNEL RELEASE Left   . DILATION AND CURETTAGE OF UTERUS     S/P miscarriage  . IR GENERIC HISTORICAL  04/04/2016   IR LUMBAR DISC ASPIRATION W/IMG GUIDE 04/04/2016 [name], MD [facility]  . JOINT REPLACEMENT    . LAPAROSCOPIC CHOLECYSTECTOMY  2016  . REPLACEMENT TOTAL HIP W/  RESURFACING IMPLANTS Bilateral   . SKIN CANCER EXCISION     "right neck; mid forehead"  . TOTAL HIP ARTHROPLASTY Right 10/03/2015   Procedure: TOTAL HIP ARTHROPLASTY ANTERIOR APPROACH;  Surgeon: Hessie Knows, MD;  Location: [facility] ORS;  Service: Orthopedics;  Laterality: Right;  . TOTAL HIP ARTHROPLASTY Left 07/25/2016   Procedure: TOTAL HIP ARTHROPLASTY ANTERIOR APPROACH;  Surgeon: Hessie Knows, MD;  Location: [facility] ORS;  Service: Orthopedics;  Laterality: Left;  . TUBAL LIGATION    . TUMOR EXCISION  2012   "small intestine; came from the breast cancer"    Family History Family History  Problem Relation Age of Onset  . Lung cancer Brother 64  . Heart disease Father   .  Diabetes Father   . Arthritis Mother   . Breast cancer Neg Hx   . Kidney cancer Neg Hx   . Prostate cancer Neg Hx     Social History Social History   Tobacco Use  . Smoking status: Current Every Day Smoker    Packs/day: 1.00    Years: 54.00    Pack years: 54.00    Types: Cigarettes  . Smokeless tobacco: Never Used  . Tobacco comment: not ready to quit  Substance Use Topics  . Alcohol use: No  . Drug use: No    Allergies  Allergen Reactions  . Carafate [Sucralfate] Nausea Only  . Diclofenac Sodium Nausea Only  . Erythromycin Diarrhea and Nausea And Vomiting  . Adhesive [Tape] Itching and Rash  . Wellbutrin [Bupropion] Itching and Rash    Current Outpatient Medications  Medication Sig Dispense Refill  . acetaminophen (TYLENOL) 500 MG tablet Take  1,000 mg by mouth daily as needed for mild pain or fever.     Marland Kitchen albuterol (VENTOLIN HFA) 108 (90 Base) MCG/ACT inhaler Inhale 2 puffs into the lungs every 4 (four) hours as needed.    . cetirizine (ZYRTEC) 10 MG tablet Take 10 mg by mouth daily as needed for allergies. Reported on 10/03/2015    . citalopram (CELEXA) 20 MG tablet Take 20 mg by mouth every morning.     . dicyclomine (BENTYL) 10 MG capsule Take 10 mg by mouth 3 (three) times daily as needed for spasms.    Marland Kitchen letrozole (FEMARA) 2.5 MG tablet Take 1 tablet (2.5 mg total) by mouth daily. 90 tablet 4  . levothyroxine (SYNTHROID, LEVOTHROID) 100 MCG tablet Take 100 mcg by mouth daily before breakfast.     . montelukast (SINGULAIR) 10 MG tablet Take 10 mg by mouth at bedtime.     . Multiple Vitamins-Minerals (CENTRUM SILVER PO) Take 1 tablet by mouth every morning.    Marland Kitchen omeprazole (PRILOSEC) 20 MG capsule Take 20 mg by mouth every morning.     . palbociclib (IBRANCE) 100 MG tablet Take 1 tablet (100 mg total) by mouth daily. Take for 21 days on, 7 days off, repeat every 28 days. 21 tablet 4  . pravastatin (PRAVACHOL) 40 MG tablet Take 40 mg by mouth at bedtime.     . triamcinolone cream (KENALOG) 0.1 % APPLY TO AFFECTED AREA TWICE A DAY UNTIL CLEAR    . valsartan-hydrochlorothiazide (DIOVAN-HCT) 80-12.5 MG tablet Take 1 tablet by mouth daily.    Marland Kitchen zolpidem (AMBIEN) 10 MG tablet TAKE 1 TABLET BY MOUTH EVERY DAY AT BEDTIME AS NEEDED FOR SLEEP 30 tablet 2   No current facility-administered medications for this visit.       REVIEW OF SYSTEMS (Negative unless checked)  Constitutional: [] Weight loss  [] Fever  [] Chills Cardiac: [] Chest pain   [] Chest pressure   [] Palpitations   [] Shortness of breath when laying flat   [] Shortness of breath at rest   [] Shortness of breath with exertion. Vascular:  [] Pain in legs with walking   [] Pain in legs at rest   [] Pain in legs when laying flat   [] Claudication   [] Pain in feet when walking  [] Pain in  feet at rest  [] Pain in feet when laying flat   [] History of DVT   [] Phlebitis   [] Swelling in legs   [] Varicose veins   [] Non-healing ulcers Pulmonary:   [] Uses home oxygen   [] Productive cough   [] Hemoptysis   [] Wheeze  [x] COPD   [] Asthma Neurologic:  []   Dizziness  [] Blackouts   [] Seizures   [] History of stroke   [] History of TIA  [] Aphasia   [] Temporary blindness   [] Dysphagia   [] Weakness or numbness in arms   [] Weakness or numbness in legs Musculoskeletal:  [x] Arthritis   [] Joint swelling   [x] Joint pain   [] Low back pain Hematologic:  [] Easy bruising  [] Easy bleeding   [] Hypercoagulable state   [] Anemic  [] Hepatitis Gastrointestinal:  [] Blood in stool   [] Vomiting blood  [x] Gastroesophageal reflux/heartburn   [] Abdominal pain Genitourinary:  [] Chronic kidney disease   [] Difficult urination  [] Frequent urination  [] Burning with urination   [] Hematuria Skin:  [] Rashes   [] Ulcers   [] Wounds Psychological:  [x] History of anxiety   [x]  History of major depression.    Physical Exam BP 118/72 (BP Location: Left Arm, Patient Position: Sitting, Cuff Size: Normal)   Pulse 65   Resp 10   Ht 5\' 4"  (1.626 m)   Wt 139 lb (63 kg)   BMI 23.86 kg/m  Gen:  WD/WN, NAD Head: [place]/AT, No temporalis wasting.  Ear/Nose/Throat: Hearing grossly intact, nares w/o erythema or drainage, oropharynx w/o Erythema/Exudate Eyes: Conjunctiva clear, sclera non-icteric  Neck: trachea midline.  No JVD.  Pulmonary:  Good air movement, respirations not labored, no use of accessory muscles  Cardiac: RRR, no JVD Vascular:  Vessel Right Left  Radial Palpable Palpable                          DP  palpable  palpable  PT  palpable  palpable   Gastrointestinal:. No masses, surgical incisions, or scars. Musculoskeletal: M/S 5/5 throughout. No deformity or atrophy.  Mild cyanosis of several fingertips and toe tips without open ulceration or infection.  No edema. Neurologic: Sensation grossly intact in extremities.   Symmetrical.  Speech is fluent. Motor exam as listed above. Psychiatric: Judgment intact, Mood & affect appropriate for pt's clinical situation. Dermatologic: No rashes or ulcers noted.  No cellulitis or open wounds.    Radiology No results found.  Labs Recent Results (from the past 2160 hour(s))  CBC with Differential     Status: Abnormal   Collection Time: 03/15/19  2:05 PM  Result Value Ref Range   WBC 3.2 (L) 4.0 - 10.5 K/uL   RBC 2.87 (L) 3.87 - 5.11 MIL/uL   Hemoglobin 10.5 (L) 12.0 - 15.0 g/dL   HCT 30.9 (L) 36.0 - 46.0 %   MCV 107.7 (H) 80.0 - 100.0 fL   MCH 36.6 (H) 26.0 - 34.0 pg   MCHC 34.0 30.0 - 36.0 g/dL   RDW 13.3 11.5 - 15.5 %   Platelets 220 150 - 400 K/uL   nRBC 0.0 0.0 - 0.2 %   Neutrophils Relative % 71 %   Neutro Abs 2.3 1.7 - 7.7 K/uL   Lymphocytes Relative 18 %   Lymphs Abs 0.6 (L) 0.7 - 4.0 K/uL   Monocytes Relative 6 %   Monocytes Absolute 0.2 0.1 - 1.0 K/uL   Eosinophils Relative 1 %   Eosinophils Absolute 0.0 0.0 - 0.5 K/uL   Basophils Relative 3 %   Basophils Absolute 0.1 0.0 - 0.1 K/uL   Immature Granulocytes 1 %   Abs Immature Granulocytes 0.03 0.00 - 0.07 K/uL    Comment: Performed at [facility], [address] [place], [place] [address]  Cancer antigen 27.29     Status: Abnormal   Collection Time: 03/15/19  2:05 PM  Result Value  Ref Range   CA 27.29 47.6 (H) 0.0 - 38.6 U/mL    Comment: (NOTE) Siemens Centaur Immunochemiluminometric Methodology [facility]) Values obtained with different assay methods or kits cannot be used interchangeably. Results cannot be interpreted as absolute evidence of the presence or absence of malignant disease. Performed At: [facility] [place], [place] [id number] Rush Farmer MD [id number]   Comprehensive metabolic panel     Status: Abnormal   Collection Time: 03/15/19  2:05 PM  Result Value Ref Range   Sodium 139 135 - 145 mmol/L   Potassium 3.8 3.5 - 5.1 mmol/L    Chloride 105 98 - 111 mmol/L   CO2 24 22 - 32 mmol/L   Glucose, Bld 145 (H) 70 - 99 mg/dL   BUN 30 (H) 8 - 23 mg/dL   Creatinine, Ser 1.46 (H) 0.44 - 1.00 mg/dL   Calcium 9.0 8.9 - 10.3 mg/dL   Total Protein 7.2 6.5 - 8.1 g/dL   Albumin 4.2 3.5 - 5.0 g/dL   AST 26 15 - 41 U/L   ALT 18 0 - 44 U/L   Alkaline Phosphatase 80 38 - 126 U/L   Total Bilirubin 0.5 0.3 - 1.2 mg/dL   GFR calc non Af Amer 35 (L) >60 mL/min   GFR calc Af Amer 41 (L) >60 mL/min   Anion gap 10 5 - 15    Comment: Performed at [facility], [address] [place], [place] [address]  CBC with Differential/Platelet     Status: Abnormal   Collection Time: 04/29/19  1:56 PM  Result Value Ref Range   WBC 2.6 (L) 4.0 - 10.5 K/uL   RBC 3.00 (L) 3.87 - 5.11 MIL/uL   Hemoglobin 10.9 (L) 12.0 - 15.0 g/dL   HCT 32.0 (L) 36.0 - 46.0 %   MCV 106.7 (H) 80.0 - 100.0 fL   MCH 36.3 (H) 26.0 - 34.0 pg   MCHC 34.1 30.0 - 36.0 g/dL   RDW 13.4 11.5 - 15.5 %   Platelets 108 (L) 150 - 400 K/uL   nRBC 0.0 0.0 - 0.2 %   Neutrophils Relative % 67 %   Neutro Abs 1.7 1.7 - 7.7 K/uL   Lymphocytes Relative 23 %   Lymphs Abs 0.6 (L) 0.7 - 4.0 K/uL   Monocytes Relative 8 %   Monocytes Absolute 0.2 0.1 - 1.0 K/uL   Eosinophils Relative 0 %   Eosinophils Absolute 0.0 0.0 - 0.5 K/uL   Basophils Relative 2 %   Basophils Absolute 0.1 0.0 - 0.1 K/uL   Immature Granulocytes 0 %   Abs Immature Granulocytes 0.01 0.00 - 0.07 K/uL    Comment: Performed at [facility], [address] [place], [place] [address]  Comprehensive metabolic panel     Status: Abnormal   Collection Time: 04/29/19  1:56 PM  Result Value Ref Range   Sodium 138 135 - 145 mmol/L   Potassium 3.8 3.5 - 5.1 mmol/L   Chloride 102 98 - 111 mmol/L   CO2 25 22 - 32 mmol/L   Glucose, Bld 142 (H) 70 - 99 mg/dL   BUN 28 (H) 8 - 23 mg/dL   Creatinine, Ser 1.30 (H) 0.44 - 1.00 mg/dL   Calcium 9.2 8.9 - 10.3 mg/dL   Total Protein 7.0 6.5 - 8.1 g/dL   Albumin 4.5  3.5 - 5.0 g/dL   AST 24 15 - 41 U/L   ALT 16 0 - 44 U/L  Alkaline Phosphatase 78 38 - 126 U/L   Total Bilirubin 0.5 0.3 - 1.2 mg/dL   GFR calc non Af Amer 41 (L) >60 mL/min   GFR calc Af Amer 47 (L) >60 mL/min   Anion gap 11 5 - 15    Comment: Performed at [facility], [address] [place], [place] [address]  Cancer antigen 27.29     Status: Abnormal   Collection Time: 04/29/19  1:56 PM  Result Value Ref Range   CA 27.29 55.3 (H) 0.0 - 38.6 U/mL    Comment: (NOTE) Siemens Centaur Immunochemiluminometric Methodology ([facility]) Values obtained with different assay methods or kits cannot be used interchangeably. Results cannot be interpreted as absolute evidence of the presence or absence of malignant disease. Performed At: [facility] [place], [place] [id number] Rush Farmer MD [id number]     Assessment/Plan:  Benign essential hypertension blood pressure control important in reducing the progression of atherosclerotic disease. On appropriate oral medications.   Hyperlipemia, mixed lipid control important in reducing the progression of atherosclerotic disease. Continue statin therapy   Raynaud's syndrome without gangrene To assess her for peripheral arterial disease today, noninvasive studies were performed.  She has normal ABIs of 1.0 bilaterally with triphasic waveforms.  Her digital pressures are a little bit blunted at 85 on the right and 62 on the left.  The patient clinically sounds like she has Raynaud's disease.  At this point, I would recommend avoidance of cold stimuli and the addition of an 81 mg aspirin daily.  I have discussed that if her symptoms worsen, calcium channel blockers can be used to try to reduce the spasm.  I will plan to see her back in 3 to 4 months to assess her symptoms and determine whether or not further medical therapy would be indicated.  No further invasive testing or procedures will be required at this time.       Leotis Pain 06/08/2019, 12:31 PM   This note was created with Dragon medical transcription system.  Any errors from dictation are unintentional.

## 2019-06-08 NOTE — Assessment & Plan Note (Signed)
To assess her for peripheral arterial disease today, noninvasive studies were performed.  She has normal ABIs of 1.0 bilaterally with triphasic waveforms.  Her digital pressures are a little bit blunted at 85 on the right and 62 on the left.  The patient clinically sounds like she has Raynaud's disease.  At this point, I would recommend avoidance of cold stimuli and the addition of an 81 mg aspirin daily.  I have discussed that if her symptoms worsen, calcium channel blockers can be used to try to reduce the spasm.  I will plan to see her back in 3 to 4 months to assess her symptoms and determine whether or not further medical therapy would be indicated.  No further invasive testing or procedures will be required at this time.

## 2019-06-11 ENCOUNTER — Encounter (INDEPENDENT_AMBULATORY_CARE_PROVIDER_SITE_OTHER): Payer: Self-pay | Admitting: Vascular Surgery

## 2019-06-18 NOTE — Progress Notes (Signed)
Patient is coming in Monday for labs and follow up, she is doing well no complaints

## 2019-06-20 NOTE — Progress Notes (Signed)
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  INTERVAL HISTORY: Patient returns to clinic today for further evaluation and continuation of Ibrance.  She has new onset intermittent abdominal pain that may or may not be associated with increased diarrhea.  She has chronic back pain as well. She has no neurologic complaints.  She denies any recent fevers or illnesses.  She denies any chest pain, shortness of breath, cough, or hemoptysis.  She has a fair appetite and denies weight loss.  She denies any nausea or vomiting.  She has no melena or hematochezia.  She has no urinary complaints.  Patient offers no further specific complaints today.  REVIEW OF SYSTEMS:   Review of Systems  Constitutional: Negative.  Negative for fever, malaise/fatigue and weight loss.  Respiratory: Negative.  Negative for cough and shortness of breath.   Cardiovascular: Negative.  Negative for chest pain and leg swelling.  Gastrointestinal: Positive for abdominal pain and diarrhea. Negative for blood in stool and melena.  Genitourinary: Negative.   Musculoskeletal: Positive for back pain. Negative for joint pain.  Skin: Negative.  Negative for rash.  Neurological: Negative.  Negative for dizziness, sensory change, focal weakness, weakness and headaches.  Psychiatric/Behavioral: Negative.  The patient is not nervous/anxious.     As per HPI. Otherwise, a complete review of systems is negative.  PAST MEDICAL HISTORY: Past Medical History:  Diagnosis Date  . Anxiety   . Arthritis    "back" (04/03/2016)  .  Breast cancer ([place]) 2012    no lumpectomy. Treating with meds- Right  . Cancer ([place])    small intestines 2012, gallbladder 2016  . Chronic lower back pain   . COPD (chronic obstructive pulmonary disease) ([place])    "use inhalers for COPD that dx'd at one time" (04/03/2016)  . Family history of adverse reaction to anesthesia    daughter gets nauseated  . GERD (gastroesophageal reflux disease)   . High cholesterol   . Hypertension   . Hypothyroidism   . Skin cancer    "right neck; mid forehead"    PAST SURGICAL HISTORY: Past Surgical History:  Procedure Laterality Date  . ABDOMINAL HERNIA REPAIR     "after they took tumor out of my colon I developed a hernia"  . ABDOMINAL HYSTERECTOMY    . BACK SURGERY    . BREAST BIOPSY Right 2012   positive  . BREAST BIOPSY Right 2014   positive  . BREAST BIOPSY Right 06/26/2016   US guided - waiting for pathology  . BREAST EXCISIONAL BIOPSY Right 1995   benign  . CARPAL TUNNEL RELEASE Left   . DILATION AND CURETTAGE OF UTERUS     S/P miscarriage  . IR GENERIC HISTORICAL  04/04/2016   IR LUMBAR DISC ASPIRATION W/IMG GUIDE 04/04/2016 [name], MD [facility]  . JOINT REPLACEMENT    . LAPAROSCOPIC CHOLECYSTECTOMY  2016  . REPLACEMENT TOTAL HIP W/  RESURFACING IMPLANTS Bilateral   . SKIN CANCER EXCISION     "right neck; mid forehead"  . TOTAL HIP ARTHROPLASTY Right 10/03/2015   Procedure: TOTAL HIP ARTHROPLASTY ANTERIOR APPROACH;  Surgeon: Hessie Knows, MD;  Location: [facility] ORS;  Service: Orthopedics;  Laterality: Right;  . TOTAL HIP ARTHROPLASTY Left 07/25/2016   Procedure: TOTAL HIP ARTHROPLASTY ANTERIOR APPROACH;  Surgeon: Hessie Knows, MD;  Location: [facility] ORS;  Service: Orthopedics;  Laterality: Left;  . TUBAL LIGATION    . TUMOR EXCISION  2012   "small intestine; came from the breast cancer"    FAMILY HISTORY: Reviewed and unchanged. No reported history of breast cancer or other chronic diseases.     ADVANCED DIRECTIVES:     HEALTH MAINTENANCE: Social History   Tobacco Use  . Smoking status: Current Every Day Smoker    Packs/day: 1.00    Years: 54.00    Pack years: 54.00    Types: Cigarettes  . Smokeless tobacco: Never Used  . Tobacco comment: not ready to quit  Substance Use Topics  . Alcohol use: No  . Drug use: No     Colonoscopy:  PAP:  Bone density:  Lipid panel:  Allergies  Allergen Reactions  . Carafate [Sucralfate] Nausea Only  . Diclofenac Sodium Nausea Only  . Erythromycin Diarrhea and Nausea And Vomiting  . Adhesive [Tape] Itching and Rash  . Wellbutrin [Bupropion] Itching and Rash    Current Outpatient Medications  Medication Sig Dispense Refill  . acetaminophen (TYLENOL) 500 MG tablet Take 1,000 mg by mouth daily as needed for mild pain or fever.     Marland Kitchen albuterol (VENTOLIN HFA) 108 (90 Base) MCG/ACT inhaler Inhale 2 puffs into the lungs every 4 (four) hours as needed.    . cetirizine (ZYRTEC) 10 MG tablet Take 10 mg by mouth daily as needed for allergies. Reported on 10/03/2015    . citalopram (CELEXA) 20 MG tablet Take 20 mg by mouth every morning.     . dicyclomine (BENTYL) 10 MG capsule Take 10 mg by mouth 3 (three) times daily as needed for spasms.    Marland Kitchen letrozole (FEMARA) 2.5 MG tablet Take 1 tablet (2.5 mg total) by mouth daily. 90 tablet 4  . levothyroxine (SYNTHROID, LEVOTHROID) 100 MCG tablet Take 100 mcg by mouth daily before breakfast.     . montelukast (SINGULAIR) 10 MG tablet Take 10 mg by mouth at bedtime.     . Multiple Vitamins-Minerals (CENTRUM SILVER PO) Take 1 tablet by mouth every morning.    Marland Kitchen omeprazole (PRILOSEC) 20 MG capsule Take 20 mg by mouth every morning.     . palbociclib (IBRANCE) 100 MG tablet Take 1 tablet (100 mg total) by mouth daily. Take for 21 days on, 7 days off, repeat every 28 days. 21 tablet 4  . pravastatin (PRAVACHOL) 40 MG tablet Take 40 mg by mouth at bedtime.     . triamcinolone cream (KENALOG) 0.1 % APPLY TO AFFECTED AREA TWICE A DAY  UNTIL CLEAR    . valsartan-hydrochlorothiazide (DIOVAN-HCT) 80-12.5 MG tablet Take 1 tablet by mouth daily.    Marland Kitchen zolpidem (AMBIEN) 10 MG tablet TAKE 1 TABLET BY MOUTH EVERY DAY AT BEDTIME AS NEEDED FOR SLEEP 30 tablet 2   No current facility-administered medications for this visit.     OBJECTIVE: Vitals:   06/18/19 0950  BP: (!) 115/55  Pulse: 70  Resp: 18  Temp: 98.1 F (36.7 C)  SpO2: 100%     Body mass index is 24.53 kg/m.    ECOG FS:0 - Asymptomatic  General: Well-developed, well-nourished, no acute distress. Eyes: Pink conjunctiva, anicteric sclera. HEENT: Normocephalic, moist mucous membranes. Lungs: Clear to auscultation bilaterally. Heart: Regular rate and rhythm. No  rubs, murmurs, or gallops. Abdomen: Soft, nontender, nondistended. No organomegaly noted, normoactive bowel sounds. Musculoskeletal: No edema, cyanosis, or clubbing. Neuro: Alert, answering all questions appropriately. Cranial nerves grossly intact. Skin: No rashes or petechiae noted. Psych: Normal affect.  LAB RESULTS: Lab Results  Component Value Date   NA 141 06/21/2019   K 3.6 06/21/2019   CL 104 06/21/2019   CO2 24 06/21/2019   GLUCOSE 133 (H) 06/21/2019   BUN 25 (H) 06/21/2019   CREATININE 1.29 (H) 06/21/2019   CALCIUM 9.1 06/21/2019   PROT 6.9 06/21/2019   ALBUMIN 4.1 06/21/2019   AST 21 06/21/2019   ALT 15 06/21/2019   ALKPHOS 89 06/21/2019   BILITOT 0.5 06/21/2019   GFRNONAA 41 (L) 06/21/2019   GFRAA 48 (L) 06/21/2019    Lab Results  Component Value Date   WBC 2.2 (L) 06/21/2019   NEUTROABS 1.4 (L) 06/21/2019   HGB 10.2 (L) 06/21/2019   HCT 29.8 (L) 06/21/2019   MCV 108.0 (H) 06/21/2019   PLT 111 (L) 06/21/2019    STUDIES: Vas [place] Abi With/wo Tbi  Result Date: 06/08/2019 LOWER EXTREMITY DOPPLER STUDY Indications: Toe Cyanosis.  Performing Technologist: Almira Coaster RVS  Examination Guidelines: A complete evaluation includes at minimum, Doppler waveform signals and  systolic blood pressure reading at the level of bilateral brachial, anterior tibial, and posterior tibial arteries, when vessel segments are accessible. Bilateral testing is considered an integral part of a complete examination. Photoelectric Plethysmograph (PPG) waveforms and toe systolic pressure readings are included as required and additional duplex testing as needed. Limited examinations for reoccurring indications may be performed as noted.  ABI Findings: +---------+------------------+-----+---------+--------+ Right    Rt Pressure (mmHg)IndexWaveform Comment  +---------+------------------+-----+---------+--------+ Brachial 143                                      +---------+------------------+-----+---------+--------+ ATA      145               1.01 triphasic         +---------+------------------+-----+---------+--------+ PTA      138               0.97 triphasic         +---------+------------------+-----+---------+--------+ Great Toe85                0.59 Abnormal          +---------+------------------+-----+---------+--------+ +---------+------------------+-----+---------+-------+ Left     Lt Pressure (mmHg)IndexWaveform Comment +---------+------------------+-----+---------+-------+ ATA      129               0.90 biphasic         +---------+------------------+-----+---------+-------+ PTA      146               1.02 triphasic        +---------+------------------+-----+---------+-------+ Great Toe62                0.43 Abnormal         +---------+------------------+-----+---------+-------+ +-------+-----------+-----------+------------+------------+ ABI/TBIToday's ABIToday's TBIPrevious ABIPrevious TBI +-------+-----------+-----------+------------+------------+ Right  1.01       .59                                 +-------+-----------+-----------+------------+------------+ Left   1.02       .43                                  +-------+-----------+-----------+------------+------------+  Summary: Right: Resting right ankle-brachial index is within normal range. No evidence of significant right lower extremity arterial disease. The right toe-brachial index is abnormal. Left: Resting left ankle-brachial index is within normal range. No evidence of significant left lower extremity arterial disease. The left toe-brachial index is abnormal.  *See table(s) above for measurements and observations.  Electronically signed by Leotis Pain MD on 06/08/2019 at 5:28:28 PM.   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  PLAN:    1.  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  Repeat PET scan on January 29, 2019 reviewed independently and reported as above with only mild increase in FDG accumulation consistent with a slowly increasing CA 27-29.  She was also noted to have a new lytic hypermetabolic lesion in her left iliac bone.  Her most recent CA 27-29 increased slightly to 55.3.  She continues to have persistent pancytopenia, but this is chronic and unchanged.  Despite mild progression of disease, patient remains relatively asymptomatic and is not interested in pursuing aggressive chemotherapy at this time.  Will continue with dose reduced Ibrance to 100 mg every 21 days with 7 days off along with daily letrozole.  Return to clinic in 3 months with repeat laboratory work and further evaluation. 2.  Hip/back pain: Chronic.  Patient is status post bilateral hip replacement. 3.  Anemia: Patient's hemoglobin continues to slowly trend down and is now 10.2. 4.  Renal insufficiency: Creatinine has improved to 1.29. 5.  Leukopenia: Chronic and unchanged.  Decreased secondary to Ibrance.   Continue with dose reduced treatment as above. 6.  Thrombocytopenia: Mild, monitor. 7.  Abdominal pain/diarrhea: We will get CT of the abdomen pelvis for further evaluation.  Continue Imodium as needed. 8.  Bony lesions: Consider reintroducing Zometa in the near future.   Patient expressed understanding and was in agreement with this plan. She also understands that She can call clinic at any time with any questions, concerns, or complaints.    Lloyd Huger, MD   06/21/2019 3:34 PM

## 2019-06-21 ENCOUNTER — Inpatient Hospital Stay (HOSPITAL_BASED_OUTPATIENT_CLINIC_OR_DEPARTMENT_OTHER): Payer: Medicare Other | Admitting: Oncology

## 2019-06-21 ENCOUNTER — Inpatient Hospital Stay: Payer: Medicare Other | Attending: Oncology

## 2019-06-21 ENCOUNTER — Other Ambulatory Visit: Payer: Self-pay

## 2019-06-21 ENCOUNTER — Encounter: Payer: Self-pay | Admitting: Oncology

## 2019-06-21 VITALS — BP 115/55 | HR 70 | Temp 98.1°F | Resp 18 | Wt 142.9 lb

## 2019-06-21 DIAGNOSIS — G8929 Other chronic pain: Secondary | ICD-10-CM | POA: Insufficient documentation

## 2019-06-21 DIAGNOSIS — D72819 Decreased white blood cell count, unspecified: Secondary | ICD-10-CM | POA: Diagnosis not present

## 2019-06-21 DIAGNOSIS — M899 Disorder of bone, unspecified: Secondary | ICD-10-CM | POA: Diagnosis not present

## 2019-06-21 DIAGNOSIS — R109 Unspecified abdominal pain: Secondary | ICD-10-CM | POA: Diagnosis not present

## 2019-06-21 DIAGNOSIS — C7951 Secondary malignant neoplasm of bone: Secondary | ICD-10-CM | POA: Diagnosis not present

## 2019-06-21 DIAGNOSIS — C50911 Malignant neoplasm of unspecified site of right female breast: Secondary | ICD-10-CM | POA: Diagnosis not present

## 2019-06-21 DIAGNOSIS — M545 Low back pain: Secondary | ICD-10-CM | POA: Diagnosis not present

## 2019-06-21 DIAGNOSIS — C7989 Secondary malignant neoplasm of other specified sites: Secondary | ICD-10-CM | POA: Diagnosis not present

## 2019-06-21 DIAGNOSIS — Z96643 Presence of artificial hip joint, bilateral: Secondary | ICD-10-CM | POA: Diagnosis not present

## 2019-06-21 DIAGNOSIS — R197 Diarrhea, unspecified: Secondary | ICD-10-CM | POA: Diagnosis not present

## 2019-06-21 DIAGNOSIS — N289 Disorder of kidney and ureter, unspecified: Secondary | ICD-10-CM | POA: Insufficient documentation

## 2019-06-21 DIAGNOSIS — D61818 Other pancytopenia: Secondary | ICD-10-CM | POA: Diagnosis not present

## 2019-06-21 DIAGNOSIS — F17210 Nicotine dependence, cigarettes, uncomplicated: Secondary | ICD-10-CM | POA: Insufficient documentation

## 2019-06-21 DIAGNOSIS — C50411 Malignant neoplasm of upper-outer quadrant of right female breast: Secondary | ICD-10-CM | POA: Diagnosis not present

## 2019-06-21 DIAGNOSIS — C50919 Malignant neoplasm of unspecified site of unspecified female breast: Secondary | ICD-10-CM

## 2019-06-21 DIAGNOSIS — D696 Thrombocytopenia, unspecified: Secondary | ICD-10-CM | POA: Insufficient documentation

## 2019-06-21 DIAGNOSIS — C784 Secondary malignant neoplasm of small intestine: Secondary | ICD-10-CM | POA: Insufficient documentation

## 2019-06-21 LAB — COMPREHENSIVE METABOLIC PANEL
ALT: 15 U/L (ref 0–44)
AST: 21 U/L (ref 15–41)
Albumin: 4.1 g/dL (ref 3.5–5.0)
Alkaline Phosphatase: 89 U/L (ref 38–126)
Anion gap: 13 (ref 5–15)
BUN: 25 mg/dL — ABNORMAL HIGH (ref 8–23)
CO2: 24 mmol/L (ref 22–32)
Calcium: 9.1 mg/dL (ref 8.9–10.3)
Chloride: 104 mmol/L (ref 98–111)
Creatinine, Ser: 1.29 mg/dL — ABNORMAL HIGH (ref 0.44–1.00)
GFR calc Af Amer: 48 mL/min — ABNORMAL LOW (ref >60)
GFR calc non Af Amer: 41 mL/min — ABNORMAL LOW (ref >60)
Glucose, Bld: 133 mg/dL — ABNORMAL HIGH (ref 70–99)
Potassium: 3.6 mmol/L (ref 3.5–5.1)
Sodium: 141 mmol/L (ref 135–145)
Total Bilirubin: 0.5 mg/dL (ref 0.3–1.2)
Total Protein: 6.9 g/dL (ref 6.5–8.1)

## 2019-06-21 LAB — CBC WITH DIFFERENTIAL/PLATELET
Abs Immature Granulocytes: 0.01 10*3/uL (ref 0.00–0.07)
Basophils Absolute: 0 10*3/uL (ref 0.0–0.1)
Basophils Relative: 2 %
Eosinophils Absolute: 0 10*3/uL (ref 0.0–0.5)
Eosinophils Relative: 2 %
HCT: 29.8 % — ABNORMAL LOW (ref 36.0–46.0)
Hemoglobin: 10.2 g/dL — ABNORMAL LOW (ref 12.0–15.0)
Immature Granulocytes: 1 %
Lymphocytes Relative: 24 %
Lymphs Abs: 0.5 10*3/uL — ABNORMAL LOW (ref 0.7–4.0)
MCH: 37 pg — ABNORMAL HIGH (ref 26.0–34.0)
MCHC: 34.2 g/dL (ref 30.0–36.0)
MCV: 108 fL — ABNORMAL HIGH (ref 80.0–100.0)
Monocytes Absolute: 0.2 10*3/uL (ref 0.1–1.0)
Monocytes Relative: 8 %
Neutro Abs: 1.4 10*3/uL — ABNORMAL LOW (ref 1.7–7.7)
Neutrophils Relative %: 63 %
Platelets: 111 10*3/uL — ABNORMAL LOW (ref 150–400)
RBC: 2.76 MIL/uL — ABNORMAL LOW (ref 3.87–5.11)
RDW: 13.3 % (ref 11.5–15.5)
WBC: 2.2 10*3/uL — ABNORMAL LOW (ref 4.0–10.5)
nRBC: 0 % (ref 0.0–0.2)

## 2019-06-22 LAB — CANCER ANTIGEN 27.29: CA 27.29: 60.7 U/mL — ABNORMAL HIGH (ref 0.0–38.6)

## 2019-06-23 MED FILL — IBRANCE 100 MG TABS: 100 | 28 days supply | Qty: 21 | Fill #4

## 2019-07-02 ENCOUNTER — Other Ambulatory Visit: Payer: Self-pay

## 2019-07-02 ENCOUNTER — Ambulatory Visit
Admission: RE | Admit: 2019-07-02 | Discharge: 2019-07-02 | Disposition: A | Discharge status: Home / Self Care | Payer: Medicare Other | Source: Ambulatory Visit | Attending: Oncology | Admitting: Oncology

## 2019-07-02 DIAGNOSIS — C50911 Malignant neoplasm of unspecified site of right female breast: Secondary | ICD-10-CM | POA: Insufficient documentation

## 2019-07-02 MED ORDER — IOHEXOL 300 MG/ML  SOLN
100.0000 mL | Freq: Once | INTRAMUSCULAR | Status: AC | PRN
  Administered 2019-07-02: 100 mL via INTRAVENOUS

## 2019-07-05 ENCOUNTER — Encounter: Payer: Self-pay | Admitting: Oncology

## 2019-07-05 ENCOUNTER — Telehealth: Payer: Self-pay | Admitting: Pharmacy Technician

## 2019-07-05 NOTE — Progress Notes (Signed)
Patient prescreened for appointment. Patient has no concerns or questions.  

## 2019-07-05 NOTE — Telephone Encounter (Signed)
Oral Oncology Patient Advocate Encounter   Was successful in securing patient an $5,400 grant from [facility]) to provide copayment coverage for Ibrance.  This will keep the out of pocket expense at $0.     I have spoken with the patient.    The billing information is as follows and has been shared with [place].   Member ID: [id number] Group ID: [id number] RxBin: [id number] Dates of Eligibility: 04/03/2019 through 04/01/2020  Fund:  Metastatic Breast [place] Patient [place] Phone [phone] Fax [phone] 07/05/2019 9:45 AM

## 2019-07-06 ENCOUNTER — Other Ambulatory Visit: Payer: Self-pay

## 2019-07-06 ENCOUNTER — Inpatient Hospital Stay (HOSPITAL_BASED_OUTPATIENT_CLINIC_OR_DEPARTMENT_OTHER): Payer: Medicare Other | Admitting: Oncology

## 2019-07-06 DIAGNOSIS — C50919 Malignant neoplasm of unspecified site of unspecified female breast: Secondary | ICD-10-CM

## 2019-07-06 NOTE — Progress Notes (Signed)
Sara David  [phone] Fax:([phone]) [phone]  ID: Sara David OB: 06/02/1946  MR#: [id number]  [id number]  [name], [name] Hessie Knows, MD as Consulting Physician (Orthopedic Surgery) Murlean Iba, MD (Nephrology)   I connected with Sara David on 07/06/19 at 11:30 AM EST by video enabled telemedicine visit and verified that I am speaking with the correct person using two identifiers.   I discussed the limitations, risks, security and privacy concerns of performing an evaluation and management service by telemedicine and the availability of in-person appointments. I also discussed with the patient that there may be a patient responsible charge related to this service. The patient expressed understanding and agreed to proceed.   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  INTERVAL HISTORY: Patient agreed to video enabled telemedicine visit to discuss her imaging results and further diagnostic planning.  She currently feels well and is asymptomatic.  She does not complain of pain today.  She has no further abdominal pain or diarrhea. She has no neurologic complaints.  She denies any recent fevers or illnesses.  She denies any chest pain, shortness of breath, cough, or hemoptysis.  She has a fair appetite and denies weight loss.  She denies any nausea or vomiting.  She has no melena or hematochezia.  She has no urinary complaints.  Patient offers no specific complaints today.  REVIEW OF SYSTEMS:   Review of Systems  Constitutional: Negative.  Negative for fever, malaise/fatigue and weight loss.  Respiratory: Negative.  Negative for cough and shortness of  breath.   Cardiovascular: Negative.  Negative for chest pain and leg swelling.  Gastrointestinal: Negative.  Negative for abdominal pain, blood in stool, diarrhea and melena.  Genitourinary: Negative.   Musculoskeletal: Negative.  Negative for back pain and joint pain.  Skin: Negative.  Negative for rash.  Neurological: Negative.  Negative for dizziness, sensory change, focal weakness, weakness and headaches.  Psychiatric/Behavioral: Negative.  The patient is not nervous/anxious.     As per HPI. Otherwise, a complete review of systems is negative.  PAST MEDICAL HISTORY: Past Medical History:  Diagnosis Date   Anxiety    Arthritis    "back" (04/03/2016)   Breast cancer ([place]) 2012    no lumpectomy. Treating with meds- Right   Cancer ([place])    small intestines 2012, gallbladder 2016   Chronic lower back pain    COPD (chronic obstructive pulmonary disease) ([place])    "use inhalers for COPD that dx'd at one time" (04/03/2016)   Family history of adverse reaction to anesthesia    daughter gets nauseated   GERD (gastroesophageal reflux disease)    High cholesterol    Hypertension    Hypothyroidism    Skin cancer    "right neck; mid forehead"    PAST SURGICAL HISTORY: Past Surgical History:  Procedure Laterality Date   ABDOMINAL HERNIA REPAIR     "after they took tumor out of my colon I developed a hernia"   ABDOMINAL HYSTERECTOMY     BACK SURGERY     BREAST BIOPSY Right 2012   positive   BREAST BIOPSY Right 2014   positive   BREAST BIOPSY Right 06/26/2016   US guided - waiting for pathology  BREAST EXCISIONAL BIOPSY Right 1995   benign   CARPAL TUNNEL RELEASE Left    DILATION AND CURETTAGE OF UTERUS     S/P miscarriage   IR GENERIC HISTORICAL  04/04/2016   IR LUMBAR DISC ASPIRATION W/IMG GUIDE 04/04/2016 Sara Bras, MD [facility]   JOINT REPLACEMENT     LAPAROSCOPIC CHOLECYSTECTOMY  2016   REPLACEMENT TOTAL HIP W/  RESURFACING IMPLANTS  Bilateral    SKIN CANCER EXCISION     "right neck; mid forehead"   TOTAL HIP ARTHROPLASTY Right 10/03/2015   Procedure: TOTAL HIP ARTHROPLASTY ANTERIOR APPROACH;  Surgeon: Hessie Knows, MD;  Location: [facility] ORS;  Service: Orthopedics;  Laterality: Right;   TOTAL HIP ARTHROPLASTY Left 07/25/2016   Procedure: TOTAL HIP ARTHROPLASTY ANTERIOR APPROACH;  Surgeon: Hessie Knows, MD;  Location: [facility] OR[facility];  Service: Orthopedics;  Laterality: Left;   TUBAL LIGATION     TUMOR EXCISION  2012   "small intestine; came from the breast cancer"    FAMILY HISTORY: Reviewed and unchanged. No reported history of breast cancer or other chronic diseases.     ADVANCED DIRECTIVES:    HEALTH MAINTENANCE: Social History   Tobacco Use   Smoking status: Current Every Day Smoker    Packs/day: 1.00    Years: 54.00    Pack years: 54.00    Types: Cigarettes   Smokeless tobacco: Never Used   Tobacco comment: not ready to quit  Substance Use Topics   Alcohol use: No   Drug use: No     Colonoscopy:  PAP:  Bone density:  Lipid panel:  Allergies  Allergen Reactions   Carafate [Sucralfate] Nausea Only   Diclofenac Sodium Nausea Only   Erythromycin Diarrhea and Nausea And Vomiting   Adhesive [Tape] Itching and Rash   Wellbutrin [Bupropion] Itching and Rash    Current Outpatient Medications  Medication Sig Dispense Refill   acetaminophen (TYLENOL) 500 MG tablet Take 1,000 mg by mouth daily as needed for mild pain or fever.      albuterol (VENTOLIN HFA) 108 (90 Base) MCG/ACT inhaler Inhale 2 puffs into the lungs every 4 (four) hours as needed.     cetirizine (ZYRTEC) 10 MG tablet Take 10 mg by mouth daily as needed for allergies. Reported on 10/03/2015     citalopram (CELEXA) 20 MG tablet Take 20 mg by mouth every morning.      dicyclomine (BENTYL) 10 MG capsule Take 10 mg by mouth 3 (three) times daily as needed for spasms.     letrozole (FEMARA) 2.5 MG tablet Take 1 tablet (2.5 mg  total) by mouth daily. 90 tablet 4   levothyroxine (SYNTHROID, LEVOTHROID) 100 MCG tablet Take 100 mcg by mouth daily before breakfast.      montelukast (SINGULAIR) 10 MG tablet Take 10 mg by mouth at bedtime.      Multiple Vitamins-Minerals (CENTRUM SILVER PO) Take 1 tablet by mouth every morning.     omeprazole (PRILOSEC) 20 MG capsule Take 20 mg by mouth every morning.      palbociclib (IBRANCE) 100 MG tablet Take 1 tablet (100 mg total) by mouth daily. Take for 21 days on, 7 days off, repeat every 28 days. 21 tablet 4   pravastatin (PRAVACHOL) 40 MG tablet Take 40 mg by mouth at bedtime.      triamcinolone cream (KENALOG) 0.1 % APPLY TO AFFECTED AREA TWICE A DAY UNTIL CLEAR     valsartan-hydrochlorothiazide (DIOVAN-HCT) 80-12.5 MG tablet Take 1 tablet by mouth daily.  zolpidem (AMBIEN) 10 MG tablet TAKE 1 TABLET BY MOUTH EVERY DAY AT BEDTIME AS NEEDED FOR SLEEP 30 tablet 2   No current facility-administered medications for this visit.     OBJECTIVE: There were no vitals filed for this visit.   There is no height or weight on file to calculate BMI.    ECOG FS:0 - Asymptomatic  General: Well-developed, well-nourished, no acute distress. HEENT: Normocephalic. Neuro: Alert, answering all questions appropriately. Cranial nerves grossly intact. Psych: Normal affect.   LAB RESULTS: Lab Results  Component Value Date   NA 141 06/21/2019   K 3.6 06/21/2019   CL 104 06/21/2019   CO2 24 06/21/2019   GLUCOSE 133 (H) 06/21/2019   BUN 25 (H) 06/21/2019   CREATININE 1.29 (H) 06/21/2019   CALCIUM 9.1 06/21/2019   PROT 6.9 06/21/2019   ALBUMIN 4.1 06/21/2019   AST 21 06/21/2019   ALT 15 06/21/2019   ALKPHOS 89 06/21/2019   BILITOT 0.5 06/21/2019   GFRNONAA 41 (L) 06/21/2019   GFRAA 48 (L) 06/21/2019    Lab Results  Component Value Date   WBC 2.2 (L) 06/21/2019   NEUTROABS 1.4 (L) 06/21/2019   HGB 10.2 (L) 06/21/2019   HCT 29.8 (L) 06/21/2019   MCV 108.0 (H) 06/21/2019     PLT 111 (L) 06/21/2019    STUDIES: Ct Abdomen Pelvis W Contrast  Result Date: 07/02/2019 CLINICAL DATA:  Stage IV right breast cancer with history of small bowel, gallbladder and osseous metastases. Worsening low back pain and diarrhea. Restaging. EXAM: CT ABDOMEN AND PELVIS WITH CONTRAST TECHNIQUE: Multidetector CT imaging of the abdomen and pelvis was performed using the standard protocol following bolus administration of intravenous contrast. CONTRAST:  152mL OMNIPAQUE IOHEXOL 300 MG/ML  SOLN COMPARISON:  07/25/2018 CT abdomen/pelvis. FINDINGS: Lower chest: No significant pulmonary nodules or acute consolidative airspace disease. Paraseptal emphysema at the right lung base. Hepatobiliary: Normal liver size. No liver mass. Cholecystectomy. Bile ducts are stable and within normal post cholecystectomy limits. Pancreas: Normal, with no mass or duct dilation. Spleen: Normal size. No mass. Adrenals/Urinary Tract: Normal adrenals. Stable mild postradiation change in the medial upper kidneys bilaterally. Otherwise normal kidneys with no renal masses and no hydronephrosis. Bladder obscured by streak artifact from bilateral hip hardware. Stomach/Bowel: Normal non-distended stomach. Normal caliber small bowel with no small bowel wall thickening. Normal appendix. Oral contrast transits to the left colon. Moderate sigmoid diverticulosis with no appreciable large bowel wall thickening or acute pericolonic fat stranding, noting limited visualization of portions of distal colon due to streak artifact. Vascular/Lymphatic: Atherosclerotic abdominal aorta with stable ectatic 2.6 cm infrarenal abdominal aorta. Patent portal, splenic, hepatic and renal veins. No pathologically enlarged lymph nodes in the abdomen or pelvis. Reproductive: Status post hysterectomy, with no abnormal findings at the vaginal cuff noting limited visualization due to streak artifact. No adnexal mass. Other: No pneumoperitoneum. No ascites. Chronic  small fluid collection deep to midline ventral abdominal muscle wall is unchanged (series 2/image 37). No evidence of a recurrent ventral abdominal hernia. Musculoskeletal: Innumerable sclerotic lesions throughout the visualized spine, lower ribs and bilateral pelvic girdle, increased in size, number and sclerosis since 07/25/2018 CT. Representative 2.3 cm medial left iliac bone sclerotic lesion (series 2/image 52), increased from 0.7 cm. Representative 1.6 cm left pubic bone sclerotic lesion (series 2/image 73), new. Anterior upper sacral lytic lesion is unchanged. Sclerotic T11 vertebral 2.7 cm lesion is increased from 2.2 cm on 01/15/2019 chest CT angiogram study and 1.1 cm on 07/25/2018  CT. Marked lumbar spondylosis. Bilateral total hip arthroplasty. IMPRESSION: 1. Innumerable sclerotic osseous metastases throughout the visualized skeleton are increased in size and number, compatible with progression of osseous metastatic disease. 2. No lymphadenopathy or other findings of extraosseous metastatic disease in the abdomen or pelvis. 3. No evidence of bowel obstruction or acute bowel inflammation. Moderate sigmoid diverticulosis, with no evidence of acute diverticulitis. 4. Stable 2.6 cm ectatic infrarenal abdominal aorta, at risk for aneurysm development. Recommend follow-up aortic ultrasound in 5 years. This recommendation follows ACR consensus guidelines: White Paper of the ACR Incidental Findings Committee II on Vascular Findings. J Am Coll Radiol 2013; 10:789-794. 5. Aortic Atherosclerosis (ICD10-I70.0) and Emphysema (ICD10-J43.9). Electronically Signed   By: Ilona Sorrel M.D.   On: 07/02/2019 10:49   Vas [place] Burnard Bunting With/wo Tbi  Result Date: 06/08/2019 LOWER EXTREMITY DOPPLER STUDY Indications: Toe Cyanosis.  Performing Technologist: Almira Coaster RVS  Examination Guidelines: A complete evaluation includes at minimum, Doppler waveform signals and systolic blood pressure reading at the level of bilateral  brachial, anterior tibial, and posterior tibial arteries, when vessel segments are accessible. Bilateral testing is considered an integral part of a complete examination. Photoelectric Plethysmograph (PPG) waveforms and toe systolic pressure readings are included as required and additional duplex testing as needed. Limited examinations for reoccurring indications may be performed as noted.  ABI Findings: +---------+------------------+-----+---------+--------+  Right     Rt Pressure (mmHg) Index Waveform  Comment   +---------+------------------+-----+---------+--------+  Brachial  143                                          +---------+------------------+-----+---------+--------+  ATA       145                1.01  triphasic           +---------+------------------+-----+---------+--------+  PTA       138                0.97  triphasic           +---------+------------------+-----+---------+--------+  Great Toe 85                 0.59  Abnormal            +---------+------------------+-----+---------+--------+ +---------+------------------+-----+---------+-------+  Left      Lt Pressure (mmHg) Index Waveform  Comment  +---------+------------------+-----+---------+-------+  ATA       129                0.90  biphasic           +---------+------------------+-----+---------+-------+  PTA       146                1.02  triphasic          +---------+------------------+-----+---------+-------+  Great Toe 62                 0.43  Abnormal           +---------+------------------+-----+---------+-------+ +-------+-----------+-----------+------------+------------+  ABI/TBI Today's ABI Today's TBI Previous ABI Previous TBI  +-------+-----------+-----------+------------+------------+  Right   1.01        .59                                    +-------+-----------+-----------+------------+------------+  Left    1.02        .  43                                    +-------+-----------+-----------+------------+------------+  Summary:  Right: Resting right ankle-brachial index is within normal range. No evidence of significant right lower extremity arterial disease. The right toe-brachial index is abnormal. Left: Resting left ankle-brachial index is within normal range. No evidence of significant left lower extremity arterial disease. The left toe-brachial index is abnormal.  *See table(s) above for measurements and observations.  Electronically signed by Leotis Pain MD on 06/08/2019 at 5:28:28 PM.   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  PLAN:    1.  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  Repeat PET scan on January 29, 2019 reviewed independently with only mild increase in FDG accumulation consistent with a slowly increasing CA 27-29.  She was also noted to have a new lytic hypermetabolic lesion in her left iliac bone.  CT scan results from July 02, 2019 reviewed independently and reported as above with bony progression of her known disease as well as new bony lesions identified.  Her CA 27-29 continues to increase and is now greater than 60.0.  Will get a PET scan to further evaluate.  We also discussed the possibility of discontinuing Ibrance and transitioning to IV chemotherapy, possibly [place].  Patient will have a video assisted telemedicine visit 1 to 2 days after her PET scan results to discuss the results and treatment planning if needed.   2.  Hip/back pain: Chronic.  Patient is status post bilateral hip replacement. 3.  Anemia: Patient's hemoglobin continues to slowly trend down and is now 10.2. 4.  Renal insufficiency: Creatinine has improved to 1.29. 5.  Leukopenia: Chronic and unchanged.  Decreased secondary to Ibrance.  Continue with dose reduced  treatment as above. 6.  Thrombocytopenia: Mild, monitor. 7.  Abdominal pain/diarrhea: Patient does not complain of this today.  CT scan as above. 8.  Bony lesions: Patient will likely switch to IV chemotherapy and will include Zometa with her treatment plan.  I provided 25 minutes of face-to-face video visit time during this encounter, and > 50% was spent counseling as documented under my assessment & plan.   Patient expressed understanding and was in agreement with this plan. She also understands that She can call clinic at any time with any questions, concerns, or complaints.    Lloyd Huger, MD   07/06/2019 2:42 PM

## 2019-07-08 ENCOUNTER — Other Ambulatory Visit: Payer: Self-pay | Admitting: Oncology

## 2019-07-08 DIAGNOSIS — C801 Malignant (primary) neoplasm, unspecified: Secondary | ICD-10-CM

## 2019-07-08 DIAGNOSIS — C50911 Malignant neoplasm of unspecified site of right female breast: Secondary | ICD-10-CM

## 2019-07-09 ENCOUNTER — Other Ambulatory Visit: Payer: Self-pay | Admitting: *Deleted

## 2019-07-09 NOTE — Telephone Encounter (Signed)
This has been filled today already

## 2019-07-14 NOTE — Progress Notes (Signed)
[place]  [phone] [phone]  ID: Sara David OB: October 29, 1945  MR#: [id number]  [id number]  [name], [name] Hessie Knows, MD as Consulting Physician (Orthopedic Surgery) Murlean Iba, MD (Nephrology)   I connected with Sara David on 07/21/19 at  1:00 PM EST by video enabled telemedicine visit and verified that I am speaking with the correct person using two identifiers.   I discussed the limitations, risks, security and privacy concerns of performing an evaluation and management service by telemedicine and the availability of in-person appointments. I also discussed with the patient that there may be a patient responsible charge related to this service. The patient expressed understanding and agreed to proceed.   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  INTERVAL HISTORY: Patient agreed to video enabled telemedicine visit for further evaluation, discussion of her PET scan results, and treatment planning.  She continues to have low back pain, but otherwise feels well.  She has no neurologic complaints.  She denies any recent fevers or illnesses.  She denies any chest pain, shortness of breath, cough, or hemoptysis.  She has a fair appetite and denies weight loss.  She denies any nausea, vomiting, constipation, or diarrhea.  She has no melena or hematochezia.  She has no urinary complaints.  Patient offers no specific complaints today.  REVIEW OF SYSTEMS:   Review of Systems  Constitutional: Negative.  Negative for fever, malaise/fatigue and weight loss.  Respiratory: Negative.  Negative for cough and shortness of breath.   Cardiovascular:  Negative.  Negative for chest pain and leg swelling.  Gastrointestinal: Negative.  Negative for abdominal pain, blood in stool, diarrhea and melena.  Genitourinary: Negative.   Musculoskeletal: Negative.  Negative for back pain and joint pain.  Skin: Negative.  Negative for rash.  Neurological: Negative.  Negative for dizziness, sensory change, focal weakness, weakness and headaches.  Psychiatric/Behavioral: Negative.  The patient is not nervous/anxious.     As per HPI. Otherwise, a complete review of systems is negative.  PAST MEDICAL HISTORY: Past Medical History:  Diagnosis Date   Anxiety    Arthritis    "back" (04/03/2016)   Breast cancer ([place]) 2012    no lumpectomy. Treating with meds- Right   Cancer ([place])    small intestines 2012, gallbladder 2016   Chronic lower back pain    COPD (chronic obstructive pulmonary disease) ([place])    "use inhalers for COPD that dx'd at one time" (04/03/2016)   Family history of adverse reaction to anesthesia    daughter gets nauseated   GERD (gastroesophageal reflux disease)    High cholesterol    Hypertension    Hypothyroidism    Skin cancer    "right neck; mid forehead"    PAST SURGICAL HISTORY: Past Surgical History:  Procedure Laterality Date   ABDOMINAL HERNIA REPAIR     "after they took tumor out of my colon I developed a hernia"   ABDOMINAL HYSTERECTOMY     BACK SURGERY     BREAST BIOPSY Right 2012   positive   BREAST BIOPSY Right 2014   positive   BREAST BIOPSY Right 06/26/2016   US guided - waiting for pathology   BREAST EXCISIONAL BIOPSY Right 1995  benign   CARPAL TUNNEL RELEASE Left    DILATION AND CURETTAGE OF UTERUS     S/P miscarriage   IR GENERIC HISTORICAL  04/04/2016   IR LUMBAR DISC ASPIRATION W/IMG GUIDE 04/04/2016 Sara Bras, MD [facility]   JOINT REPLACEMENT     LAPAROSCOPIC CHOLECYSTECTOMY  2016   REPLACEMENT TOTAL HIP W/  RESURFACING IMPLANTS Bilateral    SKIN CANCER  EXCISION     "right neck; mid forehead"   TOTAL HIP ARTHROPLASTY Right 10/03/2015   Procedure: TOTAL HIP ARTHROPLASTY ANTERIOR APPROACH;  Surgeon: Hessie Knows, MD;  Location: [facility] ORS;  Service: Orthopedics;  Laterality: Right;   TOTAL HIP ARTHROPLASTY Left 07/25/2016   Procedure: TOTAL HIP ARTHROPLASTY ANTERIOR APPROACH;  Surgeon: Hessie Knows, MD;  Location: [facility] ORS;  Service: Orthopedics;  Laterality: Left;   TUBAL LIGATION     TUMOR EXCISION  2012   "small intestine; came from the breast cancer"    FAMILY HISTORY: Reviewed and unchanged. No reported history of breast cancer or other chronic diseases.     ADVANCED DIRECTIVES:    HEALTH MAINTENANCE: Social History   Tobacco Use   Smoking status: Current Every Day Smoker    Packs/day: 1.00    Years: 54.00    Pack years: 54.00    Types: Cigarettes   Smokeless tobacco: Never Used   Tobacco comment: not ready to quit  Substance Use Topics   Alcohol use: No   Drug use: No     Colonoscopy:  PAP:  Bone density:  Lipid panel:  Allergies  Allergen Reactions   Carafate [Sucralfate] Nausea Only   Diclofenac Sodium Nausea Only   Erythromycin Diarrhea and Nausea And Vomiting   Adhesive [Tape] Itching and Rash   Wellbutrin [Bupropion] Itching and Rash    Current Outpatient Medications  Medication Sig Dispense Refill   acetaminophen (TYLENOL) 500 MG tablet Take 1,000 mg by mouth daily as needed for mild pain or fever.      albuterol (VENTOLIN HFA) 108 (90 Base) MCG/ACT inhaler Inhale 2 puffs into the lungs every 4 (four) hours as needed.     cetirizine (ZYRTEC) 10 MG tablet Take 10 mg by mouth daily as needed for allergies. Reported on 10/03/2015     citalopram (CELEXA) 20 MG tablet Take 20 mg by mouth every morning.      dicyclomine (BENTYL) 10 MG capsule Take 10 mg by mouth 3 (three) times daily as needed for spasms.     IBRANCE 100 MG tablet TAKE 1 TABLET (100 MG TOTAL) BY MOUTH DAILY. TAKE FOR 21 DAYS  ON, 7 DAYS OFF, REPEAT EVERY 28 DAYS. 21 tablet 4   letrozole (FEMARA) 2.5 MG tablet Take 1 tablet (2.5 mg total) by mouth daily. 90 tablet 4   levothyroxine (SYNTHROID, LEVOTHROID) 100 MCG tablet Take 100 mcg by mouth daily before breakfast.      montelukast (SINGULAIR) 10 MG tablet Take 10 mg by mouth at bedtime.      Multiple Vitamins-Minerals (CENTRUM SILVER PO) Take 1 tablet by mouth every morning.     omeprazole (PRILOSEC) 20 MG capsule Take 20 mg by mouth every morning.      pravastatin (PRAVACHOL) 40 MG tablet Take 40 mg by mouth at bedtime.      triamcinolone cream (KENALOG) 0.1 % APPLY TO AFFECTED AREA TWICE A DAY UNTIL CLEAR     TYLENOL 325 MG tablet      valsartan-hydrochlorothiazide (DIOVAN-HCT) 80-12.5 MG tablet Take 1 tablet by mouth daily.  WIXELA INHUB 100-50 MCG/DOSE AEPB INHALE 1 INHALATION INTO THE LUNGS EVERY 12 (TWELVE) HOURS     zolpidem (AMBIEN) 10 MG tablet TAKE 1 TABLET BY MOUTH AT BEDTIME AS NEEDED FOR SLEEP 30 tablet 2   No current facility-administered medications for this visit.     OBJECTIVE: There were no vitals filed for this visit.   There is no height or weight on file to calculate BMI.    ECOG FS:0 - Asymptomatic  General: Well-developed, well-nourished, no acute distress. HEENT: Normocephalic. Neuro: Alert, answering all questions appropriately. Cranial nerves grossly intact. Psych: Normal affect.  LAB RESULTS: Lab Results  Component Value Date   NA 141 06/21/2019   K 3.6 06/21/2019   CL 104 06/21/2019   CO2 24 06/21/2019   GLUCOSE 133 (H) 06/21/2019   BUN 25 (H) 06/21/2019   CREATININE 1.29 (H) 06/21/2019   CALCIUM 9.1 06/21/2019   PROT 6.9 06/21/2019   ALBUMIN 4.1 06/21/2019   AST 21 06/21/2019   ALT 15 06/21/2019   ALKPHOS 89 06/21/2019   BILITOT 0.5 06/21/2019   GFRNONAA 41 (L) 06/21/2019   GFRAA 48 (L) 06/21/2019    Lab Results  Component Value Date   WBC 2.2 (L) 06/21/2019   NEUTROABS 1.4 (L) 06/21/2019   HGB  10.2 (L) 06/21/2019   HCT 29.8 (L) 06/21/2019   MCV 108.0 (H) 06/21/2019   PLT 111 (L) 06/21/2019    STUDIES: Ct Abdomen Pelvis W Contrast  Result Date: 07/02/2019 CLINICAL DATA:  Stage IV right breast cancer with history of small bowel, gallbladder and osseous metastases. Worsening low back pain and diarrhea. Restaging. EXAM: CT ABDOMEN AND PELVIS WITH CONTRAST TECHNIQUE: Multidetector CT imaging of the abdomen and pelvis was performed using the standard protocol following bolus administration of intravenous contrast. CONTRAST:  156mL OMNIPAQUE IOHEXOL 300 MG/ML  SOLN COMPARISON:  07/25/2018 CT abdomen/pelvis. FINDINGS: Lower chest: No significant pulmonary nodules or acute consolidative airspace disease. Paraseptal emphysema at the right lung base. Hepatobiliary: Normal liver size. No liver mass. Cholecystectomy. Bile ducts are stable and within normal post cholecystectomy limits. Pancreas: Normal, with no mass or duct dilation. Spleen: Normal size. No mass. Adrenals/Urinary Tract: Normal adrenals. Stable mild postradiation change in the medial upper kidneys bilaterally. Otherwise normal kidneys with no renal masses and no hydronephrosis. Bladder obscured by streak artifact from bilateral hip hardware. Stomach/Bowel: Normal non-distended stomach. Normal caliber small bowel with no small bowel wall thickening. Normal appendix. Oral contrast transits to the left colon. Moderate sigmoid diverticulosis with no appreciable large bowel wall thickening or acute pericolonic fat stranding, noting limited visualization of portions of distal colon due to streak artifact. Vascular/Lymphatic: Atherosclerotic abdominal aorta with stable ectatic 2.6 cm infrarenal abdominal aorta. Patent portal, splenic, hepatic and renal veins. No pathologically enlarged lymph nodes in the abdomen or pelvis. Reproductive: Status post hysterectomy, with no abnormal findings at the vaginal cuff noting limited visualization due to streak  artifact. No adnexal mass. Other: No pneumoperitoneum. No ascites. Chronic small fluid collection deep to midline ventral abdominal muscle wall is unchanged (series 2/image 37). No evidence of a recurrent ventral abdominal hernia. Musculoskeletal: Innumerable sclerotic lesions throughout the visualized spine, lower ribs and bilateral pelvic girdle, increased in size, number and sclerosis since 07/25/2018 CT. Representative 2.3 cm medial left iliac bone sclerotic lesion (series 2/image 52), increased from 0.7 cm. Representative 1.6 cm left pubic bone sclerotic lesion (series 2/image 73), new. Anterior upper sacral lytic lesion is unchanged. Sclerotic T11 vertebral 2.7 cm lesion is  increased from 2.2 cm on 01/15/2019 chest CT angiogram study and 1.1 cm on 07/25/2018 CT. Marked lumbar spondylosis. Bilateral total hip arthroplasty. IMPRESSION: 1. Innumerable sclerotic osseous metastases throughout the visualized skeleton are increased in size and number, compatible with progression of osseous metastatic disease. 2. No lymphadenopathy or other findings of extraosseous metastatic disease in the abdomen or pelvis. 3. No evidence of bowel obstruction or acute bowel inflammation. Moderate sigmoid diverticulosis, with no evidence of acute diverticulitis. 4. Stable 2.6 cm ectatic infrarenal abdominal aorta, at risk for aneurysm development. Recommend follow-up aortic ultrasound in 5 years. This recommendation follows ACR consensus guidelines: White Paper of the ACR Incidental Findings Committee II on Vascular Findings. J Am Coll Radiol 2013; 10:789-794. 5. Aortic Atherosclerosis (ICD10-I70.0) and Emphysema (ICD10-J43.9). Electronically Signed   By: Ilona Sorrel M.D.   On: 07/02/2019 10:49   Nm Pet Image Restag (ps) Skull Base To Thigh  Result Date: 07/19/2019 CLINICAL DATA:  Subsequent treatment strategy for breast cancer. EXAM: NUCLEAR MEDICINE PET SKULL BASE TO THIGH TECHNIQUE: 7.9 mCi F-18 FDG was injected  intravenously. Full-ring PET imaging was performed from the skull base to thigh after the radiotracer. CT data was obtained and used for attenuation correction and anatomic localization. Fasting blood glucose: 91 mg/dl COMPARISON:  01/29/2019 FINDINGS: Mediastinal blood pool activity: SUV max 2.4 Liver activity: SUV max NA NECK: No hypermetabolic lymph nodes in the neck. Incidental CT findings: none CHEST: No hypermetabolic mediastinal or hilar nodes. No suspicious pulmonary nodules on the CT scan. Incidental CT findings: Coronary artery calcification is evident. Atherosclerotic calcification is noted in the wall of the thoracic aorta. ABDOMEN/PELVIS: No abnormal hypermetabolic activity within the liver, pancreas, adrenal glands, or spleen. No hypermetabolic lymph nodes in the abdomen or pelvis. New intense hypermetabolism the region of the ileocecal valve, new in the interval. This would represent relatively rapid interval appearance of an adenoma other neoplasm. Physiologic uptake is favored. Low level uptake in the anterior abdominal wall at the chronic disklike fluid collection is similar to prior. Incidental CT findings: There is abdominal aortic atherosclerosis without aneurysm. Left colonic diverticulosis noted. Inferior pelvis largely obscured by beam hardening artifact from bilateral hip replacement. SKELETON: Dominant hypermetabolic bone lesion remains in the sacrum with SUV max = 10.0 today compared to 12.5 previously. Hypermetabolic lesion in the right T5 transverse process demonstrates SUV max = 4.5. This lesion appears new since prior PET-CT. Hypermetabolic lesion in the left T4 pedicle demonstrates SUV max = 4.8 today compared to 3.8 previously. Posterior left iliac lesion identified as new on the prior study with SUV max = 4.1 now demonstrates SUV max = 6.3. This lesion is sclerotic and measures 2.2 cm today compared to 1.8 cm previously. Hypermetabolic lesion in the T3 vertebral body demonstrates  SUV max = 4.1 and is new in the interval. Incidental CT findings: Diffuse lytic and sclerotic bone metastases again noted. IMPRESSION: 1. New foci of hypermetabolism identified in the skeleton suggesting mild interval progression of bony metastatic disease. This hypermetabolism is on a background of widespread lytic and sclerotic osseous metastatic disease, most of which is not hypermetabolic. 2. New focal hypermetabolism in the cecum, the level of the ileocecal bowel. This would be relatively rapid appearance for colonic neoplasm and may simply be physiologic. Close attention on follow-up recommended. Electronically Signed   By: Misty Stanley M.D.   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  PLAN:    1.  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  PET scan results from July 19, 2019 reviewed independently and reported as above with new metastatic lesions in bone.  Patient has no evidence of visceral disease.  She also has a slowly increasing CA 27-29.  After lengthy discussion, patient agreed to discontinue [facility] and will initiate treatment with [place] after the holidays in January 2021.  She will receive treatment on days 1 and 8 with a 15 off.  Plan to repeat PET scan after approximately 4 cycles.  Return to clinic on August 24, 2019 to initiate cycle 1, day 1 of [place].   2.  Hip/back pain: Chronic and unchanged.  Patient is status post bilateral hip replacement. 3.  Anemia: Patient's most recent hemoglobin is 10.2.  Monitor.   4.  Renal insufficiency: Creatinine has improved to 1.29. 5.  Leukopenia: Chronic and unchanged.  Decreased secondary to Ibrance.  Monitor closely with switching treatments. 6.  Thrombocytopenia:  Mild, monitor. 7.  Abdominal pain/diarrhea: Resolved. 8.  Bony lesions: Will include monthly Zometa with her treatments.  I provided 25 minutes of face-to-face video visit time during this encounter, and > 50% was spent counseling as documented under my assessment & plan.   Patient expressed understanding and was in agreement with this plan. She also understands that She can call clinic at any time with any questions, concerns, or complaints.    Lloyd Huger, MD   07/21/2019 6:42 AM

## 2019-07-19 ENCOUNTER — Other Ambulatory Visit: Payer: Self-pay | Admitting: Oncology

## 2019-07-19 ENCOUNTER — Encounter: Payer: Self-pay | Admitting: Oncology

## 2019-07-19 ENCOUNTER — Encounter
Admission: RE | Admit: 2019-07-19 | Discharge: 2019-07-19 | Disposition: A | Discharge status: Home / Self Care | Payer: Medicare Other | Source: Ambulatory Visit | Attending: Oncology | Admitting: Oncology

## 2019-07-19 ENCOUNTER — Other Ambulatory Visit: Payer: Self-pay

## 2019-07-19 DIAGNOSIS — I10 Essential (primary) hypertension: Secondary | ICD-10-CM | POA: Diagnosis not present

## 2019-07-19 DIAGNOSIS — C50919 Malignant neoplasm of unspecified site of unspecified female breast: Secondary | ICD-10-CM | POA: Insufficient documentation

## 2019-07-19 DIAGNOSIS — Z85068 Personal history of other malignant neoplasm of small intestine: Secondary | ICD-10-CM | POA: Insufficient documentation

## 2019-07-19 DIAGNOSIS — Z79899 Other long term (current) drug therapy: Secondary | ICD-10-CM | POA: Insufficient documentation

## 2019-07-19 DIAGNOSIS — J449 Chronic obstructive pulmonary disease, unspecified: Secondary | ICD-10-CM | POA: Insufficient documentation

## 2019-07-19 DIAGNOSIS — Z85828 Personal history of other malignant neoplasm of skin: Secondary | ICD-10-CM | POA: Insufficient documentation

## 2019-07-19 DIAGNOSIS — F17210 Nicotine dependence, cigarettes, uncomplicated: Secondary | ICD-10-CM | POA: Insufficient documentation

## 2019-07-19 LAB — GLUCOSE, CAPILLARY: Glucose-Capillary: 91 mg/dL (ref 70–99)

## 2019-07-19 MED ORDER — FLUDEOXYGLUCOSE F - 18 (FDG) INJECTION
7.4000 | Freq: Once | INTRAVENOUS | Status: AC | PRN
  Administered 2019-07-19: 10:00:00 7.87 via INTRAVENOUS

## 2019-07-19 NOTE — Progress Notes (Signed)
Patient prescreened for appointment. Patient has no concerns or questions.  

## 2019-07-20 ENCOUNTER — Inpatient Hospital Stay: Payer: Medicare Other | Attending: Oncology | Admitting: Oncology

## 2019-07-20 DIAGNOSIS — C50911 Malignant neoplasm of unspecified site of right female breast: Secondary | ICD-10-CM | POA: Diagnosis not present

## 2019-07-20 NOTE — Progress Notes (Signed)
START ON PATHWAY REGIMEN - Breast     A cycle is every 21 days:     Eribulin mesylate   **Always confirm dose/schedule in your pharmacy ordering system**  Patient Characteristics: Distant Metastases or Locoregional Recurrent Disease - Unresected or Locally Advanced Unresectable Disease Progressing after Neoadjuvant and Local Therapies, HER2 Negative/Unknown/Equivocal, ER Positive, Chemotherapy, Second Line Therapeutic Status: Distant Metastases BRCA Mutation Status: Absent ER Status: Positive (+) HER2 Status: Negative (-) PR Status: Positive (+) Line of Therapy: Second Line Intent of Therapy: Non-Curative / Palliative Intent, Discussed with Patient

## 2019-07-20 NOTE — Progress Notes (Signed)
Patient on plan of care prior to pathways. 

## 2019-08-12 ENCOUNTER — Other Ambulatory Visit: Payer: Self-pay | Admitting: Oncology

## 2019-08-12 DIAGNOSIS — Z7189 Other specified counseling: Secondary | ICD-10-CM | POA: Insufficient documentation

## 2019-08-18 ENCOUNTER — Other Ambulatory Visit: Payer: Self-pay | Admitting: Oncology

## 2019-08-18 DIAGNOSIS — C50911 Malignant neoplasm of unspecified site of right female breast: Secondary | ICD-10-CM

## 2019-08-18 MED ORDER — PROCHLORPERAZINE MALEATE 10 MG PO TABS: 10.0000 mg | ORAL_TABLET | Freq: Four times a day (QID) | ORAL | 1 refills | Status: AC | PRN

## 2019-08-18 MED ORDER — ONDANSETRON HCL 8 MG PO TABS: 8.0000 mg | ORAL_TABLET | Freq: Two times a day (BID) | ORAL | 1 refills | Status: AC | PRN

## 2019-08-18 NOTE — Progress Notes (Signed)
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  INTERVAL HISTORY: Patient returns to clinic today for further evaluation and consideration of cycle 1, day 1 of Halaven.  She has chronic low back pain, but otherwise feels well today.  She has no neurologic complaints.  She denies any recent fevers or illnesses.  She denies any chest pain, shortness of breath, cough, or hemoptysis.  She has a fair appetite and denies weight loss.  She denies any nausea, vomiting, constipation, or diarrhea.  She has no melena or hematochezia.  She has no urinary complaints.  Patient offers no further specific complaints today.  REVIEW OF SYSTEMS:   Review of Systems  Constitutional: Negative.  Negative for fever, malaise/fatigue and weight loss.  Respiratory: Negative.  Negative for cough and shortness of breath.   Cardiovascular: Negative.  Negative for chest pain and leg swelling.  Gastrointestinal: Negative.  Negative for abdominal pain, blood in stool, diarrhea and melena.  Genitourinary: Negative.   Musculoskeletal: Positive for back pain. Negative for joint pain.  Skin: Negative.  Negative for rash.  Neurological: Negative.  Negative for dizziness, sensory change, focal weakness, weakness and headaches.  Psychiatric/Behavioral: Negative.  The patient is not nervous/anxious.     As per HPI. Otherwise, a complete review of systems is negative.  PAST MEDICAL HISTORY: Past Medical History:  Diagnosis Date  . Anxiety   . Arthritis    "back" (04/03/2016)  . Breast cancer ([place]) 2012    no lumpectomy.  Treating with meds- Right  . Cancer ([place])    small intestines 2012, gallbladder 2016  . Chronic lower back pain   . COPD (chronic obstructive pulmonary disease) ([place])    "use inhalers for COPD that dx'd at one time" (04/03/2016)  . Family history of adverse reaction to anesthesia    daughter gets nauseated  . GERD (gastroesophageal reflux disease)   . High cholesterol   . Hypertension   . Hypothyroidism   . Skin cancer    "right neck; mid forehead"    PAST SURGICAL HISTORY: Past Surgical History:  Procedure Laterality Date  . ABDOMINAL HERNIA REPAIR     "after they took tumor out of my colon I developed a hernia"  . ABDOMINAL HYSTERECTOMY    . BACK SURGERY    . BREAST BIOPSY Right 2012   positive  . BREAST BIOPSY Right 2014   positive  . BREAST BIOPSY Right 06/26/2016   US guided - waiting for pathology  . BREAST EXCISIONAL BIOPSY Right 1995   benign  . CARPAL TUNNEL RELEASE Left   . DILATION AND CURETTAGE OF UTERUS     S/P miscarriage  . IR GENERIC HISTORICAL  04/04/2016   IR LUMBAR DISC ASPIRATION W/IMG GUIDE 04/04/2016 [name], MD [facility]  . JOINT REPLACEMENT    . LAPAROSCOPIC CHOLECYSTECTOMY  2016  . REPLACEMENT TOTAL HIP W/  RESURFACING IMPLANTS Bilateral   . SKIN CANCER EXCISION     "right neck; mid forehead"  . TOTAL HIP ARTHROPLASTY Right 10/03/2015   Procedure: TOTAL HIP ARTHROPLASTY ANTERIOR APPROACH;  Surgeon: Hessie Knows, MD;  Location: [facility] ORS;  Service: Orthopedics;  Laterality:  Right;  Marland Kitchen TOTAL HIP ARTHROPLASTY Left 07/25/2016   Procedure: TOTAL HIP ARTHROPLASTY ANTERIOR APPROACH;  Surgeon: Hessie Knows, MD;  Location: [facility] ORS;  Service: Orthopedics;  Laterality: Left;  . TUBAL LIGATION    . TUMOR EXCISION  2012   "small intestine; came from the breast cancer"    FAMILY HISTORY: Reviewed and unchanged. No reported history of breast cancer or other chronic diseases.     ADVANCED DIRECTIVES:    HEALTH MAINTENANCE: Social History    Tobacco Use  . Smoking status: Current Every Day Smoker    Packs/day: 1.00    Years: 54.00    Pack years: 54.00    Types: Cigarettes  . Smokeless tobacco: Never Used  . Tobacco comment: not ready to quit  Substance Use Topics  . Alcohol use: No  . Drug use: No     Colonoscopy:  PAP:  Bone density:  Lipid panel:  Allergies  Allergen Reactions  . Carafate [Sucralfate] Nausea Only  . Diclofenac Sodium Nausea Only  . Erythromycin Diarrhea and Nausea And Vomiting  . Adhesive [Tape] Itching and Rash  . Wellbutrin [Bupropion] Itching and Rash    Current Outpatient Medications  Medication Sig Dispense Refill  . acetaminophen (TYLENOL) 500 MG tablet Take 1,000 mg by mouth daily as needed for mild pain or fever.     Marland Kitchen albuterol (VENTOLIN HFA) 108 (90 Base) MCG/ACT inhaler Inhale 2 puffs into the lungs every 4 (four) hours as needed.    . cetirizine (ZYRTEC) 10 MG tablet Take 10 mg by mouth daily as needed for allergies. Reported on 10/03/2015    . citalopram (CELEXA) 20 MG tablet Take 20 mg by mouth every morning.     . dicyclomine (BENTYL) 10 MG capsule Take 10 mg by mouth 3 (three) times daily as needed for spasms.    Marland Kitchen letrozole (FEMARA) 2.5 MG tablet Take 1 tablet (2.5 mg total) by mouth daily. 90 tablet 4  . levothyroxine (SYNTHROID, LEVOTHROID) 100 MCG tablet Take 100 mcg by mouth daily before breakfast.     . montelukast (SINGULAIR) 10 MG tablet Take 10 mg by mouth at bedtime.     . Multiple Vitamins-Minerals (CENTRUM SILVER PO) Take 1 tablet by mouth every morning.    Marland Kitchen omeprazole (PRILOSEC) 20 MG capsule Take 20 mg by mouth every morning.     . ondansetron (ZOFRAN) 8 MG tablet Take 1 tablet (8 mg total) by mouth 2 (two) times daily as needed (Nausea or vomiting). 30 tablet 1  . pravastatin (PRAVACHOL) 40 MG tablet Take 40 mg by mouth at bedtime.     . prochlorperazine (COMPAZINE) 10 MG tablet Take 1 tablet (10 mg total) by mouth every 6 (six) hours as needed (Nausea or  vomiting). 30 tablet 1  . triamcinolone cream (KENALOG) 0.1 % APPLY TO AFFECTED AREA TWICE A DAY UNTIL CLEAR    . TYLENOL 325 MG tablet     . valsartan-hydrochlorothiazide (DIOVAN-HCT) 80-12.5 MG tablet Take 1 tablet by mouth daily.    Grant Ruts INHUB 100-50 MCG/DOSE AEPB INHALE 1 INHALATION INTO THE LUNGS EVERY 12 (TWELVE) HOURS    . zolpidem (AMBIEN) 10 MG tablet TAKE 1 TABLET BY MOUTH AT BEDTIME AS NEEDED FOR SLEEP 30 tablet 2  . IBRANCE 100 MG tablet TAKE 1 TABLET (100 MG TOTAL) BY MOUTH DAILY. TAKE FOR 21 DAYS ON, 7 DAYS OFF, REPEAT EVERY 28 DAYS. (Patient not taking: Reported on 08/23/2019) 21 tablet 4   No current facility-administered medications  for this visit.    OBJECTIVE: Vitals:   08/24/19 0920  BP: (!) 127/50  Pulse: 65  Temp: (!) 97.2 F (36.2 C)     Body mass index is 25.03 kg/m.    ECOG FS:0 - Asymptomatic  General: Well-developed, well-nourished, no acute distress. Eyes: Pink conjunctiva, anicteric sclera. HEENT: Normocephalic, moist mucous membranes. Lungs: No audible wheezing or coughing. Heart: Regular rate and rhythm. Abdomen: Soft, nontender, no obvious distention. Musculoskeletal: No edema, cyanosis, or clubbing. Neuro: Alert, answering all questions appropriately. Cranial nerves grossly intact. Skin: No rashes or petechiae noted. Psych: Normal affect.   LAB RESULTS: Lab Results  Component Value Date   NA 138 08/24/2019   K 4.2 08/24/2019   CL 101 08/24/2019   CO2 26 08/24/2019   GLUCOSE 124 (H) 08/24/2019   BUN 28 (H) 08/24/2019   CREATININE 1.23 (H) 08/24/2019   CALCIUM 9.2 08/24/2019   PROT 7.3 08/24/2019   ALBUMIN 3.6 08/24/2019   AST 38 08/24/2019   ALT 29 08/24/2019   ALKPHOS 170 (H) 08/24/2019   BILITOT 0.4 08/24/2019   GFRNONAA 43 (L) 08/24/2019   GFRAA 50 (L) 08/24/2019    Lab Results  Component Value Date   WBC 6.4 08/24/2019   NEUTROABS 4.6 08/24/2019   HGB 10.2 (L) 08/24/2019   HCT 31.8 (L) 08/24/2019   MCV 106.4 (H)  08/24/2019   PLT 269 08/24/2019    STUDIES: No results found.  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  PLAN:    1.  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  PET scan results from July 19, 2019 reviewed independently and reported as above with new metastatic lesions in bone.  Patient has no evidence of visceral disease.  She also has a slowly increasing CA 27-29.  After lengthy discussion, patient agreed to discontinue Ibrance letrozole.  Patient will receive Halaven on days 1 and 8 with a 15 off.  Plan to repeat PET scan after approximately 4 cycles.  Proceed with cycle 1, day 1 of treatment today.  Return to clinic in 1 week for further evaluation and consideration of cycle 1, day 8.   2.  Hip/back pain: Chronic and unchanged.  Patient is status post bilateral hip replacement. 3.  Anemia: Chronic and unchanged.  Monitor. 4.  Renal insufficiency: Patient's most recent creatinine has improved to 1.29.  Continue follow-up with nephrology as indicated. 5.  Leukopenia: Resolved. 6.  Thrombocytopenia: Resolved. 7.  Bony lesions: Will include monthly Zometa with her treatments.   Patient expressed understanding and was in agreement with this plan. She also understands that She can call clinic at any time with any questions, concerns, or complaints.    Lloyd Huger, MD   08/24/2019 10:10 AM

## 2019-08-23 ENCOUNTER — Other Ambulatory Visit: Payer: Self-pay

## 2019-08-23 NOTE — Progress Notes (Signed)
Patient pre screened for office appointment, no questions or concerns today. Patient reminded of upcoming appointment time and date. 

## 2019-08-24 ENCOUNTER — Inpatient Hospital Stay (HOSPITAL_BASED_OUTPATIENT_CLINIC_OR_DEPARTMENT_OTHER): Payer: Medicare Other | Admitting: Oncology

## 2019-08-24 ENCOUNTER — Inpatient Hospital Stay: Payer: Medicare Other

## 2019-08-24 ENCOUNTER — Inpatient Hospital Stay: Payer: Medicare Other | Attending: Oncology

## 2019-08-24 ENCOUNTER — Other Ambulatory Visit: Payer: Self-pay

## 2019-08-24 ENCOUNTER — Other Ambulatory Visit: Payer: Self-pay | Admitting: Oncology

## 2019-08-24 VITALS — BP 127/50 | HR 65 | Temp 97.2°F | Wt 145.8 lb

## 2019-08-24 DIAGNOSIS — C50911 Malignant neoplasm of unspecified site of right female breast: Secondary | ICD-10-CM

## 2019-08-24 DIAGNOSIS — C7951 Secondary malignant neoplasm of bone: Secondary | ICD-10-CM | POA: Insufficient documentation

## 2019-08-24 DIAGNOSIS — C50411 Malignant neoplasm of upper-outer quadrant of right female breast: Secondary | ICD-10-CM | POA: Insufficient documentation

## 2019-08-24 DIAGNOSIS — Z5111 Encounter for antineoplastic chemotherapy: Secondary | ICD-10-CM | POA: Diagnosis present

## 2019-08-24 DIAGNOSIS — C50919 Malignant neoplasm of unspecified site of unspecified female breast: Secondary | ICD-10-CM

## 2019-08-24 LAB — COMPREHENSIVE METABOLIC PANEL
ALT: 29 U/L (ref 0–44)
AST: 38 U/L (ref 15–41)
Albumin: 3.6 g/dL (ref 3.5–5.0)
Alkaline Phosphatase: 170 U/L — ABNORMAL HIGH (ref 38–126)
Anion gap: 11 (ref 5–15)
BUN: 28 mg/dL — ABNORMAL HIGH (ref 8–23)
CO2: 26 mmol/L (ref 22–32)
Calcium: 9.2 mg/dL (ref 8.9–10.3)
Chloride: 101 mmol/L (ref 98–111)
Creatinine, Ser: 1.23 mg/dL — ABNORMAL HIGH (ref 0.44–1.00)
GFR calc Af Amer: 50 mL/min — ABNORMAL LOW (ref >60)
GFR calc non Af Amer: 43 mL/min — ABNORMAL LOW (ref >60)
Glucose, Bld: 124 mg/dL — ABNORMAL HIGH (ref 70–99)
Potassium: 4.2 mmol/L (ref 3.5–5.1)
Sodium: 138 mmol/L (ref 135–145)
Total Bilirubin: 0.4 mg/dL (ref 0.3–1.2)
Total Protein: 7.3 g/dL (ref 6.5–8.1)

## 2019-08-24 LAB — CBC WITH DIFFERENTIAL/PLATELET
Abs Immature Granulocytes: 0.08 10*3/uL — ABNORMAL HIGH (ref 0.00–0.07)
Basophils Absolute: 0.1 10*3/uL (ref 0.0–0.1)
Basophils Relative: 1 %
Eosinophils Absolute: 0.2 10*3/uL (ref 0.0–0.5)
Eosinophils Relative: 3 %
HCT: 31.8 % — ABNORMAL LOW (ref 36.0–46.0)
Hemoglobin: 10.2 g/dL — ABNORMAL LOW (ref 12.0–15.0)
Immature Granulocytes: 1 %
Lymphocytes Relative: 15 %
Lymphs Abs: 0.9 10*3/uL (ref 0.7–4.0)
MCH: 34.1 pg — ABNORMAL HIGH (ref 26.0–34.0)
MCHC: 32.1 g/dL (ref 30.0–36.0)
MCV: 106.4 fL — ABNORMAL HIGH (ref 80.0–100.0)
Monocytes Absolute: 0.5 10*3/uL (ref 0.1–1.0)
Monocytes Relative: 7 %
Neutro Abs: 4.6 10*3/uL (ref 1.7–7.7)
Neutrophils Relative %: 73 %
Platelets: 269 10*3/uL (ref 150–400)
RBC: 2.99 MIL/uL — ABNORMAL LOW (ref 3.87–5.11)
RDW: 13 % (ref 11.5–15.5)
WBC: 6.4 10*3/uL (ref 4.0–10.5)
nRBC: 0 % (ref 0.0–0.2)

## 2019-08-24 MED ORDER — PROCHLORPERAZINE MALEATE 10 MG PO TABS
10.0000 mg | ORAL_TABLET | Freq: Once | ORAL | Status: AC
  Administered 2019-08-24: 11:00:00 10 mg via ORAL
  Filled 2019-08-24: qty 1

## 2019-08-24 MED ORDER — SODIUM CHLORIDE 0.9 % IV SOLN
Freq: Once | INTRAVENOUS | Status: AC
  Filled 2019-08-24: qty 250

## 2019-08-24 MED ORDER — ZOLEDRONIC ACID 4 MG/5ML IV CONC
3.3000 mg | Freq: Once | INTRAVENOUS | Status: AC
  Administered 2019-08-24: 3.3 mg via INTRAVENOUS
  Filled 2019-08-24: qty 4.13

## 2019-08-24 MED ORDER — SODIUM CHLORIDE 0.9 % IV SOLN
1.4000 mg/m2 | Freq: Once | INTRAVENOUS | Status: AC
  Administered 2019-08-24: 11:00:00 2.4 mg via INTRAVENOUS
  Filled 2019-08-24: qty 4.8

## 2019-08-24 NOTE — Patient Instructions (Signed)
Eribulin solution for injection What is this medicine? ERIBULIN (er e bu lin) is a chemotherapy drug. It is used to treat breast cancer and liposarcoma. This medicine may be used for other purposes; ask your health care provider or pharmacist if you have questions. COMMON BRAND NAME(S): Halaven What should I tell my health care provider before I take this medicine? They need to know if you have any of these conditions:  heart disease  history of irregular heartbeat  kidney disease  liver disease  low blood counts, like low white cell, platelet, or red cell counts  low levels of potassium or magnesium in the blood  an unusual or allergic reaction to eribulin, other medicines, foods, dyes, or preservatives  pregnant or trying to get pregnant  breast-feeding How should I use this medicine? This medicine is for infusion into a vein. It is given by a health care professional in a hospital or clinic setting. Talk to your pediatrician regarding the use of this medicine in children. Special care may be needed. Overdosage: If you think you have taken too much of this medicine contact a poison control center or emergency room at once. NOTE: This medicine is only for you. Do not share this medicine with others. What if I miss a dose? It is important not to miss your dose. Call your doctor or health care professional if you are unable to keep an appointment. What may interact with this medicine? Do not take this medicine with any of the following medications:  amiodarone  astemizole  arsenic trioxide  bepridil  bretylium  chloroquine  chlorpromazine  cisapride  clarithromycin  dextromethorphan,  quinidine  disopyramide  droperidol  dronedarone  erythromycin  grepafloxacin  halofantrine  haloperidol  ibutilide  levomethadyl  mesoridazine  methadone  pentamidine  procainamide  quinidine  pimozide  posaconazole  probucol  propafenone  saquinavir  sotalol  sparfloxacin  terfenadine  thioridazine  troleandomycin This medicine may also interact with the following medications:  dofetilide  ziprasidone This list may not describe all possible interactions. Give your health care provider a list of all the medicines, herbs, non-prescription drugs, or dietary supplements you use. Also tell them if you smoke, drink alcohol, or use illegal drugs. Some items may interact with your medicine. What should I watch for while using this medicine? This drug may make you feel generally unwell. This is not uncommon, as chemotherapy can affect healthy cells as well as cancer cells. Report any side effects. Continue your course of treatment even though you feel ill unless your doctor tells you to stop. Call your doctor or health care professional for advice if you get a fever, chills or sore throat, or other symptoms of a cold or flu. Do not treat yourself. This drug decreases your body's ability to fight infections. Try to avoid being around people who are sick. This medicine may increase your risk to bruise or bleed. Call your doctor or health care professional if you notice any unusual bleeding. You may need blood work done while you are taking this medicine. Do not become pregnant while taking this medicine or for 2 weeks after stopping it. Women should inform their doctor if they wish to become pregnant or think they might be pregnant. Men should not father a child while taking this medicine and for 3.5 months after stopping it. There is a potential for serious side effects to an unborn child. Talk to your health care professional or pharmacist for more information. Do    not breast-feed an infant while taking this medicine or for 2 weeks after stopping it. What side effects may I notice from receiving this medicine? Side effects that you should report to your doctor or health care professional as soon as possible:  allergic reactions like skin rash, itching or hives, swelling of the face, lips, or tongue  low blood counts - this medicine may decrease the number of white blood cells, red blood cells and platelets. You may be at increased risk for infections and bleeding.  signs of infection - fever or chills, cough, sore throat, pain or difficulty passing urine  signs of decreased platelets or bleeding - bruising, pinpoint red spots on the skin, black, tarry stools, blood in the urine  signs of decreased red blood cells - unusually weak or tired, fainting spells, lightheadedness  pain, tingling, numbness in the hands or feet Side effects that usually do not require medical attention (report to your doctor or health care professional if they continue or are bothersome):  constipation  hair loss  headache  loss of appetite  muscle or joint pain  nausea, vomiting  stomach pain This list may not describe all possible side effects. Call your doctor for medical advice about side effects. You may report side effects to FDA at 1-800-FDA-1088. Where should I keep my medicine? This drug is given in a hospital or clinic and will not be stored at home. NOTE: This sheet is a summary. It may not cover all possible information. If you have questions about this medicine, talk to your doctor, pharmacist, or health care provider.  2020 [employer]vier/Gold Standard (2018-07-27 11:00:16)  

## 2019-08-24 NOTE — Progress Notes (Signed)
Pt in for follow up, denies any concerns or changes since speaking with nurse for pre assessment yesterday. States stopped Ibrance as instructed.

## 2019-08-25 ENCOUNTER — Other Ambulatory Visit: Payer: Self-pay

## 2019-08-25 ENCOUNTER — Inpatient Hospital Stay: Payer: Medicare Other

## 2019-08-25 LAB — CANCER ANTIGEN 27.29: CA 27.29: 74.9 U/mL — ABNORMAL HIGH (ref 0.0–38.6)

## 2019-08-25 NOTE — Progress Notes (Signed)
Patient had been on oral chemo but had first IV chemo yesterday and was noted she had not been to patient education class.  Patient came today to class and I did a follow up after receiving first infusion yesterday.  Patient states she is feeling great and infusion went well.   Patient received all education today.  Encoruaged patient to call for any questions or concerns.

## 2019-08-26 ENCOUNTER — Other Ambulatory Visit: Payer: Medicare Other

## 2019-08-28 NOTE — Progress Notes (Signed)
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  INTERVAL HISTORY: Patient returns to clinic today for further evaluation and consideration of cycle 1, day 8 of Halaven.  She tolerated her first infusion well without significant side effects.  She continues to have chronic low back pain, but otherwise feels well.  She has no neurologic complaints.  She denies any recent fevers or illnesses.  She denies any chest pain, shortness of breath, cough, or hemoptysis.  She has a fair appetite and denies weight loss.  She denies any nausea, vomiting, constipation, or diarrhea.  She has no melena or hematochezia.  She has no urinary complaints.  Patient offers no further specific complaints today.  REVIEW OF SYSTEMS:   Review of Systems  Constitutional: Negative.  Negative for fever, malaise/fatigue and weight loss.  Respiratory: Negative.  Negative for cough and shortness of breath.   Cardiovascular: Negative.  Negative for chest pain and leg swelling.  Gastrointestinal: Negative.  Negative for abdominal pain, blood in stool, diarrhea and melena.  Genitourinary: Negative.   Musculoskeletal: Positive for back pain. Negative for joint pain.  Skin: Negative.  Negative for rash.  Neurological: Negative.  Negative for dizziness, sensory change, focal weakness, weakness and headaches.  Psychiatric/Behavioral: Negative.  The patient is not nervous/anxious.     As per HPI. Otherwise, a complete review of systems is negative.  PAST MEDICAL HISTORY: Past Medical History:  Diagnosis Date  . Anxiety   .  Arthritis    "back" (04/03/2016)  . Breast cancer ([place]) 2012    no lumpectomy. Treating with meds- Right  . Cancer ([place])    small intestines 2012, gallbladder 2016  . Chronic lower back pain   . COPD (chronic obstructive pulmonary disease) ([place])    "use inhalers for COPD that dx'd at one time" (04/03/2016)  . Family history of adverse reaction to anesthesia    daughter gets nauseated  . GERD (gastroesophageal reflux disease)   . High cholesterol   . Hypertension   . Hypothyroidism   . Skin cancer    "right neck; mid forehead"    PAST SURGICAL HISTORY: Past Surgical History:  Procedure Laterality Date  . ABDOMINAL HERNIA REPAIR     "after they took tumor out of my colon I developed a hernia"  . ABDOMINAL HYSTERECTOMY    . BACK SURGERY    . BREAST BIOPSY Right 2012   positive  . BREAST BIOPSY Right 2014   positive  . BREAST BIOPSY Right 06/26/2016   US guided - waiting for pathology  . BREAST EXCISIONAL BIOPSY Right 1995   benign  . CARPAL TUNNEL RELEASE Left   . DILATION AND CURETTAGE OF UTERUS     S/P miscarriage  . IR GENERIC HISTORICAL  04/04/2016   IR LUMBAR DISC ASPIRATION W/IMG GUIDE 04/04/2016 [name], MD [facility]  . JOINT REPLACEMENT    . LAPAROSCOPIC CHOLECYSTECTOMY  2016  . REPLACEMENT TOTAL HIP W/  RESURFACING IMPLANTS Bilateral   . SKIN CANCER EXCISION     "right neck; mid forehead"  . TOTAL HIP ARTHROPLASTY Right 10/03/2015   Procedure: TOTAL HIP ARTHROPLASTY ANTERIOR APPROACH;  Surgeon:  Hessie Knows, MD;  Location: [facility] ORS;  Service: Orthopedics;  Laterality: Right;  . TOTAL HIP ARTHROPLASTY Left 07/25/2016   Procedure: TOTAL HIP ARTHROPLASTY ANTERIOR APPROACH;  Surgeon: Hessie Knows, MD;  Location: [facility] ORS;  Service: Orthopedics;  Laterality: Left;  . TUBAL LIGATION    . TUMOR EXCISION  2012   "small intestine; came from the breast cancer"    FAMILY HISTORY: Reviewed and unchanged. No reported history of breast cancer or other chronic  diseases.     ADVANCED DIRECTIVES:    HEALTH MAINTENANCE: Social History   Tobacco Use  . Smoking status: Current Every Day Smoker    Packs/day: 1.00    Years: 54.00    Pack years: 54.00    Types: Cigarettes  . Smokeless tobacco: Never Used  . Tobacco comment: not ready to quit  Substance Use Topics  . Alcohol use: No  . Drug use: No     Colonoscopy:  PAP:  Bone density:  Lipid panel:  Allergies  Allergen Reactions  . Carafate [Sucralfate] Nausea Only  . Diclofenac Sodium Nausea Only  . Erythromycin Diarrhea and Nausea And Vomiting  . Adhesive [Tape] Itching and Rash  . Wellbutrin [Bupropion] Itching and Rash    Current Outpatient Medications  Medication Sig Dispense Refill  . acetaminophen (TYLENOL) 500 MG tablet Take 1,000 mg by mouth daily as needed for mild pain or fever.     Marland Kitchen albuterol (VENTOLIN HFA) 108 (90 Base) MCG/ACT inhaler Inhale 2 puffs into the lungs every 4 (four) hours as needed.    . cetirizine (ZYRTEC) 10 MG tablet Take 10 mg by mouth daily as needed for allergies. Reported on 10/03/2015    . citalopram (CELEXA) 20 MG tablet Take 20 mg by mouth every morning.     . dicyclomine (BENTYL) 10 MG capsule Take 10 mg by mouth 3 (three) times daily as needed for spasms.    Marland Kitchen levothyroxine (SYNTHROID, LEVOTHROID) 100 MCG tablet Take 100 mcg by mouth daily before breakfast.     . montelukast (SINGULAIR) 10 MG tablet Take 10 mg by mouth at bedtime.     . Multiple Vitamins-Minerals (CENTRUM SILVER PO) Take 1 tablet by mouth every morning.    Marland Kitchen omeprazole (PRILOSEC) 20 MG capsule Take 20 mg by mouth every morning.     . ondansetron (ZOFRAN) 8 MG tablet Take 1 tablet (8 mg total) by mouth 2 (two) times daily as needed (Nausea or vomiting). 30 tablet 1  . pravastatin (PRAVACHOL) 40 MG tablet Take 40 mg by mouth at bedtime.     . prochlorperazine (COMPAZINE) 10 MG tablet Take 1 tablet (10 mg total) by mouth every 6 (six) hours as needed (Nausea or vomiting). 30  tablet 1  . triamcinolone cream (KENALOG) 0.1 % APPLY TO AFFECTED AREA TWICE A DAY UNTIL CLEAR    . TYLENOL 325 MG tablet     . valsartan-hydrochlorothiazide (DIOVAN-HCT) 80-12.5 MG tablet Take 1 tablet by mouth daily.    Grant Ruts INHUB 100-50 MCG/DOSE AEPB INHALE 1 INHALATION INTO THE LUNGS EVERY 12 (TWELVE) HOURS    . zolpidem (AMBIEN) 10 MG tablet TAKE 1 TABLET BY MOUTH AT BEDTIME AS NEEDED FOR SLEEP 30 tablet 2  . IBRANCE 100 MG tablet TAKE 1 TABLET (100 MG TOTAL) BY MOUTH DAILY. TAKE FOR 21 DAYS ON, 7 DAYS OFF, REPEAT EVERY 28 DAYS. (Patient not taking: Reported on 08/23/2019) 21 tablet 4  . letrozole (FEMARA) 2.5 MG tablet Take 1 tablet (2.5 mg total)  by mouth daily. (Patient not taking: Reported on 08/30/2019) 90 tablet 4   No current facility-administered medications for this visit.    OBJECTIVE: Vitals:   08/31/19 1200  BP: (!) 122/55  Pulse: 62  Resp: 18  SpO2: 100%     Body mass index is 23.86 kg/m.    ECOG FS:0 - Asymptomatic  General: Well-developed, well-nourished, no acute distress. Eyes: Pink conjunctiva, anicteric sclera. HEENT: Normocephalic, moist mucous membranes. Lungs: No audible wheezing or coughing. Heart: Regular rate and rhythm. Abdomen: Soft, nontender, no obvious distention. Musculoskeletal: No edema, cyanosis, or clubbing. Neuro: Alert, answering all questions appropriately. Cranial nerves grossly intact. Skin: No rashes or petechiae noted. Psych: Normal affect.    LAB RESULTS: Lab Results  Component Value Date   NA 137 08/31/2019   K 4.4 08/31/2019   CL 101 08/31/2019   CO2 25 08/31/2019   GLUCOSE 103 (H) 08/31/2019   BUN 26 (H) 08/31/2019   CREATININE 1.11 (H) 08/31/2019   CALCIUM 8.8 (L) 08/31/2019   PROT 7.2 08/31/2019   ALBUMIN 3.8 08/31/2019   AST 128 (H) 08/31/2019   ALT 138 (H) 08/31/2019   ALKPHOS 160 (H) 08/31/2019   BILITOT 0.4 08/31/2019   GFRNONAA 49 (L) 08/31/2019   GFRAA 57 (L) 08/31/2019    Lab Results  Component Value  Date   WBC 5.1 08/31/2019   NEUTROABS 3.7 08/31/2019   HGB 10.3 (L) 08/31/2019   HCT 32.1 (L) 08/31/2019   MCV 106.3 (H) 08/31/2019   PLT 234 08/31/2019    STUDIES: No results found.  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  PLAN:    1.  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  PET scan results from July 19, 2019 reviewed independently with new metastatic lesions in bone.  Patient has no evidence of visceral disease.  She also has a slowly increasing CA 27-29.  After lengthy discussion, patient agreed to discontinue Ibrance and letrozole.  Patient will receive Halaven on days 1 and 8 with a 15 off.  Plan to repeat PET scan after approximately 4 cycles.  Patient will also receive Zometa.  Her last dose was on August 24, 2019.  Proceed with cycle 1, day 8 of Halaven today. Return to clinic in 2 weeks for further evaluation and consideration of cycle 2, day 1.  2.  Hip/back pain: Chronic and unchanged.  Patient is status post bilateral hip replacement. 3.  Anemia: Chronic and unchanged.  Hemoglobin is 10.3 today. 4.  Renal insufficiency: Patient's most recent creatinine has improved to 1.11.  Proceed with treatment as above.  Appreciate nephrology input. 5.  Leukopenia: Resolved. 6.  Thrombocytopenia: Resolved. 7.  Bony lesions: Will include monthly Zometa with her treatments. 8.  Elevated liver enzymes: Monitor.  Proceed cautiously with Halaven as above. 9.  Venous access: Will send referral to surgery for port placement.   Patient expressed understanding and was in agreement with this plan. She also understands that She can call clinic at any time with any questions, concerns, or complaints.    Lloyd Huger, MD   08/31/2019  3:40 PM

## 2019-08-29 DIAGNOSIS — N189 Chronic kidney disease, unspecified: Secondary | ICD-10-CM | POA: Insufficient documentation

## 2019-08-29 DIAGNOSIS — N183 Chronic kidney disease, stage 3 unspecified: Secondary | ICD-10-CM | POA: Insufficient documentation

## 2019-08-29 DIAGNOSIS — R809 Proteinuria, unspecified: Secondary | ICD-10-CM | POA: Insufficient documentation

## 2019-08-29 DIAGNOSIS — D631 Anemia in chronic kidney disease: Secondary | ICD-10-CM | POA: Insufficient documentation

## 2019-08-30 ENCOUNTER — Other Ambulatory Visit: Payer: Self-pay

## 2019-08-30 NOTE — Progress Notes (Signed)
Patient pre screened for office appointment, no questions or concerns today. Patient reminded of upcoming appointment time and date. 

## 2019-08-31 ENCOUNTER — Inpatient Hospital Stay: Payer: Medicare Other

## 2019-08-31 ENCOUNTER — Other Ambulatory Visit: Payer: Self-pay

## 2019-08-31 ENCOUNTER — Inpatient Hospital Stay (HOSPITAL_BASED_OUTPATIENT_CLINIC_OR_DEPARTMENT_OTHER): Payer: Medicare Other | Admitting: Oncology

## 2019-08-31 VITALS — BP 122/55 | HR 62 | Resp 18 | Wt 139.0 lb

## 2019-08-31 DIAGNOSIS — C50911 Malignant neoplasm of unspecified site of right female breast: Secondary | ICD-10-CM

## 2019-08-31 DIAGNOSIS — Z5111 Encounter for antineoplastic chemotherapy: Secondary | ICD-10-CM | POA: Diagnosis not present

## 2019-08-31 LAB — CBC WITH DIFFERENTIAL/PLATELET
Abs Immature Granulocytes: 0.08 K/uL — ABNORMAL HIGH (ref 0.00–0.07)
Basophils Absolute: 0.1 K/uL (ref 0.0–0.1)
Basophils Relative: 1 %
Eosinophils Absolute: 0.1 K/uL (ref 0.0–0.5)
Eosinophils Relative: 1 %
HCT: 32.1 % — ABNORMAL LOW (ref 36.0–46.0)
Hemoglobin: 10.3 g/dL — ABNORMAL LOW (ref 12.0–15.0)
Immature Granulocytes: 2 %
Lymphocytes Relative: 17 %
Lymphs Abs: 0.9 K/uL (ref 0.7–4.0)
MCH: 34.1 pg — ABNORMAL HIGH (ref 26.0–34.0)
MCHC: 32.1 g/dL (ref 30.0–36.0)
MCV: 106.3 fL — ABNORMAL HIGH (ref 80.0–100.0)
Monocytes Absolute: 0.3 K/uL (ref 0.1–1.0)
Monocytes Relative: 6 %
Neutro Abs: 3.7 K/uL (ref 1.7–7.7)
Neutrophils Relative %: 73 %
Platelets: 234 K/uL (ref 150–400)
RBC: 3.02 MIL/uL — ABNORMAL LOW (ref 3.87–5.11)
RDW: 13.7 % (ref 11.5–15.5)
WBC: 5.1 K/uL (ref 4.0–10.5)
nRBC: 0 % (ref 0.0–0.2)

## 2019-08-31 LAB — COMPREHENSIVE METABOLIC PANEL
ALT: 138 U/L — ABNORMAL HIGH (ref 0–44)
AST: 128 U/L — ABNORMAL HIGH (ref 15–41)
Albumin: 3.8 g/dL (ref 3.5–5.0)
Alkaline Phosphatase: 160 U/L — ABNORMAL HIGH (ref 38–126)
Anion gap: 11 (ref 5–15)
BUN: 26 mg/dL — ABNORMAL HIGH (ref 8–23)
CO2: 25 mmol/L (ref 22–32)
Calcium: 8.8 mg/dL — ABNORMAL LOW (ref 8.9–10.3)
Chloride: 101 mmol/L (ref 98–111)
Creatinine, Ser: 1.11 mg/dL — ABNORMAL HIGH (ref 0.44–1.00)
GFR calc Af Amer: 57 mL/min — ABNORMAL LOW (ref >60)
GFR calc non Af Amer: 49 mL/min — ABNORMAL LOW (ref >60)
Glucose, Bld: 103 mg/dL — ABNORMAL HIGH (ref 70–99)
Potassium: 4.4 mmol/L (ref 3.5–5.1)
Sodium: 137 mmol/L (ref 135–145)
Total Bilirubin: 0.4 mg/dL (ref 0.3–1.2)
Total Protein: 7.2 g/dL (ref 6.5–8.1)

## 2019-08-31 MED ORDER — PROCHLORPERAZINE MALEATE 10 MG PO TABS
10.0000 mg | ORAL_TABLET | Freq: Once | ORAL | Status: AC
  Administered 2019-08-31: 10 mg via ORAL
  Filled 2019-08-31: qty 1

## 2019-08-31 MED ORDER — SODIUM CHLORIDE 0.9 % IV SOLN
Freq: Once | INTRAVENOUS | Status: AC
  Filled 2019-08-31: qty 250

## 2019-08-31 MED ORDER — SODIUM CHLORIDE 0.9 % IV SOLN
1.4000 mg/m2 | Freq: Once | INTRAVENOUS | Status: AC
  Administered 2019-08-31: 13:00:00 2.4 mg via INTRAVENOUS
  Filled 2019-08-31: qty 4.8

## 2019-08-31 NOTE — Addendum Note (Signed)
Addended by: Mila Merry on: 08/31/2019 03:57 PM   Modules accepted: Orders

## 2019-09-02 ENCOUNTER — Other Ambulatory Visit: Payer: Self-pay | Admitting: General Surgery

## 2019-09-08 ENCOUNTER — Other Ambulatory Visit: Payer: Self-pay

## 2019-09-08 ENCOUNTER — Encounter
Admission: RE | Admit: 2019-09-08 | Discharge: 2019-09-08 | Disposition: A | Discharge status: Home / Self Care | Payer: Medicare Other | Source: Ambulatory Visit | Attending: General Surgery | Admitting: General Surgery

## 2019-09-08 HISTORY — DX: Chronic kidney disease, unspecified: N18.9

## 2019-09-08 HISTORY — DX: Dyspnea, unspecified: R06.00

## 2019-09-08 NOTE — Patient Instructions (Signed)
Your procedure is scheduled on: Monday 09/13/19.  Report to DAY SURGERY DEPARTMENT LOCATED ON 2ND FLOOR MEDICAL MALL ENTRANCE. To find out your arrival time please call [phone] between 1PM - 3PM on Friday 09/10/19.   Remember: Instructions that are not followed completely may result in serious medical risk, up to and including death, or upon the discretion of your surgeon and anesthesiologist your surgery may need to be rescheduled.      _X__ 1. Do not eat food after midnight the night before your procedure.                 No gum chewing or hard candies. You may drink clear liquids up to 2 hours                 before you are scheduled to arrive for your surgery- DO NOT drink clear                 liquids within 2 hours of the start of your surgery.                 Clear Liquids include:  water, apple juice without pulp, clear carbohydrate                 drink such as Clearfast or Gatorade, Black Coffee or Tea (Do not add                 anything to coffee or tea).   __X__2.  On the morning of surgery brush your teeth with toothpaste and water, you may rinse your mouth with mouthwash if you wish.  Do not swallow any toothpaste or mouthwash.       _X__ 3.  No Alcohol for 24 hours before or after surgery.     _X__ 4.  Do Not Smoke or use e-cigarettes For 24 Hours Prior to Your Surgery.                 Do not use any chewable tobacco products for at least 6 hours prior to                 surgery.    __X__6.  Notify your doctor if there is any change in your medical condition      (cold, fever, infections).       Do not wear jewelry, make-up, hairpins, clips or nail polish. Do not wear lotions, powders, or perfumes.  Do not shave 48 hours prior to surgery. Men may shave face and neck. Do not bring valuables to the hospital.     [facility] is not responsible for any belongings or valuables.    Contacts, dentures/partials or body piercings may not be worn into  surgery. Bring a case for your contacts, glasses or hearing aids, a denture cup will be supplied.    Patients discharged the day of surgery will not be allowed to drive home.    __X__ Take these medicines the morning of surgery with A SIP OF WATER:     1. citalopram (CELEXA) 20 MG tablet  2. levothyroxine (SYNTHROID, LEVOTHROID) 100 MCG tablet  3. omeprazole (PRILOSEC) 20 MG capsule AND take an additional dose on Sunday night.  4. WIXELA INHUB 100-50 MCG/DOSE AEPB     __X__ Use CHG Soap as directed     __X__ Stop Anti-inflammatories 7 days before surgery such as Advil, Ibuprofen, Motrin, BC or Goodies Powder, Naprosyn, Naproxen, Aleve, Aspirin, Meloxicam. May take Tylenol if needed  for pain or discomfort.     __X__ Don't start taking any new herbal supplements before your procedure.

## 2019-09-09 ENCOUNTER — Other Ambulatory Visit: Admission: RE | Admit: 2019-09-09 | Payer: Medicare Other | Source: Ambulatory Visit

## 2019-09-09 ENCOUNTER — Ambulatory Visit
Admission: RE | Admit: 2019-09-09 | Discharge: 2019-09-09 | Disposition: A | Discharge status: Home / Self Care | Payer: Medicare Other | Source: Ambulatory Visit | Attending: General Surgery | Admitting: General Surgery

## 2019-09-09 DIAGNOSIS — Z01818 Encounter for other preprocedural examination: Secondary | ICD-10-CM | POA: Diagnosis present

## 2019-09-09 DIAGNOSIS — Z20822 Contact with and (suspected) exposure to covid-19: Secondary | ICD-10-CM | POA: Diagnosis not present

## 2019-09-09 DIAGNOSIS — C50912 Malignant neoplasm of unspecified site of left female breast: Secondary | ICD-10-CM | POA: Insufficient documentation

## 2019-09-09 DIAGNOSIS — Z01810 Encounter for preprocedural cardiovascular examination: Secondary | ICD-10-CM | POA: Diagnosis not present

## 2019-09-09 LAB — SARS CORONAVIRUS 2 (TAT 6-24 HRS): SARS Coronavirus 2: NEGATIVE

## 2019-09-11 NOTE — Progress Notes (Signed)
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  INTERVAL HISTORY: Patient returns to clinic today for further evaluation and consideration of cycle 2, day 1 of Halaven.  She had a port placed this past week and tolerated her procedure well. She continues to have chronic low back pain, but otherwise feels well.  She is tolerating her treatments without significant side effects.  She has no neurologic complaints.  She denies any recent fevers or illnesses.  She denies any chest pain, shortness of breath, cough, or hemoptysis.  She has a good appetite and has gained some weight in the interim.  She denies any nausea, vomiting, constipation, or diarrhea.  She has no melena or hematochezia.  She has no urinary complaints.  Patient offers no further specific complaints today.  REVIEW OF SYSTEMS:   Review of Systems  Constitutional: Negative.  Negative for fever, malaise/fatigue and weight loss.  Respiratory: Negative.  Negative for cough and shortness of breath.   Cardiovascular: Negative.  Negative for chest pain and leg swelling.  Gastrointestinal: Negative.  Negative for abdominal pain, blood in stool, diarrhea and melena.  Genitourinary: Negative.   Musculoskeletal: Positive for back pain. Negative for joint pain.  Skin: Negative.  Negative for rash.  Neurological: Negative.  Negative for dizziness, sensory change, focal weakness, weakness and headaches.  Psychiatric/Behavioral: Negative.  The patient is not nervous/anxious.     As per HPI. Otherwise, a complete review of systems is  negative.  PAST MEDICAL HISTORY: Past Medical History:  Diagnosis Date  . Anxiety   . Arthritis    "back" (04/03/2016)  . Breast cancer ([place]) 2012    no lumpectomy. Treating with meds- Right  . Cancer ([place])    small intestines 2012, gallbladder 2016  . Chronic kidney disease   . Chronic lower back pain   . COPD (chronic obstructive pulmonary disease) ([place])    "use inhalers for COPD that dx'd at one time" (04/03/2016)  . Dyspnea   . Family history of adverse reaction to anesthesia    daughter gets nauseated  . GERD (gastroesophageal reflux disease)   . High cholesterol   . Hypertension   . Hypothyroidism   . Skin cancer    "right neck; mid forehead"    PAST SURGICAL HISTORY: Past Surgical History:  Procedure Laterality Date  . ABDOMINAL HERNIA REPAIR     "after they took tumor out of my colon I developed a hernia"  . ABDOMINAL HYSTERECTOMY    . BACK SURGERY    . BREAST BIOPSY Right 2012   positive  . BREAST BIOPSY Right 2014   positive  . BREAST BIOPSY Right 06/26/2016   US guided - waiting for pathology  . BREAST EXCISIONAL BIOPSY Right 1995   benign  . CARPAL TUNNEL RELEASE Left   . DILATION AND CURETTAGE OF UTERUS     S/P miscarriage  . HERNIA REPAIR    . IR GENERIC HISTORICAL  04/04/2016   IR LUMBAR DISC ASPIRATION W/IMG GUIDE 04/04/2016 Sara Bras, MD [facility]  . JOINT REPLACEMENT    . LAPAROSCOPIC CHOLECYSTECTOMY  2016  . PORTACATH PLACEMENT Left 09/13/2019   Procedure:  INSERTION PORT-A-CATH;  Surgeon: Sara Bellow, MD;  Location: [facility] ORS;  Service: General;  Laterality: Left;  . REPLACEMENT TOTAL HIP W/  RESURFACING IMPLANTS Bilateral   . SKIN CANCER EXCISION     "right neck; mid forehead"  . TOTAL HIP ARTHROPLASTY Right 10/03/2015   Procedure: TOTAL HIP ARTHROPLASTY ANTERIOR APPROACH;  Surgeon: Sara Knows, MD;  Location: [facility] ORS;  Service: Orthopedics;  Laterality: Right;  . TOTAL HIP ARTHROPLASTY Left 07/25/2016   Procedure: TOTAL HIP  ARTHROPLASTY ANTERIOR APPROACH;  Surgeon: Sara Knows, MD;  Location: [facility] ORS;  Service: Orthopedics;  Laterality: Left;  . TUBAL LIGATION    . TUMOR EXCISION  2012   "small intestine; came from the breast cancer"    FAMILY HISTORY: Reviewed and unchanged. No reported history of breast cancer or other chronic diseases.     ADVANCED DIRECTIVES:    HEALTH MAINTENANCE: Social History   Tobacco Use  . Smoking status: Current Every Day Smoker    Packs/day: 1.00    Years: 54.00    Pack years: 54.00    Types: Cigarettes  . Smokeless tobacco: Never Used  . Tobacco comment: not ready to quit  Substance Use Topics  . Alcohol use: No  . Drug use: No     Colonoscopy:  PAP:  Bone density:  Lipid panel:  Allergies  Allergen Reactions  . Carafate [Sucralfate] Nausea Only  . Diclofenac Sodium Nausea Only  . Erythromycin Diarrhea and Nausea And Vomiting  . Adhesive [Tape] Itching and Rash  . Wellbutrin [Bupropion] Itching and Rash    Current Outpatient Medications  Medication Sig Dispense Refill  . acetaminophen (TYLENOL) 500 MG tablet Take 1,000 mg by mouth daily as needed for moderate pain or headache.     . citalopram (CELEXA) 20 MG tablet Take 20 mg by mouth every morning.     Sara David 100 MG tablet TAKE 1 TABLET (100 MG TOTAL) BY MOUTH DAILY. TAKE FOR 21 DAYS ON, 7 DAYS OFF, REPEAT EVERY 28 DAYS. 21 tablet 4  . letrozole (FEMARA) 2.5 MG tablet Take 1 tablet (2.5 mg total) by mouth daily. 90 tablet 4  . levothyroxine (SYNTHROID, LEVOTHROID) 100 MCG tablet Take 100 mcg by mouth daily before breakfast.     . montelukast (SINGULAIR) 10 MG tablet Take 10 mg by mouth at bedtime.     . Multiple Vitamins-Minerals (CENTRUM SILVER PO) Take 1 tablet by mouth every morning.    Marland Kitchen omeprazole (PRILOSEC) 20 MG capsule Take 20 mg by mouth every morning.     . ondansetron (ZOFRAN) 8 MG tablet Take 1 tablet (8 mg total) by mouth 2 (two) times daily as needed (Nausea or vomiting). 30 tablet 1    . pravastatin (PRAVACHOL) 40 MG tablet Take 40 mg by mouth at bedtime.     . prochlorperazine (COMPAZINE) 10 MG tablet Take 1 tablet (10 mg total) by mouth every 6 (six) hours as needed (Nausea or vomiting). 30 tablet 1  . valsartan-hydrochlorothiazide (DIOVAN-HCT) 80-12.5 MG tablet Take 1 tablet by mouth daily.    Grant Ruts INHUB 100-50 MCG/DOSE AEPB Inhale 1 puff into the lungs daily.     Marland Kitchen zolpidem (AMBIEN) 10 MG tablet TAKE 1 TABLET BY MOUTH AT BEDTIME AS NEEDED FOR SLEEP (Patient taking differently: Take 10 mg by mouth at bedtime. ) 30 tablet 2   No current facility-administered medications for this visit.    OBJECTIVE: Vitals:   09/14/19 0947 09/14/19 0953  BP: (!) 102/39 (!) 102/53  Pulse: 67   Resp: 18   Temp: (!) 97.2 F (36.2 C)      Body mass index is 25.92 kg/m.    ECOG FS:0 - Asymptomatic  General: Well-developed, well-nourished, no acute distress. Eyes: Pink conjunctiva, anicteric sclera. HEENT: Normocephalic, moist mucous membranes. Lungs: No audible wheezing or coughing. Heart: Regular rate and rhythm. Abdomen: Soft, nontender, no obvious distention. Musculoskeletal: No edema, cyanosis, or clubbing. Neuro: Alert, answering all questions appropriately. Cranial nerves grossly intact. Skin: No rashes or petechiae noted. Psych: Normal affect.   LAB RESULTS: Lab Results  Component Value Date   NA 139 09/14/2019   K 4.2 09/14/2019   CL 106 09/14/2019   CO2 25 09/14/2019   GLUCOSE 89 09/14/2019   BUN 31 (H) 09/14/2019   CREATININE 1.11 (H) 09/14/2019   CALCIUM 8.8 (L) 09/14/2019   PROT 6.7 09/14/2019   ALBUMIN 3.7 09/14/2019   AST 40 09/14/2019   ALT 44 09/14/2019   ALKPHOS 152 (H) 09/14/2019   BILITOT 0.6 09/14/2019   GFRNONAA 49 (L) 09/14/2019   GFRAA 57 (L) 09/14/2019    Lab Results  Component Value Date   WBC 4.3 09/14/2019   NEUTROABS 3.0 09/14/2019   HGB 9.0 (L) 09/14/2019   HCT 29.0 (L) 09/14/2019   MCV 106.2 (H) 09/14/2019   PLT 177  09/14/2019    STUDIES: DG Chest Port 1 View  Result Date: 09/13/2019 CLINICAL DATA:  Port-A-Cath placement EXAM: PORTABLE CHEST 1 VIEW COMPARISON:  CT 01/15/2019 FINDINGS: Interval placement of a left subclavian approach Port-A-Cath with distal tip terminating at the level of the superior cavoatrial junction. Heart size is normal. No focal airspace consolidation, pleural effusion, or pneumothorax. Mildly heterogeneous appearance of the visualized osseous structures, including sclerotic focus in the right humeral head, compatible with known osseous metastatic disease. IMPRESSION: Interval placement of a left-sided Port-A-Cath.  No pneumothorax. Electronically Signed   By: Davina Poke D.O.   On: 09/13/2019 08:44   DG C-Arm 1-60 Min-No Report  Result Date: 09/13/2019 Fluoroscopy was utilized by the requesting physician.  No radiographic interpretation.    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  PLAN:    1.  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  PET scan results from July 19, 2019 reviewed independently with new metastatic lesions in bone.  Patient has no evidence of visceral disease.  She also has a slowly increasing CA 27-29.  After lengthy discussion, patient agreed to discontinue Ibrance and letrozole.  Patient will receive Halaven on days 1 and 8 with a 15 off.  Plan to repeat PET scan after approximately 4 cycles.  Patient last received Zometa on August 24, 2019.  Proceed with cycle 2, day 1 of Halaven today.  Return to clinic in 1 week for further evaluation and consideration of cycle 2, day 8. 2.  Hip/back pain: Chronic and unchanged.  Patient is status post bilateral hip replacement. 3.  Anemia: Chronic and unchanged.   Hemoglobin has trended down slightly to 9.0.  4.  Renal insufficiency: Chronic and unchanged.  Patient's most recent creatinine is unchanged at 1.11.  Proceed with treatment as above.  Appreciate nephrology input. 5.  Leukopenia: Resolved. 6.  Thrombocytopenia: Resolved. 7.  Bony lesions: Will include monthly Zometa with her treatments.  8.  Elevated liver enzymes: Resolved. 9.  Venous access: Patient now has had port placement.  Patient expressed understanding and was in agreement with this plan. She also understands that She can call clinic at any time with any questions, concerns, or complaints.    Lloyd Huger, MD   09/14/2019 10:08 AM

## 2019-09-13 ENCOUNTER — Ambulatory Visit
Admission: RE | Admit: 2019-09-13 | Discharge: 2019-09-13 | Disposition: A | Discharge status: Home / Self Care | Payer: Medicare Other | Attending: General Surgery | Admitting: General Surgery

## 2019-09-13 ENCOUNTER — Other Ambulatory Visit: Payer: Self-pay

## 2019-09-13 ENCOUNTER — Encounter
Admission: RE | Disposition: A | Discharge status: Home / Self Care | Payer: Self-pay | Source: Home / Self Care | Attending: General Surgery

## 2019-09-13 ENCOUNTER — Ambulatory Visit: Payer: Medicare Other | Admitting: Anesthesiology

## 2019-09-13 ENCOUNTER — Ambulatory Visit: Payer: Medicare Other

## 2019-09-13 DIAGNOSIS — Z881 Allergy status to other antibiotic agents status: Secondary | ICD-10-CM | POA: Insufficient documentation

## 2019-09-13 DIAGNOSIS — Z79899 Other long term (current) drug therapy: Secondary | ICD-10-CM | POA: Diagnosis not present

## 2019-09-13 DIAGNOSIS — C50919 Malignant neoplasm of unspecified site of unspecified female breast: Secondary | ICD-10-CM | POA: Diagnosis present

## 2019-09-13 DIAGNOSIS — I129 Hypertensive chronic kidney disease with stage 1 through stage 4 chronic kidney disease, or unspecified chronic kidney disease: Secondary | ICD-10-CM | POA: Insufficient documentation

## 2019-09-13 DIAGNOSIS — N189 Chronic kidney disease, unspecified: Secondary | ICD-10-CM | POA: Diagnosis not present

## 2019-09-13 DIAGNOSIS — Z79890 Hormone replacement therapy (postmenopausal): Secondary | ICD-10-CM | POA: Diagnosis not present

## 2019-09-13 DIAGNOSIS — E7800 Pure hypercholesterolemia, unspecified: Secondary | ICD-10-CM | POA: Diagnosis not present

## 2019-09-13 DIAGNOSIS — M1990 Unspecified osteoarthritis, unspecified site: Secondary | ICD-10-CM | POA: Insufficient documentation

## 2019-09-13 DIAGNOSIS — Z85828 Personal history of other malignant neoplasm of skin: Secondary | ICD-10-CM | POA: Diagnosis not present

## 2019-09-13 DIAGNOSIS — F17210 Nicotine dependence, cigarettes, uncomplicated: Secondary | ICD-10-CM | POA: Diagnosis not present

## 2019-09-13 DIAGNOSIS — E039 Hypothyroidism, unspecified: Secondary | ICD-10-CM | POA: Diagnosis not present

## 2019-09-13 DIAGNOSIS — K219 Gastro-esophageal reflux disease without esophagitis: Secondary | ICD-10-CM | POA: Insufficient documentation

## 2019-09-13 DIAGNOSIS — J449 Chronic obstructive pulmonary disease, unspecified: Secondary | ICD-10-CM | POA: Diagnosis not present

## 2019-09-13 DIAGNOSIS — Z95828 Presence of other vascular implants and grafts: Secondary | ICD-10-CM

## 2019-09-13 HISTORY — PX: PORTACATH PLACEMENT: SHX2246

## 2019-09-13 SURGERY — INSERTION, TUNNELED CENTRAL VENOUS DEVICE, WITH PORT
Anesthesia: Monitor Anesthesia Care | Site: Chest | Laterality: Left

## 2019-09-13 MED ORDER — ONDANSETRON HCL 4 MG/2ML IJ SOLN
INTRAMUSCULAR | Status: DC | PRN
  Administered 2019-09-13: 4 mg via INTRAVENOUS

## 2019-09-13 MED ORDER — MIDAZOLAM HCL 2 MG/2ML IJ SOLN
INTRAMUSCULAR | Status: DC | PRN
  Administered 2019-09-13 (×2): 1 mg via INTRAVENOUS

## 2019-09-13 MED ORDER — FENTANYL CITRATE (PF) 100 MCG/2ML IJ SOLN: 25.0000 ug | INTRAMUSCULAR | Status: DC | PRN

## 2019-09-13 MED ORDER — MIDAZOLAM HCL 2 MG/2ML IJ SOLN
INTRAMUSCULAR | Status: AC
  Filled 2019-09-13: qty 2

## 2019-09-13 MED ORDER — CEFAZOLIN SODIUM-DEXTROSE 2-4 GM/100ML-% IV SOLN
2.0000 g | INTRAVENOUS | Status: AC
  Administered 2019-09-13: 2 g via INTRAVENOUS

## 2019-09-13 MED ORDER — SUCCINYLCHOLINE CHLORIDE 20 MG/ML IJ SOLN
INTRAMUSCULAR | Status: AC
  Filled 2019-09-13: qty 1

## 2019-09-13 MED ORDER — FENTANYL CITRATE (PF) 100 MCG/2ML IJ SOLN
INTRAMUSCULAR | Status: AC
  Filled 2019-09-13: qty 2

## 2019-09-13 MED ORDER — ONDANSETRON HCL 4 MG/2ML IJ SOLN: 4.0000 mg | Freq: Once | INTRAMUSCULAR | Status: DC | PRN

## 2019-09-13 MED ORDER — SODIUM CHLORIDE (PF) 0.9 % IJ SOLN
INTRAMUSCULAR | Status: DC | PRN
  Administered 2019-09-13: 50 mL

## 2019-09-13 MED ORDER — ONDANSETRON HCL 4 MG/2ML IJ SOLN
INTRAMUSCULAR | Status: AC
  Filled 2019-09-13: qty 2

## 2019-09-13 MED ORDER — SODIUM CHLORIDE (PF) 0.9 % IJ SOLN
INTRAMUSCULAR | Status: AC
  Filled 2019-09-13: qty 50

## 2019-09-13 MED ORDER — LACTATED RINGERS IV SOLN: INTRAVENOUS | Status: DC

## 2019-09-13 MED ORDER — ACETAMINOPHEN 10 MG/ML IV SOLN: 1000.0000 mg | Freq: Once | INTRAVENOUS | Status: DC | PRN

## 2019-09-13 MED ORDER — CEFAZOLIN SODIUM-DEXTROSE 2-4 GM/100ML-% IV SOLN
INTRAVENOUS | Status: AC
  Filled 2019-09-13: qty 100

## 2019-09-13 MED ORDER — OXYCODONE HCL 5 MG/5ML PO SOLN: 5.0000 mg | Freq: Once | ORAL | Status: DC | PRN

## 2019-09-13 MED ORDER — LIDOCAINE HCL (PF) 1 % IJ SOLN
INTRAMUSCULAR | Status: AC
  Filled 2019-09-13: qty 30

## 2019-09-13 MED ORDER — PROPOFOL 10 MG/ML IV BOLUS
INTRAVENOUS | Status: AC
  Filled 2019-09-13: qty 20

## 2019-09-13 MED ORDER — OXYCODONE HCL 5 MG PO TABS: 5.0000 mg | ORAL_TABLET | Freq: Once | ORAL | Status: DC | PRN

## 2019-09-13 MED ORDER — FENTANYL CITRATE (PF) 100 MCG/2ML IJ SOLN
INTRAMUSCULAR | Status: DC | PRN
  Administered 2019-09-13 (×2): 50 ug via INTRAVENOUS
  Administered 2019-09-13: 100 ug via INTRAVENOUS

## 2019-09-13 MED ORDER — LIDOCAINE HCL 1 % IJ SOLN
INTRAMUSCULAR | Status: DC | PRN
  Administered 2019-09-13: 10 mL

## 2019-09-13 SURGICAL SUPPLY — 30 items
BLADE SURG 15 STRL SS SAFETY (BLADE) ×3 IMPLANT
CHLORAPREP W/TINT 26 (MISCELLANEOUS) ×3 IMPLANT
CLOSURE WOUND 1/2 X4 (GAUZE/BANDAGES/DRESSINGS) ×1
COVER LIGHT HANDLE STERIS (MISCELLANEOUS) ×6 IMPLANT
COVER WAND RF STERILE (DRAPES) ×3 IMPLANT
DECANTER SPIKE VIAL GLASS SM (MISCELLANEOUS) ×6 IMPLANT
DRAPE C-ARM XRAY 36X54 (DRAPES) ×3 IMPLANT
DRAPE LAPAROTOMY TRNSV 106X77 (MISCELLANEOUS) ×3 IMPLANT
DRSG TEGADERM 2-3/8X2-3/4 SM (GAUZE/BANDAGES/DRESSINGS) ×3 IMPLANT
DRSG TEGADERM 4X4.75 (GAUZE/BANDAGES/DRESSINGS) ×3 IMPLANT
DRSG TELFA 4X3 1S NADH ST (GAUZE/BANDAGES/DRESSINGS) ×3 IMPLANT
ELECT REM PT RETURN 9FT ADLT (ELECTROSURGICAL) ×3
ELECTRODE REM PT RTRN 9FT ADLT (ELECTROSURGICAL) ×1 IMPLANT
GLOVE BIO SURGEON STRL SZ7.5 (GLOVE) ×3 IMPLANT
GLOVE INDICATOR 8.0 STRL GRN (GLOVE) ×3 IMPLANT
GOWN STRL REUS W/ TWL LRG LVL3 (GOWN DISPOSABLE) ×2 IMPLANT
GOWN STRL REUS W/TWL LRG LVL3 (GOWN DISPOSABLE) ×4
KIT PORT POWER 8FR ISP CVUE (Port) ×3 IMPLANT
KIT TURNOVER KIT A (KITS) ×3 IMPLANT
LABEL OR SOLS (LABEL) ×3 IMPLANT
NS IRRIG 500ML POUR BTL (IV SOLUTION) ×3 IMPLANT
PACK PORT-A-CATH (MISCELLANEOUS) ×3 IMPLANT
STRIP CLOSURE SKIN 1/2X4 (GAUZE/BANDAGES/DRESSINGS) ×2 IMPLANT
SUT PROLENE 3 0 SH DA (SUTURE) ×3 IMPLANT
SUT VIC AB 3-0 SH 27 (SUTURE) ×2
SUT VIC AB 3-0 SH 27X BRD (SUTURE) ×1 IMPLANT
SUT VIC AB 4-0 FS2 27 (SUTURE) ×3 IMPLANT
SWABSTK COMLB BENZOIN TINCTURE (MISCELLANEOUS) ×3 IMPLANT
SYR 10ML LL (SYRINGE) ×3 IMPLANT
SYR 10ML SLIP (SYRINGE) ×3 IMPLANT

## 2019-09-13 NOTE — Progress Notes (Signed)
Patient pre screened for office appointment, no questions or concerns today. Patient reminded of upcoming appointment time and date. 

## 2019-09-13 NOTE — H&P (Signed)
Sara David [id number] 1946-07-07     HPI: Patient with metastatic breast cancer requiring central venous access. For power port placement.  Medications Prior to Admission  Medication Sig Dispense Refill Last Dose  . acetaminophen (TYLENOL) 500 MG tablet Take 1,000 mg by mouth daily as needed for moderate pain or headache.    09/12/2019 at Unknown time  . citalopram (CELEXA) 20 MG tablet Take 20 mg by mouth every morning.    09/13/2019 at Unknown time  . levothyroxine (SYNTHROID, LEVOTHROID) 100 MCG tablet Take 100 mcg by mouth daily before breakfast.    09/13/2019 at Unknown time  . montelukast (SINGULAIR) 10 MG tablet Take 10 mg by mouth at bedtime.    09/12/2019 at Unknown time  . Multiple Vitamins-Minerals (CENTRUM SILVER PO) Take 1 tablet by mouth every morning.     Marland Kitchen omeprazole (PRILOSEC) 20 MG capsule Take 20 mg by mouth every morning.    09/13/2019 at Unknown time  . ondansetron (ZOFRAN) 8 MG tablet Take 1 tablet (8 mg total) by mouth 2 (two) times daily as needed (Nausea or vomiting). 30 tablet 1 09/12/2019 at Unknown time  . pravastatin (PRAVACHOL) 40 MG tablet Take 40 mg by mouth at bedtime.    09/12/2019 at Unknown time  . prochlorperazine (COMPAZINE) 10 MG tablet Take 1 tablet (10 mg total) by mouth every 6 (six) hours as needed (Nausea or vomiting). 30 tablet 1 09/12/2019 at Unknown time  . valsartan-hydrochlorothiazide (DIOVAN-HCT) 80-12.5 MG tablet Take 1 tablet by mouth daily.   09/12/2019 at Unknown time  . WIXELA INHUB 100-50 MCG/DOSE AEPB Inhale 1 puff into the lungs daily.    09/13/2019 at Unknown time  . zolpidem (AMBIEN) 10 MG tablet TAKE 1 TABLET BY MOUTH AT BEDTIME AS NEEDED FOR SLEEP (Patient taking differently: Take 10 mg by mouth at bedtime. ) 30 tablet 2 09/12/2019 at Unknown time  . IBRANCE 100 MG tablet TAKE 1 TABLET (100 MG TOTAL) BY MOUTH DAILY. TAKE FOR 21 DAYS ON, 7 DAYS OFF, REPEAT EVERY 28 DAYS. (Patient not taking: Reported on 08/23/2019) 21 tablet 4   . letrozole  (FEMARA) 2.5 MG tablet Take 1 tablet (2.5 mg total) by mouth daily. (Patient not taking: Reported on 08/30/2019) 90 tablet 4    Allergies  Allergen Reactions  . Carafate [Sucralfate] Nausea Only  . Diclofenac Sodium Nausea Only  . Erythromycin Diarrhea and Nausea And Vomiting  . Adhesive [Tape] Itching and Rash  . Wellbutrin [Bupropion] Itching and Rash   Past Medical History:  Diagnosis Date  . Anxiety   . Arthritis    "back" (04/03/2016)  . Breast cancer ([place]) 2012    no lumpectomy. Treating with meds- Right  . Cancer ([place])    small intestines 2012, gallbladder 2016  . Chronic kidney disease   . Chronic lower back pain   . COPD (chronic obstructive pulmonary disease) ([place])    "use inhalers for COPD that dx'd at one time" (04/03/2016)  . Dyspnea   . Family history of adverse reaction to anesthesia    daughter gets nauseated  . GERD (gastroesophageal reflux disease)   . High cholesterol   . Hypertension   . Hypothyroidism   . Skin cancer    "right neck; mid forehead"   Past Surgical History:  Procedure Laterality Date  . ABDOMINAL HERNIA REPAIR     "after they took tumor out of my colon I developed a hernia"  . ABDOMINAL HYSTERECTOMY    . BACK SURGERY    .  BREAST BIOPSY Right 2012   positive  . BREAST BIOPSY Right 2014   positive  . BREAST BIOPSY Right 06/26/2016   US guided - waiting for pathology  . BREAST EXCISIONAL BIOPSY Right 1995   benign  . CARPAL TUNNEL RELEASE Left   . DILATION AND CURETTAGE OF UTERUS     S/P miscarriage  . HERNIA REPAIR    . IR GENERIC HISTORICAL  04/04/2016   IR LUMBAR DISC ASPIRATION W/IMG GUIDE 04/04/2016 [name], MD [facility]  . JOINT REPLACEMENT    . LAPAROSCOPIC CHOLECYSTECTOMY  2016  . REPLACEMENT TOTAL HIP W/  RESURFACING IMPLANTS Bilateral   . SKIN CANCER EXCISION     "right neck; mid forehead"  . TOTAL HIP ARTHROPLASTY Right 10/03/2015   Procedure: TOTAL HIP ARTHROPLASTY ANTERIOR APPROACH;  Surgeon: Hessie Knows,  MD;  Location: [facility] ORS;  Service: Orthopedics;  Laterality: Right;  . TOTAL HIP ARTHROPLASTY Left 07/25/2016   Procedure: TOTAL HIP ARTHROPLASTY ANTERIOR APPROACH;  Surgeon: Hessie Knows, MD;  Location: [facility] ORS;  Service: Orthopedics;  Laterality: Left;  . TUBAL LIGATION    . TUMOR EXCISION  2012   "small intestine; came from the breast cancer"   Social History   Socioeconomic History  . Marital status: Married    Spouse name: Not on file  . Number of children: Not on file  . Years of education: Not on file  . Highest education level: Not on file  Occupational History  . Not on file  Tobacco Use  . Smoking status: Current Every Day Smoker    Packs/day: 1.00    Years: 54.00    Pack years: 54.00    Types: Cigarettes  . Smokeless tobacco: Never Used  . Tobacco comment: not ready to quit  Substance and Sexual Activity  . Alcohol use: No  . Drug use: No  . Sexual activity: Not Currently  Other Topics Concern  . Not on file  Social History Narrative  . Not on file   Social Determinants of Health   Financial Resource Strain:   . Difficulty of Paying Living Expenses: Not on file  Food Insecurity:   . Worried About Charity fundraiser in the Last Year: Not on file  . Ran Out of Food in the Last Year: Not on file  Transportation Needs:   . Lack of Transportation (Medical): Not on file  . Lack of Transportation (Non-Medical): Not on file  Physical Activity:   . Days of Exercise per Week: Not on file  . Minutes of Exercise per Session: Not on file  Stress:   . Feeling of Stress : Not on file  Social Connections:   . Frequency of Communication with Friends and Family: Not on file  . Frequency of Social Gatherings with Friends and Family: Not on file  . Attends Religious Services: Not on file  . Active Member of Clubs or Organizations: Not on file  . Attends Archivist Meetings: Not on file  . Marital Status: Not on file  Intimate Partner Violence:   . Fear of  Current or Ex-Partner: Not on file  . Emotionally Abused: Not on file  . Physically Abused: Not on file  . Sexually Abused: Not on file   Social History   Social History Narrative  . Not on file     ROS: Negative.     PE: HEENT: Negative. Lungs: Clear. Cardio: RRForest Gleason Naraly Fritcher 09/13/2019   Assessment/Plan:  Proceed with planned left  power placement.

## 2019-09-13 NOTE — Op Note (Signed)
Preoperative diagnosis: Metastatic breast cancer, need for central venous access.  Postoperative diagnosis: Same.  Operative procedure: Left subclavian PowerPort placement with ultrasound and fluoroscopic guidance.  Operating surgeon: Hervey Ard, MD.  Anesthesia: Attended local, 10 cc 1% plain Xylocaine.  Estimated blood loss: None.  Clinical note: This 74 year old woman has had a long battle with breast cancer.  She is now requiring intravenous chemotherapy and central venous access was requested by her treating oncologist.  Operative note: The patient had SCD stockings placed and received Ancef prior to the procedure.  The left chest and neck was prepped with ChloraPrep and draped.  Ultrasound was used to confirm patency of the left subclavian vein.  This was then cannulated under ultrasound guidance after local anesthetic was infiltrated for comfort.  The guidewire was advanced into the SVC followed by the dilator and the catheter.  Under fluoroscopy as this was position at the junction of the SVC and right atrium.  It was tunneled to a pocket on the left chest.  The port was anchored to the deep tissue with interrupted 3-0 Prolene sutures x2.  The wound was closed in layers with running 3-0 Vicryl for the adipose tissue and a running 4-0 Vicryl subcuticular suture for the skin.  Benzoin and Steri-Strips were applied.  The port was cannulated and easily irrigated and aspirated.  As she is due for treatment tomorrow it was maintained cannulated with the tubing capped and clamped for safety.  Telfa and occlusive Tegaderm dressing was applied.  The patient tolerated the procedure well and was taken to recovery in stable condition.  Erect chest x-ray obtained in the recovery room showed the catheter tip as described above and no evidence of pneumothorax.

## 2019-09-13 NOTE — Discharge Instructions (Signed)

## 2019-09-13 NOTE — Anesthesia Preprocedure Evaluation (Signed)
Anesthesia Evaluation  Patient identified by MRN, date of birth, ID band Patient awake    Reviewed: Allergy & Precautions, NPO status , Patient's Chart, lab work & pertinent test results  History of Anesthesia Complications Negative for: history of anesthetic complications  Airway Mallampati: II  TM Distance: >3 FB Neck ROM: Full    Dental no notable dental hx. (+) Teeth Intact, Poor Dentition   Pulmonary neg sleep apnea, COPD,  COPD inhaler, Current SmokerPatient did not abstain from smoking.,    Pulmonary exam normal breath sounds clear to auscultation       Cardiovascular Exercise Tolerance: Good METShypertension, (-) CAD and (-) Past MI (-) dysrhythmias  Rhythm:Regular Rate:Normal - Systolic murmurs    Neuro/Psych Anxiety negative neurological ROS     GI/Hepatic GERD  ,(+)     (-) substance abuse  ,   Endo/Other  neg diabetesHypothyroidism   Renal/GU CRFRenal disease     Musculoskeletal   Abdominal   Peds  Hematology   Anesthesia Other Findings Past Medical History: No date: Anxiety No date: Arthritis     Comment:  "back" (04/03/2016) 2012 : Breast cancer ([place])     Comment:  no lumpectomy. Treating with meds- Right No date: Cancer [facility])     Comment:  small intestines 2012, gallbladder 2016 No date: Chronic kidney disease No date: Chronic lower back pain No date: COPD (chronic obstructive pulmonary disease) ([facility])     Comment:  "use inhalers for COPD that dx'd at one time"               (04/03/2016) No date: Dyspnea No date: Family history of adverse reaction to anesthesia     Comment:  daughter gets nauseated No date: GERD (gastroesophageal reflux disease) No date: High cholesterol No date: Hypertension No date: Hypothyroidism No date: Skin cancer     Comment:  "right neck; mid forehead"  Reproductive/Obstetrics                             Anesthesia Physical Anesthesia  Plan  ASA: III  Anesthesia Plan: MAC   Post-op Pain Management:    Induction: Intravenous  PONV Risk Score and Plan: 1 and Ondansetron, Midazolam and Treatment may vary due to age or medical condition  Airway Management Planned: Nasal Cannula  Additional Equipment: None  Intra-op Plan:   Post-operative Plan: Extubation in OR  Informed Consent: I have reviewed the patients History and Physical, chart, labs and discussed the procedure including the risks, benefits and alternatives for the proposed anesthesia with the patient or authorized representative who has indicated his/her understanding and acceptance.       Plan Discussed with: Surgeon  Anesthesia Plan Comments: (Discussed risks of anesthesia with patient, including PONV, conversion to general anesthesia and its associated risks such as sore throat, lip/dental damage. Rare risks discussed as well, such as cardiorespiratory sequelae. Patient understands.)        Anesthesia Quick Evaluation

## 2019-09-13 NOTE — Transfer of Care (Signed)
Immediate Anesthesia Transfer of Care Note  Patient: Sara David  Procedure(s) Performed: INSERTION PORT-A-CATH (Left Chest)  Patient Location: PACU  Anesthesia Type:MAC  Level of Consciousness: drowsy  Airway & Oxygen Therapy: Patient connected to nasal cannula oxygen  Post-op Assessment: Report given to RN, Post -op Vital signs reviewed and stable and Patient moving all extremities  Post vital signs: Reviewed and stable  Last Vitals:  Vitals Value Taken Time  BP 107/52 09/13/19 0826  Temp    Pulse    Resp 12 09/13/19 0827  SpO2    Vitals shown include unvalidated device data.  Last Pain:  Vitals:   09/13/19 0626  TempSrc: Temporal  PainSc: 0-No pain         Complications: No apparent anesthesia complications

## 2019-09-13 NOTE — OR Nursing (Signed)
Per Dr. Bary Castilla verbal [phone], he does not need to see patient in postop prior to discharge.

## 2019-09-13 NOTE — Anesthesia Postprocedure Evaluation (Signed)
Anesthesia Post Note  Patient: Sara David  Procedure(s) Performed: INSERTION PORT-A-CATH (Left Chest)  Patient location during evaluation: PACU Anesthesia Type: MAC Level of consciousness: awake and alert Pain management: pain level controlled Vital Signs Assessment: post-procedure vital signs reviewed and stable Respiratory status: spontaneous breathing, nonlabored ventilation, respiratory function stable and patient connected to nasal cannula oxygen Cardiovascular status: stable and blood pressure returned to baseline Postop Assessment: no apparent nausea or vomiting Anesthetic complications: no     Last Vitals:  Vitals:   09/13/19 0913 09/13/19 0931  BP: (!) 120/58 117/69  Pulse: 68 63  Resp: 16 16  Temp: (!) 36.1 C 36.7 C  SpO2: 97% 99%    Last Pain:  Vitals:   09/13/19 0931  TempSrc: Temporal  PainSc: 2                  Arita Miss

## 2019-09-14 ENCOUNTER — Other Ambulatory Visit: Payer: Self-pay

## 2019-09-14 ENCOUNTER — Inpatient Hospital Stay: Payer: Medicare Other | Admitting: *Deleted

## 2019-09-14 ENCOUNTER — Inpatient Hospital Stay: Payer: Medicare Other

## 2019-09-14 ENCOUNTER — Other Ambulatory Visit: Payer: Self-pay | Admitting: Emergency Medicine

## 2019-09-14 ENCOUNTER — Inpatient Hospital Stay (HOSPITAL_BASED_OUTPATIENT_CLINIC_OR_DEPARTMENT_OTHER): Payer: Medicare Other | Admitting: Oncology

## 2019-09-14 ENCOUNTER — Encounter: Payer: Self-pay | Admitting: Oncology

## 2019-09-14 VITALS — BP 102/53 | HR 67 | Temp 97.2°F | Resp 18 | Wt 151.0 lb

## 2019-09-14 DIAGNOSIS — Z5111 Encounter for antineoplastic chemotherapy: Secondary | ICD-10-CM | POA: Diagnosis not present

## 2019-09-14 DIAGNOSIS — Z95828 Presence of other vascular implants and grafts: Secondary | ICD-10-CM

## 2019-09-14 DIAGNOSIS — C50911 Malignant neoplasm of unspecified site of right female breast: Secondary | ICD-10-CM | POA: Diagnosis not present

## 2019-09-14 DIAGNOSIS — C50919 Malignant neoplasm of unspecified site of unspecified female breast: Secondary | ICD-10-CM

## 2019-09-14 LAB — CBC WITH DIFFERENTIAL/PLATELET
Abs Immature Granulocytes: 0.04 10*3/uL (ref 0.00–0.07)
Basophils Absolute: 0.1 10*3/uL (ref 0.0–0.1)
Basophils Relative: 1 %
Eosinophils Absolute: 0 10*3/uL (ref 0.0–0.5)
Eosinophils Relative: 1 %
HCT: 29 % — ABNORMAL LOW (ref 36.0–46.0)
Hemoglobin: 9 g/dL — ABNORMAL LOW (ref 12.0–15.0)
Immature Granulocytes: 1 %
Lymphocytes Relative: 16 %
Lymphs Abs: 0.7 10*3/uL (ref 0.7–4.0)
MCH: 33 pg (ref 26.0–34.0)
MCHC: 31 g/dL (ref 30.0–36.0)
MCV: 106.2 fL — ABNORMAL HIGH (ref 80.0–100.0)
Monocytes Absolute: 0.5 10*3/uL (ref 0.1–1.0)
Monocytes Relative: 11 %
Neutro Abs: 3 10*3/uL (ref 1.7–7.7)
Neutrophils Relative %: 70 %
Platelets: 177 10*3/uL (ref 150–400)
RBC: 2.73 MIL/uL — ABNORMAL LOW (ref 3.87–5.11)
RDW: 14.6 % (ref 11.5–15.5)
WBC: 4.3 10*3/uL (ref 4.0–10.5)
nRBC: 0 % (ref 0.0–0.2)

## 2019-09-14 LAB — COMPREHENSIVE METABOLIC PANEL
ALT: 44 U/L (ref 0–44)
AST: 40 U/L (ref 15–41)
Albumin: 3.7 g/dL (ref 3.5–5.0)
Alkaline Phosphatase: 152 U/L — ABNORMAL HIGH (ref 38–126)
Anion gap: 8 (ref 5–15)
BUN: 31 mg/dL — ABNORMAL HIGH (ref 8–23)
CO2: 25 mmol/L (ref 22–32)
Calcium: 8.8 mg/dL — ABNORMAL LOW (ref 8.9–10.3)
Chloride: 106 mmol/L (ref 98–111)
Creatinine, Ser: 1.11 mg/dL — ABNORMAL HIGH (ref 0.44–1.00)
GFR calc Af Amer: 57 mL/min — ABNORMAL LOW (ref >60)
GFR calc non Af Amer: 49 mL/min — ABNORMAL LOW (ref >60)
Glucose, Bld: 89 mg/dL (ref 70–99)
Potassium: 4.2 mmol/L (ref 3.5–5.1)
Sodium: 139 mmol/L (ref 135–145)
Total Bilirubin: 0.6 mg/dL (ref 0.3–1.2)
Total Protein: 6.7 g/dL (ref 6.5–8.1)

## 2019-09-14 MED ORDER — HEPARIN SOD (PORK) LOCK FLUSH 100 UNIT/ML IV SOLN
500.0000 [IU] | Freq: Once | INTRAVENOUS | Status: AC | PRN
  Administered 2019-09-14: 11:00:00 500 [IU]
  Filled 2019-09-14: qty 5

## 2019-09-14 MED ORDER — SODIUM CHLORIDE 0.9 % IV SOLN
Freq: Once | INTRAVENOUS | Status: AC
  Filled 2019-09-14: qty 250

## 2019-09-14 MED ORDER — PROCHLORPERAZINE MALEATE 10 MG PO TABS
10.0000 mg | ORAL_TABLET | Freq: Once | ORAL | Status: AC
  Administered 2019-09-14: 10:00:00 10 mg via ORAL
  Filled 2019-09-14: qty 1

## 2019-09-14 MED ORDER — HEPARIN SOD (PORK) LOCK FLUSH 100 UNIT/ML IV SOLN
INTRAVENOUS | Status: AC
  Filled 2019-09-14: qty 5

## 2019-09-14 MED ORDER — SODIUM CHLORIDE 0.9 % IV SOLN
1.4000 mg/m2 | Freq: Once | INTRAVENOUS | Status: AC
  Administered 2019-09-14: 11:00:00 2.4 mg via INTRAVENOUS
  Filled 2019-09-14: qty 4.8

## 2019-09-14 MED ORDER — SODIUM CHLORIDE 0.9% FLUSH
10.0000 mL | Freq: Once | INTRAVENOUS | Status: AC
  Administered 2019-09-14: 10 mL via INTRAVENOUS
  Filled 2019-09-14: qty 10

## 2019-09-14 MED ORDER — LIDOCAINE-PRILOCAINE 2.5-2.5 % EX CREA: TOPICAL_CREAM | Freq: Every day | CUTANEOUS | Status: DC | PRN

## 2019-09-15 LAB — CANCER ANTIGEN 27.29: CA 27.29: 66.9 U/mL — ABNORMAL HIGH (ref 0.0–38.6)

## 2019-09-17 NOTE — Progress Notes (Signed)
  [place]  Telephone:(336) [phone] Fax:(336) [phone]  ID: [name] OB: 1946/02/05  MR#: [id number]  [id number]    Lloyd Huger, MD   09/17/2019 6:44 AM         This encounter was created in error - please disregard.

## 2019-09-20 ENCOUNTER — Encounter: Payer: Self-pay | Admitting: Oncology

## 2019-09-20 NOTE — Progress Notes (Signed)
Patient prescreened for appointment. Patient has no concerns or questions.  

## 2019-09-21 ENCOUNTER — Inpatient Hospital Stay: Payer: Medicare Other

## 2019-09-21 ENCOUNTER — Inpatient Hospital Stay: Payer: Medicare Other | Admitting: Oncology

## 2019-09-23 ENCOUNTER — Inpatient Hospital Stay: Payer: Medicare Other

## 2019-09-23 ENCOUNTER — Other Ambulatory Visit: Payer: Medicare Other

## 2019-09-23 ENCOUNTER — Ambulatory Visit: Payer: Medicare Other | Admitting: Oncology

## 2019-09-23 ENCOUNTER — Encounter: Payer: Self-pay | Admitting: Oncology

## 2019-09-23 ENCOUNTER — Inpatient Hospital Stay: Payer: Medicare Other | Attending: Oncology | Admitting: Oncology

## 2019-09-23 ENCOUNTER — Other Ambulatory Visit: Payer: Self-pay

## 2019-09-23 VITALS — BP 116/59 | HR 72 | Temp 97.0°F | Resp 18 | Wt 148.4 lb

## 2019-09-23 DIAGNOSIS — C7951 Secondary malignant neoplasm of bone: Secondary | ICD-10-CM | POA: Diagnosis present

## 2019-09-23 DIAGNOSIS — Z5111 Encounter for antineoplastic chemotherapy: Secondary | ICD-10-CM | POA: Diagnosis present

## 2019-09-23 DIAGNOSIS — Z95828 Presence of other vascular implants and grafts: Secondary | ICD-10-CM

## 2019-09-23 DIAGNOSIS — C50919 Malignant neoplasm of unspecified site of unspecified female breast: Secondary | ICD-10-CM

## 2019-09-23 DIAGNOSIS — C7989 Secondary malignant neoplasm of other specified sites: Secondary | ICD-10-CM | POA: Diagnosis not present

## 2019-09-23 DIAGNOSIS — C784 Secondary malignant neoplasm of small intestine: Secondary | ICD-10-CM | POA: Insufficient documentation

## 2019-09-23 DIAGNOSIS — C50911 Malignant neoplasm of unspecified site of right female breast: Secondary | ICD-10-CM

## 2019-09-23 DIAGNOSIS — C50411 Malignant neoplasm of upper-outer quadrant of right female breast: Secondary | ICD-10-CM | POA: Insufficient documentation

## 2019-09-23 LAB — COMPREHENSIVE METABOLIC PANEL
ALT: 50 U/L — ABNORMAL HIGH (ref 0–44)
AST: 35 U/L (ref 15–41)
Albumin: 3.8 g/dL (ref 3.5–5.0)
Alkaline Phosphatase: 147 U/L — ABNORMAL HIGH (ref 38–126)
Anion gap: 10 (ref 5–15)
BUN: 27 mg/dL — ABNORMAL HIGH (ref 8–23)
CO2: 24 mmol/L (ref 22–32)
Calcium: 9 mg/dL (ref 8.9–10.3)
Chloride: 103 mmol/L (ref 98–111)
Creatinine, Ser: 1.08 mg/dL — ABNORMAL HIGH (ref 0.44–1.00)
GFR calc Af Amer: 59 mL/min — ABNORMAL LOW (ref >60)
GFR calc non Af Amer: 51 mL/min — ABNORMAL LOW (ref >60)
Glucose, Bld: 124 mg/dL — ABNORMAL HIGH (ref 70–99)
Potassium: 3.9 mmol/L (ref 3.5–5.1)
Sodium: 137 mmol/L (ref 135–145)
Total Bilirubin: 0.5 mg/dL (ref 0.3–1.2)
Total Protein: 7 g/dL (ref 6.5–8.1)

## 2019-09-23 LAB — CBC WITH DIFFERENTIAL/PLATELET
Abs Immature Granulocytes: 0.04 10*3/uL (ref 0.00–0.07)
Basophils Absolute: 0 10*3/uL (ref 0.0–0.1)
Basophils Relative: 1 %
Eosinophils Absolute: 0 10*3/uL (ref 0.0–0.5)
Eosinophils Relative: 1 %
HCT: 29.9 % — ABNORMAL LOW (ref 36.0–46.0)
Hemoglobin: 9.4 g/dL — ABNORMAL LOW (ref 12.0–15.0)
Immature Granulocytes: 1 %
Lymphocytes Relative: 14 %
Lymphs Abs: 0.7 10*3/uL (ref 0.7–4.0)
MCH: 32.6 pg (ref 26.0–34.0)
MCHC: 31.4 g/dL (ref 30.0–36.0)
MCV: 103.8 fL — ABNORMAL HIGH (ref 80.0–100.0)
Monocytes Absolute: 0.4 10*3/uL (ref 0.1–1.0)
Monocytes Relative: 8 %
Neutro Abs: 3.7 10*3/uL (ref 1.7–7.7)
Neutrophils Relative %: 75 %
Platelets: 218 10*3/uL (ref 150–400)
RBC: 2.88 MIL/uL — ABNORMAL LOW (ref 3.87–5.11)
RDW: 14.7 % (ref 11.5–15.5)
WBC: 4.9 10*3/uL (ref 4.0–10.5)
nRBC: 0 % (ref 0.0–0.2)

## 2019-09-23 MED ORDER — PROCHLORPERAZINE MALEATE 10 MG PO TABS
10.0000 mg | ORAL_TABLET | Freq: Once | ORAL | Status: AC
  Administered 2019-09-23: 10 mg via ORAL
  Filled 2019-09-23: qty 1

## 2019-09-23 MED ORDER — SODIUM CHLORIDE 0.9 % IV SOLN
Freq: Once | INTRAVENOUS | Status: AC
  Filled 2019-09-23: qty 250

## 2019-09-23 MED ORDER — HEPARIN SOD (PORK) LOCK FLUSH 100 UNIT/ML IV SOLN
INTRAVENOUS | Status: AC
  Filled 2019-09-23: qty 5

## 2019-09-23 MED ORDER — SODIUM CHLORIDE 0.9% FLUSH
10.0000 mL | Freq: Once | INTRAVENOUS | Status: AC
  Administered 2019-09-23: 09:00:00 10 mL via INTRAVENOUS
  Filled 2019-09-23: qty 10

## 2019-09-23 MED ORDER — ZOLEDRONIC ACID 4 MG/5ML IV CONC
3.3000 mg | Freq: Once | INTRAVENOUS | Status: AC
  Administered 2019-09-23: 3.3 mg via INTRAVENOUS
  Filled 2019-09-23: qty 4.13

## 2019-09-23 MED ORDER — SODIUM CHLORIDE 0.9 % IV SOLN
1.4000 mg/m2 | Freq: Once | INTRAVENOUS | Status: AC
  Administered 2019-09-23: 2.4 mg via INTRAVENOUS
  Filled 2019-09-23: qty 4.8

## 2019-09-23 MED ORDER — HEPARIN SOD (PORK) LOCK FLUSH 100 UNIT/ML IV SOLN
500.0000 [IU] | Freq: Once | INTRAVENOUS | Status: AC | PRN
  Administered 2019-09-23: 500 [IU]
  Filled 2019-09-23: qty 5

## 2019-09-23 NOTE — Progress Notes (Signed)
Pt in for follow up, denies any concerns today. 

## 2019-09-23 NOTE — Progress Notes (Signed)
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  INTERVAL HISTORY: Patient returns to clinic today for further evaluation and consideration of cycle 2, day 8 of Hal[place].  Treatment was delayed several days secondary to confusion with appointments.  She continues to tolerate her treatments well without significant side effects.  She continues to have chronic low back pain.  She has no neurologic complaints.  She denies any recent fevers or illnesses.  She denies any chest pain, shortness of breath, cough, or hemoptysis.  She has a good appetite and has gained some weight in the interim.  She denies any nausea, vomiting, constipation, or diarrhea.  She has no melena or hematochezia.  She has no urinary complaints.  Patient offers no further specific complaints today.  REVIEW OF SYSTEMS:   Review of Systems  Constitutional: Negative.  Negative for fever, malaise/fatigue and weight loss.  Respiratory: Negative.  Negative for cough and shortness of breath.   Cardiovascular: Negative.  Negative for chest pain and leg swelling.  Gastrointestinal: Negative.  Negative for abdominal pain, blood in stool, diarrhea and melena.  Genitourinary: Negative.   Musculoskeletal: Positive for back pain. Negative for joint pain.  Skin: Negative.  Negative for rash.  Neurological: Negative.  Negative for dizziness, sensory change, focal weakness, weakness and headaches.  Psychiatric/Behavioral: Negative.  The patient is not nervous/anxious.       As per HPI. Otherwise, a complete review of systems is negative.  PAST MEDICAL HISTORY: Past Medical History:  Diagnosis Date  . Anxiety   . Arthritis    "back" (04/03/2016)  . Breast cancer ([place]) 2012    no lumpectomy. Treating with meds- Right  . Cancer ([place])    small intestines 2012, gallbladder 2016  . Chronic kidney disease   . Chronic lower back pain   . COPD (chronic obstructive pulmonary disease) ([place])    "use inhalers for COPD that dx'd at one time" (04/03/2016)  . Dyspnea   . Family history of adverse reaction to anesthesia    daughter gets nauseated  . GERD (gastroesophageal reflux disease)   . High cholesterol   . Hypertension   . Hypothyroidism   . Skin cancer    "right neck; mid forehead"    PAST SURGICAL HISTORY: Past Surgical History:  Procedure Laterality Date  . ABDOMINAL HERNIA REPAIR     "after they took tumor out of my colon I developed a hernia"  . ABDOMINAL HYSTERECTOMY    . BACK SURGERY    . BREAST BIOPSY Right 2012   positive  . BREAST BIOPSY Right 2014   positive  . BREAST BIOPSY Right 06/26/2016   US guided - waiting for pathology  . BREAST EXCISIONAL BIOPSY Right 1995   benign  . CARPAL TUNNEL RELEASE Left   . DILATION AND CURETTAGE OF UTERUS     S/P miscarriage  . HERNIA REPAIR    . IR GENERIC HISTORICAL  04/04/2016   IR LUMBAR DISC ASPIRATION W/IMG GUIDE 04/04/2016 [name], MD [facility]  . JOINT REPLACEMENT    . LAPAROSCOPIC CHOLECYSTECTOMY  2016  .  PORTACATH PLACEMENT Left 09/13/2019   Procedure: INSERTION PORT-A-CATH;  Surgeon: Robert Bellow, MD;  Location: [facility] ORS;  Service: General;  Laterality: Left;  . REPLACEMENT TOTAL HIP W/  RESURFACING IMPLANTS Bilateral   . SKIN CANCER EXCISION     "right neck; mid forehead"  . TOTAL HIP ARTHROPLASTY Right 10/03/2015   Procedure: TOTAL HIP ARTHROPLASTY ANTERIOR APPROACH;  Surgeon: Hessie Knows, MD;  Location: [facility] ORS;  Service: Orthopedics;  Laterality: Right;  .  TOTAL HIP ARTHROPLASTY Left 07/25/2016   Procedure: TOTAL HIP ARTHROPLASTY ANTERIOR APPROACH;  Surgeon: Hessie Knows, MD;  Location: [facility] ORS;  Service: Orthopedics;  Laterality: Left;  . TUBAL LIGATION    . TUMOR EXCISION  2012   "small intestine; came from the breast cancer"    FAMILY HISTORY: Reviewed and unchanged. No reported history of breast cancer or other chronic diseases.     ADVANCED DIRECTIVES:    HEALTH MAINTENANCE: Social History   Tobacco Use  . Smoking status: Current Every Day Smoker    Packs/day: 1.00    Years: 54.00    Pack years: 54.00    Types: Cigarettes  . Smokeless tobacco: Never Used  . Tobacco comment: not ready to quit  Substance Use Topics  . Alcohol use: No  . Drug use: No     Colonoscopy:  PAP:  Bone density:  Lipid panel:  Allergies  Allergen Reactions  . Carafate [Sucralfate] Nausea Only  . Diclofenac Sodium Nausea Only  . Erythromycin Diarrhea and Nausea And Vomiting  . Adhesive [Tape] Itching and Rash  . Wellbutrin [Bupropion] Itching and Rash    Current Outpatient Medications  Medication Sig Dispense Refill  . acetaminophen (TYLENOL) 500 MG tablet Take 1,000 mg by mouth daily as needed for moderate pain or headache.     . citalopram (CELEXA) 20 MG tablet Take 20 mg by mouth every morning.     Marland Kitchen levothyroxine (SYNTHROID, LEVOTHROID) 100 MCG tablet Take 100 mcg by mouth daily before breakfast.     . montelukast (SINGULAIR) 10 MG tablet Take 10 mg by mouth at bedtime.     . Multiple Vitamins-Minerals (CENTRUM SILVER PO) Take 1 tablet by mouth every morning.    Marland Kitchen omeprazole (PRILOSEC) 20 MG capsule Take 20 mg by mouth every morning.     . ondansetron (ZOFRAN) 8 MG tablet Take 1 tablet (8 mg total) by mouth 2 (two) times daily as needed (Nausea or vomiting). 30 tablet 1  . pravastatin (PRAVACHOL) 40 MG tablet Take 40 mg by mouth at bedtime.     . prochlorperazine (COMPAZINE) 10 MG tablet Take 1 tablet (10 mg total) by mouth every 6  (six) hours as needed (Nausea or vomiting). 30 tablet 1  . valsartan-hydrochlorothiazide (DIOVAN-HCT) 80-12.5 MG tablet Take 1 tablet by mouth daily.    Grant Ruts INHUB 100-50 MCG/DOSE AEPB Inhale 1 puff into the lungs daily.     Marland Kitchen zolpidem (AMBIEN) 10 MG tablet TAKE 1 TABLET BY MOUTH AT BEDTIME AS NEEDED FOR SLEEP (Patient taking differently: Take 10 mg by mouth at bedtime. ) 30 tablet 2   No current facility-administered medications for this visit.    OBJECTIVE: Vitals:   09/23/19 0931  BP: (!) 116/59  Pulse: 72  Resp: 18  Temp: (!) 97 F (36.1 C)     Body mass index is 25.47 kg/m.    ECOG FS:0 - Asymptomatic  General: Well-developed, well-nourished, no acute distress. Eyes: Pink conjunctiva, anicteric sclera. HEENT: Normocephalic, moist mucous membranes.  Lungs: No audible wheezing or coughing. Heart: Regular rate and rhythm. Abdomen: Soft, nontender, no obvious distention. Musculoskeletal: No edema, cyanosis, or clubbing. Neuro: Alert, answering all questions appropriately. Cranial nerves grossly intact. Skin: No rashes or petechiae noted. Psych: Normal affect.   LAB RESULTS: Lab Results  Component Value Date   NA 137 09/23/2019   K 3.9 09/23/2019   CL 103 09/23/2019   CO2 24 09/23/2019   GLUCOSE 124 (H) 09/23/2019   BUN 27 (H) 09/23/2019   CREATININE 1.08 (H) 09/23/2019   CALCIUM 9.0 09/23/2019   PROT 7.0 09/23/2019   ALBUMIN 3.8 09/23/2019   AST 35 09/23/2019   ALT 50 (H) 09/23/2019   ALKPHOS 147 (H) 09/23/2019   BILITOT 0.5 09/23/2019   GFRNONAA 51 (L) 09/23/2019   GFRAA 59 (L) 09/23/2019    Lab Results  Component Value Date   WBC 4.9 09/23/2019   NEUTROABS 3.7 09/23/2019   HGB 9.4 (L) 09/23/2019   HCT 29.9 (L) 09/23/2019   MCV 103.8 (H) 09/23/2019   PLT 218 09/23/2019    STUDIES: DG Chest Port 1 View  Result Date: 09/13/2019 CLINICAL DATA:  Port-A-Cath placement EXAM: PORTABLE CHEST 1 VIEW COMPARISON:  CT 01/15/2019 FINDINGS: Interval placement  of a left subclavian approach Port-A-Cath with distal tip terminating at the level of the superior cavoatrial junction. Heart size is normal. No focal airspace consolidation, pleural effusion, or pneumothorax. Mildly heterogeneous appearance of the visualized osseous structures, including sclerotic focus in the right humeral head, compatible with known osseous metastatic disease. IMPRESSION: Interval placement of a left-sided Port-A-Cath.  No pneumothorax. Electronically Signed   By: Davina Poke D.O.   On: 09/13/2019 08:44   DG C-Arm 1-60 Min-No Report  Result Date: 09/13/2019 Fluoroscopy was utilized by the requesting physician.  No radiographic interpretation.    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  PLAN:    1.  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  PET scan results from July 19, 2019 reviewed independently with new metastatic lesions in bone.  Patient has no evidence of visceral disease.  She also has a slowly increasing CA 27-29.  After lengthy discussion, patient agreed to discontinue Ibrance and letrozole.  Patient will receive Halaven on days 1 and 8 with a 15 off.  Plan to repeat PET scan after approximately 4 cycles.  Patient last received Zometa on August 24, 2019.  Proceed with cycle 2, day 8 of Halaven today.  Return to clinic in 2 weeks for further evaluation and consideration of cycle 3, day 1. 2.  Hip/back pain: Chronic and unchanged.  Patient is status post bilateral hip replacement. 3.  Anemia: Chronic and unchanged.  Hemoglobin has trended up to 9.4. 4.  Renal insufficiency: Improved.  Patient's most recent creatinine is 1.08.  Proceed with treatment as above.  Appreciate nephrology input. 5.  Leukopenia:  Resolved. 6.  Thrombocytopenia: Resolved. 7.  Bony lesions: Will include Zometa every 4 to 6 weeks with her treatments.  8.  Elevated liver enzymes: ALT mildly elevated at 50.  Proceed with treatment as above. 9.  Venous access: Patient now has had port placement.  Patient expressed understanding and was in agreement with this plan. She also understands that She can call clinic at any time with  any questions, concerns, or complaints.    Lloyd Huger, MD   09/23/2019 12:25 PM

## 2019-09-24 LAB — CANCER ANTIGEN 27.29: CA 27.29: 66.3 U/mL — ABNORMAL HIGH (ref 0.0–38.6)

## 2019-10-02 NOTE — Progress Notes (Deleted)
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  INTERVAL HISTORY: Patient returns to clinic today for further evaluation and consideration of cycle 2, day 8 of Halaven.  Treatment was delayed several days secondary to confusion with appointments.  She continues to tolerate her treatments well without significant side effects.  She continues to have chronic low back pain.  She has no neurologic complaints.  She denies any recent fevers or illnesses.  She denies any chest pain, shortness of breath, cough, or hemoptysis.  She has a good appetite and has gained some weight in the interim.  She denies any nausea, vomiting, constipation, or diarrhea.  She has no melena or hematochezia.  She has no urinary complaints.  Patient offers no further specific complaints today.  REVIEW OF SYSTEMS:   Review of Systems  Constitutional: Negative.  Negative for fever, malaise/fatigue and weight loss.  Respiratory: Negative.  Negative for cough and shortness of breath.   Cardiovascular: Negative.  Negative for chest pain and leg swelling.  Gastrointestinal: Negative.  Negative for abdominal pain, blood in stool, diarrhea and melena.  Genitourinary: Negative.   Musculoskeletal: Positive for back pain. Negative for joint pain.  Skin: Negative.  Negative for rash.  Neurological: Negative.  Negative for dizziness, sensory change, focal weakness, weakness and headaches.  Psychiatric/Behavioral: Negative.  The patient is not nervous/anxious.       As per HPI. Otherwise, a complete review of systems is negative.  PAST MEDICAL HISTORY: Past Medical History:  Diagnosis Date  . Anxiety   . Arthritis    "back" (04/03/2016)  . Breast cancer ([place]) 2012    no lumpectomy. Treating with meds- Right  . Cancer ([place])    small intestines 2012, gallbladder 2016  . Chronic kidney disease   . Chronic lower back pain   . COPD (chronic obstructive pulmonary disease) ([place])    "use inhalers for COPD that dx'd at one time" (04/03/2016)  . Dyspnea   . Family history of adverse reaction to anesthesia    daughter gets nauseated  . GERD (gastroesophageal reflux disease)   . High cholesterol   . Hypertension   . Hypothyroidism   . Skin cancer    "right neck; mid forehead"    PAST SURGICAL HISTORY: Past Surgical History:  Procedure Laterality Date  . ABDOMINAL HERNIA REPAIR     "after they took tumor out of my colon I developed a hernia"  . ABDOMINAL HYSTERECTOMY    . BACK SURGERY    . BREAST BIOPSY Right 2012   positive  . BREAST BIOPSY Right 2014   positive  . BREAST BIOPSY Right 06/26/2016   US guided - waiting for pathology  . BREAST EXCISIONAL BIOPSY Right 1995   benign  . CARPAL TUNNEL RELEASE Left   . DILATION AND CURETTAGE OF UTERUS     S/P miscarriage  . HERNIA REPAIR    . IR GENERIC HISTORICAL  04/04/2016   IR LUMBAR DISC ASPIRATION W/IMG GUIDE 04/04/2016 [name], MD [facility]  . JOINT REPLACEMENT    . LAPAROSCOPIC CHOLECYSTECTOMY  2016  .  PORTACATH PLACEMENT Left 09/13/2019   Procedure: INSERTION PORT-A-CATH;  Surgeon: Robert Bellow, MD;  Location: [facility] ORS;  Service: General;  Laterality: Left;  . REPLACEMENT TOTAL HIP W/  RESURFACING IMPLANTS Bilateral   . SKIN CANCER EXCISION     "right neck; mid forehead"  . TOTAL HIP ARTHROPLASTY Right 10/03/2015   Procedure: TOTAL HIP ARTHROPLASTY ANTERIOR APPROACH;  Surgeon: Hessie Knows, MD;  Location: [facility] ORS;  Service: Orthopedics;  Laterality: Right;  .  TOTAL HIP ARTHROPLASTY Left 07/25/2016   Procedure: TOTAL HIP ARTHROPLASTY ANTERIOR APPROACH;  Surgeon: Hessie Knows, MD;  Location: [facility] ORS;  Service: Orthopedics;  Laterality: Left;  . TUBAL LIGATION    . TUMOR EXCISION  2012   "small intestine; came from the breast cancer"    FAMILY HISTORY: Reviewed and unchanged. No reported history of breast cancer or other chronic diseases.     ADVANCED DIRECTIVES:    HEALTH MAINTENANCE: Social History   Tobacco Use  . Smoking status: Current Every Day Smoker    Packs/day: 1.00    Years: 54.00    Pack years: 54.00    Types: Cigarettes  . Smokeless tobacco: Never Used  . Tobacco comment: not ready to quit  Substance Use Topics  . Alcohol use: No  . Drug use: No     Colonoscopy:  PAP:  Bone density:  Lipid panel:  Allergies  Allergen Reactions  . Carafate [Sucralfate] Nausea Only  . Diclofenac Sodium Nausea Only  . Erythromycin Diarrhea and Nausea And Vomiting  . Adhesive [Tape] Itching and Rash  . Wellbutrin [Bupropion] Itching and Rash    Current Outpatient Medications  Medication Sig Dispense Refill  . acetaminophen (TYLENOL) 500 MG tablet Take 1,000 mg by mouth daily as needed for moderate pain or headache.     . citalopram (CELEXA) 20 MG tablet Take 20 mg by mouth every morning.     Marland Kitchen levothyroxine (SYNTHROID, LEVOTHROID) 100 MCG tablet Take 100 mcg by mouth daily before breakfast.     . montelukast (SINGULAIR) 10 MG tablet Take 10 mg by mouth at bedtime.     . Multiple Vitamins-Minerals (CENTRUM SILVER PO) Take 1 tablet by mouth every morning.    Marland Kitchen omeprazole (PRILOSEC) 20 MG capsule Take 20 mg by mouth every morning.     . ondansetron (ZOFRAN) 8 MG tablet Take 1 tablet (8 mg total) by mouth 2 (two) times daily as needed (Nausea or vomiting). 30 tablet 1  . pravastatin (PRAVACHOL) 40 MG tablet Take 40 mg by mouth at bedtime.     . prochlorperazine (COMPAZINE) 10 MG tablet Take 1 tablet (10 mg total) by mouth every 6  (six) hours as needed (Nausea or vomiting). 30 tablet 1  . valsartan-hydrochlorothiazide (DIOVAN-HCT) 80-12.5 MG tablet Take 1 tablet by mouth daily.    Grant Ruts INHUB 100-50 MCG/DOSE AEPB Inhale 1 puff into the lungs daily.     Marland Kitchen zolpidem (AMBIEN) 10 MG tablet TAKE 1 TABLET BY MOUTH AT BEDTIME AS NEEDED FOR SLEEP (Patient taking differently: Take 10 mg by mouth at bedtime. ) 30 tablet 2   No current facility-administered medications for this visit.    OBJECTIVE: There were no vitals filed for this visit.   There is no height or weight on file to calculate BMI.    ECOG FS:0 - Asymptomatic  General: Well-developed, well-nourished, no acute distress. Eyes: Pink conjunctiva, anicteric sclera. HEENT: Normocephalic, moist mucous membranes. Lungs: No audible wheezing or coughing. Heart: Regular rate and rhythm.  Abdomen: Soft, nontender, no obvious distention. Musculoskeletal: No edema, cyanosis, or clubbing. Neuro: Alert, answering all questions appropriately. Cranial nerves grossly intact. Skin: No rashes or petechiae noted. Psych: Normal affect.   LAB RESULTS: Lab Results  Component Value Date   NA 137 09/23/2019   K 3.9 09/23/2019   CL 103 09/23/2019   CO2 24 09/23/2019   GLUCOSE 124 (H) 09/23/2019   BUN 27 (H) 09/23/2019   CREATININE 1.08 (H) 09/23/2019   CALCIUM 9.0 09/23/2019   PROT 7.0 09/23/2019   ALBUMIN 3.8 09/23/2019   AST 35 09/23/2019   ALT 50 (H) 09/23/2019   ALKPHOS 147 (H) 09/23/2019   BILITOT 0.5 09/23/2019   GFRNONAA 51 (L) 09/23/2019   GFRAA 59 (L) 09/23/2019    Lab Results  Component Value Date   WBC 4.9 09/23/2019   NEUTROABS 3.7 09/23/2019   HGB 9.4 (L) 09/23/2019   HCT 29.9 (L) 09/23/2019   MCV 103.8 (H) 09/23/2019   PLT 218 09/23/2019    STUDIES: DG Chest Port 1 View  Result Date: 09/13/2019 CLINICAL DATA:  Port-A-Cath placement EXAM: PORTABLE CHEST 1 VIEW COMPARISON:  CT 01/15/2019 FINDINGS: Interval placement of a left subclavian approach  Port-A-Cath with distal tip terminating at the level of the superior cavoatrial junction. Heart size is normal. No focal airspace consolidation, pleural effusion, or pneumothorax. Mildly heterogeneous appearance of the visualized osseous structures, including sclerotic focus in the right humeral head, compatible with known osseous metastatic disease. IMPRESSION: Interval placement of a left-sided Port-A-Cath.  No pneumothorax. Electronically Signed   By: Davina Poke D.O.   On: 09/13/2019 08:44   DG C-Arm 1-60 Min-No Report  Result Date: 09/13/2019 Fluoroscopy was utilized by the requesting physician.  No radiographic interpretation.    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  PLAN:    1.  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  PET scan results from July 19, 2019 reviewed independently with new metastatic lesions in bone.  Patient has no evidence of visceral disease.  She also has a slowly increasing CA 27-29.  After lengthy discussion, patient agreed to discontinue Ibrance and letrozole.  Patient will receive Halaven on days 1 and 8 with a 15 off.  Plan to repeat PET scan after approximately 4 cycles.  Patient last received Zometa on August 24, 2019.  Proceed with cycle 2, day 8 of Halaven today.  Return to clinic in 2 weeks for further evaluation and consideration of cycle 3, day 1. 2.  Hip/back pain: Chronic and unchanged.  Patient is status post bilateral hip replacement. 3.  Anemia: Chronic and unchanged.  Hemoglobin has trended up to 9.4. 4.  Renal insufficiency: Improved.  Patient's most recent creatinine is 1.08.  Proceed with treatment as above.  Appreciate nephrology input. 5.  Leukopenia: Resolved. 6.  Thrombocytopenia:  Resolved. 7.  Bony lesions: Will include Zometa every 4 to 6 weeks with her treatments.  8.  Elevated liver enzymes: ALT mildly elevated at 50.  Proceed with treatment as above. 9.  Venous access: Patient now has had port placement.  Patient expressed understanding and was in agreement with this plan. She also understands that She can call clinic at any time with any questions, concerns, or complaints.    Lloyd Huger,  MD   10/02/2019 10:43 AM

## 2019-10-05 ENCOUNTER — Ambulatory Visit (INDEPENDENT_AMBULATORY_CARE_PROVIDER_SITE_OTHER): Payer: Medicare Other | Admitting: Vascular Surgery

## 2019-10-06 ENCOUNTER — Other Ambulatory Visit: Payer: Self-pay | Admitting: Emergency Medicine

## 2019-10-06 DIAGNOSIS — C801 Malignant (primary) neoplasm, unspecified: Secondary | ICD-10-CM

## 2019-10-06 DIAGNOSIS — C50911 Malignant neoplasm of unspecified site of right female breast: Secondary | ICD-10-CM

## 2019-10-06 MED ORDER — ZOLPIDEM TARTRATE 10 MG PO TABS: 10.0000 mg | ORAL_TABLET | Freq: Every day | ORAL | 3 refills | Status: DC

## 2019-10-07 ENCOUNTER — Inpatient Hospital Stay: Payer: Medicare Other

## 2019-10-07 ENCOUNTER — Inpatient Hospital Stay: Payer: Medicare Other | Admitting: Oncology

## 2019-10-07 NOTE — Progress Notes (Signed)
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  INTERVAL HISTORY: Patient returns to clinic today for further evaluation and consideration of cycle 3, day 1 of Halaven.  She is having significant back pain radiating down both legs today, but otherwise feels well.  She also has occasional abdominal pain.  She has no other neurologic complaints. She denies any recent fevers or illnesses.  She denies any chest pain, shortness of breath, cough, or hemoptysis.  She has a good appetite and denies weight loss.  She denies any nausea, vomiting, constipation, or diarrhea.  She has no melena or hematochezia.  She has no urinary complaints.  Patient offers no further specific complaints today.  REVIEW OF SYSTEMS:   Review of Systems  Constitutional: Negative.  Negative for fever, malaise/fatigue and weight loss.  Respiratory: Negative.  Negative for cough and shortness of breath.   Cardiovascular: Negative.  Negative for chest pain and leg swelling.  Gastrointestinal: Negative.  Negative for abdominal pain, blood in stool, diarrhea and melena.  Genitourinary: Negative.   Musculoskeletal: Positive for back pain. Negative for joint pain.  Skin: Negative.  Negative for rash.  Neurological: Negative.  Negative for dizziness, sensory change, focal weakness, weakness and headaches.  Psychiatric/Behavioral: Negative.  The patient is not nervous/anxious.     As per HPI. Otherwise, a complete review of systems is negative.  PAST  MEDICAL HISTORY: Past Medical History:  Diagnosis Date  . Anxiety   . Arthritis    "back" (04/03/2016)  . Breast cancer ([place]) 2012    no lumpectomy. Treating with meds- Right  . Cancer ([place])    small intestines 2012, gallbladder 2016  . Chronic kidney disease   . Chronic lower back pain   . COPD (chronic obstructive pulmonary disease) ([place])    "use inhalers for COPD that dx'd at one time" (04/03/2016)  . Dyspnea   . Family history of adverse reaction to anesthesia    daughter gets nauseated  . GERD (gastroesophageal reflux disease)   . High cholesterol   . Hypertension   . Hypothyroidism   . Skin cancer    "right neck; mid forehead"    PAST SURGICAL HISTORY: Past Surgical History:  Procedure Laterality Date  . ABDOMINAL HERNIA REPAIR     "after they took tumor out of my colon I developed a hernia"  . ABDOMINAL HYSTERECTOMY    . BACK SURGERY    . BREAST BIOPSY Right 2012   positive  . BREAST BIOPSY Right 2014   positive  . BREAST BIOPSY Right 06/26/2016   US guided - waiting for pathology  . BREAST EXCISIONAL BIOPSY Right 1995   benign  . CARPAL TUNNEL RELEASE Left   . DILATION AND CURETTAGE OF UTERUS     S/P miscarriage  . HERNIA REPAIR    . IR GENERIC HISTORICAL  04/04/2016   IR LUMBAR DISC ASPIRATION W/IMG GUIDE 04/04/2016 [name], MD [facility]  . JOINT REPLACEMENT    . LAPAROSCOPIC CHOLECYSTECTOMY  2016  . PORTACATH PLACEMENT Left 09/13/2019   Procedure: INSERTION PORT-A-CATH;  Surgeon: Robert Bellow,  MD;  Location: [facility] ORS;  Service: General;  Laterality: Left;  . REPLACEMENT TOTAL HIP W/  RESURFACING IMPLANTS Bilateral   . SKIN CANCER EXCISION     "right neck; mid forehead"  . TOTAL HIP ARTHROPLASTY Right 10/03/2015   Procedure: TOTAL HIP ARTHROPLASTY ANTERIOR APPROACH;  Surgeon: Hessie Knows, MD;  Location: [facility] ORS;  Service: Orthopedics;  Laterality: Right;  . TOTAL HIP ARTHROPLASTY Left 07/25/2016   Procedure: TOTAL HIP ARTHROPLASTY  ANTERIOR APPROACH;  Surgeon: Hessie Knows, MD;  Location: [facility] OR[facility];  Service: Orthopedics;  Laterality: Left;  . TUBAL LIGATION    . TUMOR EXCISION  2012   "small intestine; came from the breast cancer"    FAMILY HISTORY: Reviewed and unchanged. No reported history of breast cancer or other chronic diseases.     ADVANCED DIRECTIVES:    HEALTH MAINTENANCE: Social History   Tobacco Use  . Smoking status: Current Every Day Smoker    Packs/day: 1.00    Years: 54.00    Pack years: 54.00    Types: Cigarettes  . Smokeless tobacco: Never Used  . Tobacco comment: not ready to quit  Substance Use Topics  . Alcohol use: No  . Drug use: No     Colonoscopy:  PAP:  Bone density:  Lipid panel:  Allergies  Allergen Reactions  . Carafate [Sucralfate] Nausea Only  . Diclofenac Sodium Nausea Only  . Erythromycin Diarrhea and Nausea And Vomiting  . Adhesive [Tape] Itching and Rash  . Wellbutrin [Bupropion] Itching and Rash    Current Outpatient Medications  Medication Sig Dispense Refill  . acetaminophen (TYLENOL) 500 MG tablet Take 1,000 mg by mouth daily as needed for moderate pain or headache.     . citalopram (CELEXA) 20 MG tablet Take 20 mg by mouth every morning.     Marland Kitchen levothyroxine (SYNTHROID, LEVOTHROID) 100 MCG tablet Take 100 mcg by mouth daily before breakfast.     . montelukast (SINGULAIR) 10 MG tablet Take 10 mg by mouth at bedtime.     . Multiple Vitamins-Minerals (CENTRUM SILVER PO) Take 1 tablet by mouth every morning.    Marland Kitchen omeprazole (PRILOSEC) 20 MG capsule Take 20 mg by mouth every morning.     . ondansetron (ZOFRAN) 8 MG tablet Take 1 tablet (8 mg total) by mouth 2 (two) times daily as needed (Nausea or vomiting). 30 tablet 1  . pravastatin (PRAVACHOL) 40 MG tablet Take 40 mg by mouth at bedtime.     . prochlorperazine (COMPAZINE) 10 MG tablet Take 1 tablet (10 mg total) by mouth every 6 (six) hours as needed (Nausea or vomiting). 30 tablet 1  .  valsartan-hydrochlorothiazide (DIOVAN-HCT) 80-12.5 MG tablet Take 1 tablet by mouth daily.    Grant Ruts INHUB 100-50 MCG/DOSE AEPB Inhale 1 puff into the lungs daily.     Marland Kitchen zolpidem (AMBIEN) 10 MG tablet Take 1 tablet (10 mg total) by mouth at bedtime. 30 tablet 2   No current facility-administered medications for this visit.    OBJECTIVE: Vitals:   10/14/19 0857  BP: 113/83  Pulse: 96  Temp: 98.4 F (36.9 C)     Body mass index is 25.51 kg/m.    ECOG FS:0 - Asymptomatic  General: Well-developed, well-nourished, no acute distress. Eyes: Pink conjunctiva, anicteric sclera. HEENT: Normocephalic, moist mucous membranes. Lungs: No audible wheezing or coughing. Heart: Regular rate and rhythm. Abdomen: Soft, nontender, no obvious distention. Musculoskeletal: No edema, cyanosis, or clubbing. Neuro: Alert, answering all questions appropriately. Cranial nerves  grossly intact. Skin: No rashes or petechiae noted. Psych: Normal affect.   LAB RESULTS: Lab Results  Component Value Date   NA 138 10/14/2019   K 4.3 10/14/2019   CL 104 10/14/2019   CO2 24 10/14/2019   GLUCOSE 120 (H) 10/14/2019   BUN 36 (H) 10/14/2019   CREATININE 1.34 (H) 10/14/2019   CALCIUM 9.0 10/14/2019   PROT 7.4 10/14/2019   ALBUMIN 3.7 10/14/2019   AST 30 10/14/2019   ALT 17 10/14/2019   ALKPHOS 168 (H) 10/14/2019   BILITOT 0.4 10/14/2019   GFRNONAA 39 (L) 10/14/2019   GFRAA 45 (L) 10/14/2019    Lab Results  Component Value Date   WBC 6.3 10/14/2019   NEUTROABS 4.7 10/14/2019   HGB 8.6 (L) 10/14/2019   HCT 27.4 (L) 10/14/2019   MCV 101.9 (H) 10/14/2019   PLT 217 10/14/2019    STUDIES: No results found.  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  PLAN:    1.  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  PET scan results from July 19, 2019 reviewed independently with new metastatic lesions in bone.  Patient has no evidence of visceral disease.  She also had a slowly increasing CA 27-29.  After lengthy discussion, patient agreed to discontinue Ibrance and letrozole.  Patient will receive Halaven on days 1 and 8 with a 15 off.  Plan to repeat PET scan after approximately 4 cycles.  Patient last received Zometa on September 23, 2019.  Proceed with cycle 3, day 1 of Halaven today.  Return to clinic in 1 week for further evaluation and consideration of cycle 3, day 8.   2.  Hip/back pain: Worse today.  Unrelated to malignancy.  Follow-up with orthopedics as indicated. 3.  Anemia: Hemoglobin has trended down slightly to 8.6, monitor. 4.  Renal insufficiency: Creatinine slightly worse today at 1.34.  Proceed with treatment as above.  Appreciate nephrology input. 5.  Leukopenia: Resolved. 6.  Thrombocytopenia: Resolved. 7.  Bony lesions: Will include Zometa every 4 to 6 weeks with her treatments.  8.  Elevated liver enzymes: Resolved. 9.  Venous access: Patient now has had port placement. 10.  Abdominal pain: Imaging as above revealed no obvious pathology.  Possibly related to adhesions from patient's multiple abdominal surgeries.  Patient expressed understanding and was in agreement with this plan. She also understands that She can call clinic at any time with any questions, concerns, or complaints.    Lloyd Huger, MD   10/15/2019 9:53 AM

## 2019-10-08 ENCOUNTER — Other Ambulatory Visit: Payer: Self-pay | Admitting: *Deleted

## 2019-10-08 DIAGNOSIS — C50911 Malignant neoplasm of unspecified site of right female breast: Secondary | ICD-10-CM

## 2019-10-08 DIAGNOSIS — C801 Malignant (primary) neoplasm, unspecified: Secondary | ICD-10-CM

## 2019-10-08 MED ORDER — ZOLPIDEM TARTRATE 10 MG PO TABS: 10.0000 mg | ORAL_TABLET | Freq: Every day | ORAL | 2 refills | Status: DC

## 2019-10-08 NOTE — Telephone Encounter (Addendum)
[facility] never got the Ambien prescription ordered 2/17 because it printed.

## 2019-10-14 ENCOUNTER — Inpatient Hospital Stay: Payer: Medicare Other

## 2019-10-14 ENCOUNTER — Ambulatory Visit: Payer: Medicare Other | Admitting: Oncology

## 2019-10-14 ENCOUNTER — Other Ambulatory Visit: Payer: Medicare Other

## 2019-10-14 ENCOUNTER — Inpatient Hospital Stay (HOSPITAL_BASED_OUTPATIENT_CLINIC_OR_DEPARTMENT_OTHER): Payer: Medicare Other | Admitting: Oncology

## 2019-10-14 ENCOUNTER — Other Ambulatory Visit: Payer: Self-pay

## 2019-10-14 VITALS — BP 113/83 | HR 96 | Temp 98.4°F | Wt 148.6 lb

## 2019-10-14 DIAGNOSIS — C50911 Malignant neoplasm of unspecified site of right female breast: Secondary | ICD-10-CM

## 2019-10-14 DIAGNOSIS — C50919 Malignant neoplasm of unspecified site of unspecified female breast: Secondary | ICD-10-CM

## 2019-10-14 DIAGNOSIS — Z5111 Encounter for antineoplastic chemotherapy: Secondary | ICD-10-CM | POA: Diagnosis not present

## 2019-10-14 LAB — CBC WITH DIFFERENTIAL/PLATELET
Abs Immature Granulocytes: 0.06 10*3/uL (ref 0.00–0.07)
Basophils Absolute: 0.1 10*3/uL (ref 0.0–0.1)
Basophils Relative: 1 %
Eosinophils Absolute: 0.1 10*3/uL (ref 0.0–0.5)
Eosinophils Relative: 1 %
HCT: 27.4 % — ABNORMAL LOW (ref 36.0–46.0)
Hemoglobin: 8.6 g/dL — ABNORMAL LOW (ref 12.0–15.0)
Immature Granulocytes: 1 %
Lymphocytes Relative: 13 %
Lymphs Abs: 0.8 10*3/uL (ref 0.7–4.0)
MCH: 32 pg (ref 26.0–34.0)
MCHC: 31.4 g/dL (ref 30.0–36.0)
MCV: 101.9 fL — ABNORMAL HIGH (ref 80.0–100.0)
Monocytes Absolute: 0.6 10*3/uL (ref 0.1–1.0)
Monocytes Relative: 9 %
Neutro Abs: 4.7 10*3/uL (ref 1.7–7.7)
Neutrophils Relative %: 75 %
Platelets: 217 10*3/uL (ref 150–400)
RBC: 2.69 MIL/uL — ABNORMAL LOW (ref 3.87–5.11)
RDW: 14.9 % (ref 11.5–15.5)
WBC: 6.3 10*3/uL (ref 4.0–10.5)
nRBC: 0 % (ref 0.0–0.2)

## 2019-10-14 LAB — COMPREHENSIVE METABOLIC PANEL
ALT: 17 U/L (ref 0–44)
AST: 30 U/L (ref 15–41)
Albumin: 3.7 g/dL (ref 3.5–5.0)
Alkaline Phosphatase: 168 U/L — ABNORMAL HIGH (ref 38–126)
Anion gap: 10 (ref 5–15)
BUN: 36 mg/dL — ABNORMAL HIGH (ref 8–23)
CO2: 24 mmol/L (ref 22–32)
Calcium: 9 mg/dL (ref 8.9–10.3)
Chloride: 104 mmol/L (ref 98–111)
Creatinine, Ser: 1.34 mg/dL — ABNORMAL HIGH (ref 0.44–1.00)
GFR calc Af Amer: 45 mL/min — ABNORMAL LOW (ref >60)
GFR calc non Af Amer: 39 mL/min — ABNORMAL LOW (ref >60)
Glucose, Bld: 120 mg/dL — ABNORMAL HIGH (ref 70–99)
Potassium: 4.3 mmol/L (ref 3.5–5.1)
Sodium: 138 mmol/L (ref 135–145)
Total Bilirubin: 0.4 mg/dL (ref 0.3–1.2)
Total Protein: 7.4 g/dL (ref 6.5–8.1)

## 2019-10-14 MED ORDER — SODIUM CHLORIDE 0.9 % IV SOLN
1.4000 mg/m2 | Freq: Once | INTRAVENOUS | Status: AC
  Administered 2019-10-14: 10:00:00 2.4 mg via INTRAVENOUS
  Filled 2019-10-14: qty 4.8

## 2019-10-14 MED ORDER — HEPARIN SOD (PORK) LOCK FLUSH 100 UNIT/ML IV SOLN
500.0000 [IU] | Freq: Once | INTRAVENOUS | Status: AC | PRN
  Administered 2019-10-14: 11:00:00 500 [IU]
  Filled 2019-10-14: qty 5

## 2019-10-14 MED ORDER — PROCHLORPERAZINE MALEATE 10 MG PO TABS
10.0000 mg | ORAL_TABLET | Freq: Once | ORAL | Status: AC
  Administered 2019-10-14: 10:00:00 10 mg via ORAL
  Filled 2019-10-14: qty 1

## 2019-10-14 MED ORDER — SODIUM CHLORIDE 0.9% FLUSH
10.0000 mL | Freq: Once | INTRAVENOUS | Status: AC
  Administered 2019-10-14: 10 mL via INTRAVENOUS
  Filled 2019-10-14: qty 10

## 2019-10-14 MED ORDER — HEPARIN SOD (PORK) LOCK FLUSH 100 UNIT/ML IV SOLN
INTRAVENOUS | Status: AC
  Filled 2019-10-14: qty 5

## 2019-10-14 MED ORDER — HEPARIN SOD (PORK) LOCK FLUSH 100 UNIT/ML IV SOLN
500.0000 [IU] | Freq: Once | INTRAVENOUS | Status: AC
  Filled 2019-10-14: qty 5

## 2019-10-14 MED ORDER — SODIUM CHLORIDE 0.9 % IV SOLN
Freq: Once | INTRAVENOUS | Status: AC
  Filled 2019-10-14: qty 250

## 2019-10-15 LAB — CANCER ANTIGEN 27.29: CA 27.29: 59 U/mL — ABNORMAL HIGH (ref 0.0–38.6)

## 2019-10-16 NOTE — Progress Notes (Signed)
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  INTERVAL HISTORY: Patient returns to clinic today for further evaluation and consideration of cycle 3, day 8 of Halaven.  Her back pain is better controlled today with the use of tramadol.  She continues to have intermittent abdominal pain as well.  She has no neurologic complaints.  She denies any recent fevers or illnesses.  She denies any chest pain, shortness of breath, cough, or hemoptysis.  She has a good appetite and denies weight loss.  She denies any nausea, vomiting, constipation, or diarrhea.  She has no melena or hematochezia.  She has no urinary complaints.  Patient offers no further specific complaints today.  REVIEW OF SYSTEMS:   Review of Systems  Constitutional: Negative.  Negative for fever, malaise/fatigue and weight loss.  Respiratory: Negative.  Negative for cough and shortness of breath.   Cardiovascular: Negative.  Negative for chest pain and leg swelling.  Gastrointestinal: Negative.  Negative for abdominal pain, blood in stool, diarrhea and melena.  Genitourinary: Negative.   Musculoskeletal: Positive for back pain. Negative for joint pain.  Skin: Negative.  Negative for rash.  Neurological: Negative.  Negative for dizziness, sensory change, focal weakness, weakness and headaches.  Psychiatric/Behavioral: Negative.  The patient is not nervous/anxious.     As per HPI. Otherwise, a complete review of systems is negative.  PAST MEDICAL  HISTORY: Past Medical History:  Diagnosis Date  . Anxiety   . Arthritis    "back" (04/03/2016)  . Breast cancer ([place]) 2012    no lumpectomy. Treating with meds- Right  . Cancer ([place])    small intestines 2012, gallbladder 2016  . Chronic kidney disease   . Chronic lower back pain   . COPD (chronic obstructive pulmonary disease) ([place])    "use inhalers for COPD that dx'd at one time" (04/03/2016)  . Dyspnea   . Family history of adverse reaction to anesthesia    daughter gets nauseated  . GERD (gastroesophageal reflux disease)   . High cholesterol   . Hypertension   . Hypothyroidism   . Skin cancer    "right neck; mid forehead"    PAST SURGICAL HISTORY: Past Surgical History:  Procedure Laterality Date  . ABDOMINAL HERNIA REPAIR     "after they took tumor out of my colon I developed a hernia"  . ABDOMINAL HYSTERECTOMY    . BACK SURGERY    . BREAST BIOPSY Right 2012   positive  . BREAST BIOPSY Right 2014   positive  . BREAST BIOPSY Right 06/26/2016   US guided - waiting for pathology  . BREAST EXCISIONAL BIOPSY Right 1995   benign  . CARPAL TUNNEL RELEASE Left   . DILATION AND CURETTAGE OF UTERUS     S/P miscarriage  . HERNIA REPAIR    . IR GENERIC HISTORICAL  04/04/2016   IR LUMBAR DISC ASPIRATION W/IMG GUIDE 04/04/2016 [name], MD [facility]  . JOINT REPLACEMENT    . LAPAROSCOPIC CHOLECYSTECTOMY  2016  . PORTACATH PLACEMENT Left 09/13/2019   Procedure: INSERTION PORT-A-CATH;  Surgeon: Robert Bellow,  MD;  Location: [facility] ORS;  Service: General;  Laterality: Left;  . REPLACEMENT TOTAL HIP W/  RESURFACING IMPLANTS Bilateral   . SKIN CANCER EXCISION     "right neck; mid forehead"  . TOTAL HIP ARTHROPLASTY Right 10/03/2015   Procedure: TOTAL HIP ARTHROPLASTY ANTERIOR APPROACH;  Surgeon: Hessie Knows, MD;  Location: [facility] ORS;  Service: Orthopedics;  Laterality: Right;  . TOTAL HIP ARTHROPLASTY Left 07/25/2016   Procedure: TOTAL HIP ARTHROPLASTY ANTERIOR  APPROACH;  Surgeon: Hessie Knows, MD;  Location: [facility] ORS;  Service: Orthopedics;  Laterality: Left;  . TUBAL LIGATION    . TUMOR EXCISION  2012   "small intestine; came from the breast cancer"    FAMILY HISTORY: Reviewed and unchanged. No reported history of breast cancer or other chronic diseases.     ADVANCED DIRECTIVES:    HEALTH MAINTENANCE: Social History   Tobacco Use  . Smoking status: Current Every Day Smoker    Packs/day: 1.00    Years: 54.00    Pack years: 54.00    Types: Cigarettes  . Smokeless tobacco: Never Used  . Tobacco comment: not ready to quit  Substance Use Topics  . Alcohol use: No  . Drug use: No     Colonoscopy:  PAP:  Bone density:  Lipid panel:  Allergies  Allergen Reactions  . Carafate [Sucralfate] Nausea Only  . Diclofenac Sodium Nausea Only  . Erythromycin Diarrhea and Nausea And Vomiting  . Adhesive [Tape] Itching and Rash  . Wellbutrin [Bupropion] Itching and Rash    Current Outpatient Medications  Medication Sig Dispense Refill  . albuterol (VENTOLIN HFA) 108 (90 Base) MCG/ACT inhaler Inhale 2 puffs into the lungs every 4 (four) hours as needed.    Marland Kitchen acetaminophen (TYLENOL) 500 MG tablet Take 1,000 mg by mouth daily as needed for moderate pain or headache.     . citalopram (CELEXA) 20 MG tablet Take 20 mg by mouth every morning.     Marland Kitchen levothyroxine (SYNTHROID, LEVOTHROID) 100 MCG tablet Take 100 mcg by mouth daily before breakfast.     . montelukast (SINGULAIR) 10 MG tablet Take 10 mg by mouth at bedtime.     . Multiple Vitamins-Minerals (CENTRUM SILVER PO) Take 1 tablet by mouth every morning.    Marland Kitchen omeprazole (PRILOSEC) 20 MG capsule Take 20 mg by mouth every morning.     . ondansetron (ZOFRAN) 8 MG tablet Take 1 tablet (8 mg total) by mouth 2 (two) times daily as needed (Nausea or vomiting). 30 tablet 1  . pravastatin (PRAVACHOL) 40 MG tablet Take 40 mg by mouth at bedtime.     . prochlorperazine (COMPAZINE) 10 MG tablet Take 1  tablet (10 mg total) by mouth every 6 (six) hours as needed (Nausea or vomiting). 30 tablet 1  . traMADol (ULTRAM) 50 MG tablet Take 1 tablet (50 mg total) by mouth every 6 (six) hours as needed. 60 tablet 1  . valsartan-hydrochlorothiazide (DIOVAN-HCT) 80-12.5 MG tablet Take 1 tablet by mouth daily.    Grant Ruts INHUB 100-50 MCG/DOSE AEPB Inhale 1 puff into the lungs daily.     Marland Kitchen zolpidem (AMBIEN) 10 MG tablet Take 1 tablet (10 mg total) by mouth at bedtime. 30 tablet 2   No current facility-administered medications for this visit.    OBJECTIVE: Vitals:   10/21/19 1121  BP: (!) 104/47  Pulse: 72  Temp: (!) 97.4 F (36.3 C)     Body mass index is 25.35 kg/m.    ECOG FS:0 -  Asymptomatic  General: Well-developed, well-nourished, no acute distress. Eyes: Pink conjunctiva, anicteric sclera. HEENT: Normocephalic, moist mucous membranes. Lungs: No audible wheezing or coughing. Heart: Regular rate and rhythm. Abdomen: Soft, nontender, no obvious distention. Musculoskeletal: No edema, cyanosis, or clubbing. Neuro: Alert, answering all questions appropriately. Cranial nerves grossly intact. Skin: No rashes or petechiae noted. Psych: Normal affect.   LAB RESULTS: Lab Results  Component Value Date   NA 135 10/21/2019   K 4.1 10/21/2019   CL 101 10/21/2019   CO2 23 10/21/2019   GLUCOSE 107 (H) 10/21/2019   BUN 30 (H) 10/21/2019   CREATININE 1.23 (H) 10/21/2019   CALCIUM 9.2 10/21/2019   PROT 7.2 10/21/2019   ALBUMIN 3.8 10/21/2019   AST 36 10/21/2019   ALT 27 10/21/2019   ALKPHOS 142 (H) 10/21/2019   BILITOT 0.3 10/21/2019   GFRNONAA 43 (L) 10/21/2019   GFRAA 50 (L) 10/21/2019    Lab Results  Component Value Date   WBC 4.2 10/21/2019   NEUTROABS 3.0 10/21/2019   HGB 8.6 (L) 10/21/2019   HCT 27.4 (L) 10/21/2019   MCV 98.9 10/21/2019   PLT 238 10/21/2019    STUDIES: No results found.  ASSESSMENT: Stage IV lobular upper outer right breast cancer with metastatic  lesions in small bowel, gall bladder, and bone.  PLAN:    1.  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  PET scan results from July 19, 2019 reviewed independently with new metastatic lesions in bone.  Patient has no evidence of visceral disease.  She also had a slowly increasing CA 27-29.  After lengthy discussion, patient agreed to discontinue Ibrance and letrozole.  Patient will receive Halaven on days 1 and 8 with a 15 off.  Plan to repeat PET scan after approximately 4 cycles.  Patient will receive Zometa today.  Proceed with cycle 3, day 8 of Halaven.  Return to clinic in 2 weeks for further evaluation and consideration of cycle 4, day 1.   2.  Hip/back pain: Improved.  Unrelated to malignancy.  Follow-up with orthopedics as indicated.  Patient was given a refill of her tramadol today. 3.  Anemia: Chronic and unchanged.  Hemoglobin stable at 8.6. 4.  Renal insufficiency: Improved.  Creatinine is 1.23 today.  Proceed with treatment as above.  Appreciate nephrology input. 5.  Leukopenia: Resolved. 6.  Thrombocytopenia: Resolved. 7.  Bony lesions: Will include Zometa every 4 to 6 weeks with her treatments.  8.  Elevated liver enzymes: Resolved. 9.  Venous access: Patient now has had port placement. 10.  Abdominal pain: Imaging as above revealed no obvious pathology.  Possibly related to adhesions from patient's multiple abdominal surgeries.  Patient expressed understanding and was in agreement with this plan. She also understands that She can call clinic at any time with any questions, concerns, or complaints.    Lloyd Huger, MD   10/22/2019 6:50 AM

## 2019-10-21 ENCOUNTER — Inpatient Hospital Stay: Payer: Medicare Other | Attending: Oncology

## 2019-10-21 ENCOUNTER — Encounter: Payer: Self-pay | Admitting: Oncology

## 2019-10-21 ENCOUNTER — Other Ambulatory Visit: Payer: Self-pay

## 2019-10-21 ENCOUNTER — Inpatient Hospital Stay: Payer: Medicare Other

## 2019-10-21 ENCOUNTER — Inpatient Hospital Stay (HOSPITAL_BASED_OUTPATIENT_CLINIC_OR_DEPARTMENT_OTHER): Payer: Medicare Other | Admitting: Oncology

## 2019-10-21 VITALS — Resp 18

## 2019-10-21 VITALS — BP 104/47 | HR 72 | Temp 97.4°F | Wt 147.7 lb

## 2019-10-21 DIAGNOSIS — C50911 Malignant neoplasm of unspecified site of right female breast: Secondary | ICD-10-CM

## 2019-10-21 DIAGNOSIS — Z5111 Encounter for antineoplastic chemotherapy: Secondary | ICD-10-CM | POA: Diagnosis present

## 2019-10-21 DIAGNOSIS — C784 Secondary malignant neoplasm of small intestine: Secondary | ICD-10-CM | POA: Insufficient documentation

## 2019-10-21 DIAGNOSIS — C50411 Malignant neoplasm of upper-outer quadrant of right female breast: Secondary | ICD-10-CM | POA: Diagnosis present

## 2019-10-21 DIAGNOSIS — C7951 Secondary malignant neoplasm of bone: Secondary | ICD-10-CM | POA: Diagnosis present

## 2019-10-21 DIAGNOSIS — C7989 Secondary malignant neoplasm of other specified sites: Secondary | ICD-10-CM | POA: Diagnosis not present

## 2019-10-21 DIAGNOSIS — C50919 Malignant neoplasm of unspecified site of unspecified female breast: Secondary | ICD-10-CM

## 2019-10-21 LAB — COMPREHENSIVE METABOLIC PANEL
ALT: 27 U/L (ref 0–44)
AST: 36 U/L (ref 15–41)
Albumin: 3.8 g/dL (ref 3.5–5.0)
Alkaline Phosphatase: 142 U/L — ABNORMAL HIGH (ref 38–126)
Anion gap: 11 (ref 5–15)
BUN: 30 mg/dL — ABNORMAL HIGH (ref 8–23)
CO2: 23 mmol/L (ref 22–32)
Calcium: 9.2 mg/dL (ref 8.9–10.3)
Chloride: 101 mmol/L (ref 98–111)
Creatinine, Ser: 1.23 mg/dL — ABNORMAL HIGH (ref 0.44–1.00)
GFR calc Af Amer: 50 mL/min — ABNORMAL LOW (ref >60)
GFR calc non Af Amer: 43 mL/min — ABNORMAL LOW (ref >60)
Glucose, Bld: 107 mg/dL — ABNORMAL HIGH (ref 70–99)
Potassium: 4.1 mmol/L (ref 3.5–5.1)
Sodium: 135 mmol/L (ref 135–145)
Total Bilirubin: 0.3 mg/dL (ref 0.3–1.2)
Total Protein: 7.2 g/dL (ref 6.5–8.1)

## 2019-10-21 LAB — CBC WITH DIFFERENTIAL/PLATELET
Abs Immature Granulocytes: 0.07 10*3/uL (ref 0.00–0.07)
Basophils Absolute: 0.1 10*3/uL (ref 0.0–0.1)
Basophils Relative: 1 %
Eosinophils Absolute: 0.1 10*3/uL (ref 0.0–0.5)
Eosinophils Relative: 1 %
HCT: 27.4 % — ABNORMAL LOW (ref 36.0–46.0)
Hemoglobin: 8.6 g/dL — ABNORMAL LOW (ref 12.0–15.0)
Immature Granulocytes: 2 %
Lymphocytes Relative: 18 %
Lymphs Abs: 0.8 10*3/uL (ref 0.7–4.0)
MCH: 31 pg (ref 26.0–34.0)
MCHC: 31.4 g/dL (ref 30.0–36.0)
MCV: 98.9 fL (ref 80.0–100.0)
Monocytes Absolute: 0.3 10*3/uL (ref 0.1–1.0)
Monocytes Relative: 7 %
Neutro Abs: 3 10*3/uL (ref 1.7–7.7)
Neutrophils Relative %: 71 %
Platelets: 238 10*3/uL (ref 150–400)
RBC: 2.77 MIL/uL — ABNORMAL LOW (ref 3.87–5.11)
RDW: 15 % (ref 11.5–15.5)
WBC: 4.2 10*3/uL (ref 4.0–10.5)
nRBC: 0 % (ref 0.0–0.2)

## 2019-10-21 MED ORDER — PROCHLORPERAZINE MALEATE 10 MG PO TABS
10.0000 mg | ORAL_TABLET | Freq: Once | ORAL | Status: AC
  Administered 2019-10-21: 10 mg via ORAL
  Filled 2019-10-21: qty 1

## 2019-10-21 MED ORDER — SODIUM CHLORIDE 0.9% FLUSH
10.0000 mL | Freq: Once | INTRAVENOUS | Status: DC | PRN
  Filled 2019-10-21: qty 10

## 2019-10-21 MED ORDER — SODIUM CHLORIDE 0.9% FLUSH
3.0000 mL | Freq: Once | INTRAVENOUS | Status: DC | PRN
  Filled 2019-10-21: qty 3

## 2019-10-21 MED ORDER — TRAMADOL HCL 50 MG PO TABS: 50.0000 mg | ORAL_TABLET | Freq: Four times a day (QID) | ORAL | 1 refills | Status: DC | PRN

## 2019-10-21 MED ORDER — HEPARIN SOD (PORK) LOCK FLUSH 100 UNIT/ML IV SOLN
250.0000 [IU] | Freq: Once | INTRAVENOUS | Status: DC | PRN
  Filled 2019-10-21: qty 5

## 2019-10-21 MED ORDER — SODIUM CHLORIDE 0.9 % IV SOLN
Freq: Once | INTRAVENOUS | Status: AC
  Filled 2019-10-21: qty 250

## 2019-10-21 MED ORDER — ZOLEDRONIC ACID 4 MG/5ML IV CONC
3.3000 mg | Freq: Once | INTRAVENOUS | Status: AC
  Administered 2019-10-21: 3.3 mg via INTRAVENOUS
  Filled 2019-10-21: qty 4.13

## 2019-10-21 MED ORDER — HEPARIN SOD (PORK) LOCK FLUSH 100 UNIT/ML IV SOLN
INTRAVENOUS | Status: AC
  Filled 2019-10-21: qty 5

## 2019-10-21 MED ORDER — HEPARIN SOD (PORK) LOCK FLUSH 100 UNIT/ML IV SOLN
500.0000 [IU] | Freq: Once | INTRAVENOUS | Status: DC | PRN
  Filled 2019-10-21: qty 5

## 2019-10-21 MED ORDER — SODIUM CHLORIDE 0.9% FLUSH
10.0000 mL | INTRAVENOUS | Status: DC | PRN
  Administered 2019-10-21: 10 mL via INTRAVENOUS
  Filled 2019-10-21: qty 10

## 2019-10-21 MED ORDER — SODIUM CHLORIDE 0.9 % IV SOLN
Freq: Once | INTRAVENOUS | Status: DC
  Filled 2019-10-21: qty 250

## 2019-10-21 MED ORDER — HEPARIN SOD (PORK) LOCK FLUSH 100 UNIT/ML IV SOLN
500.0000 [IU] | Freq: Once | INTRAVENOUS | Status: AC
  Administered 2019-10-21: 500 [IU] via INTRAVENOUS
  Filled 2019-10-21: qty 5

## 2019-10-21 MED ORDER — ALTEPLASE 2 MG IJ SOLR
2.0000 mg | Freq: Once | INTRAMUSCULAR | Status: DC | PRN
  Filled 2019-10-21: qty 2

## 2019-10-21 MED ORDER — SODIUM CHLORIDE 0.9 % IV SOLN
1.4000 mg/m2 | Freq: Once | INTRAVENOUS | Status: AC
  Administered 2019-10-21: 2.4 mg via INTRAVENOUS
  Filled 2019-10-21: qty 4.8

## 2019-10-21 NOTE — Progress Notes (Signed)
Pt here for follow up. No complaints or concerns.  

## 2019-10-22 LAB — CANCER ANTIGEN 27.29: CA 27.29: 69 U/mL — ABNORMAL HIGH (ref 0.0–38.6)

## 2019-10-26 ENCOUNTER — Other Ambulatory Visit: Payer: Self-pay | Admitting: *Deleted

## 2019-10-26 MED ORDER — TRAMADOL HCL 50 MG PO TABS: 50.0000 mg | ORAL_TABLET | Freq: Four times a day (QID) | ORAL | 1 refills | Status: AC | PRN

## 2019-10-26 NOTE — Telephone Encounter (Signed)
Tramadol was sent to wrong pharmacy and needs to be sent to [place]. I called [facility] and cancel prescription there

## 2019-10-28 NOTE — Progress Notes (Signed)
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  INTERVAL HISTORY: Patient returns to clinic today for further evaluation and consideration of cycle 4, day 1 of Halaven.  She continues to have chronic back pain which is essentially unchanged.  She does not complain of abdominal pain today.  She has no neurologic complaints.  She denies any recent fevers or illnesses.  She denies any chest pain, shortness of breath, cough, or hemoptysis.  She has a good appetite and denies weight loss.  She denies any nausea, vomiting, constipation, or diarrhea.  She has no melena or hematochezia.  She has no urinary complaints.  Patient offers no further specific complaints today.  REVIEW OF SYSTEMS:   Review of Systems  Constitutional: Negative.  Negative for fever, malaise/fatigue and weight loss.  Respiratory: Negative.  Negative for cough and shortness of breath.   Cardiovascular: Negative.  Negative for chest pain and leg swelling.  Gastrointestinal: Negative.  Negative for abdominal pain, blood in stool, diarrhea and melena.  Genitourinary: Negative.   Musculoskeletal: Positive for back pain. Negative for joint pain.  Skin: Negative.  Negative for rash.  Neurological: Negative.  Negative for dizziness, sensory change, focal weakness, weakness and headaches.  Psychiatric/Behavioral: Negative.  The patient is not nervous/anxious.     As per HPI. Otherwise, a complete review of systems is negative.  PAST MEDICAL HISTORY: Past  Medical History:  Diagnosis Date  . Anxiety   . Arthritis    "back" (04/03/2016)  . Breast cancer ([place]) 2012    no lumpectomy. Treating with meds- Right  . Cancer ([place])    small intestines 2012, gallbladder 2016  . Chronic kidney disease   . Chronic lower back pain   . COPD (chronic obstructive pulmonary disease) ([place])    "use inhalers for COPD that dx'd at one time" (04/03/2016)  . Dyspnea   . Family history of adverse reaction to anesthesia    daughter gets nauseated  . GERD (gastroesophageal reflux disease)   . High cholesterol   . Hypertension   . Hypothyroidism   . Skin cancer    "right neck; mid forehead"    PAST SURGICAL HISTORY: Past Surgical History:  Procedure Laterality Date  . ABDOMINAL HERNIA REPAIR     "after they took tumor out of my colon I developed a hernia"  . ABDOMINAL HYSTERECTOMY    . BACK SURGERY    . BREAST BIOPSY Right 2012   positive  . BREAST BIOPSY Right 2014   positive  . BREAST BIOPSY Right 06/26/2016   US guided - waiting for pathology  . BREAST EXCISIONAL BIOPSY Right 1995   benign  . CARPAL TUNNEL RELEASE Left   . DILATION AND CURETTAGE OF UTERUS     S/P miscarriage  . HERNIA REPAIR    . IR GENERIC HISTORICAL  04/04/2016   IR LUMBAR DISC ASPIRATION W/IMG GUIDE 04/04/2016 [name], MD [facility]  . JOINT REPLACEMENT    . LAPAROSCOPIC CHOLECYSTECTOMY  2016  . PORTACATH PLACEMENT Left 09/13/2019   Procedure: INSERTION PORT-A-CATH;  Surgeon: Robert Bellow, MD;  Location: [facility] ORS;  Service: General;  Laterality: Left;  . REPLACEMENT TOTAL HIP W/  RESURFACING IMPLANTS Bilateral   . SKIN CANCER EXCISION     "right neck; mid forehead"  . TOTAL HIP ARTHROPLASTY Right 10/03/2015   Procedure: TOTAL HIP ARTHROPLASTY ANTERIOR APPROACH;  Surgeon: Hessie Knows, MD;  Location: [facility] ORS;  Service: Orthopedics;  Laterality: Right;  . TOTAL HIP ARTHROPLASTY Left 07/25/2016   Procedure: TOTAL HIP ARTHROPLASTY ANTERIOR APPROACH;  Surgeon:  Hessie Knows, MD;  Location: [facility] ORS;  Service: Orthopedics;  Laterality: Left;  . TUBAL LIGATION    . TUMOR EXCISION  2012   "small intestine; came from the breast cancer"    FAMILY HISTORY: Reviewed and unchanged. No reported history of breast cancer or other chronic diseases.     ADVANCED DIRECTIVES:    HEALTH MAINTENANCE: Social History   Tobacco Use  . Smoking status: Current Every Day Smoker    Packs/day: 1.00    Years: 54.00    Pack years: 54.00    Types: Cigarettes  . Smokeless tobacco: Never Used  . Tobacco comment: not ready to quit  Substance Use Topics  . Alcohol use: No  . Drug use: No     Colonoscopy:  PAP:  Bone density:  Lipid panel:  Allergies  Allergen Reactions  . Carafate [Sucralfate] Nausea Only  . Diclofenac Sodium Nausea Only  . Erythromycin Diarrhea and Nausea And Vomiting  . Adhesive [Tape] Itching and Rash  . Wellbutrin [Bupropion] Itching and Rash    Current Outpatient Medications  Medication Sig Dispense Refill  . acetaminophen (TYLENOL) 500 MG tablet Take 1,000 mg by mouth daily as needed for moderate pain or headache.     . albuterol (VENTOLIN HFA) 108 (90 Base) MCG/ACT inhaler Inhale 2 puffs into the lungs every 4 (four) hours as needed.    . citalopram (CELEXA) 20 MG tablet Take 20 mg by mouth every morning.     Marland Kitchen levothyroxine (SYNTHROID, LEVOTHROID) 100 MCG tablet Take 100 mcg by mouth daily before breakfast.     . montelukast (SINGULAIR) 10 MG tablet Take 10 mg by mouth at bedtime.     . Multiple Vitamins-Minerals (CENTRUM SILVER PO) Take 1 tablet by mouth every morning.    Marland Kitchen omeprazole (PRILOSEC) 20 MG capsule Take 20 mg by mouth every morning.     . ondansetron (ZOFRAN) 8 MG tablet Take 1 tablet (8 mg total) by mouth 2 (two) times daily as needed (Nausea or vomiting). 30 tablet 1  . pravastatin (PRAVACHOL) 40 MG tablet Take 40 mg by mouth at bedtime.     . prochlorperazine (COMPAZINE) 10 MG tablet Take 1 tablet (10 mg total)  by mouth every 6 (six) hours as needed (Nausea or vomiting). 30 tablet 1  . traMADol (ULTRAM) 50 MG tablet Take 1 tablet (50 mg total) by mouth every 6 (six) hours as needed. 60 tablet 1  . valsartan-hydrochlorothiazide (DIOVAN-HCT) 80-12.5 MG tablet Take 1 tablet by mouth daily.    Grant Ruts INHUB 100-50 MCG/DOSE AEPB Inhale 1 puff into the lungs daily.     Marland Kitchen zolpidem (AMBIEN) 10 MG tablet Take 1 tablet (10 mg total) by mouth at bedtime. 30 tablet 2   No current facility-administered medications for this visit.   Facility-Administered Medications Ordered in Other Visits  Medication Dose Route Frequency Provider Last Rate Last Admin  . eriBULin mesylate (HALAVEN) 2.4 mg in sodium chloride 0.9 % 100 mL chemo infusion  1.4 mg/m2 (Order-Specific) Intravenous Once  Lloyd Huger, MD      . heparin lock flush 100 unit/mL  500 Units Intracatheter Once PRN Lloyd Huger, MD        OBJECTIVE: Vitals:   11/04/19 0935  BP: (!) 144/57  Pulse: 75  Temp: (!) 96.9 F (36.1 C)     Body mass index is 25.73 kg/m.    ECOG FS:0 - Asymptomatic  General: Well-developed, well-nourished, no acute distress. Eyes: Pink conjunctiva, anicteric sclera. HEENT: Normocephalic, moist mucous membranes. Lungs: No audible wheezing or coughing. Heart: Regular rate and rhythm. Abdomen: Soft, nontender, no obvious distention. Musculoskeletal: No edema, cyanosis, or clubbing. Neuro: Alert, answering all questions appropriately. Cranial nerves grossly intact. Skin: No rashes or petechiae noted. Psych: Normal affect.   LAB RESULTS: Lab Results  Component Value Date   NA 137 11/04/2019   K 4.2 11/04/2019   CL 105 11/04/2019   CO2 23 11/04/2019   GLUCOSE 100 (H) 11/04/2019   BUN 26 (H) 11/04/2019   CREATININE 1.15 (H) 11/04/2019   CALCIUM 8.7 (L) 11/04/2019   PROT 7.0 11/04/2019   ALBUMIN 3.9 11/04/2019   AST 36 11/04/2019   ALT 28 11/04/2019   ALKPHOS 127 (H) 11/04/2019   BILITOT 0.5 11/04/2019     GFRNONAA 47 (L) 11/04/2019   GFRAA 55 (L) 11/04/2019    Lab Results  Component Value Date   WBC 4.5 11/04/2019   NEUTROABS 3.1 11/04/2019   HGB 8.7 (L) 11/04/2019   HCT 27.2 (L) 11/04/2019   MCV 97.8 11/04/2019   PLT 186 11/04/2019    STUDIES: No results found.  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  PLAN:    1.  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  PET scan results from July 19, 2019 reviewed independently with new metastatic lesions in bone.  Patient has no evidence of visceral disease.  She also had a slowly increasing CA 27-29.  After lengthy discussion, patient agreed to discontinue Ibrance and letrozole.  Patient will receive Halaven on days 1 and 8 with a 15 off.  Plan to repeat PET scan after 4 cycles.  Patient last received Zometa on October 21, 2019.  Proceed with cycle 4, day 1 of Halaven today.  Return to clinic in 1 week for further evaluation and consideration of cycle 4, day 8.   2.  Hip/back pain: Improved.  Unrelated to malignancy.  Follow-up with orthopedics as indicated.  Continue tramadol as needed. 3.  Anemia: Chronic and unchanged.  Hemoglobin 8.7 today. 4.  Renal insufficiency: Creatinine improved to 1.15 today.  Proceed with treatment as above.  Appreciate nephrology input. 5.  Leukopenia: Resolved. 6.  Thrombocytopenia: Resolved. 7.  Bony lesions: Zometa as above. 8.  Elevated liver enzymes: Resolved. 9.  Venous access: Patient now has had port placement. 10.  Abdominal pain: Patient does not complain of this today.  Imaging as above revealed no obvious pathology.  Possibly related to adhesions from patient's multiple abdominal surgeries.  Patient expressed understanding and was in agreement with  this plan. She also understands that She can call clinic at any time with any questions, concerns, or complaints.    Lloyd Huger, MD   11/04/2019 10:09 AM

## 2019-11-03 ENCOUNTER — Encounter: Payer: Self-pay | Admitting: Oncology

## 2019-11-04 ENCOUNTER — Inpatient Hospital Stay: Payer: Medicare Other

## 2019-11-04 ENCOUNTER — Other Ambulatory Visit: Payer: Self-pay

## 2019-11-04 ENCOUNTER — Inpatient Hospital Stay (HOSPITAL_BASED_OUTPATIENT_CLINIC_OR_DEPARTMENT_OTHER): Payer: Medicare Other | Admitting: Oncology

## 2019-11-04 ENCOUNTER — Encounter: Payer: Self-pay | Admitting: Oncology

## 2019-11-04 VITALS — BP 144/57 | HR 75 | Temp 96.9°F | Wt 149.9 lb

## 2019-11-04 DIAGNOSIS — C50919 Malignant neoplasm of unspecified site of unspecified female breast: Secondary | ICD-10-CM

## 2019-11-04 DIAGNOSIS — C50911 Malignant neoplasm of unspecified site of right female breast: Secondary | ICD-10-CM

## 2019-11-04 DIAGNOSIS — Z5111 Encounter for antineoplastic chemotherapy: Secondary | ICD-10-CM | POA: Diagnosis not present

## 2019-11-04 DIAGNOSIS — Z95828 Presence of other vascular implants and grafts: Secondary | ICD-10-CM

## 2019-11-04 LAB — COMPREHENSIVE METABOLIC PANEL
ALT: 28 U/L (ref 0–44)
AST: 36 U/L (ref 15–41)
Albumin: 3.9 g/dL (ref 3.5–5.0)
Alkaline Phosphatase: 127 U/L — ABNORMAL HIGH (ref 38–126)
Anion gap: 9 (ref 5–15)
BUN: 26 mg/dL — ABNORMAL HIGH (ref 8–23)
CO2: 23 mmol/L (ref 22–32)
Calcium: 8.7 mg/dL — ABNORMAL LOW (ref 8.9–10.3)
Chloride: 105 mmol/L (ref 98–111)
Creatinine, Ser: 1.15 mg/dL — ABNORMAL HIGH (ref 0.44–1.00)
GFR calc Af Amer: 55 mL/min — ABNORMAL LOW (ref >60)
GFR calc non Af Amer: 47 mL/min — ABNORMAL LOW (ref >60)
Glucose, Bld: 100 mg/dL — ABNORMAL HIGH (ref 70–99)
Potassium: 4.2 mmol/L (ref 3.5–5.1)
Sodium: 137 mmol/L (ref 135–145)
Total Bilirubin: 0.5 mg/dL (ref 0.3–1.2)
Total Protein: 7 g/dL (ref 6.5–8.1)

## 2019-11-04 LAB — CBC WITH DIFFERENTIAL/PLATELET
Abs Immature Granulocytes: 0.09 10*3/uL — ABNORMAL HIGH (ref 0.00–0.07)
Basophils Absolute: 0 10*3/uL (ref 0.0–0.1)
Basophils Relative: 1 %
Eosinophils Absolute: 0 10*3/uL (ref 0.0–0.5)
Eosinophils Relative: 0 %
HCT: 27.2 % — ABNORMAL LOW (ref 36.0–46.0)
Hemoglobin: 8.7 g/dL — ABNORMAL LOW (ref 12.0–15.0)
Immature Granulocytes: 2 %
Lymphocytes Relative: 19 %
Lymphs Abs: 0.8 10*3/uL (ref 0.7–4.0)
MCH: 31.3 pg (ref 26.0–34.0)
MCHC: 32 g/dL (ref 30.0–36.0)
MCV: 97.8 fL (ref 80.0–100.0)
Monocytes Absolute: 0.5 10*3/uL (ref 0.1–1.0)
Monocytes Relative: 11 %
Neutro Abs: 3.1 10*3/uL (ref 1.7–7.7)
Neutrophils Relative %: 67 %
Platelets: 186 10*3/uL (ref 150–400)
RBC: 2.78 MIL/uL — ABNORMAL LOW (ref 3.87–5.11)
RDW: 16.1 % — ABNORMAL HIGH (ref 11.5–15.5)
WBC: 4.5 10*3/uL (ref 4.0–10.5)
nRBC: 0 % (ref 0.0–0.2)

## 2019-11-04 MED ORDER — SODIUM CHLORIDE 0.9 % IV SOLN
1.4000 mg/m2 | Freq: Once | INTRAVENOUS | Status: AC
  Administered 2019-11-04: 2.4 mg via INTRAVENOUS
  Filled 2019-11-04: qty 4.8

## 2019-11-04 MED ORDER — HEPARIN SOD (PORK) LOCK FLUSH 100 UNIT/ML IV SOLN
INTRAVENOUS | Status: AC
  Filled 2019-11-04: qty 5

## 2019-11-04 MED ORDER — SODIUM CHLORIDE 0.9% FLUSH
10.0000 mL | Freq: Once | INTRAVENOUS | Status: AC
  Administered 2019-11-04: 10 mL via INTRAVENOUS
  Filled 2019-11-04: qty 10

## 2019-11-04 MED ORDER — HEPARIN SOD (PORK) LOCK FLUSH 100 UNIT/ML IV SOLN
500.0000 [IU] | Freq: Once | INTRAVENOUS | Status: AC | PRN
  Administered 2019-11-04: 500 [IU]
  Filled 2019-11-04: qty 5

## 2019-11-04 MED ORDER — SODIUM CHLORIDE 0.9 % IV SOLN
Freq: Once | INTRAVENOUS | Status: AC
  Filled 2019-11-04: qty 250

## 2019-11-04 MED ORDER — PROCHLORPERAZINE MALEATE 10 MG PO TABS
10.0000 mg | ORAL_TABLET | Freq: Once | ORAL | Status: AC
  Administered 2019-11-04: 10:00:00 10 mg via ORAL
  Filled 2019-11-04: qty 1

## 2019-11-05 LAB — CANCER ANTIGEN 27.29: CA 27.29: 62.1 U/mL — ABNORMAL HIGH (ref 0.0–38.6)

## 2019-11-05 NOTE — Progress Notes (Signed)
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  INTERVAL HISTORY: Patient returns to clinic today for further evaluation and consideration of cycle 4, day 8 of Halaven.  She continues to have chronic back pain which is unchanged.  She has noticed worsening pain in her hands and feet accompanied by a discoloration with her fingers turning blue.  She otherwise feels well.  She does not complain of abdominal pain today.  She has no neurologic complaints.  She denies any recent fevers or illnesses.  She denies any chest pain, shortness of breath, cough, or hemoptysis.  She has a good appetite and denies weight loss.  She denies any nausea, vomiting, constipation, or diarrhea.  She has no melena or hematochezia.  She has no urinary complaints.  Patient offers no further specific complaints today.  REVIEW OF SYSTEMS:   Review of Systems  Constitutional: Negative.  Negative for fever, malaise/fatigue and weight loss.  Respiratory: Negative.  Negative for cough and shortness of breath.   Cardiovascular: Negative.  Negative for chest pain and leg swelling.  Gastrointestinal: Negative.  Negative for abdominal pain, blood in stool, diarrhea and melena.  Genitourinary: Negative.   Musculoskeletal: Positive for back pain and joint pain.  Skin: Negative.  Negative for rash.  Neurological: Negative.  Negative for dizziness, sensory change, focal weakness, weakness and headaches.  Psychiatric/Behavioral: Negative.  The patient is not  nervous/anxious.     As per HPI. Otherwise, a complete review of systems is negative.  PAST MEDICAL HISTORY: Past Medical History:  Diagnosis Date  . Anxiety   . Arthritis    "back" (04/03/2016)  . Breast cancer ([place]) 2012    no lumpectomy. Treating with meds- Right  . Cancer ([place])    small intestines 2012, gallbladder 2016  . Chronic kidney disease   . Chronic lower back pain   . COPD (chronic obstructive pulmonary disease) ([place])    "use inhalers for COPD that dx'd at one time" (04/03/2016)  . Dyspnea   . Family history of adverse reaction to anesthesia    daughter gets nauseated  . GERD (gastroesophageal reflux disease)   . High cholesterol   . Hypertension   . Hypothyroidism   . Skin cancer    "right neck; mid forehead"    PAST SURGICAL HISTORY: Past Surgical History:  Procedure Laterality Date  . ABDOMINAL HERNIA REPAIR     "after they took tumor out of my colon I developed a hernia"  . ABDOMINAL HYSTERECTOMY    . BACK SURGERY    . BREAST BIOPSY Right 2012   positive  . BREAST BIOPSY Right 2014   positive  . BREAST BIOPSY Right 06/26/2016   US guided - waiting for pathology  . BREAST EXCISIONAL BIOPSY Right 1995   benign  . CARPAL TUNNEL RELEASE Left   . DILATION AND CURETTAGE OF UTERUS     S/P miscarriage  . HERNIA REPAIR    . IR GENERIC HISTORICAL  04/04/2016   IR LUMBAR DISC ASPIRATION W/IMG GUIDE 04/04/2016 [name], MD [facility]  . JOINT REPLACEMENT    .  LAPAROSCOPIC CHOLECYSTECTOMY  2016  . PORTACATH PLACEMENT Left 09/13/2019   Procedure: INSERTION PORT-A-CATH;  Surgeon: Robert Bellow, MD;  Location: [facility] ORS;  Service: General;  Laterality: Left;  . REPLACEMENT TOTAL HIP W/  RESURFACING IMPLANTS Bilateral   . SKIN CANCER EXCISION     "right neck; mid forehead"  . TOTAL HIP ARTHROPLASTY Right 10/03/2015   Procedure: TOTAL HIP ARTHROPLASTY ANTERIOR APPROACH;  Surgeon: Hessie Knows, MD;  Location: [facility] ORS;  Service: Orthopedics;   Laterality: Right;  . TOTAL HIP ARTHROPLASTY Left 07/25/2016   Procedure: TOTAL HIP ARTHROPLASTY ANTERIOR APPROACH;  Surgeon: Hessie Knows, MD;  Location: [facility] ORS;  Service: Orthopedics;  Laterality: Left;  . TUBAL LIGATION    . TUMOR EXCISION  2012   "small intestine; came from the breast cancer"    FAMILY HISTORY: Reviewed and unchanged. No reported history of breast cancer or other chronic diseases.     ADVANCED DIRECTIVES:    HEALTH MAINTENANCE: Social History   Tobacco Use  . Smoking status: Current Every Day Smoker    Packs/day: 1.00    Years: 54.00    Pack years: 54.00    Types: Cigarettes  . Smokeless tobacco: Never Used  . Tobacco comment: not ready to quit  Substance Use Topics  . Alcohol use: No  . Drug use: No     Colonoscopy:  PAP:  Bone density:  Lipid panel:  Allergies  Allergen Reactions  . Carafate [Sucralfate] Nausea Only  . Diclofenac Sodium Nausea Only  . Erythromycin Diarrhea and Nausea And Vomiting  . Adhesive [Tape] Itching and Rash  . Wellbutrin [Bupropion] Itching and Rash    Current Outpatient Medications  Medication Sig Dispense Refill  . acetaminophen (TYLENOL) 500 MG tablet Take 1,000 mg by mouth daily as needed for moderate pain or headache.     . albuterol (VENTOLIN HFA) 108 (90 Base) MCG/ACT inhaler Inhale 2 puffs into the lungs every 4 (four) hours as needed.    . citalopram (CELEXA) 20 MG tablet Take 20 mg by mouth every morning.     Marland Kitchen ipratropium (ATROVENT) 0.06 % nasal spray     . levothyroxine (SYNTHROID, LEVOTHROID) 100 MCG tablet Take 100 mcg by mouth daily before breakfast.     . montelukast (SINGULAIR) 10 MG tablet Take 10 mg by mouth at bedtime.     . Multiple Vitamins-Minerals (CENTRUM SILVER PO) Take 1 tablet by mouth every morning.    Marland Kitchen omeprazole (PRILOSEC) 20 MG capsule Take 20 mg by mouth every morning.     . ondansetron (ZOFRAN) 8 MG tablet Take 1 tablet (8 mg total) by mouth 2 (two) times daily as needed (Nausea  or vomiting). 30 tablet 1  . pravastatin (PRAVACHOL) 40 MG tablet Take 40 mg by mouth at bedtime.     . prochlorperazine (COMPAZINE) 10 MG tablet Take 1 tablet (10 mg total) by mouth every 6 (six) hours as needed (Nausea or vomiting). 30 tablet 1  . traMADol (ULTRAM) 50 MG tablet Take 1 tablet (50 mg total) by mouth every 6 (six) hours as needed. 60 tablet 1  . valsartan-hydrochlorothiazide (DIOVAN-HCT) 80-12.5 MG tablet Take 1 tablet by mouth daily.    Grant Ruts INHUB 100-50 MCG/DOSE AEPB Inhale 1 puff into the lungs daily.     Marland Kitchen zolpidem (AMBIEN) 10 MG tablet Take 1 tablet (10 mg total) by mouth at bedtime. 30 tablet 2   No current facility-administered medications for this visit.   Facility-Administered Medications Ordered in Other  Visits  Medication Dose Route Frequency Provider Last Rate Last Admin  . heparin lock flush 100 unit/mL  500 Units Intracatheter Once PRN Lloyd Huger, MD      . sodium chloride flush (NS) 0.9 % injection 10 mL  10 mL Intravenous PRN Lloyd Huger, MD   10 mL at 11/11/19 1003    OBJECTIVE: Vitals:   11/11/19 1011  BP: (!) 132/51  Pulse: 76  Temp: (!) 96.4 F (35.8 C)     Body mass index is 25.76 kg/m.    ECOG FS:0 - Asymptomatic  General: Thin, no acute distress. Eyes: Pink conjunctiva, anicteric sclera. HEENT: Normocephalic, moist mucous membranes. Lungs: No audible wheezing or coughing. Heart: Regular rate and rhythm. Abdomen: Soft, nontender, no obvious distention. Musculoskeletal: No edema, cyanosis, or clubbing. Neuro: Alert, answering all questions appropriately. Cranial nerves grossly intact. Skin: No rashes or petechiae noted. Psych: Normal affect.   LAB RESULTS: Lab Results  Component Value Date   NA 136 11/11/2019   K 4.1 11/11/2019   CL 104 11/11/2019   CO2 23 11/11/2019   GLUCOSE 95 11/11/2019   BUN 27 (H) 11/11/2019   CREATININE 1.23 (H) 11/11/2019   CALCIUM 8.8 (L) 11/11/2019   PROT 7.3 11/11/2019   ALBUMIN 4.1  11/11/2019   AST 36 11/11/2019   ALT 28 11/11/2019   ALKPHOS 121 11/11/2019   BILITOT 0.5 11/11/2019   GFRNONAA 43 (L) 11/11/2019   GFRAA 50 (L) 11/11/2019    Lab Results  Component Value Date   WBC 6.3 11/11/2019   NEUTROABS 4.9 11/11/2019   HGB 8.9 (L) 11/11/2019   HCT 26.9 (L) 11/11/2019   MCV 95.7 11/11/2019   PLT 190 11/11/2019    STUDIES: No results found.  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  PLAN:    1.  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  PET scan results from July 19, 2019 reviewed independently with new metastatic lesions in bone.  Patient has no evidence of visceral disease.  She also had a slowly increasing CA 27-29.  After lengthy discussion, patient agreed to discontinue Ibrance and letrozole.  Patient will receive Halaven on days 1 and 8 with a 15 off.  Plan to repeat PET scan after 4 cycles.  Patient last received Zometa on October 21, 2019.  Proceed with cycle 4, day 8 of Halaven today.  Patient reports that she is moving to [place] in approximately 6 weeks, therefore will repeat PET scan in 2 weeks with video assisted telemedicine visit to discuss the results.  Is unlikely patient will receive any further chemotherapy in [place]. 2.  Hip/back pain: Improved.  Unrelated to malignancy.  Follow-up with orthopedics as indicated.  Continue tramadol as needed. 3.  Anemia: Chronic and unchanged.  Hemoglobin is 8.9 today. 4.  Renal insufficiency: Creatinine mildly increased to 1.23.  Appreciate nephrology input. 5.  Leukopenia: Resolved. 6.  Thrombocytopenia: Resolved. 7.  Bony lesions: Zometa as above. 8.  Elevated liver enzymes: Resolved. 9.  Venous access: Patient now has had port placement. 10.   Abdominal pain: Patient does not complain of this today.  Imaging as above revealed no obvious pathology.  Possibly related to adhesions from patient's multiple abdominal surgeries. 11.  Hand/foot pain: Appears to  be vascular in nature.  Patient was given a referral back to her vascular surgeon.  Patient expressed understanding and was in agreement with this plan. She also understands that She can call clinic at any time with any questions, concerns, or complaints.    Lloyd Huger, MD   11/11/2019 2:19 PM

## 2019-11-10 ENCOUNTER — Encounter: Payer: Self-pay | Admitting: Oncology

## 2019-11-10 NOTE — Progress Notes (Signed)
Patient c/o of some pain in legs. Pain comes and goes.

## 2019-11-11 ENCOUNTER — Encounter: Payer: Self-pay | Admitting: Oncology

## 2019-11-11 ENCOUNTER — Inpatient Hospital Stay: Payer: Medicare Other

## 2019-11-11 ENCOUNTER — Other Ambulatory Visit: Payer: Self-pay

## 2019-11-11 ENCOUNTER — Inpatient Hospital Stay (HOSPITAL_BASED_OUTPATIENT_CLINIC_OR_DEPARTMENT_OTHER): Payer: Medicare Other | Admitting: Oncology

## 2019-11-11 VITALS — BP 132/51 | HR 76 | Temp 96.4°F | Wt 150.1 lb

## 2019-11-11 DIAGNOSIS — C50911 Malignant neoplasm of unspecified site of right female breast: Secondary | ICD-10-CM | POA: Diagnosis not present

## 2019-11-11 DIAGNOSIS — Z5111 Encounter for antineoplastic chemotherapy: Secondary | ICD-10-CM | POA: Diagnosis not present

## 2019-11-11 LAB — CBC WITH DIFFERENTIAL/PLATELET
Abs Immature Granulocytes: 0.09 10*3/uL — ABNORMAL HIGH (ref 0.00–0.07)
Basophils Absolute: 0.1 10*3/uL (ref 0.0–0.1)
Basophils Relative: 1 %
Eosinophils Absolute: 0 10*3/uL (ref 0.0–0.5)
Eosinophils Relative: 0 %
HCT: 26.9 % — ABNORMAL LOW (ref 36.0–46.0)
Hemoglobin: 8.9 g/dL — ABNORMAL LOW (ref 12.0–15.0)
Immature Granulocytes: 1 %
Lymphocytes Relative: 14 %
Lymphs Abs: 0.9 10*3/uL (ref 0.7–4.0)
MCH: 31.7 pg (ref 26.0–34.0)
MCHC: 33.1 g/dL (ref 30.0–36.0)
MCV: 95.7 fL (ref 80.0–100.0)
Monocytes Absolute: 0.4 10*3/uL (ref 0.1–1.0)
Monocytes Relative: 6 %
Neutro Abs: 4.9 10*3/uL (ref 1.7–7.7)
Neutrophils Relative %: 78 %
Platelets: 190 10*3/uL (ref 150–400)
RBC: 2.81 MIL/uL — ABNORMAL LOW (ref 3.87–5.11)
RDW: 16.1 % — ABNORMAL HIGH (ref 11.5–15.5)
WBC: 6.3 10*3/uL (ref 4.0–10.5)
nRBC: 0 % (ref 0.0–0.2)

## 2019-11-11 LAB — COMPREHENSIVE METABOLIC PANEL
ALT: 28 U/L (ref 0–44)
AST: 36 U/L (ref 15–41)
Albumin: 4.1 g/dL (ref 3.5–5.0)
Alkaline Phosphatase: 121 U/L (ref 38–126)
Anion gap: 9 (ref 5–15)
BUN: 27 mg/dL — ABNORMAL HIGH (ref 8–23)
CO2: 23 mmol/L (ref 22–32)
Calcium: 8.8 mg/dL — ABNORMAL LOW (ref 8.9–10.3)
Chloride: 104 mmol/L (ref 98–111)
Creatinine, Ser: 1.23 mg/dL — ABNORMAL HIGH (ref 0.44–1.00)
GFR calc Af Amer: 50 mL/min — ABNORMAL LOW (ref >60)
GFR calc non Af Amer: 43 mL/min — ABNORMAL LOW (ref >60)
Glucose, Bld: 95 mg/dL (ref 70–99)
Potassium: 4.1 mmol/L (ref 3.5–5.1)
Sodium: 136 mmol/L (ref 135–145)
Total Bilirubin: 0.5 mg/dL (ref 0.3–1.2)
Total Protein: 7.3 g/dL (ref 6.5–8.1)

## 2019-11-11 MED ORDER — SODIUM CHLORIDE 0.9 % IV SOLN
Freq: Once | INTRAVENOUS | Status: AC
  Filled 2019-11-11: qty 250

## 2019-11-11 MED ORDER — HEPARIN SOD (PORK) LOCK FLUSH 100 UNIT/ML IV SOLN
500.0000 [IU] | Freq: Once | INTRAVENOUS | Status: AC
  Administered 2019-11-11: 500 [IU] via INTRAVENOUS
  Filled 2019-11-11: qty 5

## 2019-11-11 MED ORDER — HEPARIN SOD (PORK) LOCK FLUSH 100 UNIT/ML IV SOLN
500.0000 [IU] | Freq: Once | INTRAVENOUS | Status: DC | PRN
  Filled 2019-11-11: qty 5

## 2019-11-11 MED ORDER — SODIUM CHLORIDE 0.9% FLUSH
10.0000 mL | INTRAVENOUS | Status: DC | PRN
  Administered 2019-11-11: 10 mL via INTRAVENOUS
  Filled 2019-11-11: qty 10

## 2019-11-11 MED ORDER — SODIUM CHLORIDE 0.9 % IV SOLN
1.4000 mg/m2 | Freq: Once | INTRAVENOUS | Status: AC
  Administered 2019-11-11: 2.4 mg via INTRAVENOUS
  Filled 2019-11-11: qty 4.8

## 2019-11-11 MED ORDER — PROCHLORPERAZINE MALEATE 10 MG PO TABS
10.0000 mg | ORAL_TABLET | Freq: Once | ORAL | Status: AC
  Administered 2019-11-11: 10 mg via ORAL
  Filled 2019-11-11: qty 1

## 2019-11-18 ENCOUNTER — Other Ambulatory Visit: Payer: Self-pay

## 2019-11-18 ENCOUNTER — Encounter (INDEPENDENT_AMBULATORY_CARE_PROVIDER_SITE_OTHER): Payer: Self-pay | Admitting: Nurse Practitioner

## 2019-11-18 ENCOUNTER — Ambulatory Visit (INDEPENDENT_AMBULATORY_CARE_PROVIDER_SITE_OTHER): Payer: Medicare Other | Admitting: Nurse Practitioner

## 2019-11-18 VITALS — BP 109/68 | HR 90 | Resp 16 | Ht 64.0 in | Wt 148.6 lb

## 2019-11-18 DIAGNOSIS — R230 Cyanosis: Secondary | ICD-10-CM | POA: Diagnosis not present

## 2019-11-18 DIAGNOSIS — I7300 Raynaud's syndrome without gangrene: Secondary | ICD-10-CM | POA: Diagnosis not present

## 2019-11-22 ENCOUNTER — Other Ambulatory Visit (INDEPENDENT_AMBULATORY_CARE_PROVIDER_SITE_OTHER): Payer: Self-pay | Admitting: Nurse Practitioner

## 2019-11-22 DIAGNOSIS — I7300 Raynaud's syndrome without gangrene: Secondary | ICD-10-CM

## 2019-11-23 ENCOUNTER — Encounter (INDEPENDENT_AMBULATORY_CARE_PROVIDER_SITE_OTHER): Payer: Self-pay | Admitting: Nurse Practitioner

## 2019-11-23 ENCOUNTER — Other Ambulatory Visit (INDEPENDENT_AMBULATORY_CARE_PROVIDER_SITE_OTHER): Payer: Self-pay | Admitting: Nurse Practitioner

## 2019-11-23 DIAGNOSIS — M79604 Pain in right leg: Secondary | ICD-10-CM

## 2019-11-23 NOTE — Progress Notes (Signed)
Subjective:    Patient ID: Sara David, female    DOB: November 03, 1945, 74 y.o.   MRN: [id number] Chief Complaint  Patient presents with  . Follow-up    ref [place] hands turning blue    Patient presents today as referral from Dr. Grayland Ormond related to her hands turning blue.  The patient states that it is mostly her left upper extremity however within the last week her right upper extremity began to turn blue as well.  The patient states that her fingers turn blue and once she placed them into warm water and rubs them it helps to return the color.  When this happens her fingers are numb and painful.  The patient also endorses having some cramping in her lower extremities at night while she is sleeping.  She also endorses some foot cramping as well.  Within the last 2 weeks she reports having tingling, achy and heavy legs.  The patient is a current smoker.  The patient is also undergoing treatment for cancer with chemotherapy.  She denies any classic claudication-like symptoms or rest pain like symptoms.  She denies any ulcerations or wounds on her lower extremities.   Review of Systems  Cardiovascular:       Blue fingertips  Musculoskeletal:       Feet and leg cramping  Neurological:       Burning and tingling in feet  All other systems reviewed and are negative.      Objective:   Physical Exam Vitals reviewed.  Cardiovascular:     Pulses: Normal pulses.  Musculoskeletal:        General: Normal range of motion.  Skin:    Capillary Refill: Capillary refill takes less than 2 seconds.  Neurological:     Mental Status: She is alert and oriented to person, place, and time.  Psychiatric:        Mood and Affect: Mood normal.        Behavior: Behavior normal.        Thought Content: Thought content normal.        Judgment: Judgment normal.     BP 109/68 (BP Location: Right Arm)   Pulse 90   Resp 16   Ht 5\' 4"  (1.626 m)   Wt 148 lb 9.6 oz (67.4 kg)   BMI 25.51 kg/m   Past  Medical History:  Diagnosis Date  . Anxiety   . Arthritis    "back" (04/03/2016)  . Breast cancer ([place]) 2012    no lumpectomy. Treating with meds- Right  . Cancer ([place])    small intestines 2012, gallbladder 2016  . Chronic kidney disease   . Chronic lower back pain   . COPD (chronic obstructive pulmonary disease) ([place])    "use inhalers for COPD that dx'd at one time" (04/03/2016)  . Dyspnea   . Family history of adverse reaction to anesthesia    daughter gets nauseated  . GERD (gastroesophageal reflux disease)   . High cholesterol   . Hypertension   . Hypothyroidism   . Skin cancer    "right neck; mid forehead"    Social History   Socioeconomic History  . Marital status: Married    Spouse name: Not on file  . Number of children: Not on file  . Years of education: Not on file  . Highest education level: Not on file  Occupational History  . Not on file  Tobacco Use  . Smoking status: Current Every Day Smoker  Packs/day: 1.00    Years: 54.00    Pack years: 54.00    Types: Cigarettes  . Smokeless tobacco: Never Used  . Tobacco comment: not ready to quit  Substance and Sexual Activity  . Alcohol use: No  . Drug use: No  . Sexual activity: Not Currently  Other Topics Concern  . Not on file  Social History Narrative  . Not on file   Social Determinants of Health   Financial Resource Strain:   . Difficulty of Paying Living Expenses:   Food Insecurity:   . Worried About Charity fundraiser in the Last Year:   . Arboriculturist in the Last Year:   Transportation Needs:   . Film/video editor (Medical):   Marland Kitchen Lack of Transportation (Non-Medical):   Physical Activity:   . Days of Exercise per Week:   . Minutes of Exercise per Session:   Stress:   . Feeling of Stress :   Social Connections:   . Frequency of Communication with Friends and Family:   . Frequency of Social Gatherings with Friends and Family:   . Attends Religious Services:   . Active Member of  Clubs or Organizations:   . Attends Archivist Meetings:   Marland Kitchen Marital Status:   Intimate Partner Violence:   . Fear of Current or Ex-Partner:   . Emotionally Abused:   Marland Kitchen Physically Abused:   . Sexually Abused:     Past Surgical History:  Procedure Laterality Date  . ABDOMINAL HERNIA REPAIR     "after they took tumor out of my colon I developed a hernia"  . ABDOMINAL HYSTERECTOMY    . BACK SURGERY    . BREAST BIOPSY Right 2012   positive  . BREAST BIOPSY Right 2014   positive  . BREAST BIOPSY Right 06/26/2016   US guided - waiting for pathology  . BREAST EXCISIONAL BIOPSY Right 1995   benign  . CARPAL TUNNEL RELEASE Left   . DILATION AND CURETTAGE OF UTERUS     S/P miscarriage  . HERNIA REPAIR    . IR GENERIC HISTORICAL  04/04/2016   IR LUMBAR DISC ASPIRATION W/IMG GUIDE 04/04/2016 [name], MD [facility]  . JOINT REPLACEMENT    . LAPAROSCOPIC CHOLECYSTECTOMY  2016  . PORTACATH PLACEMENT Left 09/13/2019   Procedure: INSERTION PORT-A-CATH;  Surgeon: Robert Bellow, MD;  Location: [facility] ORS;  Service: General;  Laterality: Left;  . REPLACEMENT TOTAL HIP W/  RESURFACING IMPLANTS Bilateral   . SKIN CANCER EXCISION     "right neck; mid forehead"  . TOTAL HIP ARTHROPLASTY Right 10/03/2015   Procedure: TOTAL HIP ARTHROPLASTY ANTERIOR APPROACH;  Surgeon: Hessie Knows, MD;  Location: [facility] ORS;  Service: Orthopedics;  Laterality: Right;  . TOTAL HIP ARTHROPLASTY Left 07/25/2016   Procedure: TOTAL HIP ARTHROPLASTY ANTERIOR APPROACH;  Surgeon: Hessie Knows, MD;  Location: [facility] OR[facility];  Service: Orthopedics;  Laterality: Left;  . TUBAL LIGATION    . TUMOR EXCISION  2012   "small intestine; came from the breast cancer"    Family History  Problem Relation Age of Onset  . Lung cancer Brother 42  . Heart disease Father   . Diabetes Father   . Arthritis Mother   . Breast cancer Neg Hx   . Kidney cancer Neg Hx   . Prostate cancer Neg Hx     Allergies  Allergen  Reactions  . Carafate [Sucralfate] Nausea Only  . Diclofenac Sodium Nausea Only  .  Erythromycin Diarrhea and Nausea And Vomiting  . Adhesive [Tape] Itching and Rash  . Wellbutrin [Bupropion] Itching and Rash       Assessment & Plan:   1. Raynaud's syndrome without gangrene Based on patient's description of upper extremity cyanosis in addition to relieving factors it sounds like it is greatly related to Raynaud's disease.  In order to confirm we will perform an upper extremity Doppler with finger waveforms in order to ascertain if there is any other arterial cause or if it is more likely related to Raynaud's syndrome. - VAS UE DOPPLER BILAT/COMP TOS, DIGITS (TO&UE REYNAUDS); Future  2. Toe cyanosis The patient denies any claudication-like symptoms in addition to any rest pain like symptoms.  Patient's discoloration may be more so due to some venous return issues.  Also lower extremity discoloration could be related to Raynaud's as well.  The patient's tingling, aching and heavy sensation may be related more so to neuropathy versus a vascular issue.  Currently the patient is on chemotherapy and the current chemotherapy that she is on can cause peripheral neuropathy. - VAS [place] LOWER EXTREMITY VENOUS REFLUX; Future   Current Outpatient Medications on File Prior to Visit  Medication Sig Dispense Refill  . acetaminophen (TYLENOL) 500 MG tablet Take 1,000 mg by mouth daily as needed for moderate pain or headache.     . albuterol (VENTOLIN HFA) 108 (90 Base) MCG/ACT inhaler Inhale 2 puffs into the lungs every 4 (four) hours as needed.    . citalopram (CELEXA) 20 MG tablet Take 20 mg by mouth every morning.     Marland Kitchen ipratropium (ATROVENT) 0.06 % nasal spray     . levothyroxine (SYNTHROID, LEVOTHROID) 100 MCG tablet Take 100 mcg by mouth daily before breakfast.     . montelukast (SINGULAIR) 10 MG tablet Take 10 mg by mouth at bedtime.     . Multiple Vitamins-Minerals (CENTRUM SILVER PO) Take 1 tablet  by mouth every morning.    Marland Kitchen omeprazole (PRILOSEC) 20 MG capsule Take 20 mg by mouth every morning.     . ondansetron (ZOFRAN) 8 MG tablet Take 1 tablet (8 mg total) by mouth 2 (two) times daily as needed (Nausea or vomiting). 30 tablet 1  . pravastatin (PRAVACHOL) 40 MG tablet Take 40 mg by mouth at bedtime.     . prochlorperazine (COMPAZINE) 10 MG tablet Take 1 tablet (10 mg total) by mouth every 6 (six) hours as needed (Nausea or vomiting). 30 tablet 1  . traMADol (ULTRAM) 50 MG tablet Take 1 tablet (50 mg total) by mouth every 6 (six) hours as needed. 60 tablet 1  . valsartan-hydrochlorothiazide (DIOVAN-HCT) 80-12.5 MG tablet Take 1 tablet by mouth daily.    Grant Ruts INHUB 100-50 MCG/DOSE AEPB Inhale 1 puff into the lungs daily.     Marland Kitchen zolpidem (AMBIEN) 10 MG tablet Take 1 tablet (10 mg total) by mouth at bedtime. 30 tablet 2   No current facility-administered medications on file prior to visit.    There are no Patient Instructions on file for this visit. No follow-ups on file.   Kris Hartmann, NP

## 2019-11-24 ENCOUNTER — Ambulatory Visit (INDEPENDENT_AMBULATORY_CARE_PROVIDER_SITE_OTHER): Payer: Medicare Other | Admitting: Nurse Practitioner

## 2019-11-24 ENCOUNTER — Ambulatory Visit (INDEPENDENT_AMBULATORY_CARE_PROVIDER_SITE_OTHER): Payer: Medicare Other

## 2019-11-24 ENCOUNTER — Other Ambulatory Visit: Payer: Self-pay

## 2019-11-24 ENCOUNTER — Encounter (INDEPENDENT_AMBULATORY_CARE_PROVIDER_SITE_OTHER): Payer: Self-pay | Admitting: Nurse Practitioner

## 2019-11-24 VITALS — BP 131/72 | HR 83 | Resp 16 | Wt 148.4 lb

## 2019-11-24 DIAGNOSIS — I7300 Raynaud's syndrome without gangrene: Secondary | ICD-10-CM

## 2019-11-24 DIAGNOSIS — I10 Essential (primary) hypertension: Secondary | ICD-10-CM

## 2019-11-24 DIAGNOSIS — R200 Anesthesia of skin: Secondary | ICD-10-CM | POA: Diagnosis not present

## 2019-11-24 DIAGNOSIS — R202 Paresthesia of skin: Secondary | ICD-10-CM | POA: Diagnosis not present

## 2019-11-24 DIAGNOSIS — M79605 Pain in left leg: Secondary | ICD-10-CM | POA: Diagnosis not present

## 2019-11-24 DIAGNOSIS — M79604 Pain in right leg: Secondary | ICD-10-CM | POA: Diagnosis not present

## 2019-11-24 NOTE — Progress Notes (Signed)
Subjective:    Patient ID: Sara David, female    DOB: March 01, 1946, 74 y.o.   MRN: [id number] Chief Complaint  Patient presents with  . Follow-up    ultrasound follow up    Patient presents today as referral from Dr. Grayland Ormond related to her hands turning blue.  The patient states that it is mostly her left upper extremity however within the last week her right upper extremity began to turn blue as well.  The patient states that her fingers turn blue and once she placed them into warm water and rubs them it helps to return the color.  When this happens her fingers are numb and painful.  The patient also endorses having some cramping in her lower extremities at night while she is sleeping as well as in her hand.  Since the patient's last visit she has not had any episodes of discoloration however she has had episodes of cramping in her hands.  The patient also continues to have heaviness as well as tingling and achiness in her legs.  She describes it in the posterior portion of her leg.  She also endorses some foot cramping as well.    The patient is a current smoker.  The patient is also undergoing treatment for cancer with chemotherapy.  She denies any classic claudication-like symptoms or rest pain like symptoms.  She denies any ulcerations or wounds on her lower extremities.  Today the patient underwent noninvasive studies to include an upper extremity Doppler.  There was no significant arterial obstruction detected in the bilateral upper extremities.  There was no evidence of significant decreased perfusion to the bilateral upper extremity digits.  The patient had triphasic waveforms from her subclavian artery down to her radial/ulnar arteries bilaterally.  The patient had strong finger waveforms bilaterally for all digits.  The patient also underwent bilateral lower extremity venous reflux studies which reveals reflux in the left common femoral vein.  Otherwise there is no evidence of DVT or  superficial venous thrombosis bilaterally.  There is no evidence of superficial venous reflux seen bilaterally.  Doppler waveforms of the bilateral tibial arteries was obtained which showed triphasic waveforms bilaterally.   Review of Systems  Cardiovascular:       Color changes in fingertips  Musculoskeletal:       Muscle cramps  Neurological:       Tingling and heaviness in legs  All other systems reviewed and are negative.      Objective:   Physical Exam Vitals reviewed.  HENT:     Head: Normocephalic.  Cardiovascular:     Pulses: Normal pulses.  Neurological:     Mental Status: She is alert and oriented to person, place, and time.  Psychiatric:        Mood and Affect: Mood normal.        Behavior: Behavior normal.        Thought Content: Thought content normal.        Judgment: Judgment normal.     BP 131/72 (BP Location: Right Arm)   Pulse 83   Resp 16   Wt 148 lb 6.4 oz (67.3 kg)   BMI 25.47 kg/m   Past Medical History:  Diagnosis Date  . Anxiety   . Arthritis    "back" (04/03/2016)  . Breast cancer ([place]) 2012    no lumpectomy. Treating with meds- Right  . Cancer ([place])    small intestines 2012, gallbladder 2016  . Chronic kidney disease   .  Chronic lower back pain   . COPD (chronic obstructive pulmonary disease) ([place])    "use inhalers for COPD that dx'd at one time" (04/03/2016)  . Dyspnea   . Family history of adverse reaction to anesthesia    daughter gets nauseated  . GERD (gastroesophageal reflux disease)   . High cholesterol   . Hypertension   . Hypothyroidism   . Skin cancer    "right neck; mid forehead"    Social History   Socioeconomic History  . Marital status: Married    Spouse name: Not on file  . Number of children: Not on file  . Years of education: Not on file  . Highest education level: Not on file  Occupational History  . Not on file  Tobacco Use  . Smoking status: Current Every Day Smoker    Packs/day: 1.00    Years:  54.00    Pack years: 54.00    Types: Cigarettes  . Smokeless tobacco: Never Used  . Tobacco comment: not ready to quit  Substance and Sexual Activity  . Alcohol use: No  . Drug use: No  . Sexual activity: Not Currently  Other Topics Concern  . Not on file  Social History Narrative  . Not on file   Social Determinants of Health   Financial Resource Strain:   . Difficulty of Paying Living Expenses:   Food Insecurity:   . Worried About Charity fundraiser in the Last Year:   . Arboriculturist in the Last Year:   Transportation Needs:   . Film/video editor (Medical):   Marland Kitchen Lack of Transportation (Non-Medical):   Physical Activity:   . Days of Exercise per Week:   . Minutes of Exercise per Session:   Stress:   . Feeling of Stress :   Social Connections:   . Frequency of Communication with Friends and Family:   . Frequency of Social Gatherings with Friends and Family:   . Attends Religious Services:   . Active Member of Clubs or Organizations:   . Attends Archivist Meetings:   Marland Kitchen Marital Status:   Intimate Partner Violence:   . Fear of Current or Ex-Partner:   . Emotionally Abused:   Marland Kitchen Physically Abused:   . Sexually Abused:     Past Surgical History:  Procedure Laterality Date  . ABDOMINAL HERNIA REPAIR     "after they took tumor out of my colon I developed a hernia"  . ABDOMINAL HYSTERECTOMY    . BACK SURGERY    . BREAST BIOPSY Right 2012   positive  . BREAST BIOPSY Right 2014   positive  . BREAST BIOPSY Right 06/26/2016   US guided - waiting for pathology  . BREAST EXCISIONAL BIOPSY Right 1995   benign  . CARPAL TUNNEL RELEASE Left   . DILATION AND CURETTAGE OF UTERUS     S/P miscarriage  . HERNIA REPAIR    . IR GENERIC HISTORICAL  04/04/2016   IR LUMBAR DISC ASPIRATION W/IMG GUIDE 04/04/2016 [name], MD [facility]  . JOINT REPLACEMENT    . LAPAROSCOPIC CHOLECYSTECTOMY  2016  . PORTACATH PLACEMENT Left 09/13/2019   Procedure:  INSERTION PORT-A-CATH;  Surgeon: Robert Bellow, MD;  Location: [facility] ORS;  Service: General;  Laterality: Left;  . REPLACEMENT TOTAL HIP W/  RESURFACING IMPLANTS Bilateral   . SKIN CANCER EXCISION     "right neck; mid forehead"  . TOTAL HIP ARTHROPLASTY Right 10/03/2015   Procedure: TOTAL  HIP ARTHROPLASTY ANTERIOR APPROACH;  Surgeon: Hessie Knows, MD;  Location: [facility] ORS;  Service: Orthopedics;  Laterality: Right;  . TOTAL HIP ARTHROPLASTY Left 07/25/2016   Procedure: TOTAL HIP ARTHROPLASTY ANTERIOR APPROACH;  Surgeon: Hessie Knows, MD;  Location: [facility] ORS;  Service: Orthopedics;  Laterality: Left;  . TUBAL LIGATION    . TUMOR EXCISION  2012   "small intestine; came from the breast cancer"    Family History  Problem Relation Age of Onset  . Lung cancer Brother 66  . Heart disease Father   . Diabetes Father   . Arthritis Mother   . Breast cancer Neg Hx   . Kidney cancer Neg Hx   . Prostate cancer Neg Hx     Allergies  Allergen Reactions  . Carafate [Sucralfate] Nausea Only  . Diclofenac Sodium Nausea Only  . Erythromycin Diarrhea and Nausea And Vomiting  . Adhesive [Tape] Itching and Rash  . Wellbutrin [Bupropion] Itching and Rash       Assessment & Plan:   1. 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  Typically Raynaud's disease is most active for patients in the months of November through April when the weather is cold and variable.  However at this time the weather is beginning to warm consistently.  Based on this, we will not make any medication changes today as the patient's symptoms have decreased in frequency.  This is also because the first-line treatment would be calcium channel blockers which would have some effect on the patient's blood pressure and currently her blood pressure is well controlled.  However, the patient is advised that if she  continues to have increased frequency of these attacks in the coming weeks and months she can contact her office and we can try beginning calcium channel blockers.  The patient is also moving to [place] soon and the temperature tends to be milder in that area so this too may help her Raynaud's disease.  2. Benign essential hypertension Patient currently has good blood pressure control.  On appropriate medication.  No changes made today.  3. Numbness and tingling of both lower extremities Based on the patient's noninvasive studies in addition to the fact that the patient is undergoing chemotherapy, the tingling sensation that the patient is having may be more so related to neuropathy.  Based on her noninvasive studies were unable to find a vascular cause for the tingling and discomfort.  I discussed with the patient that we could place a referral to neurology in order to further work-up or she can consult with her oncologist as neuropathy is a common side effect of chemotherapy and they may have more treatment options.  Current Outpatient Medications on File Prior to Visit  Medication Sig Dispense Refill  . acetaminophen (TYLENOL) 500 MG tablet Take 1,000 mg by mouth daily as needed for moderate pain or headache.     . albuterol (VENTOLIN HFA) 108 (90 Base) MCG/ACT inhaler Inhale 2 puffs into the lungs every 4 (four) hours as needed.    . citalopram (CELEXA) 20 MG tablet Take 20 mg by mouth every morning.     Marland Kitchen ipratropium (ATROVENT) 0.06 % nasal spray     . levothyroxine (SYNTHROID, LEVOTHROID) 100 MCG tablet Take 100 mcg by mouth daily before breakfast.     . montelukast (SINGULAIR) 10 MG tablet Take 10 mg by mouth at bedtime.     . Multiple  Vitamins-Minerals (CENTRUM SILVER PO) Take 1 tablet by mouth every morning.    Marland Kitchen omeprazole (PRILOSEC) 20 MG capsule Take 20 mg by mouth every morning.     . ondansetron (ZOFRAN) 8 MG tablet Take 1 tablet (8 mg total) by mouth 2 (two) times daily as needed  (Nausea or vomiting). 30 tablet 1  . pravastatin (PRAVACHOL) 40 MG tablet Take 40 mg by mouth at bedtime.     . prochlorperazine (COMPAZINE) 10 MG tablet Take 1 tablet (10 mg total) by mouth every 6 (six) hours as needed (Nausea or vomiting). 30 tablet 1  . traMADol (ULTRAM) 50 MG tablet Take 1 tablet (50 mg total) by mouth every 6 (six) hours as needed. 60 tablet 1  . valsartan-hydrochlorothiazide (DIOVAN-HCT) 80-12.5 MG tablet Take 1 tablet by mouth daily.    Grant Ruts INHUB 100-50 MCG/DOSE AEPB Inhale 1 puff into the lungs daily.     Marland Kitchen zolpidem (AMBIEN) 10 MG tablet Take 1 tablet (10 mg total) by mouth at bedtime. 30 tablet 2   No current facility-administered medications on file prior to visit.    There are no Patient Instructions on file for this visit. No follow-ups on file.   Kris Hartmann, NP

## 2019-11-25 ENCOUNTER — Other Ambulatory Visit: Payer: Self-pay

## 2019-11-25 ENCOUNTER — Encounter
Admission: RE | Admit: 2019-11-25 | Discharge: 2019-11-25 | Disposition: A | Discharge status: Home / Self Care | Payer: Medicare Other | Source: Ambulatory Visit | Attending: Oncology | Admitting: Oncology

## 2019-11-25 DIAGNOSIS — Z79899 Other long term (current) drug therapy: Secondary | ICD-10-CM | POA: Diagnosis not present

## 2019-11-25 DIAGNOSIS — C50911 Malignant neoplasm of unspecified site of right female breast: Secondary | ICD-10-CM | POA: Insufficient documentation

## 2019-11-25 DIAGNOSIS — C7951 Secondary malignant neoplasm of bone: Secondary | ICD-10-CM | POA: Diagnosis not present

## 2019-11-25 LAB — GLUCOSE, CAPILLARY: Glucose-Capillary: 91 mg/dL (ref 70–99)

## 2019-11-25 MED ORDER — FLUDEOXYGLUCOSE F - 18 (FDG) INJECTION
7.7000 | Freq: Once | INTRAVENOUS | Status: AC | PRN
  Administered 2019-11-25: 7.77 via INTRAVENOUS

## 2019-11-26 NOTE — Progress Notes (Signed)
[place]  [phone] [phone]  ID: Sara David OB: Jun 11, 1946  MR#: [id number]  [id number]  [name], [name] Hessie Knows, MD as Consulting Physician (Orthopedic Surgery) Murlean Iba, MD (Nephrology) Lloyd Huger, MD as Consulting Physician (Hematology and Oncology)  I connected with Sara David on 12/01/19 at  2:15 PM EDT by video enabled telemedicine visit and verified that I am speaking with the correct person using two identifiers.   I discussed the limitations, risks, security and privacy concerns of performing an evaluation and management service by telemedicine and the availability of in-person appointments. I also discussed with the patient that there may be a patient responsible charge related to this service. The patient expressed understanding and agreed to proceed.   Other persons participating in the visit and their role in the encounter: Patient, MD.  Patients location: Home. Providers location: Clinic.  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  INTERVAL HISTORY: Patient agreed to video assisted telemedicine visit for further evaluation and discussion of her PET scan results.  She continues to have chronic back pain that is unchanged.  She also has a mild peripheral neuropathy.  She has no other neurologic complaints.  She does not complain of abdominal pain today.  She denies any recent fevers or illnesses.  She denies any chest pain, shortness of breath, cough, or hemoptysis.  She has a good appetite and denies weight loss.  She denies any nausea, vomiting, constipation, or diarrhea.  She has no melena or hematochezia.  She has no urinary complaints.  Patient offers no further specific complaints today.  REVIEW OF SYSTEMS:   Review of Systems  Constitutional:  Negative.  Negative for fever, malaise/fatigue and weight loss.  Respiratory: Negative.  Negative for cough and shortness of breath.   Cardiovascular: Negative.  Negative for chest pain and leg swelling.  Gastrointestinal: Negative.  Negative for abdominal pain, blood in stool, diarrhea and melena.  Genitourinary: Negative.   Musculoskeletal: Positive for back pain and joint pain.  Skin: Negative.  Negative for rash.  Neurological: Positive for tingling and sensory change. Negative for dizziness, focal weakness, weakness and headaches.  Psychiatric/Behavioral: Negative.  The patient is not nervous/anxious.     As per HPI. Otherwise, a complete review of systems is negative.  PAST MEDICAL HISTORY: Past Medical History:  Diagnosis Date   Anxiety    Arthritis    "back" (04/03/2016)   Breast cancer ([place]) 2012    no lumpectomy. Treating with meds- Right   Cancer ([place])    small intestines 2012, gallbladder 2016   Chronic kidney disease    Chronic lower back pain    COPD (chronic obstructive pulmonary disease) ([place])    "use inhalers for COPD that dx'd at one time" (04/03/2016)   Dyspnea    Family history of adverse reaction to anesthesia    daughter gets nauseated   GERD (gastroesophageal reflux disease)    High cholesterol    Hypertension    Hypothyroidism    Skin cancer    "right neck; mid forehead"    PAST SURGICAL HISTORY: Past Surgical History:  Procedure Laterality Date   ABDOMINAL HERNIA REPAIR     "after they took tumor out of my colon I developed a hernia"   ABDOMINAL HYSTERECTOMY     BACK SURGERY     BREAST BIOPSY Right  2012   positive   BREAST BIOPSY Right 2014   positive   BREAST BIOPSY Right 06/26/2016   US guided - waiting for pathology   BREAST EXCISIONAL BIOPSY Right 1995   benign   CARPAL TUNNEL RELEASE Left    DILATION AND CURETTAGE OF UTERUS     S/P miscarriage   HERNIA REPAIR     IR GENERIC HISTORICAL  04/04/2016   IR LUMBAR  DISC ASPIRATION W/IMG GUIDE 04/04/2016 [name], MD [facility]   JOINT REPLACEMENT     LAPAROSCOPIC CHOLECYSTECTOMY  2016   PORTACATH PLACEMENT Left 09/13/2019   Procedure: INSERTION PORT-A-CATH;  Surgeon: Robert Bellow, MD;  Location: [facility] ORS;  Service: General;  Laterality: Left;   REPLACEMENT TOTAL HIP W/  RESURFACING IMPLANTS Bilateral    SKIN CANCER EXCISION     "right neck; mid forehead"   TOTAL HIP ARTHROPLASTY Right 10/03/2015   Procedure: TOTAL HIP ARTHROPLASTY ANTERIOR APPROACH;  Surgeon: Hessie Knows, MD;  Location: [facility] ORS;  Service: Orthopedics;  Laterality: Right;   TOTAL HIP ARTHROPLASTY Left 07/25/2016   Procedure: TOTAL HIP ARTHROPLASTY ANTERIOR APPROACH;  Surgeon: Hessie Knows, MD;  Location: [facility] ORS;  Service: Orthopedics;  Laterality: Left;   TUBAL LIGATION     TUMOR EXCISION  2012   "small intestine; came from the breast cancer"    FAMILY HISTORY: Reviewed and unchanged. No reported history of breast cancer or other chronic diseases.     ADVANCED DIRECTIVES:    HEALTH MAINTENANCE: Social History   Tobacco Use   Smoking status: Current Every Day Smoker    Packs/day: 1.00    Years: 54.00    Pack years: 54.00    Types: Cigarettes   Smokeless tobacco: Never Used   Tobacco comment: not ready to quit  Substance Use Topics   Alcohol use: No   Drug use: No     Colonoscopy:  PAP:  Bone density:  Lipid panel:  Allergies  Allergen Reactions   Carafate [Sucralfate] Nausea Only   Diclofenac Sodium Nausea Only   Erythromycin Diarrhea and Nausea And Vomiting   Adhesive [Tape] Itching and Rash   Wellbutrin [Bupropion] Itching and Rash    Current Outpatient Medications  Medication Sig Dispense Refill   acetaminophen (TYLENOL) 500 MG tablet Take 1,000 mg by mouth daily as needed for moderate pain or headache.      albuterol (VENTOLIN HFA) 108 (90 Base) MCG/ACT inhaler Inhale 2 puffs into the lungs every 4 (four) hours as  needed.     citalopram (CELEXA) 20 MG tablet Take 20 mg by mouth every morning.      ipratropium (ATROVENT) 0.06 % nasal spray      letrozole (FEMARA) 2.5 MG tablet Take 1 tablet (2.5 mg total) by mouth daily. 90 tablet 3   levothyroxine (SYNTHROID, LEVOTHROID) 100 MCG tablet Take 100 mcg by mouth daily before breakfast.      montelukast (SINGULAIR) 10 MG tablet Take 10 mg by mouth at bedtime.      Multiple Vitamins-Minerals (CENTRUM SILVER PO) Take 1 tablet by mouth every morning.     omeprazole (PRILOSEC) 20 MG capsule Take 20 mg by mouth every morning.      ondansetron (ZOFRAN) 8 MG tablet Take 1 tablet (8 mg total) by mouth 2 (two) times daily as needed (Nausea or vomiting). 30 tablet 1   pravastatin (PRAVACHOL) 40 MG tablet Take 40 mg by mouth at bedtime.      prochlorperazine (COMPAZINE) 10 MG tablet Take  1 tablet (10 mg total) by mouth every 6 (six) hours as needed (Nausea or vomiting). 30 tablet 1   traMADol (ULTRAM) 50 MG tablet Take 1 tablet (50 mg total) by mouth every 6 (six) hours as needed. 60 tablet 1   valsartan-hydrochlorothiazide (DIOVAN-HCT) 80-12.5 MG tablet Take 1 tablet by mouth daily.     WIXELA INHUB 100-50 MCG/DOSE AEPB Inhale 1 puff into the lungs daily.      zolpidem (AMBIEN) 10 MG tablet Take 1 tablet (10 mg total) by mouth at bedtime. 30 tablet 2   No current facility-administered medications for this visit.    OBJECTIVE: Vitals:     There is no height or weight on file to calculate BMI.    ECOG FS:0 - Asymptomatic  General: Well-developed, well-nourished, no acute distress. HEENT: Normocephalic. Neuro: Alert, answering all questions appropriately. Cranial nerves grossly intact. Psych: Normal affect.   LAB RESULTS: Lab Results  Component Value Date   NA 136 11/11/2019   K 4.1 11/11/2019   CL 104 11/11/2019   CO2 23 11/11/2019   GLUCOSE 95 11/11/2019   BUN 27 (H) 11/11/2019   CREATININE 1.23 (H) 11/11/2019   CALCIUM 8.8 (L) 11/11/2019     PROT 7.3 11/11/2019   ALBUMIN 4.1 11/11/2019   AST 36 11/11/2019   ALT 28 11/11/2019   ALKPHOS 121 11/11/2019   BILITOT 0.5 11/11/2019   GFRNONAA 43 (L) 11/11/2019   GFRAA 50 (L) 11/11/2019    Lab Results  Component Value Date   WBC 6.3 11/11/2019   NEUTROABS 4.9 11/11/2019   HGB 8.9 (L) 11/11/2019   HCT 26.9 (L) 11/11/2019   MCV 95.7 11/11/2019   PLT 190 11/11/2019    STUDIES: NM PET Image Restag (PS) Skull Base To Thigh  Result Date: 11/25/2019 CLINICAL DATA:  Subsequent treatment strategy for RIGHT breast carcinoma. Chemotherapy ongoing. EXAM: NUCLEAR MEDICINE PET SKULL BASE TO THIGH TECHNIQUE: 7.8 mCi F-18 FDG was injected intravenously. Full-ring PET imaging was performed from the skull base to thigh after the radiotracer. CT data was obtained and used for attenuation correction and anatomic localization. Fasting blood glucose:  91 mg/dl COMPARISON:  PET-CT 07/19/2011 FINDINGS: Mediastinal blood pool activity: SUV max 2.2 Liver activity: SUV max NA NECK: No hypermetabolic lymph nodes in the neck. Incidental CT findings: none CHEST: No hypermetabolic mediastinal or hilar nodes. No suspicious pulmonary nodules on the CT scan. Incidental CT findings: Port in the anterior chest wall with tip in distal SVC. ABDOMEN/PELVIS: No abnormal hypermetabolic activity within the liver, pancreas, adrenal glands, or spleen. No hypermetabolic lymph nodes in the abdomen or pelvis. Resolution of metabolic activity in the terminal ileum consistent benign physiologic activity Incidental CT findings: Atherosclerotic calcification of the aorta. Bowel anastomosis in the upper pelvis. Small amount free fluid loculated in the leaves of the mesentery (image 182/3 for example). SKELETON: Again demonstrate multifocal hypermetabolic skeletal metastasis on the background of diffuse sclerotic metastasis. Example lesions in the pelvis are similar with large lesion in the RIGHT sacral ala with SUV max equal 8.7 compared  SUV max equal 10.0. Lesion the posterior RIGHT iliac bone with SUV max equal 7.3 compared SUV max 6.3. There is multifocal metastatic metabolic activity within the thoracic spinal slightly increased. For example lesion at T10 with SUV max equal 7.3 compared SUV max equal 5.4. Lesions in the sternum appears slightly increased with SUV max equal 4.2 by compared SUV max equal 3.5. There multiple rib lesion identified. No new lesions are present Incidental  CT findings: Bilateral hip prosthetics. IMPRESSION: 1. Essentially stable exam. On the background diffuse skeletal sclerotic metastasis, foci of intense metabolic skeletal activity within the pelvis, spine ribs and sternum. Lesions in the pelvis are similar. Mild increase in metabolic activity of lesions in the thoracic spine. 2. No evidence of soft or visceral metastasis. 3. Small volume free fluid within the bowel mesentery. Electronically Signed   By: Suzy Bouchard M.D.   On: 11/25/2019 13:07   VAS UE DOPPLER BILAT/COMP TOS, DIGITS (TO&UE REYNAUDS)  Result Date: 11/30/2019 UPPER EXTREMITY DOPPLER STUDY Indications: Raynaud's syndrome without gangrene. History:     C/O bilateral hand cramping with finger discoloration (mainly left              3rd digit).  Performing Technologist: Blondell Reveal RT, RDMS, RVT  Examination Guidelines: A complete evaluation includes B-mode imaging, spectral Doppler, color Doppler, and power Doppler as needed of all accessible portions of each vessel. Bilateral testing is considered an integral part of a complete examination. Limited examinations for reoccurring indications may be performed as noted.  Right Doppler Findings: +-----------------+--------+-----+---------+--------+  Site              Pressure Index Doppler   Comments  +-----------------+--------+-----+---------+--------+  Subclavian                       triphasic           +-----------------+--------+-----+---------+--------+  Brachial          154             triphasic           +-----------------+--------+-----+---------+--------+  Radial            156      1.01  triphasic           +-----------------+--------+-----+---------+--------+  Ulnar             153      0.99  triphasic           +-----------------+--------+-----+---------+--------+  Digit (3rd digit) 146      0.95  Normal              +-----------------+--------+-----+---------+--------+  Left Doppler Findings: +-----------------+--------+-----+---------+--------+  Site              Pressure Index Doppler   Comments  +-----------------+--------+-----+---------+--------+  Subclavian                       triphasic           +-----------------+--------+-----+---------+--------+  Brachial          149            triphasic           +-----------------+--------+-----+---------+--------+  Radial            151      0.98  triphasic           +-----------------+--------+-----+---------+--------+  Ulnar             150      0.97  triphasic           +-----------------+--------+-----+---------+--------+  Digit (3rd digit) 150      0.97  Normal              +-----------------+--------+-----+---------+--------+  Test Notes: Warm water immersion of hands was not indicated/performed due to normal PPG waveforms throughout the bilateral upper extremity digits at ambient room temperature.  Summary:  Right: No significant arterial obstruction detected in the right        upper extremity. No evidence of significant decreased        perfusion to the upper extremity digits. Left: No significant arterial obstruction detected in the left upper       extremity. No evidence of significant decreased perfusion to       the upper extremity digits. *See table(s) above for measurements and observations. Electronically signed by Leotis Pain MD on 11/30/2019 at 8:12:24 AM.    Final    VAS [place] LOWER EXTREMITY VENOUS REFLUX  Result Date: 11/30/2019  Lower Venous Reflux Study Indications: Leg pain & heaviness.  Performing Technologist:  Blondell Reveal RT, RDMS, RVT  Examination Guidelines: A complete evaluation includes B-mode imaging, spectral Doppler, color Doppler, and power Doppler as needed of all accessible portions of each vessel. Bilateral testing is considered an integral part of a complete examination. Limited examinations for reoccurring indications may be performed as noted. The reflux portion of the exam is performed with the patient in reverse Trendelenburg. Significant venous reflux is defined as >500 ms in the superficial venous system, and >1 second in the deep venous system.  Venous Reflux Times +--------------+---------+------+-----------+------------+--------+  RIGHT          Reflux No Reflux Reflux Time Diameter cms Comments                             Yes                                      +--------------+---------+------+-----------+------------+--------+  CFV            no                                                  +--------------+---------+------+-----------+------------+--------+  FV mid         no                                                  +--------------+---------+------+-----------+------------+--------+  Popliteal      no                                                  +--------------+---------+------+-----------+------------+--------+  GSV at [facility]     no                                                  +--------------+---------+------+-----------+------------+--------+  GSV prox thigh no                                                  +--------------+---------+------+-----------+------------+--------+  GSV at knee  no                                                  +--------------+---------+------+-----------+------------+--------+  SSV prox calf  no                                                  +--------------+---------+------+-----------+------------+--------+  +--------------+---------+------+-----------+------------+--------+  LEFT           Reflux No Reflux Reflux Time Diameter cms Comments                              Yes                                      +--------------+---------+------+-----------+------------+--------+  CFV                       yes    >1 second                         +--------------+---------+------+-----------+------------+--------+  FV mid         no                                                  +--------------+---------+------+-----------+------------+--------+  Popliteal      no                                                  +--------------+---------+------+-----------+------------+--------+  GSV at [facility]     no                                                  +--------------+---------+------+-----------+------------+--------+  GSV prox thigh no                                                  +--------------+---------+------+-----------+------------+--------+  GSV at knee    no                                                  +--------------+---------+------+-----------+------------+--------+  SSV prox calf  no                                                  +--------------+---------+------+-----------+------------+--------+  Normal Doppler waveforms of the bilateral distal anterior and posterior tibial arteries suggestive of normal perfusion to the bilateral lower extremities.  Summary: Right: - No evidence of deep vein thrombosis seen in the right lower extremity, from the common femoral through the popliteal veins. - No evidence of superficial venous thrombosis in the right lower extremity. - There is no evidence of venous reflux seen in the right lower extremity.  Left: - No evidence of deep vein thrombosis seen in the left lower extremity, from the common femoral through the popliteal veins. - No evidence of superficial venous thrombosis in the left lower extremity. - No evidence of superficial venous reflux seen in the left greater saphenous vein. - No evidence of superficial venous reflux seen in the left short saphenous vein. - Venous reflux is noted in the left  common femoral vein.  *See table(s) above for measurements and observations. Electronically signed by Leotis Pain MD on 11/30/2019 at 8:12:19 AM.    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  PLAN:    1.  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  PET scan results from July 19, 2019 reviewed independently with new metastatic lesions in bone.  Patient has no evidence of visceral disease.  She also had a slowly increasing CA 27-29.  After lengthy discussion, patient agreed to discontinue Ibrance and letrozole and initiate Halaven.  Patient completed 4 cycles of Halaven on November 11, 2019. She last received Zometa on October 21, 2019.  PET scan results from November 25, 2019 reviewed independently and report as above with essentially stable disease.  No intervention is needed at this time.  Patient is now moving to [place], therefore no follow-up has been scheduled.  Patient will likely require repeat imaging in approximately 3 months to assess for any interval change.  She has been instructed to reinitiate letrozole and was given a prescription today.   2.  Hip/back pain: Improved.  Unrelated to malignancy.  Follow-up with orthopedics as indicated.  Continue tramadol as needed. 3.  Anemia: Chronic and unchanged.  Patient's most recent hemoglobin was 8.9. 4.  Renal insufficiency: Creatinine mildly increased to 1.23.  Appreciate nephrology input. 5.  Leukopenia: Resolved. 6.  Thrombocytopenia: Resolved. 7.  Bony lesions: Zometa as above. 8.  Elevated liver enzymes: Resolved. 9.  Venous access: Patient now has had port placement. 10.  Abdominal pain: Patient does not complain of this today.  Imaging as above revealed no obvious pathology.  Possibly  related to adhesions from patient's multiple abdominal surgeries. 11.  Hand/foot pain: Appears to be vascular in nature.  Appreciate vascular surgery input.  I provided 30 minutes of face-to-face video visit time during this encounter which included chart review, counseling, and coordination of care as documented above.   Patient expressed understanding and was in agreement with this plan. She also understands that She can call clinic at any time with any questions, concerns, or complaints.    Lloyd Huger, MD   12/01/2019 6:44 AM

## 2019-11-30 ENCOUNTER — Inpatient Hospital Stay: Payer: Medicare Other | Attending: Oncology | Admitting: Oncology

## 2019-11-30 ENCOUNTER — Encounter: Payer: Self-pay | Admitting: Oncology

## 2019-11-30 ENCOUNTER — Other Ambulatory Visit: Payer: Self-pay | Admitting: Emergency Medicine

## 2019-11-30 ENCOUNTER — Other Ambulatory Visit: Payer: Self-pay

## 2019-11-30 DIAGNOSIS — C801 Malignant (primary) neoplasm, unspecified: Secondary | ICD-10-CM | POA: Diagnosis not present

## 2019-11-30 DIAGNOSIS — C50911 Malignant neoplasm of unspecified site of right female breast: Secondary | ICD-10-CM | POA: Diagnosis not present

## 2019-11-30 MED ORDER — ZOLPIDEM TARTRATE 10 MG PO TABS: 10.0000 mg | ORAL_TABLET | Freq: Every day | ORAL | 2 refills | Status: DC

## 2019-11-30 MED ORDER — LETROZOLE 2.5 MG PO TABS: 2.5000 mg | ORAL_TABLET | Freq: Every day | ORAL | 3 refills | Status: AC

## 2019-11-30 NOTE — Progress Notes (Signed)
Pt having virtual visit to go over PET results. Went over with patient when she needs her port flushed again, and pt requesting refill of ambien.

## 2019-12-22 ENCOUNTER — Other Ambulatory Visit: Payer: Self-pay | Admitting: *Deleted

## 2019-12-22 DIAGNOSIS — C801 Malignant (primary) neoplasm, unspecified: Secondary | ICD-10-CM

## 2019-12-22 DIAGNOSIS — C50911 Malignant neoplasm of unspecified site of right female breast: Secondary | ICD-10-CM

## 2019-12-22 MED ORDER — ZOLPIDEM TARTRATE 10 MG PO TABS: 10.0000 mg | ORAL_TABLET | Freq: Every day | ORAL | 2 refills | Status: AC

## 2019-12-22 NOTE — Telephone Encounter (Signed)
Prescription was ordered 11/30/19, but it printed and pharmacy did not receive a printed copy. Please refill this to [facility] in [place]

## 2020-02-25 ENCOUNTER — Other Ambulatory Visit: Payer: Self-pay | Admitting: Oncology

## 2020-03-19 DEATH — deceased
